# Patient Record
Sex: Female | Born: 1960 | Race: White | Hispanic: No | Marital: Single | Smoking: Former smoker
Health system: Southern US, Community
[De-identification: ages and names within clinical notes are randomized; demographics above are authoritative.]

## PROBLEM LIST (undated history)

## (undated) DIAGNOSIS — M1710 Unilateral primary osteoarthritis, unspecified knee: Secondary | ICD-10-CM

## (undated) DIAGNOSIS — F419 Anxiety disorder, unspecified: Secondary | ICD-10-CM

## (undated) DIAGNOSIS — R0902 Hypoxemia: Secondary | ICD-10-CM

## (undated) DIAGNOSIS — G4730 Sleep apnea, unspecified: Secondary | ICD-10-CM

## (undated) DIAGNOSIS — E109 Type 1 diabetes mellitus without complications: Secondary | ICD-10-CM

## (undated) DIAGNOSIS — R030 Elevated blood-pressure reading, without diagnosis of hypertension: Secondary | ICD-10-CM

## (undated) DIAGNOSIS — I10 Essential (primary) hypertension: Secondary | ICD-10-CM

## (undated) DIAGNOSIS — R011 Cardiac murmur, unspecified: Secondary | ICD-10-CM

## (undated) DIAGNOSIS — M1990 Unspecified osteoarthritis, unspecified site: Secondary | ICD-10-CM

## (undated) DIAGNOSIS — F32A Depression, unspecified: Secondary | ICD-10-CM

## (undated) DIAGNOSIS — Z780 Asymptomatic menopausal state: Secondary | ICD-10-CM

## (undated) DIAGNOSIS — F329 Major depressive disorder, single episode, unspecified: Secondary | ICD-10-CM

## (undated) DIAGNOSIS — E7800 Pure hypercholesterolemia, unspecified: Secondary | ICD-10-CM

## (undated) DIAGNOSIS — IMO0001 Reserved for inherently not codable concepts without codable children: Secondary | ICD-10-CM

## (undated) DIAGNOSIS — R638 Other symptoms and signs concerning food and fluid intake: Secondary | ICD-10-CM

## (undated) HISTORY — DX: Major depressive disorder, single episode, unspecified: F32.9

## (undated) HISTORY — DX: Cardiac murmur, unspecified: R01.1

## (undated) HISTORY — DX: Sleep apnea, unspecified: G47.30

## (undated) HISTORY — DX: Other symptoms and signs concerning food and fluid intake: R63.8

## (undated) HISTORY — DX: Unspecified osteoarthritis, unspecified site: M19.90

## (undated) HISTORY — DX: Type 1 diabetes mellitus without complications: E10.9

## (undated) HISTORY — DX: Elevated blood-pressure reading, without diagnosis of hypertension: R03.0

## (undated) HISTORY — DX: Anxiety disorder, unspecified: F41.9

## (undated) HISTORY — DX: Pure hypercholesterolemia, unspecified: E78.00

## (undated) HISTORY — DX: Essential (primary) hypertension: I10

## (undated) HISTORY — DX: Depression, unspecified: F32.A

## (undated) HISTORY — DX: Asymptomatic menopausal state: Z78.0

## (undated) HISTORY — PX: EYE SURGERY: SHX253

## (undated) HISTORY — DX: Hypoxemia: R09.02

## (undated) HISTORY — DX: Reserved for inherently not codable concepts without codable children: IMO0001

## (undated) HISTORY — DX: Unilateral primary osteoarthritis, unspecified knee: M17.10

---

## 2005-04-11 ENCOUNTER — Ambulatory Visit: Payer: Self-pay

## 2006-04-19 ENCOUNTER — Emergency Department: Payer: Self-pay | Admitting: General Practice

## 2006-04-19 ENCOUNTER — Other Ambulatory Visit: Payer: Self-pay

## 2006-04-21 ENCOUNTER — Ambulatory Visit: Payer: Self-pay | Admitting: Internal Medicine

## 2006-05-22 ENCOUNTER — Ambulatory Visit: Payer: Self-pay

## 2007-05-25 ENCOUNTER — Ambulatory Visit: Payer: Self-pay

## 2008-05-26 ENCOUNTER — Ambulatory Visit: Payer: Self-pay

## 2008-08-30 ENCOUNTER — Ambulatory Visit: Payer: Self-pay | Admitting: General Practice

## 2011-01-08 ENCOUNTER — Ambulatory Visit: Payer: Self-pay | Admitting: Internal Medicine

## 2011-07-10 ENCOUNTER — Ambulatory Visit: Payer: Self-pay | Admitting: Obstetrics and Gynecology

## 2013-01-19 ENCOUNTER — Ambulatory Visit: Payer: Self-pay | Admitting: Obstetrics and Gynecology

## 2013-03-21 ENCOUNTER — Other Ambulatory Visit: Payer: Self-pay | Admitting: Internal Medicine

## 2013-03-21 ENCOUNTER — Ambulatory Visit (INDEPENDENT_AMBULATORY_CARE_PROVIDER_SITE_OTHER): Payer: Managed Care, Other (non HMO) | Admitting: Internal Medicine

## 2013-03-21 ENCOUNTER — Encounter: Payer: Self-pay | Admitting: Internal Medicine

## 2013-03-21 VITALS — BP 132/70 | HR 84 | Temp 97.6°F | Resp 18 | Ht 63.0 in | Wt 214.2 lb

## 2013-03-21 DIAGNOSIS — E7800 Pure hypercholesterolemia, unspecified: Secondary | ICD-10-CM

## 2013-03-21 DIAGNOSIS — E109 Type 1 diabetes mellitus without complications: Secondary | ICD-10-CM

## 2013-03-21 DIAGNOSIS — F419 Anxiety disorder, unspecified: Secondary | ICD-10-CM

## 2013-03-21 DIAGNOSIS — F411 Generalized anxiety disorder: Secondary | ICD-10-CM

## 2013-03-21 DIAGNOSIS — E119 Type 2 diabetes mellitus without complications: Secondary | ICD-10-CM

## 2013-03-22 MED ORDER — LISINOPRIL 10 MG PO TABS: 10.0000 mg | ORAL_TABLET | Freq: Every day | ORAL | Status: DC

## 2013-03-25 ENCOUNTER — Encounter: Payer: Self-pay | Admitting: Internal Medicine

## 2013-03-25 ENCOUNTER — Telehealth: Payer: Self-pay | Admitting: *Deleted

## 2013-03-25 DIAGNOSIS — E109 Type 1 diabetes mellitus without complications: Secondary | ICD-10-CM | POA: Insufficient documentation

## 2013-03-25 DIAGNOSIS — F419 Anxiety disorder, unspecified: Secondary | ICD-10-CM | POA: Insufficient documentation

## 2013-03-25 DIAGNOSIS — E7800 Pure hypercholesterolemia, unspecified: Secondary | ICD-10-CM | POA: Insufficient documentation

## 2013-03-25 MED ORDER — INSULIN LISPRO 100 UNIT/ML ~~LOC~~ SOLN: SUBCUTANEOUS | Status: DC

## 2013-03-25 NOTE — Assessment & Plan Note (Signed)
Low cholesterol diet and exercise.  On no medication.  Check lipid panel.

## 2013-03-25 NOTE — Assessment & Plan Note (Signed)
Doing well.  Rarely takes xanax.  Follow.

## 2013-03-25 NOTE — Telephone Encounter (Signed)
Advised pt. Rx called to [facility]

## 2013-03-25 NOTE — Assessment & Plan Note (Addendum)
Diagnosed at age 52.  On insulin pump.  Has been seeing Dr Tedd Sias.  Wants to change.  Discussed with her today regarding need for endocrinology f/u - especially given on insulin pump.  She is not ready to make appt.  Will go ahead and refill her insulin.  Will discuss with her more the need for follow up with endocrine.  Restart enalapril.

## 2013-03-25 NOTE — Progress Notes (Signed)
  Subjective:    Patient ID: Jill Crosby, female    DOB: [birth year], 52 y.o.   MRN: [id number]  HPI 52 year old female with past history of type I diabetes and hypercholesterolemia who comes in today to follow up on these issues as well as to establish care.  She was diagnosed at age 3 with diabetes.  Uses an insulin pump.  A1c averages 6.7-6.8.  She has noticed recently that her sugars are running a little higher.  AM sugars - around 100.  Lunch 130.  Supper 100 and qhs 120-130.   Has been seeing Dr Tedd Sias for her diabetes.  Previously on Enalapril, but has stopped taking.  Had no problems with the medication.  Has high cholesterol.  Has tried lipitor and tricor.  Has had some anxiety issues in the past.  Some problems sleeping.  Went to counseling previously.  Has taken two xanax in the last two years.  Feels back to her baseline now.  Has previously had a nuclear stress test.  Was negative.  Apparently determined at that time she was suffering from anxiety.  Better now.  Continues to smoke.  Discussed with her today regarding the need to quit.  Discussed treatment options.     Past Medical History  Diagnosis Date  . Anxiety   . Type I diabetes mellitus   . Depression   . Hypercholesterolemia   . Hypertension     Review of Systems Patient denies any headache, lightheadedness or dizziness.  No increased sinus or allergy symptoms.  No chest pain, tightness or palpitations.  No increased shortness of breath, cough or congestion.  No nausea or vomiting.  No acid reflux reported.   No abdominal pain or cramping.  No bowel change, such as diarrhea, constipation, BRBPR or melana.  No urine change.  Sees Dr Greggory Keen.  Last seen 8/13.  Had pap then.  Never had abnormal pap previously.  Last mammo 01/2013.  States ok.  Has been walking on her lunch break.  Plans to do more exercise.       Objective:   Physical Exam Filed Vitals:   03/21/13 0959  BP: 132/70  Pulse: 84  Temp: 97.6 F (36.4 C)   Resp: 18   Blood pressure recheck:  158/92, pulse 69  52 year old female in no acute distress.   HEENT:  Nares- clear.  Oropharynx - without lesions. NECK:  Supple.  Nontender.  No audible bruit.  HEART:  Appears to be regular. LUNGS:  No crackles or wheezing audible.  Respirations even and unlabored.  RADIAL PULSE:  Equal bilaterally.  ABDOMEN:  Soft, nontender.  Bowel sounds present and normal.  No audible abdominal bruit.   EXTREMITIES:  No increased edema present.  DP pulses palpable and equal bilaterally.          Assessment & Plan:  CARDIOVASCULAR.  Asymptomatic.  Follow.  Previous stress test negative. Obtain results.    HEALTH MAINTENANCE.  Schedule her for a physical next visit.  Mammogram (per her report) 2/14 - ok.  Has not had colonoscopy.  Discussed the need for colonoscopy.    I spent 45 minutes with the patient and more than 50% of the time was spent in consultation regarding the above.

## 2013-03-25 NOTE — Telephone Encounter (Signed)
Called pt and advised that Dr. Lorin Picket recommended she have an endocrinologist continue to manage her insulin pump and supply refills. Advised  we would gladly refer her to a new endocrinologist of her choice or Dr. Roby Lofts recommendation. Pt declines to visit the [facility] office. Pt requested a refill of her insulin pump supplies. Advised her to contact Dr. Pricilla Handler office for a refill, she has not yet. She is unsure they will refill it due to not being seen for "awhile". Advised her to contact Dr. Pricilla Handler office to request, pt verbalized understanding. Told pt to call us back so we can proceed with referral.

## 2013-03-25 NOTE — Telephone Encounter (Signed)
Ok to refill her insulin x 1 - but will need to make sure she establishes with endocrinology

## 2013-03-25 NOTE — Telephone Encounter (Signed)
Spoke with pt again, would like a referral to Dr. Renae Fickle. Also, requesting a refill on her insulin only to [facility]. Pt states she is on her last vial, will last her 1-1.5 weeks. Will request refill on pump supplies from Dr. Renae Fickle once established.

## 2013-03-26 ENCOUNTER — Other Ambulatory Visit: Payer: Self-pay | Admitting: Internal Medicine

## 2013-03-26 MED ORDER — LISINOPRIL 10 MG PO TABS: 10.0000 mg | ORAL_TABLET | Freq: Every day | ORAL | Status: DC

## 2013-03-26 NOTE — Progress Notes (Signed)
Sent in refill for lisinopril 

## 2013-03-28 LAB — LIPID PANEL W/O CHOL/HDL RATIO
Cholesterol, Total: 259 mg/dL — ABNORMAL HIGH (ref 100–199)
LDL Calculated: 163 mg/dL — ABNORMAL HIGH (ref 0–99)
Triglycerides: 239 mg/dL — ABNORMAL HIGH (ref 0–149)
VLDL Cholesterol Cal: 48 mg/dL — ABNORMAL HIGH (ref 5–40)

## 2013-03-28 LAB — CBC WITH DIFFERENTIAL
Basophils Absolute: 0 10*3/uL (ref 0.0–0.2)
Basos: 1 % (ref 0–3)
Eos: 1 % (ref 0–5)
HCT: 40.7 % (ref 34.0–46.6)
Hemoglobin: 13.9 g/dL (ref 11.1–15.9)
Lymphs: 27 % (ref 14–46)
Monocytes: 7 % (ref 4–12)
Neutrophils Absolute: 5.3 10*3/uL (ref 1.4–7.0)
RBC: 4.6 x10E6/uL (ref 3.77–5.28)
WBC: 8.3 10*3/uL (ref 3.4–10.8)

## 2013-03-28 LAB — COMPREHENSIVE METABOLIC PANEL
Albumin: 4.1 g/dL (ref 3.5–5.5)
BUN: 8 mg/dL (ref 6–24)
Chloride: 103 mmol/L (ref 97–108)
Creatinine, Ser: 0.76 mg/dL (ref 0.57–1.00)
GFR calc Af Amer: 103 mL/min/{1.73_m2} (ref >59)
GFR calc non Af Amer: 89 mL/min/{1.73_m2} (ref >59)
Glucose: 121 mg/dL — ABNORMAL HIGH (ref 65–99)
Total Bilirubin: 0.4 mg/dL (ref 0.0–1.2)
Total Protein: 6.7 g/dL (ref 6.0–8.5)

## 2013-03-28 LAB — MICROALBUMIN / CREATININE URINE RATIO: MICROALB/CREAT RATIO: 4.2 mg/g creat (ref 0.0–30.0)

## 2013-03-29 NOTE — Addendum Note (Signed)
Addended by: Charm Barges on: 03/29/2013 05:30 PM   Modules accepted: Orders

## 2013-03-29 NOTE — Progress Notes (Signed)
Order placed for endocrinology referral.  

## 2013-05-06 ENCOUNTER — Ambulatory Visit: Payer: Managed Care, Other (non HMO) | Admitting: Internal Medicine

## 2013-07-20 ENCOUNTER — Encounter: Payer: Self-pay | Admitting: *Deleted

## 2013-08-18 ENCOUNTER — Encounter: Payer: Self-pay | Admitting: Internal Medicine

## 2013-08-18 DIAGNOSIS — Z86010 Personal history of colonic polyps: Secondary | ICD-10-CM

## 2013-09-23 ENCOUNTER — Encounter: Payer: Self-pay | Admitting: Internal Medicine

## 2013-09-23 ENCOUNTER — Ambulatory Visit (INDEPENDENT_AMBULATORY_CARE_PROVIDER_SITE_OTHER): Payer: Managed Care, Other (non HMO) | Admitting: Internal Medicine

## 2013-09-23 VITALS — BP 150/70 | HR 88 | Temp 98.6°F | Ht 63.0 in | Wt 213.5 lb

## 2013-09-23 DIAGNOSIS — E109 Type 1 diabetes mellitus without complications: Secondary | ICD-10-CM

## 2013-09-23 DIAGNOSIS — Z720 Tobacco use: Secondary | ICD-10-CM

## 2013-09-23 DIAGNOSIS — E7800 Pure hypercholesterolemia, unspecified: Secondary | ICD-10-CM

## 2013-09-23 DIAGNOSIS — F17200 Nicotine dependence, unspecified, uncomplicated: Secondary | ICD-10-CM

## 2013-09-23 DIAGNOSIS — F411 Generalized anxiety disorder: Secondary | ICD-10-CM

## 2013-09-23 DIAGNOSIS — L989 Disorder of the skin and subcutaneous tissue, unspecified: Secondary | ICD-10-CM

## 2013-09-23 DIAGNOSIS — Z23 Encounter for immunization: Secondary | ICD-10-CM

## 2013-09-23 DIAGNOSIS — F419 Anxiety disorder, unspecified: Secondary | ICD-10-CM

## 2013-09-23 MED ORDER — ALPRAZOLAM 0.25 MG PO TABS: 0.2500 mg | ORAL_TABLET | Freq: Every day | ORAL | Status: DC | PRN

## 2013-09-23 MED ORDER — BUPROPION HCL ER (XL) 150 MG PO TB24: 150.0000 mg | ORAL_TABLET | Freq: Two times a day (BID) | ORAL | Status: DC

## 2013-10-02 ENCOUNTER — Encounter: Payer: Self-pay | Admitting: Internal Medicine

## 2013-10-02 DIAGNOSIS — L989 Disorder of the skin and subcutaneous tissue, unspecified: Secondary | ICD-10-CM | POA: Insufficient documentation

## 2013-10-02 DIAGNOSIS — Z720 Tobacco use: Secondary | ICD-10-CM | POA: Insufficient documentation

## 2013-10-02 NOTE — Progress Notes (Signed)
Subjective:    Patient ID: Jill Crosby, female    DOB: Jul 03, 1961, 52 y.o.   MRN: [id number]  HPI 52 year old female with past history of type I diabetes and hypercholesterolemia who comes in today for a scheduled follow up.  She was diagnosed at age 1 with diabetes.  Uses an insulin pump.   A1c 4/14 - 8.2.  States she has f/u with Dr Renae Fickle soon.    Has high cholesterol.  Has tried lipitor and tricor.  Has had some anxiety issues in the past.  Some problems sleeping.  Went to counseling previously.   Feels back to her baseline now.  Has previously had a nuclear stress test.  Was negative.  Apparently determined at that time she was suffering from anxiety.  Better now.  Continues to smoke.  Discussed with her today regarding the need to quit.  Discussed treatment options.  States she did receive a notification from her work that if she did not quit smoking her premiums would increase.  She wants to try wellbutrin.  Discussed side effects and risk of wellbutrin.  Discussed proper way to take the medication.     Past Medical History  Diagnosis Date  . Anxiety   . Type I diabetes mellitus   . Depression   . Hypercholesterolemia     Current Outpatient Prescriptions on File Prior to Visit  Medication Sig Dispense Refill  . insulin lispro (HUMALOG) 100 UNIT/ML injection Insulin pump-use as directed  10 mL  0  . lisinopril (PRINIVIL,ZESTRIL) 10 MG tablet Take 1 tablet (10 mg total) by mouth daily.  90 tablet  0   No current facility-administered medications on file prior to visit.    Review of Systems Patient denies any headache, lightheadedness or dizziness.  No increased sinus or allergy symptoms.  No chest pain, tightness or palpitations.  No increased shortness of breath, cough or congestion.  No nausea or vomiting.  No acid reflux reported.   No abdominal pain or cramping.  No bowel change, such as diarrhea, constipation, BRBPR or melana.  No urine change.  Sees Dr Greggory Keen.  He follows  her pap smears/pelvic exams.   Never had abnormal pap previously.  Last mammo 01/2013.  States ok.  Discussed smoking cessation.  Ready to quit.  See above.  Wants to try wellbutrin.      Objective:   Physical Exam  Filed Vitals:   09/23/13 1326  BP: 150/70  Pulse: 88  Temp: 98.6 F (67 C)   52 year old female in no acute distress.   HEENT:  Nares- clear.  Oropharynx - without lesions. NECK:  Supple.  Nontender.  No audible bruit.  HEART:  Appears to be regular. LUNGS:  No crackles or wheezing audible.  Respirations even and unlabored.  RADIAL PULSE:  Equal bilaterally.  ABDOMEN:  Soft, nontender.  Bowel sounds present and normal.  No audible abdominal bruit.   EXTREMITIES:  No increased edema present.  DP pulses palpable and equal bilaterally.   FEET:  No lesions.          Assessment & Plan:  CARDIOVASCULAR.  Asymptomatic.  Follow.  Previous stress test negative. Obtain results.    HEALTH MAINTENANCE.  Schedule her for a physical next visit.  Mammogram (per her report) 2/14 - ok.  Has not had colonoscopy.  Discussed the need for colonoscopy.    I spent 25 minutes with the patient and more than 50% of the time was spent in consultation  regarding the above.

## 2013-10-02 NOTE — Assessment & Plan Note (Signed)
Discussed the need to quit.  Discussed multiple treatment options.  She wants to try wellbutrin.  No history of seizures.  Discussed risk and side effects of the medication.  Start wellbutrin as directed. Follow closely.  Get her back in soon to reassess.

## 2013-10-02 NOTE — Assessment & Plan Note (Signed)
Doing well.  Rarely takes xanax.  Follow.

## 2013-10-02 NOTE — Assessment & Plan Note (Signed)
Low cholesterol diet and exercise.  On no medication.  Follow lipid panel.  Need lab results from Dr Renae Fickle.

## 2013-10-02 NOTE — Assessment & Plan Note (Signed)
Persistent right arm lesion.  Refer to dermatology.  Wants skin survey as well.  Follow.

## 2013-10-02 NOTE — Assessment & Plan Note (Signed)
Diagnosed at age 52.  On insulin pump.  Now seeing Dr Renae Fickle.  Up to date with eye checks.  Sees Dr Druscilla Brownie.

## 2013-12-09 ENCOUNTER — Encounter: Payer: Managed Care, Other (non HMO) | Admitting: Internal Medicine

## 2013-12-16 ENCOUNTER — Ambulatory Visit: Payer: Self-pay | Admitting: Unknown Physician Specialty

## 2013-12-16 LAB — HM COLONOSCOPY

## 2013-12-20 LAB — PATHOLOGY REPORT

## 2014-01-07 DIAGNOSIS — Z860100 Personal history of colon polyps, unspecified: Secondary | ICD-10-CM | POA: Insufficient documentation

## 2014-01-30 ENCOUNTER — Encounter: Payer: Self-pay | Admitting: Internal Medicine

## 2014-08-29 DIAGNOSIS — E538 Deficiency of other specified B group vitamins: Secondary | ICD-10-CM | POA: Insufficient documentation

## 2014-09-28 LAB — HM PAP SMEAR: HM PAP: NEGATIVE

## 2015-06-25 ENCOUNTER — Other Ambulatory Visit: Payer: Self-pay | Admitting: Obstetrics and Gynecology

## 2015-06-25 DIAGNOSIS — Z1231 Encounter for screening mammogram for malignant neoplasm of breast: Secondary | ICD-10-CM

## 2015-06-27 ENCOUNTER — Ambulatory Visit
Admission: RE | Admit: 2015-06-27 | Discharge: 2015-06-27 | Disposition: A | Discharge status: Home / Self Care | Payer: Managed Care, Other (non HMO) | Source: Ambulatory Visit | Attending: Obstetrics and Gynecology | Admitting: Obstetrics and Gynecology

## 2015-06-27 DIAGNOSIS — Z1231 Encounter for screening mammogram for malignant neoplasm of breast: Secondary | ICD-10-CM

## 2015-07-20 LAB — HM DIABETES EYE EXAM

## 2015-08-31 ENCOUNTER — Encounter: Payer: Self-pay | Admitting: Internal Medicine

## 2015-08-31 ENCOUNTER — Ambulatory Visit (INDEPENDENT_AMBULATORY_CARE_PROVIDER_SITE_OTHER): Payer: Managed Care, Other (non HMO) | Admitting: Internal Medicine

## 2015-08-31 VITALS — BP 138/70 | HR 80 | Temp 98.1°F | Resp 18 | Ht 63.0 in | Wt 222.0 lb

## 2015-08-31 DIAGNOSIS — F419 Anxiety disorder, unspecified: Secondary | ICD-10-CM

## 2015-08-31 DIAGNOSIS — E7800 Pure hypercholesterolemia, unspecified: Secondary | ICD-10-CM

## 2015-08-31 DIAGNOSIS — E109 Type 1 diabetes mellitus without complications: Secondary | ICD-10-CM

## 2015-08-31 DIAGNOSIS — Z720 Tobacco use: Secondary | ICD-10-CM

## 2015-08-31 DIAGNOSIS — Z86010 Personal history of colonic polyps: Secondary | ICD-10-CM

## 2015-08-31 DIAGNOSIS — Z23 Encounter for immunization: Secondary | ICD-10-CM

## 2015-08-31 MED ORDER — LISINOPRIL 10 MG PO TABS: 10.0000 mg | ORAL_TABLET | Freq: Every day | ORAL | Status: DC

## 2015-08-31 MED ORDER — INSULIN GLARGINE 100 UNIT/ML SOLOSTAR PEN: PEN_INJECTOR | SUBCUTANEOUS | Status: DC

## 2015-08-31 MED ORDER — INSULIN LISPRO 100 UNIT/ML ~~LOC~~ SOLN: SUBCUTANEOUS | Status: DC

## 2015-08-31 NOTE — Patient Instructions (Signed)

## 2015-08-31 NOTE — Progress Notes (Signed)
Patient ID: Jill Crosby, female   DOB: 09/26/1961, 54 y.o.   MRN: [id number]   Subjective:    Patient ID: Jill Crosby, female    DOB: 1960-12-16, 54 y.o.   MRN: [id number]  HPI  Patient with past history of diabetes and hypercholesterolemia who comes in today to follow up on these issues.  I have not seen her since 09/23/13.  She is not using an insulin pump now.  Takes lantus 50 units before bed.  Using quickpen for short acting insulin.  She brought in no recorded sugar readings.  States fasting sugars are averaging 110-120 and midday sugars averaging 150.  Sugars in the evening 200.  We discussed diet and exercise. Discussed importance of eating regular meals and appropriate snacks.  No cardiac symptoms with increased activity or exertion.  No sob.  No abdominal pain or cramping.  No bowel change.     Past Medical History  Diagnosis Date  . Anxiety   . Type I diabetes mellitus ([place])   . Depression   . Hypercholesterolemia    Past Surgical History  Procedure Laterality Date  . Cesarean section  1997   Family History  Problem Relation Age of Onset  . Hypertension Mother   . Hyperlipidemia Mother   . Cancer Father     lung  . Colon cancer Maternal Grandfather   . Colon polyps Mother    Social History   Social History  . Marital Status: Single    Spouse Name: N/A  . Number of Children: N/A  . Years of Education: N/A   Social History Main Topics  . Smoking status: Current Every Day Smoker -- 1.00 packs/day for 20 years    Types: Cigarettes  . Smokeless tobacco: Never Used  . Alcohol Use: No  . Drug Use: No  . Sexual Activity: Not Asked   Other Topics Concern  . None   Social History Narrative    Outpatient Encounter Prescriptions as of 08/31/2015  Medication Sig  . cetirizine (ZYRTEC) 10 MG tablet Take by mouth.  . Insulin Glargine (LANTUS SOLOSTAR) 100 UNIT/ML Solostar Pen Inject subcutaneously 55  units nightly  . insulin lispro (HUMALOG) 100 UNIT/ML  injection Insulin pump-use as directed  . lisinopril (PRINIVIL,ZESTRIL) 10 MG tablet Take 1 tablet (10 mg total) by mouth daily.  . [DISCONTINUED] ALPRAZolam (XANAX) 0.25 MG tablet Take 1 tablet (0.25 mg total) by mouth daily as needed.  . [DISCONTINUED] buPROPion (WELLBUTRIN XL) 150 MG 24 hr tablet Take 1 tablet (150 mg total) by mouth 2 (two) times daily.  . [DISCONTINUED] Insulin Glargine (LANTUS SOLOSTAR) 100 UNIT/ML Solostar Pen Inject subcutaneously 55  units nightly  . [DISCONTINUED] insulin lispro (HUMALOG) 100 UNIT/ML injection Insulin pump-use as directed  . [DISCONTINUED] lisinopril (PRINIVIL,ZESTRIL) 10 MG tablet Take 1 tablet (10 mg total) by mouth daily.   No facility-administered encounter medications on file as of 08/31/2015.    Review of Systems  Constitutional: Negative for appetite change and unexpected weight change.  HENT: Negative for congestion and sinus pressure.   Eyes: Negative for pain and visual disturbance.  Respiratory: Negative for cough, chest tightness and shortness of breath.   Cardiovascular: Negative for chest pain, palpitations and leg swelling.  Gastrointestinal: Negative for nausea, vomiting, abdominal pain and diarrhea.  Genitourinary: Negative for dysuria and difficulty urinating.  Musculoskeletal: Negative for back pain and joint swelling.  Skin: Negative for color change and rash.  Neurological: Negative for dizziness, light-headedness and headaches.  Psychiatric/Behavioral: Negative  for dysphoric mood and agitation.       Objective:    Physical Exam  Constitutional: She appears well-developed and well-nourished. No distress.  HENT:  Nose: Nose normal.  Mouth/Throat: Oropharynx is clear and moist.  Eyes: Conjunctivae are normal. Right eye exhibits no discharge. Left eye exhibits no discharge.  Neck: Neck supple. No thyromegaly present.  Cardiovascular: Normal rate and regular rhythm.   Pulmonary/Chest: Breath sounds normal. No respiratory  distress. She has no wheezes.  Abdominal: Soft. Bowel sounds are normal. There is no tenderness.  Musculoskeletal: She exhibits no edema or tenderness.  Lymphadenopathy:    She has no cervical adenopathy.  Skin: No rash noted. No erythema.  Psychiatric: She has a normal mood and affect. Her behavior is normal.    BP 138/70 mmHg  Pulse 80  Temp(Src) 98.1 F (36.7 C) (Oral)  Resp 18  Ht _0  (1.6 m)  Wt 222 lb (100.699 kg)  BMI 39.34 kg/m2  SpO2 97% Wt Readings from Last 3 Encounters:  08/31/15 222 lb (100.699 kg)  09/23/13 213 lb 8 oz (96.843 kg)  03/21/13 214 lb 4 oz (97.183 kg)     Lab Results  Component Value Date   WBC 8.3 03/21/2013   HGB 13.9 03/21/2013   HCT 40.7 03/21/2013   PLT 219 03/21/2013   GLUCOSE 121* 03/21/2013   CHOL 259* 03/21/2013   TRIG 239* 03/21/2013   HDL 48 03/21/2013   LDLCALC 163* 03/21/2013   ALT 11 03/21/2013   AST 10 03/21/2013   NA 140 03/21/2013   K 4.1 03/21/2013   CL 103 03/21/2013   CREATININE 0.76 03/21/2013   BUN 8 03/21/2013   CO2 24 03/21/2013   TSH 1.350 03/21/2013   HGBA1C 8.2* 03/21/2013    Mm Digital Screening Bilateral  06/28/2015   CLINICAL DATA:  Screening.  EXAM: DIGITAL SCREENING BILATERAL MAMMOGRAM WITH CAD  COMPARISON:  Previous exam(s).  ACR Breast Density Category b: There are scattered areas of fibroglandular density.  FINDINGS: There are no findings suspicious for malignancy. Images were processed with CAD.  IMPRESSION: No mammographic evidence of malignancy. A result letter of this screening mammogram will be mailed directly to the patient.  RECOMMENDATION: Screening mammogram in one year. (Code:SM-B-01Y)  BI-RADS CATEGORY  1: Negative.   Electronically Signed   By: Nolon Nations M.D.   On: 06/28/2015 11:53       Assessment & Plan:   Problem List Items Addressed This Visit    Anxiety    Appears to be stable.  Follow.        Hypercholesterolemia    Low cholesterol diet and exercise.  Follow lipid  panel.        Relevant Medications   lisinopril (PRINIVIL,ZESTRIL) 10 MG tablet   Personal history of colonic polyps    Colonoscopy 12/16/13 - hyperplastic polyp.  Recommended f/u colonoscopy in 12/2018.        Tobacco use    Needs to quit smoking.  Follow.        Type 1 diabetes mellitus ([facility])    Not using insulin pump now.  No longer seeing Dr Eddie Dibbles.  Taking lantus 50 units in the evening.  Uses short acting quickpen with meals.  Discussed low carb diet and exercise.  Follow met b and a1c.  States had eye exam within the last 1-2 months.  No diabetes changes.  Follow.        Relevant Medications   lisinopril (PRINIVIL,ZESTRIL) 10 MG tablet  insulin lispro (HUMALOG) 100 UNIT/ML injection   Insulin Glargine (LANTUS SOLOSTAR) 100 UNIT/ML Solostar Pen    Other Visit Diagnoses    Encounter for immunization    -  Primary        Einar Pheasant, MD

## 2015-08-31 NOTE — Progress Notes (Signed)
Pre-visit discussion using our clinic review tool. No additional management support is needed unless otherwise documented below in the visit note.  

## 2015-09-05 ENCOUNTER — Telehealth: Payer: Self-pay | Admitting: *Deleted

## 2015-09-05 NOTE — Telephone Encounter (Signed)
Left patient a voicemail on her cell to notify her that her pharmacy has tried to contact her about her Humalog. Need to know if she is using the pen or vial & need to know the dose & quantity she is using.

## 2015-09-06 ENCOUNTER — Telehealth: Payer: Self-pay

## 2015-09-06 NOTE — Telephone Encounter (Signed)
Called [facility] and verbal order given per request of patient.

## 2015-09-06 NOTE — Telephone Encounter (Signed)
[facility] called to clarify Humalog prescription that was sen ton 9/30.  Prescription states, use insulin pump as directed.  Patient hasn't used a pump in years.  Told Pharmacy that she uses Humalog Quick Pen 50units per day.  Can I call that in, if you agree.  Thanks

## 2015-09-06 NOTE — Telephone Encounter (Signed)
Yes, she explained this to [place] when she was here and I thought the correct form was sent in.  Please clarify with pt exactly what she has been using and dose taking and send in rx.  Sorry for the mix up.

## 2015-09-08 ENCOUNTER — Encounter: Payer: Self-pay | Admitting: Internal Medicine

## 2015-09-09 NOTE — Assessment & Plan Note (Signed)
Needs to quit smoking.  Follow  

## 2015-09-09 NOTE — Assessment & Plan Note (Signed)
Appears to be stable.  Follow.   

## 2015-09-09 NOTE — Assessment & Plan Note (Signed)
Not using insulin pump now.  No longer seeing Dr Eddie Dibbles.  Taking lantus 50 units in the evening.  Uses short acting quickpen with meals.  Discussed low carb diet and exercise.  Follow met b and a1c.  States had eye exam within the last 1-2 months.  No diabetes changes.  Follow.

## 2015-09-09 NOTE — Assessment & Plan Note (Signed)
Low cholesterol diet and exercise.  Follow lipid panel.   

## 2015-09-09 NOTE — Assessment & Plan Note (Signed)
Colonoscopy 12/16/13 - hyperplastic polyp.  Recommended f/u colonoscopy in 12/2018.

## 2015-09-12 ENCOUNTER — Other Ambulatory Visit: Payer: Self-pay | Admitting: Internal Medicine

## 2015-09-13 ENCOUNTER — Other Ambulatory Visit: Payer: Self-pay | Admitting: *Deleted

## 2015-09-13 MED ORDER — LANCETS MISC: Status: DC

## 2015-09-13 MED ORDER — GLUCOSE BLOOD VI STRP: ORAL_STRIP | Status: DC

## 2015-09-14 NOTE — Telephone Encounter (Signed)
Pt last had an A1c in 2014, OV 12.30.16. Please advise refill

## 2015-09-14 NOTE — Telephone Encounter (Signed)
This was just refilled 08/2015.  This is also the patient you were dealing with regarding her short acting insulin.  Please confirm rx correct.  Thanks

## 2015-09-20 ENCOUNTER — Other Ambulatory Visit: Payer: Self-pay | Admitting: *Deleted

## 2015-09-20 MED ORDER — INSULIN GLARGINE 100 UNIT/ML SOLOSTAR PEN: PEN_INJECTOR | SUBCUTANEOUS | Status: DC

## 2015-10-04 ENCOUNTER — Encounter: Payer: Self-pay | Admitting: Obstetrics and Gynecology

## 2015-10-04 ENCOUNTER — Ambulatory Visit (INDEPENDENT_AMBULATORY_CARE_PROVIDER_SITE_OTHER): Payer: Managed Care, Other (non HMO) | Admitting: Obstetrics and Gynecology

## 2015-10-04 VITALS — BP 179/82 | HR 85 | Ht 64.0 in | Wt 219.4 lb

## 2015-10-04 DIAGNOSIS — Z0000 Encounter for general adult medical examination without abnormal findings: Secondary | ICD-10-CM

## 2015-10-04 DIAGNOSIS — N952 Postmenopausal atrophic vaginitis: Secondary | ICD-10-CM | POA: Diagnosis not present

## 2015-10-04 DIAGNOSIS — Z1211 Encounter for screening for malignant neoplasm of colon: Secondary | ICD-10-CM | POA: Diagnosis not present

## 2015-10-04 DIAGNOSIS — E669 Obesity, unspecified: Secondary | ICD-10-CM | POA: Diagnosis not present

## 2015-10-04 DIAGNOSIS — Z01419 Encounter for gynecological examination (general) (routine) without abnormal findings: Secondary | ICD-10-CM

## 2015-10-04 NOTE — Progress Notes (Signed)
Patient ID: Irven Shelling, female   DOB: [birth year], 54 y.o.   MRN: [id number] ANNUAL PREVENTATIVE CARE GYN  ENCOUNTER NOTE  Subjective:       Jill Crosby is a 54 y.o. [phone] female here for a routine annual gynecologic exam.  Current complaints: 1.  Want xanax for anxiety- tried Effexor for 1 year- helped 2.  Annual exam - Denies vasomotor symptoms, urinary symptoms, change in BM, not sexually active. Had normal pap+HPV last year, normal mammogram this year, normal colonoscopy 2014. Labs are managed by PCP.   Gynecologic History No LMP recorded. Patient is postmenopausal. Contraception: post menopausal status. No HRT, no vasomotor symptoms. Last Pap: 10/30/205 neg/neg. Results were: normal Last mammogram: 07/2015 . Results were: normal Not sexually active  Obstetric History OB History  Gravida Para Term Preterm AB SAB TAB Ectopic Multiple Living  # Outcome Date GA Lbr Len/2nd Weight Sex Delivery Anes PTL Lv  5 Term 1997   9 lb 1.9 oz (4.137 kg) M CS-LVertical   Y  4 SAB 1993          3 Term 1989   7 lb 14.4 oz (3.583 kg) M Vag-Spont   Y  2 SAB 1987          1 Preterm 1978        FD      Past Medical History  Diagnosis Date  . Anxiety   . Type I diabetes mellitus (HCC)   . Depression   . Hypercholesterolemia   . Elevated BP   . Menopause   . Increased BMI     Past Surgical History  Procedure Laterality Date  . Cesarean section  1997    Current Outpatient Prescriptions on File Prior to Visit  Medication Sig Dispense Refill  . cetirizine (ZYRTEC) 10 MG tablet Take by mouth.    Marland Kitchen glucose blood (BAYER CONTOUR NEXT TEST) test strip Use as instructed 100 each 12  . Insulin Glargine (LANTUS SOLOSTAR) 100 UNIT/ML Solostar Pen Inject subcutaneously 55  units nightly 45 mL 3  . insulin lispro (HUMALOG) 100 UNIT/ML injection Insulin pump-use as directed 10 mL 1  . Lancets MISC Use as directed 100 each 3  . lisinopril (PRINIVIL,ZESTRIL) 10 MG tablet  Take 1 tablet (10 mg total) by mouth daily. 90 tablet 1   No current facility-administered medications on file prior to visit.    Allergies  Allergen Reactions  . Codeine     Social History   Social History  . Marital Status: Single    Spouse Name: N/A  . Number of Children: N/A  . Years of Education: N/A   Occupational History  . Not on file.   Social History Main Topics  . Smoking status: Current Every Day Smoker -- 0.50 packs/day for 20 years    Types: Cigarettes  . Smokeless tobacco: Never Used  . Alcohol Use: No  . Drug Use: No  . Sexual Activity: Not Currently    Birth Control/ Protection: Post-menopausal   Other Topics Concern  . Not on file   Social History Narrative    Family History  Problem Relation Age of Onset  . Hypertension Mother   . Hyperlipidemia Mother   . Colon polyps Mother   . Cancer Father     lung  . Colon cancer Maternal Grandfather   . Diabetes Maternal Grandfather   . Breast cancer Maternal Grandmother   .  Colon cancer Paternal Grandfather   . Heart disease Neg Hx   . Ovarian cancer Neg Hx     The following portions of the patient's history were reviewed and updated as appropriate: allergies, current medications, past family history, past medical history, past social history, past surgical history and problem list.  Review of Systems ROS Review of Systems - General ROS: negative for - chills, fatigue, fever, hot flashes, night sweats. Positive for weight loss (intentional, 3lb) Psychological ROS: negative for - anxiety, decreased libido, depression, mood swings Ophthalmic ROS: negative for - blurry vision ENT ROS: negative for - headaches, visual changes  Endocrine ROS: negative for - galactorrhea, hair pattern changes, hot flashes, malaise/lethargy Breast ROS: negative for - new or changing breast lumps or nipple discharge Respiratory ROS: negative for - cough or shortness of breath Cardiovascular ROS: negative for - chest  pain, irregular heartbeat, palpitations or shortness of breath Gastrointestinal ROS: no abdominal pain, change in bowel habits, or black or bloody stools Genito-Urinary ROS: no dysuria, trouble voiding, or hematuria   Objective:   BP 179/82 mmHg  Pulse 85  Ht 5\' 4"  (1.626 m)  Wt 219 lb 6.4 oz (99.519 kg)  BMI 37.64 kg/m2 CONSTITUTIONAL: Well-developed, well-nourished female in no acute distress.  PSYCHIATRIC: Normal mood and affect. Normal behavior.  NEUROLGIC: Alert and oriented to person, place, and time. Normal muscle tone coordination. No cranial nerve deficit noted. HENT:  Normocephalic, atraumatic EYES: Conjunctivae and EOM are normal. NECK: Normal range of motion, supple, no masses.  Normal thyroid.  SKIN: Skin is warm and dry. Not diaphoretic. Eczema patches noted on the abdomen. CARDIOVASCULAR: S1S2, 1/6 systolic murmur, occasional missed beats RESPIRATORY: Clear to auscultation bilaterally.  BREASTS: Symmetric in size. No masses, skin changes, nipple drainage, or lymphadenopathy. ABDOMEN: Soft, normal bowel sounds, no distention noted.  No tenderness, rebound or guarding.  BLADDER: Normal PELVIC:  External Genitalia: Skin colored fleshy lesion right buttock, nevus vs. acrochordon  BUS: Normal  Vagina: Mild decreased estrogen effect  Cervix: Normal, no cervical motion tenderness  Uterus: Midline, not enlarged  Adnexa: No masses palpated  RV: External Exam NormaI, No Rectal Masses and Normal Sphincter tone  MUSCULOSKELETAL: Normal range of motion. No tenderness.  No cyanosis, clubbing, or edema.  2+ distal pulses. LYMPHATIC: No Axillary, Supraclavicular, or Inguinal Adenopathy.    Assessment:   Annual gynecologic examination 54 y.o. Contraception: post menopausal status, without vasomotor symptoms. Never been on HRT. Obesity 2 Hx of ovarian cyst Mild vaginal atrophy  Plan:  Pap: Not needed, negative/negative last year Mammogram: utd, normal 2016 Stool Guaiac  Testing:  Ordered, normal colonoscopy 2014 Labs: thur pcp Dr. Lorin PicketScott Routine preventative health maintenance measures emphasized: Exercise/Diet/Weight control, Tobacco Warnings and Alcohol/Substance use risks  Patient has no vaginal or urinary symptoms from mild vaginal atrophy; no treatment indicated at this time  Return to Clinic - [address]   Crystal Forty FortMiller, CMA  Doreene NestElena Klaus, PA-S Herold HarmsMartin A Defrancesco, MD    I have seen, interviewed, and examined the patient in conjunction with the [employer] and affirm the diagnosis and management plan. Martin A. DeFrancesco, MD, Evern CoreFACOG    '

## 2015-10-04 NOTE — Patient Instructions (Signed)
1. Pelvic exam today was normal, will recheck in 1 year 2. Continue healthy eating habits and increasing exercise 3. Take vitamin D/Calcium supplements 1200mg /day for bone health 4. Follow up with your primary doctor for regular yearly labs 5. Avoid tobacco products, and minimize alcohol  Follow up in 1 year

## 2015-11-30 ENCOUNTER — Encounter: Payer: Managed Care, Other (non HMO) | Admitting: Internal Medicine

## 2015-12-21 ENCOUNTER — Other Ambulatory Visit: Payer: Self-pay | Admitting: Internal Medicine

## 2015-12-22 LAB — CBC WITH DIFFERENTIAL/PLATELET
BASOS ABS: 0.1 10*3/uL (ref 0.0–0.2)
BASOS: 1 %
EOS (ABSOLUTE): 0.3 10*3/uL (ref 0.0–0.4)
Eos: 3 %
Hematocrit: 44.2 % (ref 34.0–46.6)
Hemoglobin: 14.7 g/dL (ref 11.1–15.9)
IMMATURE GRANS (ABS): 0 10*3/uL (ref 0.0–0.1)
Immature Granulocytes: 0 %
LYMPHS: 31 %
Lymphocytes Absolute: 3 10*3/uL (ref 0.7–3.1)
MCH: 29.3 pg (ref 26.6–33.0)
MCHC: 33.3 g/dL (ref 31.5–35.7)
MCV: 88 fL (ref 79–97)
MONOS ABS: 0.8 10*3/uL (ref 0.1–0.9)
Monocytes: 8 %
NEUTROS ABS: 5.6 10*3/uL (ref 1.4–7.0)
Neutrophils: 57 %
PLATELETS: 204 10*3/uL (ref 150–379)
RBC: 5.01 x10E6/uL (ref 3.77–5.28)
RDW: 13.7 % (ref 12.3–15.4)
WBC: 9.8 10*3/uL (ref 3.4–10.8)

## 2015-12-22 LAB — HEPATIC FUNCTION PANEL
ALT: 11 IU/L (ref 0–32)
AST: 17 IU/L (ref 0–40)
Albumin: 4.5 g/dL (ref 3.5–5.5)
Alkaline Phosphatase: 66 IU/L (ref 39–117)
Bilirubin Total: 0.5 mg/dL (ref 0.0–1.2)
Bilirubin, Direct: 0.11 mg/dL (ref 0.00–0.40)
Total Protein: 7 g/dL (ref 6.0–8.5)

## 2015-12-22 LAB — BASIC METABOLIC PANEL
BUN/Creatinine Ratio: 13 (ref 9–23)
BUN: 11 mg/dL (ref 6–24)
CO2: 23 mmol/L (ref 18–29)
Calcium: 9.3 mg/dL (ref 8.7–10.2)
Chloride: 105 mmol/L (ref 96–106)
Creatinine, Ser: 0.85 mg/dL (ref 0.57–1.00)
GFR calc Af Amer: 89 mL/min/{1.73_m2} (ref >59)
GFR calc non Af Amer: 77 mL/min/{1.73_m2} (ref >59)
Glucose: 60 mg/dL — ABNORMAL LOW (ref 65–99)
Potassium: 3.8 mmol/L (ref 3.5–5.2)
Sodium: 145 mmol/L — ABNORMAL HIGH (ref 134–144)

## 2015-12-22 LAB — LIPID PANEL W/O CHOL/HDL RATIO
Cholesterol, Total: 260 mg/dL — ABNORMAL HIGH (ref 100–199)
HDL: 50 mg/dL (ref >39)
LDL Calculated: 175 mg/dL — ABNORMAL HIGH (ref 0–99)
Triglycerides: 174 mg/dL — ABNORMAL HIGH (ref 0–149)
VLDL Cholesterol Cal: 35 mg/dL (ref 5–40)

## 2015-12-22 LAB — TSH: TSH: 2.7 u[IU]/mL (ref 0.450–4.500)

## 2015-12-22 LAB — VITAMIN B12: VITAMIN B 12: 389 pg/mL (ref 211–946)

## 2015-12-22 LAB — MICROALBUMIN / CREATININE URINE RATIO
Creatinine, Urine: 49.9 mg/dL
MICROALB/CREAT RATIO: 18.8 mg/g{creat} (ref 0.0–30.0)
Microalbumin, Urine: 9.4 ug/mL

## 2015-12-22 LAB — HGB A1C W/O EAG: Hgb A1c MFr Bld: 7.6 % — ABNORMAL HIGH (ref 4.8–5.6)

## 2015-12-28 ENCOUNTER — Ambulatory Visit (INDEPENDENT_AMBULATORY_CARE_PROVIDER_SITE_OTHER): Payer: Managed Care, Other (non HMO) | Admitting: Internal Medicine

## 2015-12-28 ENCOUNTER — Encounter: Payer: Self-pay | Admitting: Internal Medicine

## 2015-12-28 VITALS — BP 128/80 | HR 74 | Temp 98.0°F | Resp 18 | Ht 64.0 in | Wt 221.2 lb

## 2015-12-28 DIAGNOSIS — Z860100 Personal history of colon polyps, unspecified: Secondary | ICD-10-CM

## 2015-12-28 DIAGNOSIS — E7800 Pure hypercholesterolemia, unspecified: Secondary | ICD-10-CM

## 2015-12-28 DIAGNOSIS — E109 Type 1 diabetes mellitus without complications: Secondary | ICD-10-CM

## 2015-12-28 DIAGNOSIS — Z0000 Encounter for general adult medical examination without abnormal findings: Secondary | ICD-10-CM

## 2015-12-28 DIAGNOSIS — F419 Anxiety disorder, unspecified: Secondary | ICD-10-CM

## 2015-12-28 DIAGNOSIS — Z720 Tobacco use: Secondary | ICD-10-CM | POA: Diagnosis not present

## 2015-12-28 DIAGNOSIS — I10 Essential (primary) hypertension: Secondary | ICD-10-CM

## 2015-12-28 MED ORDER — ALPRAZOLAM 0.25 MG PO TABS: 0.2500 mg | ORAL_TABLET | Freq: Every day | ORAL | Status: DC | PRN

## 2015-12-28 MED ORDER — LISINOPRIL 20 MG PO TABS: 20.0000 mg | ORAL_TABLET | Freq: Every day | ORAL | Status: DC

## 2015-12-28 NOTE — Progress Notes (Signed)
Pre-visit discussion using our clinic review tool. No additional management support is needed unless otherwise documented below in the visit note.  

## 2015-12-28 NOTE — Patient Instructions (Signed)
welchol - for cholesterol

## 2015-12-28 NOTE — Progress Notes (Signed)
Patient ID: Hessie Knows, female   DOB: 07-08-1961, 55 y.o.   MRN: [id number]   Subjective:    Patient ID: Hessie Knows, female    DOB: 10-15-1961, 55 y.o.   MRN: [id number]  HPI  Patient with past history of hypercholesterolemia, diabetes, hypertension and anxiety.  She comes in today to follow up on these issues as well as for a complete physical exam.  Her breast exams and pelvic exams and pap smears are performed by Dr Enzo Bi.   She is off insulin pump and wishes not to restart.  Giving herself lantus daily and short acting insulin for the three meals.  Her recent a1c has improved from our last recorded a1c.  a1c 7.6.  States sugars in am 90-130 and pm sugars before dinner - 180.  Did not bring in any recorded sugar readings.  She does give herself correction boluses.  No chest pain or tightness.  Breathing stable.  No abdominal pain or cramping.  Bowels stable.  Discussed her cholesterol.  Is elevated.  She had increased LFTs with multiple statin medications.  Discussed other treatment options.  Some increased stress.  Wants to have another rx for xanax.  Rarely takes.     Past Medical History  Diagnosis Date  . Anxiety   . Type I diabetes mellitus ([place])   . Depression   . Hypercholesterolemia   . Elevated BP   . Menopause   . Increased BMI    Past Surgical History  Procedure Laterality Date  . Cesarean section  1997   Family History  Problem Relation Age of Onset  . Hypertension Mother   . Hyperlipidemia Mother   . Colon polyps Mother   . Cancer Father     lung  . Colon cancer Maternal Grandfather   . Diabetes Maternal Grandfather   . Breast cancer Maternal Grandmother   . Colon cancer Paternal Grandfather   . Heart disease Neg Hx   . Ovarian cancer Neg Hx    Social History   Social History  . Marital Status: Single    Spouse Name: N/A  . Number of Children: N/A  . Years of Education: N/A   Social History Main Topics  . Smoking status: Current Every Day  Smoker -- 0.50 packs/day for 20 years    Types: Cigarettes  . Smokeless tobacco: Never Used  . Alcohol Use: No  . Drug Use: No  . Sexual Activity: Not Currently    Birth Control/ Protection: Post-menopausal   Other Topics Concern  . None   Social History Narrative    Outpatient Encounter Prescriptions as of 12/28/2015  Medication Sig  . cetirizine (ZYRTEC) 10 MG tablet Take by mouth.  Marland Kitchen glucose blood (BAYER CONTOUR NEXT TEST) test strip Use as instructed  . Insulin Glargine (LANTUS SOLOSTAR) 100 UNIT/ML Solostar Pen Inject subcutaneously 55  units nightly  . insulin lispro (HUMALOG) 100 UNIT/ML injection Insulin pump-use as directed  . Lancets MISC Use as directed  . [DISCONTINUED] lisinopril (PRINIVIL,ZESTRIL) 10 MG tablet Take 1 tablet (10 mg total) by mouth daily.  Marland Kitchen ALPRAZolam (XANAX) 0.25 MG tablet Take 1 tablet (0.25 mg total) by mouth daily as needed for anxiety.  Marland Kitchen lisinopril (PRINIVIL,ZESTRIL) 20 MG tablet Take 1 tablet (20 mg total) by mouth daily.   No facility-administered encounter medications on file as of 12/28/2015.    Review of Systems  Constitutional: Negative for appetite change and unexpected weight change.  HENT: Negative for congestion and sinus pressure.  Eyes: Negative for pain and visual disturbance.  Respiratory: Negative for cough, chest tightness and shortness of breath.   Cardiovascular: Negative for chest pain, palpitations and leg swelling.  Gastrointestinal: Negative for nausea, vomiting, abdominal pain and diarrhea.  Genitourinary: Negative for dysuria and difficulty urinating.  Musculoskeletal: Negative for back pain and joint swelling.  Skin: Negative for color change and rash.  Neurological: Negative for dizziness, light-headedness and headaches.  Hematological: Negative for adenopathy. Does not bruise/bleed easily.  Psychiatric/Behavioral: Negative for dysphoric mood and agitation.       Objective:     Blood pressure rechecked by me:   150/78  Physical Exam  Constitutional: She appears well-developed and well-nourished. No distress.  HENT:  Nose: Nose normal.  Mouth/Throat: Oropharynx is clear and moist.  Eyes: Conjunctivae are normal. Right eye exhibits no discharge. Left eye exhibits no discharge.  Neck: Neck supple. No thyromegaly present.  Cardiovascular: Normal rate and regular rhythm.   Pulmonary/Chest: Breath sounds normal. No respiratory distress. She has no wheezes.  Abdominal: Soft. Bowel sounds are normal. There is no tenderness.  Musculoskeletal: She exhibits no edema or tenderness.  Lymphadenopathy:    She has no cervical adenopathy.  Skin: No rash noted. No erythema.  Psychiatric: She has a normal mood and affect. Her behavior is normal.    BP 128/80 mmHg  Pulse 74  Temp(Src) 98 F (36.7 C) (Oral)  Resp 18  Ht '5\' 4"'  (1.626 m)  Wt 221 lb 4 oz (100.358 kg)  BMI 37.96 kg/m2  SpO2 95% Wt Readings from Last 3 Encounters:  12/28/15 221 lb 4 oz (100.358 kg)  10/04/15 219 lb 6.4 oz (99.519 kg)  08/31/15 222 lb (100.699 kg)     Lab Results  Component Value Date   WBC 9.8 12/21/2015   HGB 13.9 03/21/2013   HCT 44.2 12/21/2015   PLT 204 12/21/2015   GLUCOSE 60* 12/21/2015   CHOL 260* 12/21/2015   TRIG 174* 12/21/2015   HDL 50 12/21/2015   LDLCALC 175* 12/21/2015   ALT 11 12/21/2015   AST 17 12/21/2015   NA 145* 12/21/2015   K 3.8 12/21/2015   CL 105 12/21/2015   CREATININE 0.85 12/21/2015   BUN 11 12/21/2015   CO2 23 12/21/2015   TSH 2.700 12/21/2015   HGBA1C 7.6* 12/21/2015    Mm Digital Screening Bilateral  06/28/2015  CLINICAL DATA:  Screening. EXAM: DIGITAL SCREENING BILATERAL MAMMOGRAM WITH CAD COMPARISON:  Previous exam(s). ACR Breast Density Category b: There are scattered areas of fibroglandular density. FINDINGS: There are no findings suspicious for malignancy. Images were processed with CAD. IMPRESSION: No mammographic evidence of malignancy. A result letter of this screening  mammogram will be mailed directly to the patient. RECOMMENDATION: Screening mammogram in one year. (Code:SM-B-01Y) BI-RADS CATEGORY  1: Negative. Electronically Signed   By: Nolon Nations M.D.   On: 06/28/2015 11:53       Assessment & Plan:   Problem List Items Addressed This Visit    Anxiety    Overall stable.  Refilled xanax.  Rarely uses.        Relevant Medications   ALPRAZolam (XANAX) 0.25 MG tablet   Essential hypertension    Blood pressure as outlined.  On lisinopril.  Increase to 83m q day.  Follow pressures.  Get her back in soon to reassess.   Follow metabolic panel.       Relevant Medications   lisinopril (PRINIVIL,ZESTRIL) 20 MG tablet   Health care maintenance  Physical today 12/28/15.  Mammogram 06/28/15 - Birads I.  Colonoscopy 2014.  PAP 2015 - Dr DeFrancesco - negative with negative HPV.  Dr Enzo Bi - performs her breast, pelvic and pap smears.        Hypercholesterolemia    Low cholesterol diet and exercise.  Discussed treatment.  Had increased LFTs with previous statin medications.  Discussed welchol.  She wants to check her insurance.  Follow lipid panel.        Relevant Medications   lisinopril (PRINIVIL,ZESTRIL) 20 MG tablet   Personal history of colonic polyps    Colonoscopy 12/16/13 - hyperplastic polyp.  Recommended f/u colonoscopy in 12/2018.        Tobacco use    Discussed the need to quit today.  Follow.        Type 1 diabetes mellitus (HCC) - Primary    Not using insulin pump.  Sugars as outlined.  Gives herself correction boluses.  a1c just checked 7.6.  Has improved.  Discussed diet and exercise.  Discussed weight loss.  Follow met b and a1c.  Up to date with eye checks.        Relevant Medications   lisinopril (PRINIVIL,ZESTRIL) 20 MG tablet       Einar Pheasant, MD

## 2015-12-30 ENCOUNTER — Encounter: Payer: Self-pay | Admitting: Internal Medicine

## 2015-12-30 DIAGNOSIS — Z0000 Encounter for general adult medical examination without abnormal findings: Secondary | ICD-10-CM | POA: Insufficient documentation

## 2015-12-30 DIAGNOSIS — I10 Essential (primary) hypertension: Secondary | ICD-10-CM | POA: Insufficient documentation

## 2015-12-30 NOTE — Assessment & Plan Note (Signed)
Colonoscopy 12/16/13 - hyperplastic polyp.  Recommended f/u colonoscopy in 12/2018.   

## 2015-12-30 NOTE — Assessment & Plan Note (Signed)
Not using insulin pump.  Sugars as outlined.  Gives herself correction boluses.  a1c just checked 7.6.  Has improved.  Discussed diet and exercise.  Discussed weight loss.  Follow met b and a1c.  Up to date with eye checks.

## 2015-12-30 NOTE — Assessment & Plan Note (Addendum)
Low cholesterol diet and exercise.  Discussed treatment.  Had increased LFTs with previous statin medications.  Discussed welchol.  She wants to check her insurance.  Follow lipid panel.

## 2015-12-30 NOTE — Assessment & Plan Note (Signed)
Physical today 12/28/15.  Mammogram 06/28/15 - Birads I.  Colonoscopy 2014.  PAP 2015 - Dr DeFrancesco - negative with negative HPV.  Dr Greggory Keen - performs her breast, pelvic and pap smears.

## 2015-12-30 NOTE — Assessment & Plan Note (Signed)
Blood pressure as outlined.  On lisinopril.  Increase to  q day.  Follow pressures.  Get her back in soon to reassess.   Follow metabolic panel.

## 2015-12-30 NOTE — Assessment & Plan Note (Signed)
Discussed the need to quit today.  Follow.

## 2015-12-30 NOTE — Assessment & Plan Note (Signed)
Overall stable.  Refilled xanax.  Rarely uses.

## 2016-02-08 ENCOUNTER — Other Ambulatory Visit: Payer: Self-pay | Admitting: Internal Medicine

## 2016-03-13 ENCOUNTER — Ambulatory Visit: Payer: Managed Care, Other (non HMO) | Admitting: Internal Medicine

## 2016-07-31 LAB — HM DIABETES EYE EXAM

## 2016-08-11 ENCOUNTER — Other Ambulatory Visit: Payer: Self-pay | Admitting: Internal Medicine

## 2016-08-15 ENCOUNTER — Telehealth: Payer: Self-pay | Admitting: Internal Medicine

## 2016-08-15 DIAGNOSIS — E109 Type 1 diabetes mellitus without complications: Secondary | ICD-10-CM

## 2016-08-15 DIAGNOSIS — I10 Essential (primary) hypertension: Secondary | ICD-10-CM

## 2016-08-15 DIAGNOSIS — E7800 Pure hypercholesterolemia, unspecified: Secondary | ICD-10-CM

## 2016-08-15 NOTE — Telephone Encounter (Signed)
Pt would like to have labs done before appt.. There are no labs in the system.. Labs need to go to [facility].. Please advise

## 2016-08-15 NOTE — Telephone Encounter (Signed)
Please advise 

## 2016-08-17 NOTE — Telephone Encounter (Signed)
[facility]  Please notify pt that the orders have been placed and she can go by [facility] for fasting labs.

## 2016-08-18 NOTE — Telephone Encounter (Signed)
Left message to notify

## 2016-09-13 LAB — BASIC METABOLIC PANEL
BUN / CREAT RATIO: 20 (ref 9–23)
BUN: 18 mg/dL (ref 6–24)
CO2: 27 mmol/L (ref 18–29)
CREATININE: 0.89 mg/dL (ref 0.57–1.00)
Calcium: 10.3 mg/dL — ABNORMAL HIGH (ref 8.7–10.2)
Chloride: 101 mmol/L (ref 96–106)
GFR calc Af Amer: 83 mL/min/{1.73_m2} (ref >59)
GFR calc non Af Amer: 72 mL/min/{1.73_m2} (ref >59)
GLUCOSE: 119 mg/dL — AB (ref 65–99)
POTASSIUM: 4.3 mmol/L (ref 3.5–5.2)
SODIUM: 141 mmol/L (ref 134–144)

## 2016-09-13 LAB — HEMOGLOBIN A1C
Est. average glucose Bld gHb Est-mCnc: 183 mg/dL
HEMOGLOBIN A1C: 8 % — AB (ref 4.8–5.6)

## 2016-09-13 LAB — HEPATIC FUNCTION PANEL
ALT: 17 IU/L (ref 0–32)
AST: 15 IU/L (ref 0–40)
Albumin: 4.2 g/dL (ref 3.5–5.5)
Alkaline Phosphatase: 60 IU/L (ref 39–117)
BILIRUBIN TOTAL: 0.3 mg/dL (ref 0.0–1.2)
Bilirubin, Direct: 0.08 mg/dL (ref 0.00–0.40)
Total Protein: 6.6 g/dL (ref 6.0–8.5)

## 2016-09-13 LAB — LIPID PANEL
CHOLESTEROL TOTAL: 239 mg/dL — AB (ref 100–199)
Chol/HDL Ratio: 5.2 ratio units — ABNORMAL HIGH (ref 0.0–4.4)
HDL: 46 mg/dL (ref >39)
LDL CALC: 153 mg/dL — AB (ref 0–99)
TRIGLYCERIDES: 199 mg/dL — AB (ref 0–149)
VLDL CHOLESTEROL CAL: 40 mg/dL (ref 5–40)

## 2016-09-17 ENCOUNTER — Telehealth: Payer: Self-pay | Admitting: *Deleted

## 2016-09-17 NOTE — Telephone Encounter (Signed)
Patient requested lab results  Pt contact [phone]

## 2016-09-17 NOTE — Telephone Encounter (Signed)
Patient notified

## 2016-09-19 ENCOUNTER — Ambulatory Visit (INDEPENDENT_AMBULATORY_CARE_PROVIDER_SITE_OTHER): Payer: Managed Care, Other (non HMO) | Admitting: Internal Medicine

## 2016-09-19 ENCOUNTER — Encounter: Payer: Self-pay | Admitting: Internal Medicine

## 2016-09-19 VITALS — BP 132/78 | HR 82 | Temp 97.8°F | Ht 64.0 in | Wt 223.2 lb

## 2016-09-19 DIAGNOSIS — Z1239 Encounter for other screening for malignant neoplasm of breast: Secondary | ICD-10-CM

## 2016-09-19 DIAGNOSIS — E109 Type 1 diabetes mellitus without complications: Secondary | ICD-10-CM

## 2016-09-19 DIAGNOSIS — I10 Essential (primary) hypertension: Secondary | ICD-10-CM

## 2016-09-19 DIAGNOSIS — Z86010 Personal history of colonic polyps: Secondary | ICD-10-CM | POA: Diagnosis not present

## 2016-09-19 DIAGNOSIS — Z1231 Encounter for screening mammogram for malignant neoplasm of breast: Secondary | ICD-10-CM

## 2016-09-19 DIAGNOSIS — Z23 Encounter for immunization: Secondary | ICD-10-CM | POA: Diagnosis not present

## 2016-09-19 DIAGNOSIS — Z0289 Encounter for other administrative examinations: Secondary | ICD-10-CM

## 2016-09-19 MED ORDER — LISINOPRIL 20 MG PO TABS: 20.0000 mg | ORAL_TABLET | Freq: Every day | ORAL | 1 refills | Status: DC

## 2016-09-19 NOTE — Progress Notes (Signed)
Patient ID: Jill Crosby, female   DOB: 09-16-1961, 55 y.o.   MRN: [id number]   Subjective:    Patient ID: Jill Crosby, female    DOB: [birth year], 55 y.o.   MRN: [id number]  HPI  Patient here for a scheduled follow up.  Have not seen her since 12/2015.   Her a1c was just checked and was 8.0.  She is on insulin.  Adjusts her dosing herself.  Doses based on carb intake.  Discussed nutrition referral.  She declines.  She has been eating her evening meal around 7-8.  Goes to bed around 12:00.  Wakes up at 4:30.  Drinks coffee.  Eats yogurt at 9:00.  Eats lunch around 1:00.  May eat a pack of nabs.  Discussed importance of eating regular meals and not going long periods without eating.  She is taking Lantus 50 units q hs.  Adjust the humalog based on carb intake.  Discussed diet and exercise.  She needs a form completed for her work about not meeting her BMI goal.  She quit smoking in 01/2016.  No chest pain.  No sob.  No acid reflux.  No abdominal pain or cramping.  Bowels stable.     Past Medical History:  Diagnosis Date  . Anxiety   . Depression   . Elevated BP   . Hypercholesterolemia   . Increased BMI   . Menopause   . Type I diabetes mellitus (HCC)    Past Surgical History:  Procedure Laterality Date  . CESAREAN SECTION  1997   Family History  Problem Relation Age of Onset  . Hypertension Mother   . Hyperlipidemia Mother   . Colon polyps Mother   . Cancer Father     lung  . Colon cancer Maternal Grandfather   . Diabetes Maternal Grandfather   . Breast cancer Maternal Grandmother   . Colon cancer Paternal Grandfather   . Heart disease Neg Hx   . Ovarian cancer Neg Hx    Social History   Social History  . Marital status: Single    Spouse name: N/A  . Number of children: N/A  . Years of education: N/A   Social History Main Topics  . Smoking status: Former Smoker    Packs/day: 0.50    Years: 20.00    Types: Cigarettes  . Smokeless tobacco: Never Used  . Alcohol  use No  . Drug use: No  . Sexual activity: Not Currently    Birth control/ protection: Post-menopausal   Other Topics Concern  . None   Social History Narrative  . None    Outpatient Encounter Prescriptions as of 09/19/2016  Medication Sig  . glucose blood (BAYER CONTOUR NEXT TEST) test strip Use as instructed  . HUMALOG KWIKPEN 100 UNIT/ML KiwkPen Inject subcutaneously 50  units per day  . Lancets MISC Use as directed  . LANTUS SOLOSTAR 100 UNIT/ML Solostar Pen Inject subcutaneously 55  units nightly  . lisinopril (PRINIVIL,ZESTRIL) 20 MG tablet Take 1 tablet (20 mg total) by mouth daily.  Marland Kitchen loratadine (CLARITIN) 10 MG tablet Take 10 mg by mouth daily.  . [DISCONTINUED] ALPRAZolam (XANAX) 0.25 MG tablet Take 1 tablet (0.25 mg total) by mouth daily as needed for anxiety.  . [DISCONTINUED] cetirizine (ZYRTEC) 10 MG tablet Take by mouth.  . [DISCONTINUED] lisinopril (PRINIVIL,ZESTRIL) 20 MG tablet Take 1 tablet (20 mg total) by mouth daily.  . [DISCONTINUED] lisinopril (PRINIVIL,ZESTRIL) 20 MG tablet Take 1 tablet (20 mg total) by mouth  daily.   No facility-administered encounter medications on file as of 09/19/2016.     Review of Systems  Constitutional: Negative for appetite change and unexpected weight change.  HENT: Negative for congestion and sinus pressure.   Respiratory: Negative for cough, chest tightness and shortness of breath.   Cardiovascular: Negative for chest pain, palpitations and leg swelling.  Gastrointestinal: Negative for abdominal pain, diarrhea, nausea and vomiting.  Genitourinary: Negative for difficulty urinating and dysuria.  Musculoskeletal: Negative for back pain and joint swelling.  Skin: Negative for color change and rash.  Neurological: Negative for dizziness, light-headedness and headaches.  Psychiatric/Behavioral: Negative for agitation and dysphoric mood.       Objective:     Blood pressure rechecked by me:  152/78  Physical Exam    Constitutional: She appears well-developed and well-nourished. No distress.  HENT:  Nose: Nose normal.  Mouth/Throat: Oropharynx is clear and moist.  Neck: Neck supple. No thyromegaly present.  Cardiovascular: Normal rate and regular rhythm.   Pulmonary/Chest: Breath sounds normal. No respiratory distress. She has no wheezes.  Abdominal: Soft. Bowel sounds are normal. There is no tenderness.  Musculoskeletal: She exhibits no edema or tenderness.  Lymphadenopathy:    She has no cervical adenopathy.  Skin: No rash noted. No erythema.  Psychiatric: She has a normal mood and affect. Her behavior is normal.    BP 132/78   Pulse 82   Temp 97.8 F (36.6 C) (Oral)   Ht 5\' 4"  (1.626 m)   Wt 223 lb 3.2 oz (101.2 kg)   SpO2 95%   BMI 38.31 kg/m  Wt Readings from Last 3 Encounters:  09/19/16 223 lb 3.2 oz (101.2 kg)  12/28/15 221 lb 4 oz (100.4 kg)  10/04/15 219 lb 6.4 oz (99.5 kg)     Lab Results  Component Value Date   WBC 9.8 12/21/2015   HGB 13.9 03/21/2013   HCT 44.2 12/21/2015   PLT 204 12/21/2015   GLUCOSE 119 (H) 09/12/2016   CHOL 239 (H) 09/12/2016   TRIG 199 (H) 09/12/2016   HDL 46 09/12/2016   LDLCALC 153 (H) 09/12/2016   ALT 17 09/12/2016   AST 15 09/12/2016   NA 141 09/12/2016   K 4.3 09/12/2016   CL 101 09/12/2016   CREATININE 0.89 09/12/2016   BUN 18 09/12/2016   CO2 27 09/12/2016   TSH 2.700 12/21/2015   HGBA1C 8.0 (H) 09/12/2016    Mm Digital Screening Bilateral  Result Date: 06/28/2015 CLINICAL DATA:  Screening. EXAM: DIGITAL SCREENING BILATERAL MAMMOGRAM WITH CAD COMPARISON:  Previous exam(s). ACR Breast Density Category b: There are scattered areas of fibroglandular density. FINDINGS: There are no findings suspicious for malignancy. Images were processed with CAD. IMPRESSION: No mammographic evidence of malignancy. A result letter of this screening mammogram will be mailed directly to the patient. RECOMMENDATION: Screening mammogram in one year.  (Code:SM-B-01Y) BI-RADS CATEGORY  1: Negative. Electronically Signed   By: Norva Pavlov M.D.   On: 06/28/2015 11:53       Assessment & Plan:   Problem List Items Addressed This Visit    Essential hypertension    Blood pressure elevated.  Restart lisinopril.  Follow.        Relevant Medications   lisinopril (PRINIVIL,ZESTRIL) 20 MG tablet   History of colonic polyps    Colonoscopy 12/16/13 as outlined.  Recommended f/u colonoscopy in 12/2018.        Type 1 diabetes mellitus (HCC) - Primary    On insulin.  Recent a1c 8.0.  Discussed her diet and eating regular meals and not skipping meals.  Discussed diet and exercise.  States she is up to date with eye exams.  Check and record sugars bid.  Send in readings over the next 2-3 weeks.  Get her in soon to follow up.        Relevant Medications   lisinopril (PRINIVIL,ZESTRIL) 20 MG tablet    Other Visit Diagnoses    Encounter for immunization       Relevant Medications   loratadine (CLARITIN) 10 MG tablet   lisinopril (PRINIVIL,ZESTRIL) 20 MG tablet   Other Relevant Orders   Flu Vaccine QUAD 36+ mos IM (Completed)   MM DIGITAL SCREENING BILATERAL   Hypercalcemia       Slightly elevated on recent check.  recheck calcium with next labs.  stay hydrated.     Relevant Orders   Calcium   Encounter for completion of form with patient       discussed diet and exercise today.  form completed.     Breast cancer screening       Relevant Orders   MM DIGITAL SCREENING BILATERAL     I spent 40 minutes with the patient and more than 50% of the time was spent in consultation regarding the above.  Time was spent going over her sugars, her diet, and discussed diet and exercise.  Also discussed her recent labs and treatment.  Discussed restarting her blood pressure medication and need for close f/u.    Dale DurhamSCOTT, Lorea Kupfer, MD

## 2016-09-19 NOTE — Progress Notes (Signed)
Pre visit review using our clinic review tool, if applicable. No additional management support is needed unless otherwise documented below in the visit note. 

## 2016-09-20 ENCOUNTER — Encounter: Payer: Self-pay | Admitting: Internal Medicine

## 2016-09-20 MED ORDER — LISINOPRIL 20 MG PO TABS: 20.0000 mg | ORAL_TABLET | Freq: Every day | ORAL | 1 refills | Status: DC

## 2016-09-20 NOTE — Assessment & Plan Note (Signed)
Blood pressure elevated.  Restart lisinopril.  Follow.

## 2016-09-20 NOTE — Assessment & Plan Note (Signed)
On insulin.  Recent a1c 8.0.  Discussed her diet and eating regular meals and not skipping meals.  Discussed diet and exercise.  States she is up to date with eye exams.  Check and record sugars bid.  Send in readings over the next 2-3 weeks.  Get her in soon to follow up.

## 2016-09-20 NOTE — Assessment & Plan Note (Signed)
Colonoscopy 12/16/13 as outlined.  Recommended f/u colonoscopy in 12/2018.

## 2016-10-04 LAB — CALCIUM: CALCIUM: 9.5 mg/dL (ref 8.7–10.2)

## 2016-10-09 ENCOUNTER — Ambulatory Visit (INDEPENDENT_AMBULATORY_CARE_PROVIDER_SITE_OTHER): Payer: Managed Care, Other (non HMO) | Admitting: Obstetrics and Gynecology

## 2016-10-09 ENCOUNTER — Encounter: Payer: Self-pay | Admitting: Obstetrics and Gynecology

## 2016-10-09 VITALS — BP 132/70 | HR 88 | Ht 64.25 in | Wt 220.0 lb

## 2016-10-09 DIAGNOSIS — E6609 Other obesity due to excess calories: Secondary | ICD-10-CM | POA: Diagnosis not present

## 2016-10-09 DIAGNOSIS — Z124 Encounter for screening for malignant neoplasm of cervix: Secondary | ICD-10-CM | POA: Diagnosis not present

## 2016-10-09 DIAGNOSIS — IMO0001 Reserved for inherently not codable concepts without codable children: Secondary | ICD-10-CM

## 2016-10-09 DIAGNOSIS — Z6835 Body mass index (BMI) 35.0-35.9, adult: Secondary | ICD-10-CM

## 2016-10-09 DIAGNOSIS — Z01419 Encounter for gynecological examination (general) (routine) without abnormal findings: Secondary | ICD-10-CM | POA: Diagnosis not present

## 2016-10-09 DIAGNOSIS — N952 Postmenopausal atrophic vaginitis: Secondary | ICD-10-CM | POA: Diagnosis not present

## 2016-10-09 NOTE — Patient Instructions (Signed)
Health Maintenance, Female Adopting a healthy lifestyle and getting preventive care can go a long way to promote health and wellness. Talk with your health care provider about what schedule of regular examinations is right for you. This is a good chance for you to check in with your provider about disease prevention and staying healthy. In between checkups, there are plenty of things you can do on your own. Experts have done a lot of research about which lifestyle changes and preventive measures are most likely to keep you healthy. Ask your health care provider for more information. WEIGHT AND DIET  Eat a healthy diet  Be sure to include plenty of vegetables, fruits, low-fat dairy products, and lean protein.  Do not eat a lot of foods high in solid fats, added sugars, or salt.  Get regular exercise. This is one of the most important things you can do for your health.  Most adults should exercise for at least 150 minutes each week. The exercise should increase your heart rate and make you sweat (moderate-intensity exercise).  Most adults should also do strengthening exercises at least twice a week. This is in addition to the moderate-intensity exercise.  Maintain a healthy weight  Body mass index (BMI) is a measurement that can be used to identify possible weight problems. It estimates body fat based on height and weight. Your health care provider can help determine your BMI and help you achieve or maintain a healthy weight.  For females 20 years of age and older:   A BMI below 18.5 is considered underweight.  A BMI of 18.5 to 24.9 is normal.  A BMI of 25 to 29.9 is considered overweight.  A BMI of 30 and above is considered obese.  Watch levels of cholesterol and blood lipids  You should start having your blood tested for lipids and cholesterol at 57 years of age, then have this test every 5 years.  You may need to have your cholesterol levels checked more often if:  Your lipid  or cholesterol levels are high.  You are older than 57 years of age.  You are at high risk for heart disease.  CANCER SCREENING   Lung Cancer  Lung cancer screening is recommended for adults 55-80 years old who are at high risk for lung cancer because of a history of smoking.  A yearly low-dose CT scan of the lungs is recommended for people who:  Currently smoke.  Have quit within the past 15 years.  Have at least a 30-pack-year history of smoking. A pack year is smoking an average of one pack of cigarettes a day for 1 year.  Yearly screening should continue until it has been 15 years since you quit.  Yearly screening should stop if you develop a health problem that would prevent you from having lung cancer treatment.  Breast Cancer  Practice breast self-awareness. This means understanding how your breasts normally appear and feel.  It also means doing regular breast self-exams. Let your health care provider know about any changes, no matter how small.  If you are in your 20s or 30s, you should have a clinical breast exam (CBE) by a health care provider every 1-3 years as part of a regular health exam.  If you are 40 or older, have a CBE every year. Also consider having a breast X-ray (mammogram) every year.  If you have a family history of breast cancer, talk to your health care provider about genetic screening.  If you   are at high risk for breast cancer, talk to your health care provider about having an MRI and a mammogram every year.  Breast cancer gene (BRCA) assessment is recommended for women who have family members with BRCA-related cancers. BRCA-related cancers include:  Breast.  Ovarian.  Tubal.  Peritoneal cancers.  Results of the assessment will determine the need for genetic counseling and BRCA1 and BRCA2 testing. Cervical Cancer Your health care provider may recommend that you be screened regularly for cancer of the pelvic organs (ovaries, uterus, and  vagina). This screening involves a pelvic examination, including checking for microscopic changes to the surface of your cervix (Pap test). You may be encouraged to have this screening done every 3 years, beginning at age 21.  For women ages 30-65, health care providers may recommend pelvic exams and Pap testing every 3 years, or they may recommend the Pap and pelvic exam, combined with testing for human papilloma virus (HPV), every 5 years. Some types of HPV increase your risk of cervical cancer. Testing for HPV may also be done on women of any age with unclear Pap test results.  Other health care providers may not recommend any screening for nonpregnant women who are considered low risk for pelvic cancer and who do not have symptoms. Ask your health care provider if a screening pelvic exam is right for you.  If you have had past treatment for cervical cancer or a condition that could lead to cancer, you need Pap tests and screening for cancer for at least 20 years after your treatment. If Pap tests have been discontinued, your risk factors (such as having a new sexual partner) need to be reassessed to determine if screening should resume. Some women have medical problems that increase the chance of getting cervical cancer. In these cases, your health care provider may recommend more frequent screening and Pap tests. Colorectal Cancer  This type of cancer can be detected and often prevented.  Routine colorectal cancer screening usually begins at 57 years of age and continues through 57 years of age.  Your health care provider may recommend screening at an earlier age if you have risk factors for colon cancer.  Your health care provider may also recommend using home test kits to check for hidden blood in the stool.  A small camera at the end of a tube can be used to examine your colon directly (sigmoidoscopy or colonoscopy). This is done to check for the earliest forms of colorectal  cancer.  Routine screening usually begins at age 50.  Direct examination of the colon should be repeated every 5-10 years through 57 years of age. However, you may need to be screened more often if early forms of precancerous polyps or small growths are found. Skin Cancer  Check your skin from head to toe regularly.  Tell your health care provider about any new moles or changes in moles, especially if there is a change in a mole's shape or color.  Also tell your health care provider if you have a mole that is larger than the size of a pencil eraser.  Always use sunscreen. Apply sunscreen liberally and repeatedly throughout the day.  Protect yourself by wearing long sleeves, pants, a wide-brimmed hat, and sunglasses whenever you are outside. HEART DISEASE, DIABETES, AND HIGH BLOOD PRESSURE   High blood pressure causes heart disease and increases the risk of stroke. High blood pressure is more likely to develop in:  People who have blood pressure in the high end   of the normal range (130-139/85-89 mm Hg).  People who are overweight or obese.  People who are African American.  If you are 38-23 years of age, have your blood pressure checked every 3-5 years. If you are 61 years of age or older, have your blood pressure checked every year. You should have your blood pressure measured twice--once when you are at a hospital or clinic, and once when you are not at a hospital or clinic. Record the average of the two measurements. To check your blood pressure when you are not at a hospital or clinic, you can use:  An automated blood pressure machine at a pharmacy.  A home blood pressure monitor.  If you are between 45 years and 39 years old, ask your health care provider if you should take aspirin to prevent strokes.  Have regular diabetes screenings. This involves taking a blood sample to check your fasting blood sugar level.  If you are at a normal weight and have a low risk for diabetes,  have this test once every three years after 57 years of age.  If you are overweight and have a high risk for diabetes, consider being tested at a younger age or more often. PREVENTING INFECTION  Hepatitis B  If you have a higher risk for hepatitis B, you should be screened for this virus. You are considered at high risk for hepatitis B if:  You were born in a country where hepatitis B is common. Ask your health care provider which countries are considered high risk.  Your parents were born in a high-risk country, and you have not been immunized against hepatitis B (hepatitis B vaccine).  You have HIV or AIDS.  You use needles to inject street drugs.  You live with someone who has hepatitis B.  You have had sex with someone who has hepatitis B.  You get hemodialysis treatment.  You take certain medicines for conditions, including cancer, organ transplantation, and autoimmune conditions. Hepatitis C  Blood testing is recommended for:  Everyone born from [birth year] through 1967.  Anyone with known risk factors for hepatitis C. Sexually transmitted infections (STIs)  You should be screened for sexually transmitted infections (STIs) including gonorrhea and chlamydia if:  You are sexually active and are younger than 57 years of age.  You are older than 57 years of age and your health care provider tells you that you are at risk for this type of infection.  Your sexual activity has changed since you were last screened and you are at an increased risk for chlamydia or gonorrhea. Ask your health care provider if you are at risk.  If you do not have HIV, but are at risk, it may be recommended that you take a prescription medicine daily to prevent HIV infection. This is called pre-exposure prophylaxis (PrEP). You are considered at risk if:  You are sexually active and do not regularly use condoms or know the HIV status of your partner(s).  You take drugs by injection.  You are sexually  active with a partner who has HIV. Talk with your health care provider about whether you are at high risk of being infected with HIV. If you choose to begin PrEP, you should first be tested for HIV. You should then be tested every 3 months for as long as you are taking PrEP.  PREGNANCY   If you are premenopausal and you may become pregnant, ask your health care provider about preconception counseling.  If you may  become pregnant, take 400 to 800 micrograms (mcg) of folic acid every day.  If you want to prevent pregnancy, talk to your health care provider about birth control (contraception). OSTEOPOROSIS AND MENOPAUSE   Osteoporosis is a disease in which the bones lose minerals and strength with aging. This can result in serious bone fractures. Your risk for osteoporosis can be identified using a bone density scan.  If you are 40 years of age or older, or if you are at risk for osteoporosis and fractures, ask your health care provider if you should be screened.  Ask your health care provider whether you should take a calcium or vitamin D supplement to lower your risk for osteoporosis.  Menopause may have certain physical symptoms and risks.  Hormone replacement therapy may reduce some of these symptoms and risks. Talk to your health care provider about whether hormone replacement therapy is right for you.  HOME CARE INSTRUCTIONS   Schedule regular health, dental, and eye exams.  Stay current with your immunizations.   Do not use any tobacco products including cigarettes, chewing tobacco, or electronic cigarettes.  If you are pregnant, do not drink alcohol.  If you are breastfeeding, limit how much and how often you drink alcohol.  Limit alcohol intake to no more than 1 drink per day for nonpregnant women. One drink equals 12 ounces of beer, 5 ounces of wine, or 1 ounces of hard liquor.  Do not use street drugs.  Do not share needles.  Ask your health care provider for help if  you need support or information about quitting drugs.  Tell your health care provider if you often feel depressed.  Tell your health care provider if you have ever been abused or do not feel safe at home.   This information is not intended to replace advice given to you by your health care provider. Make sure you discuss any questions you have with your health care provider.   Document Released: 06/02/2011 Document Revised: 12/08/2014 Document Reviewed: 10/19/2013 Elsevier Interactive Patient Education [employer].

## 2016-10-09 NOTE — Progress Notes (Deleted)
ANNUAL PREVENTATIVE CARE GYNECOLOGY  ENCOUNTER NOTE  Subjective:       Jill Crosby is a 55 y.o. [phone] female here for a routine annual gynecologic exam. The patient {is/is not/has never been:13135} sexually active. The patient {is/is not:13135} taking hormone replacement therapy. {post-men bleed:13152::"Patient denies post-menopausal vaginal bleeding."} The patient wears seatbelts: {yes/no:311178}. The patient participates in regular exercise: {yes/no/not asked:9010}. Has the patient ever been transfused or tattooed?: {yes/no/not asked:9010}. The patient reports that there {is/is not:9024} domestic violence in her life. Current complaints: 1.  None   Gynecologic History No LMP recorded. Patient is postmenopausal. Contraception: {method:5051} Last Pap: 2015. Results were: normal.  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 Results were: normal.   Next mammogram due Nove 30th.  Last Colonoscopy: 2 years ago Last Dexa Scan:    Obstetric History OB History  Gravida Para Term Preterm AB Living  5 3 2 1 2 2   SAB TAB Ectopic Multiple Live Births  2       2    # Outcome Date GA Lbr Len/2nd Weight Sex Delivery Anes PTL Lv  5 Term 1997   9 lb 1.9 oz (4.137 kg) M CS-LVertical   LIV  4 SAB 1993          3 Term 1989   7 lb 14.4 oz (3.583 kg) M Vag-Spont   LIV  2 SAB 1987          1 Preterm 1978        FD      Past Medical History:  Diagnosis Date  . Anxiety   . Depression   . Elevated BP   . Hypercholesterolemia   . Increased BMI   . Menopause   . Type I diabetes mellitus (HCC)     Family History  Problem Relation Age of Onset  . Hypertension Mother   . Hyperlipidemia Mother   . Colon polyps Mother   . Cancer Father     lung  . Colon cancer Maternal Grandfather   . Diabetes Maternal Grandfather   . Breast cancer Maternal Grandmother   . Colon cancer Paternal Grandfather   . Heart disease Neg Hx   . Ovarian cancer Neg Hx     Past Surgical  History:  Procedure Laterality Date  . CESAREAN SECTION  1997    Social History   Social History  . Marital status: Single    Spouse name: N/A  . Number of children: N/A  . Years of education: N/A   Occupational History  . Not on file.   Social History Main Topics  . Smoking status: Former Smoker    Packs/day: 0.50    Years: 20.00    Types: Cigarettes  . Smokeless tobacco: Never Used  . Alcohol use No  . Drug use: No  . Sexual activity: Not Currently    Birth control/ protection: Post-menopausal   Other Topics Concern  . Not on file   Social History Narrative  . No narrative on file    Current Outpatient Prescriptions on File Prior to Visit  Medication Sig Dispense Refill  . glucose blood (BAYER CONTOUR NEXT TEST) test strip Use as instructed 100 each 12  . HUMALOG KWIKPEN 100 UNIT/ML KiwkPen Inject subcutaneously 50  units per day 45 mL 3  . Lancets MISC Use as directed 100 each 3  . LANTUS SOLOSTAR 100 UNIT/ML Solostar Pen Inject subcutaneously 55  units nightly 60 mL 0  .  lisinopril (PRINIVIL,ZESTRIL) 20 MG tablet Take 1 tablet (20 mg total) by mouth daily. 90 tablet 1  . loratadine (CLARITIN) 10 MG tablet Take 10 mg by mouth daily.     No current facility-administered medications on file prior to visit.     Allergies  Allergen Reactions  . Codeine       Review of Systems ROS Review of Systems - General ROS: negative for - chills, fatigue, fever, hot flashes, night sweats, weight gain or weight loss Psychological ROS: negative for - anxiety, decreased libido, depression, mood swings, physical abuse or sexual abuse Ophthalmic ROS: negative for - blurry vision, eye pain or loss of vision ENT ROS: negative for - headaches, hearing change, visual changes or vocal changes Allergy and Immunology ROS: negative for - hives, itchy/watery eyes or seasonal allergies Hematological and Lymphatic ROS: negative for - bleeding problems, bruising, swollen lymph nodes or  weight loss Endocrine ROS: negative for - galactorrhea, hair pattern changes, hot flashes, malaise/lethargy, mood swings, palpitations, polydipsia/polyuria, skin changes, temperature intolerance or unexpected weight changes Breast ROS: negative for - new or changing breast lumps or nipple discharge Respiratory ROS: negative for - cough or shortness of breath Cardiovascular ROS: negative for - chest pain, irregular heartbeat, palpitations or shortness of breath Gastrointestinal ROS: no abdominal pain, change in bowel habits, or black or bloody stools Genito-Urinary ROS: no dysuria, trouble voiding, or hematuria Musculoskeletal ROS: negative for - joint pain or joint stiffness Neurological ROS: negative for - bowel and bladder control changes Dermatological ROS: negative for rash and skin lesion changes   Objective:   BP 132/70 (BP Location: Left Arm, Patient Position: Sitting, Cuff Size: Large)   Pulse 88   Ht 5' 4.25" (1.632 m)   Wt 220 lb (99.8 kg)   BMI 37.47 kg/m  CONSTITUTIONAL: Well-developed, well-nourished female in no acute distress.  PSYCHIATRIC: Normal mood and affect. Normal behavior. Normal judgment and thought content. NEUROLGIC: Alert and oriented to person, place, and time. Normal muscle tone coordination. No cranial nerve deficit noted. HENT:  Normocephalic, atraumatic, External right and left ear normal. Oropharynx is clear and moist EYES: Conjunctivae and EOM are normal. Pupils are equal, round, and reactive to light. No scleral icterus.  NECK: Normal range of motion, supple, no masses.  Normal thyroid.  SKIN: Skin is warm and dry. No rash noted. Not diaphoretic. No erythema. No pallor. CARDIOVASCULAR: Normal heart rate noted, regular rhythm, no murmur. RESPIRATORY: Clear to auscultation bilaterally. Effort and breath sounds normal, no problems with respiration noted. BREASTS: Symmetric in size. No masses, skin changes, nipple drainage, or lymphadenopathy. ABDOMEN:  Soft, normal bowel sounds, no distention noted.  No tenderness, rebound or guarding.  BLADDER: Normal PELVIC:  Bladder {bladder exam:311640}  Urethra: {urethra:311719::"not indicated"}  Vulva: {vulva:311722::"not indicated"}  Vagina: {vagina exam:311643::"not indicated"}  Cervix: {cervix:311644::"not indicated"}  Uterus: {uterus:311718::"not indicated"}  Adnexa: {adnexa:311645::"not indicated"}  RV: {Blank multiple:19196::"External Exam NormaI","No Rectal Masses","Normal Sphincter tone"}  MUSCULOSKELETAL: Normal range of motion. No tenderness.  No cyanosis, clubbing, or edema.  2+ distal pulses. LYMPHATIC: No Axillary, Supraclavicular, or Inguinal Adenopathy.    Assessment:   Annual gynecologic examination 55 y.o. Contraception: {method:5051} {Blank multiple:19196::"Normal BMI","Overweight","Obesity 1","Obesity 2"} Problem List Items Addressed This Visit    None      Plan:  Pap: {Blank multiple:19196::"Pap, Reflex if ASCUS","Pap Co Test","GC/CT NAAT","Not needed","Not done"} Mammogram: {Blank multiple:19196::"Ordered","Not Ordered","Not Indicated","***"} Stool Guaiac Testing:  {Blank multiple:19196::"Ordered","Not Ordered","Not Indicated","***"} Labs: {Blank multiple:19196::"Lipid 1","FBS","TSH","Hemoglobin A1C","Vit D Level""***"} Routine preventative health maintenance measures emphasized: {  Blank multiple:19196::"Exercise/Diet/Weight control","Tobacco Warnings","Alcohol/Substance use risks","Stress Management","Peer Pressure Issues","Safe Sex"} *** Return to Clinic - 1 Year   Hildred Laser, MD

## 2016-10-13 NOTE — Progress Notes (Signed)
ANNUAL PREVENTATIVE CARE GYN  ENCOUNTER NOTE  Subjective:       Jill Crosby is a 55 y.o. [phone] female here for a routine annual gynecologic exam. The patient is not currently sexually active. The patient has never been taking hormone replacement therapy. Patient denies post-menopausal vaginal bleeding. The patient wears seatbelts: yes. The patient participates in regular exercise: yes. Has the patient ever been transfused or tattooed?: no. The patient reports that there is not domestic violence in her life. Labs are managed by PCP.   Gynecologic History No LMP recorded. Patient is postmenopausal. Contraception: post menopausal status. No HRT, no vasomotor symptoms. Last Pap: 09/29/2014 neg/neg. Results were: normal. Had normal pap but +HPV pap in 2014 colonoscopy 2014.  Last mammogram: 07/2015 . Results were: normal Last colonoscopy: 2015. Notes normal study.   Obstetric History OB History  Gravida Para Term Preterm AB Living  5 3 2 1 2 2   SAB TAB Ectopic Multiple Live Births  2       2    # Outcome Date GA Lbr Len/2nd Weight Sex Delivery Anes PTL Lv  5 Term 1997   9 lb 1.9 oz (4.137 kg) M CS-LVertical   LIV  4 SAB 1993          3 Term 1989   7 lb 14.4 oz (3.583 kg) M Vag-Spont   LIV  2 SAB 1987          1 Preterm 1978        FD      Past Medical History:  Diagnosis Date  . Anxiety   . Depression   . Elevated BP   . Hypercholesterolemia   . Increased BMI   . Menopause   . Type I diabetes mellitus (HCC)     Past Surgical History:  Procedure Laterality Date  . CESAREAN SECTION  1997    Current Outpatient Prescriptions on File Prior to Visit  Medication Sig Dispense Refill  . glucose blood (BAYER CONTOUR NEXT TEST) test strip Use as instructed 100 each 12  . HUMALOG KWIKPEN 100 UNIT/ML KiwkPen Inject subcutaneously 50  units per day 45 mL 3  . Lancets MISC Use as directed 100 each 3  . LANTUS SOLOSTAR 100 UNIT/ML Solostar Pen Inject subcutaneously 55  units  nightly 60 mL 0  . lisinopril (PRINIVIL,ZESTRIL) 20 MG tablet Take 1 tablet (20 mg total) by mouth daily. 90 tablet 1  . loratadine (CLARITIN) 10 MG tablet Take 10 mg by mouth daily.     No current facility-administered medications on file prior to visit.     Allergies  Allergen Reactions  . Codeine     Social History   Social History  . Marital status: Single    Spouse name: N/A  . Number of children: N/A  . Years of education: N/A   Occupational History  . Not on file.   Social History Main Topics  . Smoking status: Former Smoker    Packs/day: 0.50    Years: 20.00    Types: Cigarettes  . Smokeless tobacco: Never Used  . Alcohol use No  . Drug use: No  . Sexual activity: Not Currently    Birth control/ protection: Post-menopausal   Other Topics Concern  . Not on file   Social History Narrative  . No narrative on file    Family History  Problem Relation Age of Onset  . Hypertension Mother   . Hyperlipidemia Mother   . Colon  polyps Mother   . Cancer Father     lung  . Colon cancer Maternal Grandfather   . Diabetes Maternal Grandfather   . Breast cancer Maternal Grandmother   . Colon cancer Paternal Grandfather   . Heart disease Neg Hx   . Ovarian cancer Neg Hx     The following portions of the patient's history were reviewed and updated as appropriate: allergies, current medications, past family history, past medical history, past social history, past surgical history and problem list.  Review of Systems ROS Review of Systems - General ROS: negative for - chills, fatigue, fever, hot flashes, night sweats. Positive for weight loss (intentional, 8lb) Psychological ROS: negative for - anxiety, decreased libido, depression, mood swings Ophthalmic ROS: negative for - blurry vision ENT ROS: negative for - headaches, visual changes  Endocrine ROS: negative for - galactorrhea, hair pattern changes, hot flashes, malaise/lethargy Breast ROS: negative for - new or  changing breast lumps or nipple discharge Respiratory ROS: negative for - cough or shortness of breath Cardiovascular ROS: negative for - chest pain, irregular heartbeat, palpitations or shortness of breath Gastrointestinal ROS: no abdominal pain, change in bowel habits, or black or bloody stools Genito-Urinary ROS: no dysuria, trouble voiding, or hematuria   Objective:   BP 132/70 (BP Location: Left Arm, Patient Position: Sitting, Cuff Size: Large)   Pulse 88   Ht 5' 4.25" (1.632 m)   Wt 220 lb (99.8 kg)   BMI 37.47 kg/m  CONSTITUTIONAL: Well-developed, well-nourished female in no acute distress.  PSYCHIATRIC: Normal mood and affect. Normal behavior.  NEUROLGIC: Alert and oriented to person, place, and time. Normal muscle tone coordination. No cranial nerve deficit noted. HENT:  Normocephalic, atraumatic EYES: Conjunctivae and EOM are normal. NECK: Normal range of motion, supple, no masses.  Normal thyroid.  SKIN: Skin is warm and dry. Not diaphoretic. Eczema patches noted on the abdomen. CARDIOVASCULAR: S1S2, 1/6 systolic murmur, occasional missed beats RESPIRATORY: Clear to auscultation bilaterally.  BREASTS: Symmetric in size. No masses, skin changes, nipple drainage, or lymphadenopathy. ABDOMEN: Soft, normal bowel sounds, no distention noted.  No tenderness, rebound or guarding.  BLADDER: Normal PELVIC:  External Genitalia: Skin colored fleshy lesion right buttock, nevus vs. acrochordon  BUS: Normal  Vagina: Mild vaginal atrophy  Cervix: Normal, no cervical motion tenderness  Uterus: Midline, not enlarged  Adnexa: No masses palpated  RV: External Exam NormaI, No Rectal Masses and Normal Sphincter tone  MUSCULOSKELETAL: Normal range of motion. No tenderness.  No cyanosis, clubbing, or edema.  2+ distal pulses. LYMPHATIC: No Axillary, Supraclavicular, or Inguinal Adenopathy.     Labs:  Lab Results  Component Value Date   WBC 9.8 12/21/2015   HGB 13.9 03/21/2013   HCT  44.2 12/21/2015   MCV 88 12/21/2015   PLT 204 12/21/2015      Chemistry      Component Value Date/Time   NA 141 09/12/2016 1151   K 4.3 09/12/2016 1151   CL 101 09/12/2016 1151   CO2 27 09/12/2016 1151   BUN 18 09/12/2016 1151   CREATININE 0.89 09/12/2016 1151      Component Value Date/Time   CALCIUM 9.5 10/03/2016 1132   ALKPHOS 60 09/12/2016 1151   AST 15 09/12/2016 1151   ALT 17 09/12/2016 1151   BILITOT 0.3 09/12/2016 1151      Lab Results  Component Value Date   CREATININE 0.89 09/12/2016   BUN 18 09/12/2016   NA 141 09/12/2016   K 4.3 09/12/2016  CL 101 09/12/2016   CO2 27 09/12/2016   Lab Results  Component Value Date   CHOL 239 (H) 09/12/2016   HDL 46 09/12/2016   LDLCALC 153 (H) 09/12/2016   TRIG 199 (H) 09/12/2016   CHOLHDL 5.2 (H) 09/12/2016    Lab Results  Component Value Date   TSH 2.700 12/21/2015    Assessment:   Annual gynecologic examination 55 y.o. Contraception: post menopausal status, without vasomotor symptoms. Never been on HRT. Obesity 2 Mild vaginal atrophy  Plan:  Pap: Pap Co Test, negative/negative last year, but h/o abnormal pap in 2014 (NILM but HPV+). Repeated pap today, if negative, continue routine screening.   Mammogram:normal 2016, ordered.  Stool Guaiac Testing:  Ordered, normal colonoscopy 2014 Labs: Through Dr. Lorin PicketScott Routine preventative health maintenance measures emphasized: Exercise/Diet/Weight control, Tobacco Warnings and Alcohol/Substance use risks  Patient has no vaginal or urinary symptoms from mild vaginal atrophy; no treatment indicated at this time  Return to Clinic - 1 Year   Hildred LaserAnika Francheska Villeda, MD  [facility]   '

## 2016-10-15 LAB — PAP IG AND HPV HIGH-RISK
HPV, HIGH-RISK: NEGATIVE
PAP SMEAR COMMENT: 0

## 2016-10-16 ENCOUNTER — Telehealth: Payer: Self-pay

## 2016-10-16 NOTE — Telephone Encounter (Signed)
-----   Message from Hildred LaserAnika Cherry, MD sent at 10/15/2016  5:07 PM EST ----- Please inform patient of normal pap smear.

## 2016-10-16 NOTE — Telephone Encounter (Signed)
Called pt no answer, LM for pt informing her of normal PAP

## 2016-10-30 ENCOUNTER — Ambulatory Visit
Admission: RE | Admit: 2016-10-30 | Discharge: 2016-10-30 | Disposition: A | Discharge status: Home / Self Care | Payer: Managed Care, Other (non HMO) | Source: Ambulatory Visit | Attending: Internal Medicine | Admitting: Internal Medicine

## 2016-10-30 DIAGNOSIS — Z23 Encounter for immunization: Secondary | ICD-10-CM | POA: Diagnosis not present

## 2016-10-30 DIAGNOSIS — Z1231 Encounter for screening mammogram for malignant neoplasm of breast: Secondary | ICD-10-CM | POA: Insufficient documentation

## 2016-10-30 DIAGNOSIS — Z1239 Encounter for other screening for malignant neoplasm of breast: Secondary | ICD-10-CM

## 2016-11-07 ENCOUNTER — Ambulatory Visit (INDEPENDENT_AMBULATORY_CARE_PROVIDER_SITE_OTHER): Payer: Managed Care, Other (non HMO) | Admitting: Internal Medicine

## 2016-11-07 ENCOUNTER — Encounter: Payer: Self-pay | Admitting: Internal Medicine

## 2016-11-07 DIAGNOSIS — E109 Type 1 diabetes mellitus without complications: Secondary | ICD-10-CM

## 2016-11-07 DIAGNOSIS — R0981 Nasal congestion: Secondary | ICD-10-CM

## 2016-11-07 DIAGNOSIS — Z720 Tobacco use: Secondary | ICD-10-CM | POA: Diagnosis not present

## 2016-11-07 DIAGNOSIS — E7800 Pure hypercholesterolemia, unspecified: Secondary | ICD-10-CM

## 2016-11-07 DIAGNOSIS — R42 Dizziness and giddiness: Secondary | ICD-10-CM

## 2016-11-07 DIAGNOSIS — I10 Essential (primary) hypertension: Secondary | ICD-10-CM

## 2016-11-07 DIAGNOSIS — F419 Anxiety disorder, unspecified: Secondary | ICD-10-CM

## 2016-11-07 NOTE — Progress Notes (Signed)
Patient ID: Jill Crosby, female   DOB: [birth year], 55 y.o.   MRN: [id number]   Subjective:    Patient ID: Jill Crosby, female    DOB: 04-Oct-1961, 55 y.o.   MRN: [id number]  HPI  Patient here for a scheduled follow up.  She reports that her sugars are doing better.  She brought in no recorded sugar readings.  States am sugars averaging 130-150 and her morning and noon time sugars are relatively the same.  Overall under 200.  States she had one day since her last visit that her sugar was elevated to 400.  This only occurred once and she is not sure why.  She is eating 3-4 small meals per.  She does report some left nasal congestion.  Left ear also feels a little stopped up.  No headache.  Has a history of vertigo.  Feels a little dizzy today.  Feels like the start of her vertigo.  No unusual symptoms.  No vision change.  No focal neuro changes.  She is eating.  States ate before coming in and did not take her insulin.  No nausea or vomiting.  Bowels stable.  Overall she feels things are stable.     Past Medical History:  Diagnosis Date  . Anxiety   . Depression   . Elevated BP   . Hypercholesterolemia   . Increased BMI   . Menopause   . Type I diabetes mellitus ([place])    Past Surgical History:  Procedure Laterality Date  . CESAREAN SECTION  1997   Family History  Problem Relation Age of Onset  . Hypertension Mother   . Hyperlipidemia Mother   . Colon polyps Mother   . Cancer Father     lung  . Colon cancer Maternal Grandfather   . Diabetes Maternal Grandfather   . Breast cancer Maternal Grandmother   . Colon cancer Paternal Grandfather   . Breast cancer Maternal Aunt   . Heart disease Neg Hx   . Ovarian cancer Neg Hx    Social History   Social History  . Marital status: Single    Spouse name: N/A  . Number of children: N/A  . Years of education: N/A   Social History Main Topics  . Smoking status: Former Smoker    Packs/day: 0.50    Years: 20.00    Types:  Cigarettes  . Smokeless tobacco: Never Used  . Alcohol use No  . Drug use: No  . Sexual activity: Not Currently    Birth control/ protection: Post-menopausal   Other Topics Concern  . None   Social History Narrative  . None    Outpatient Encounter Prescriptions as of 11/07/2016  Medication Sig  . glucose blood (BAYER CONTOUR NEXT TEST) test strip Use as instructed  . HUMALOG KWIKPEN 100 UNIT/ML KiwkPen Inject subcutaneously 50  units per day  . Lancets MISC Use as directed  . LANTUS SOLOSTAR 100 UNIT/ML Solostar Pen Inject subcutaneously 55  units nightly  . lisinopril (PRINIVIL,ZESTRIL) 20 MG tablet Take 1 tablet (20 mg total) by mouth daily.  Marland Kitchen loratadine (CLARITIN) 10 MG tablet Take 10 mg by mouth daily.  . Multiple Vitamin (MULTIVITAMIN) capsule Take 1 capsule by mouth daily.   No facility-administered encounter medications on file as of 11/07/2016.     Review of Systems  Constitutional: Negative for appetite change and unexpected weight change.  HENT: Positive for congestion. Negative for sinus pressure.        Left ear  stopped up at times.    Respiratory: Negative for cough, shortness of breath and wheezing.   Cardiovascular: Negative for chest pain, palpitations and leg swelling.  Gastrointestinal: Negative for abdominal pain, diarrhea, nausea and vomiting.  Genitourinary: Negative for difficulty urinating and dysuria.  Musculoskeletal: Negative for back pain and joint swelling.  Skin: Negative for color change and rash.  Neurological: Positive for dizziness. Negative for headaches.  Psychiatric/Behavioral: Negative for agitation and dysphoric mood.       Objective:    Physical Exam  Constitutional: She appears well-developed and well-nourished. No distress.  HENT:  Nose: Nose normal.  Mouth/Throat: Oropharynx is clear and moist.  Neck: Neck supple. No thyromegaly present.  Cardiovascular: Normal rate and regular rhythm.   Pulmonary/Chest: Breath sounds normal.  No respiratory distress. She has no wheezes.  Abdominal: Soft. Bowel sounds are normal. There is no tenderness.  Musculoskeletal: She exhibits no edema or tenderness.  Lymphadenopathy:    She has no cervical adenopathy.  Skin: No rash noted. No erythema.  Psychiatric: She has a normal mood and affect. Her behavior is normal.    BP 120/82   Pulse 80   Temp 98.7 F (37.1 C) (Oral)   Ht '5\' 4"'  (1.626 m)   Wt 220 lb 3.2 oz (99.9 kg)   SpO2 97%   BMI 37.80 kg/m  Wt Readings from Last 3 Encounters:  11/07/16 220 lb 3.2 oz (99.9 kg)  10/09/16 220 lb (99.8 kg)  09/19/16 223 lb 3.2 oz (101.2 kg)     Lab Results  Component Value Date   WBC 9.8 12/21/2015   HGB 13.9 03/21/2013   HCT 44.2 12/21/2015   PLT 204 12/21/2015   GLUCOSE 119 (H) 09/12/2016   CHOL 239 (H) 09/12/2016   TRIG 199 (H) 09/12/2016   HDL 46 09/12/2016   LDLCALC 153 (H) 09/12/2016   ALT 17 09/12/2016   AST 15 09/12/2016   NA 141 09/12/2016   K 4.3 09/12/2016   CL 101 09/12/2016   CREATININE 0.89 09/12/2016   BUN 18 09/12/2016   CO2 27 09/12/2016   TSH 2.700 12/21/2015   HGBA1C 8.0 (H) 09/12/2016    Mm Digital Screening Bilateral  Result Date: 10/31/2016 CLINICAL DATA:  Screening. EXAM: DIGITAL SCREENING BILATERAL MAMMOGRAM WITH CAD COMPARISON:  Previous exam(s). ACR Breast Density Category a: The breast tissue is almost entirely fatty. FINDINGS: There are no findings suspicious for malignancy. Images were processed with CAD. IMPRESSION: No mammographic evidence of malignancy. A result letter of this screening mammogram will be mailed directly to the patient. RECOMMENDATION: Screening mammogram in one year. (Code:SM-B-01Y) BI-RADS CATEGORY  1: Negative. Electronically Signed   By: Margarette Canada M.D.   On: 10/31/2016 10:15       Assessment & Plan:   Problem List Items Addressed This Visit    Anxiety    Overall stable.  Refilled xanax.        Dizziness    Has a history of vertigo.  Faint episode of  dizziness today.  Discussed with her.  Discussed the need to stay hydrated.  She drank a bottle of water prior to leaving.  Blood sugar 201.  Follow.  Felt better prior to leaving.  Follow.        Essential hypertension    Blood pressure under good control.  Continue same medication regimen.  Follow pressures.  Follow metabolic panel.        Relevant Orders   CBC with Differential/Platelet   TSH  Hypercholesterolemia    Low cholesterol diet and exercise.  Follow lipid panel.        Relevant Orders   Hepatic function panel   Lipid panel   Nasal congestion    Nasal congestion as outlined.  Start flonase regularly.  Follow.        Tobacco use    Discussed the need to quit.        Type 1 diabetes mellitus (HCC)    On insulin.  Sugars as outlined.  Sugar check today - 201.  Follow sugars.  Continue diet adjustment and counting carbs.  Follow met b and a1c.        Relevant Orders   Hemoglobin N3I   Basic metabolic panel   Microalbumin / creatinine urine ratio       Einar Pheasant, MD

## 2016-11-07 NOTE — Progress Notes (Signed)
Pre visit review using our clinic review tool, if applicable. No additional management support is needed unless otherwise documented below in the visit note. 

## 2016-11-09 ENCOUNTER — Encounter: Payer: Self-pay | Admitting: Internal Medicine

## 2016-11-09 DIAGNOSIS — R0981 Nasal congestion: Secondary | ICD-10-CM | POA: Insufficient documentation

## 2016-11-09 DIAGNOSIS — R42 Dizziness and giddiness: Secondary | ICD-10-CM | POA: Insufficient documentation

## 2016-11-09 NOTE — Assessment & Plan Note (Signed)
Low cholesterol diet and exercise.  Follow lipid panel.   

## 2016-11-09 NOTE — Assessment & Plan Note (Signed)
Has a history of vertigo.  Faint episode of dizziness today.  Discussed with her.  Discussed the need to stay hydrated.  She drank a bottle of water prior to leaving.  Blood sugar 201.  Follow.  Felt better prior to leaving.  Follow.

## 2016-11-09 NOTE — Assessment & Plan Note (Signed)
On insulin.  Sugars as outlined.  Sugar check today - 201.  Follow sugars.  Continue diet adjustment and counting carbs.  Follow met b and a1c.

## 2016-11-09 NOTE — Assessment & Plan Note (Signed)
Nasal congestion as outlined.  Start flonase regularly.  Follow.

## 2016-11-09 NOTE — Assessment & Plan Note (Signed)
Overall stable.  Refilled xanax.

## 2016-11-09 NOTE — Assessment & Plan Note (Signed)
Discussed the need to quit.   

## 2016-11-09 NOTE — Assessment & Plan Note (Signed)
Blood pressure under good control.  Continue same medication regimen.  Follow pressures.  Follow metabolic panel.   

## 2017-01-01 ENCOUNTER — Telehealth: Payer: Self-pay | Admitting: *Deleted

## 2017-01-01 DIAGNOSIS — E538 Deficiency of other specified B group vitamins: Secondary | ICD-10-CM

## 2017-01-01 DIAGNOSIS — E7800 Pure hypercholesterolemia, unspecified: Secondary | ICD-10-CM

## 2017-01-01 DIAGNOSIS — E109 Type 1 diabetes mellitus without complications: Secondary | ICD-10-CM

## 2017-01-01 DIAGNOSIS — I10 Essential (primary) hypertension: Secondary | ICD-10-CM

## 2017-01-01 NOTE — Telephone Encounter (Signed)
Patient requested to have her labs orders placed to have it drawn at [facility]. Please call pt when this is completed  Pt contact [phone]

## 2017-01-02 NOTE — Telephone Encounter (Signed)
Orders placed for labs to be drawn at [facility].  Please notify pt.   

## 2017-01-02 NOTE — Telephone Encounter (Signed)
Patient advised of below  

## 2017-01-09 ENCOUNTER — Ambulatory Visit (INDEPENDENT_AMBULATORY_CARE_PROVIDER_SITE_OTHER): Payer: Managed Care, Other (non HMO) | Admitting: Internal Medicine

## 2017-01-09 ENCOUNTER — Encounter: Payer: Self-pay | Admitting: Internal Medicine

## 2017-01-09 VITALS — BP 118/88 | HR 89 | Temp 98.3°F | Ht 64.0 in | Wt 224.2 lb

## 2017-01-09 DIAGNOSIS — E109 Type 1 diabetes mellitus without complications: Secondary | ICD-10-CM

## 2017-01-09 DIAGNOSIS — E7800 Pure hypercholesterolemia, unspecified: Secondary | ICD-10-CM

## 2017-01-09 DIAGNOSIS — Z0000 Encounter for general adult medical examination without abnormal findings: Secondary | ICD-10-CM | POA: Diagnosis not present

## 2017-01-09 DIAGNOSIS — Z720 Tobacco use: Secondary | ICD-10-CM | POA: Diagnosis not present

## 2017-01-09 DIAGNOSIS — I10 Essential (primary) hypertension: Secondary | ICD-10-CM

## 2017-01-09 DIAGNOSIS — F419 Anxiety disorder, unspecified: Secondary | ICD-10-CM

## 2017-01-09 MED ORDER — INSULIN GLARGINE 100 UNIT/ML SOLOSTAR PEN: PEN_INJECTOR | SUBCUTANEOUS | 3 refills | Status: DC

## 2017-01-09 MED ORDER — LISINOPRIL 40 MG PO TABS: 40.0000 mg | ORAL_TABLET | Freq: Every day | ORAL | 3 refills | Status: DC

## 2017-01-09 MED ORDER — INSULIN LISPRO 100 UNIT/ML (KWIKPEN): PEN_INJECTOR | SUBCUTANEOUS | 3 refills | Status: DC

## 2017-01-09 NOTE — Assessment & Plan Note (Signed)
Physical today 01/09/17.  Mammogram 10/31/16 - Birads I.  Colonoscopy 2014.

## 2017-01-09 NOTE — Progress Notes (Signed)
Patient ID: Jill Crosby, female   DOB: September 04, 1961, 56 y.o.   MRN: [id number]   Subjective:    Patient ID: Jill Crosby, female    DOB: Nov 08, 1961, 56 y.o.   MRN: [id number]  HPI  Patient here for her physical exam.  She is doing well.  Feels good.  Monitoring her sugars.  States am sugars 100-130 and pm sugars 140.  No low sugars.  She is watching her diet.  Discussed exercise.  No chest pain.  No sob.  No acid reflux.  No abdominal pain.  Bowels stable.  No urine change.  Overall she feels she is doing well.     Past Medical History:  Diagnosis Date  . Anxiety   . Depression   . Elevated BP   . Hypercholesterolemia   . Increased BMI   . Menopause   . Type I diabetes mellitus ([place])    Past Surgical History:  Procedure Laterality Date  . CESAREAN SECTION  1997   Family History  Problem Relation Age of Onset  . Hypertension Mother   . Hyperlipidemia Mother   . Colon polyps Mother   . Cancer Father     lung  . Colon cancer Maternal Grandfather   . Diabetes Maternal Grandfather   . Breast cancer Maternal Grandmother   . Colon cancer Paternal Grandfather   . Breast cancer Maternal Aunt   . Heart disease Neg Hx   . Ovarian cancer Neg Hx    Social History   Social History  . Marital status: Single    Spouse name: N/A  . Number of children: N/A  . Years of education: N/A   Social History Main Topics  . Smoking status: Former Smoker    Packs/day: 0.50    Years: 20.00    Types: Cigarettes  . Smokeless tobacco: Never Used  . Alcohol use No  . Drug use: No  . Sexual activity: Not Currently    Birth control/ protection: Post-menopausal   Other Topics Concern  . None   Social History Narrative  . None    Outpatient Encounter Prescriptions as of 01/09/2017  Medication Sig  . glucose blood (BAYER CONTOUR NEXT TEST) test strip Use as instructed  . Insulin Glargine (LANTUS SOLOSTAR) 100 UNIT/ML Solostar Pen Inject subcutaneously 50  units nightly  . insulin  lispro (HUMALOG KWIKPEN) 100 UNIT/ML KiwkPen Inject subcutaneously 50  units per day  . Lancets MISC Use as directed  . loratadine (CLARITIN) 10 MG tablet Take 10 mg by mouth daily.  . Multiple Vitamin (MULTIVITAMIN) capsule Take 1 capsule by mouth daily.  . [DISCONTINUED] HUMALOG KWIKPEN 100 UNIT/ML KiwkPen Inject subcutaneously 50  units per day  . [DISCONTINUED] LANTUS SOLOSTAR 100 UNIT/ML Solostar Pen Inject subcutaneously 55  units nightly  . [DISCONTINUED] lisinopril (PRINIVIL,ZESTRIL) 20 MG tablet Take 1 tablet (20 mg total) by mouth daily.  Marland Kitchen lisinopril (PRINIVIL,ZESTRIL) 40 MG tablet Take 1 tablet (40 mg total) by mouth daily.   No facility-administered encounter medications on file as of 01/09/2017.     Review of Systems  Constitutional: Negative for appetite change and unexpected weight change.  HENT: Negative for congestion and sinus pressure.   Eyes: Negative for pain and visual disturbance.  Respiratory: Negative for cough, chest tightness and shortness of breath.   Cardiovascular: Negative for chest pain, palpitations and leg swelling.  Gastrointestinal: Negative for abdominal pain, diarrhea, nausea and vomiting.  Genitourinary: Negative for difficulty urinating and dysuria.  Musculoskeletal: Negative for back  pain and joint swelling.  Skin: Negative for color change and rash.  Neurological: Negative for dizziness, light-headedness and headaches.  Hematological: Negative for adenopathy. Does not bruise/bleed easily.  Psychiatric/Behavioral: Negative for agitation and dysphoric mood.       Objective:    Physical Exam  Constitutional: She is oriented to person, place, and time. She appears well-developed and well-nourished. No distress.  HENT:  Nose: Nose normal.  Mouth/Throat: Oropharynx is clear and moist.  Eyes: Right eye exhibits no discharge. Left eye exhibits no discharge. No scleral icterus.  Neck: Neck supple. No thyromegaly present.  Cardiovascular: Normal  rate and regular rhythm.   Pulmonary/Chest: Breath sounds normal. No accessory muscle usage. No tachypnea. No respiratory distress. She has no decreased breath sounds. She has no wheezes. She has no rhonchi. Right breast exhibits no inverted nipple, no mass, no nipple discharge and no tenderness (no axillary adenopathy). Left breast exhibits no inverted nipple, no mass, no nipple discharge and no tenderness (no axilarry adenopathy).  Abdominal: Soft. Bowel sounds are normal. There is no tenderness.  Musculoskeletal: She exhibits no edema or tenderness.  Lymphadenopathy:    She has no cervical adenopathy.  Neurological: She is alert and oriented to person, place, and time.  Skin: Skin is warm. No rash noted. No erythema.  Psychiatric: She has a normal mood and affect. Her behavior is normal.    BP 118/88   Pulse 89   Temp 98.3 F (36.8 C) (Oral)   Ht '5\' 4"'  (1.626 m)   Wt 224 lb 3.2 oz (101.7 kg)   SpO2 97%   BMI 38.48 kg/m  Wt Readings from Last 3 Encounters:  01/09/17 224 lb 3.2 oz (101.7 kg)  11/07/16 220 lb 3.2 oz (99.9 kg)  10/09/16 220 lb (99.8 kg)     Lab Results  Component Value Date   WBC 12.0 (H) 01/09/2017   HGB 13.9 03/21/2013   HCT 44.2 01/09/2017   PLT 220 01/09/2017   GLUCOSE 46 (L) 01/09/2017   CHOL 241 (H) 01/09/2017   TRIG 203 (H) 01/09/2017   HDL 50 01/09/2017   LDLCALC 150 (H) 01/09/2017   ALT 15 01/09/2017   AST 15 01/09/2017   NA 144 01/09/2017   K 4.4 01/09/2017   CL 105 01/09/2017   CREATININE 0.76 01/09/2017   BUN 14 01/09/2017   CO2 28 01/09/2017   TSH 1.760 01/09/2017   HGBA1C 7.4 (H) 01/09/2017    Mm Digital Screening Bilateral  Result Date: 10/31/2016 CLINICAL DATA:  Screening. EXAM: DIGITAL SCREENING BILATERAL MAMMOGRAM WITH CAD COMPARISON:  Previous exam(s). ACR Breast Density Category a: The breast tissue is almost entirely fatty. FINDINGS: There are no findings suspicious for malignancy. Images were processed with CAD. IMPRESSION: No  mammographic evidence of malignancy. A result letter of this screening mammogram will be mailed directly to the patient. RECOMMENDATION: Screening mammogram in one year. (Code:SM-B-01Y) BI-RADS CATEGORY  1: Negative. Electronically Signed   By: Margarette Canada M.D.   On: 10/31/2016 10:15       Assessment & Plan:   Problem List Items Addressed This Visit    Anxiety    Overall stable.  Xanax.        Essential hypertension    Blood pressure as outlined.  Increased on recheck.  Has been running a little elevated.   Will increase lisinopril to 16m q day.  Follow pressures.  Get hre back in soon to reassess.        Relevant Medications  lisinopril (PRINIVIL,ZESTRIL) 40 MG tablet   Health care maintenance    Physical today 01/09/17.  Mammogram 10/31/16 - Birads I.  Colonoscopy 2014.        Hypercholesterolemia    Low cholesterol diet and exercise.  Have discussed last cholesterol results. She wants to work on diet and exercise.        Relevant Medications   lisinopril (PRINIVIL,ZESTRIL) 40 MG tablet   Tobacco use    Have discussed the need to quit.  Follow.        Type 1 diabetes mellitus (HCC)    Sugars ad outlined.  She is watching her diet.  Up to date with eye checks.  Follow met b and a1c.        Relevant Medications   insulin lispro (HUMALOG KWIKPEN) 100 UNIT/ML KiwkPen   Insulin Glargine (LANTUS SOLOSTAR) 100 UNIT/ML Solostar Pen   lisinopril (PRINIVIL,ZESTRIL) 40 MG tablet    Other Visit Diagnoses    Routine general medical examination at a health care facility    -  Primary       Einar Pheasant, MD

## 2017-01-09 NOTE — Progress Notes (Signed)
Pre visit review using our clinic review tool, if applicable. No additional management support is needed unless otherwise documented below in the visit note. 

## 2017-01-11 LAB — HEPATIC FUNCTION PANEL
ALT: 15 IU/L (ref 0–32)
AST: 15 IU/L (ref 0–40)
Albumin: 4.1 g/dL (ref 3.5–5.5)
Alkaline Phosphatase: 57 IU/L (ref 39–117)
Bilirubin Total: 0.3 mg/dL (ref 0.0–1.2)
Bilirubin, Direct: 0.1 mg/dL (ref 0.00–0.40)
Total Protein: 6.8 g/dL (ref 6.0–8.5)

## 2017-01-11 LAB — BASIC METABOLIC PANEL
BUN/Creatinine Ratio: 18 (ref 9–23)
BUN: 14 mg/dL (ref 6–24)
CALCIUM: 9.7 mg/dL (ref 8.7–10.2)
CO2: 28 mmol/L (ref 18–29)
CREATININE: 0.76 mg/dL (ref 0.57–1.00)
Chloride: 105 mmol/L (ref 96–106)
GFR, EST AFRICAN AMERICAN: 101 mL/min/{1.73_m2} (ref >59)
GFR, EST NON AFRICAN AMERICAN: 87 mL/min/{1.73_m2} (ref >59)
Glucose: 46 mg/dL — ABNORMAL LOW (ref 65–99)
POTASSIUM: 4.4 mmol/L (ref 3.5–5.2)
Sodium: 144 mmol/L (ref 134–144)

## 2017-01-11 LAB — CBC WITH DIFFERENTIAL/PLATELET
BASOS ABS: 0.1 10*3/uL (ref 0.0–0.2)
Basos: 1 %
EOS (ABSOLUTE): 0.5 10*3/uL — ABNORMAL HIGH (ref 0.0–0.4)
Eos: 4 %
HEMOGLOBIN: 14.6 g/dL (ref 11.1–15.9)
Hematocrit: 44.2 % (ref 34.0–46.6)
Immature Grans (Abs): 0 10*3/uL (ref 0.0–0.1)
Immature Granulocytes: 0 %
Lymphocytes Absolute: 3.3 10*3/uL — ABNORMAL HIGH (ref 0.7–3.1)
Lymphs: 27 %
MCH: 30.2 pg (ref 26.6–33.0)
MCHC: 33 g/dL (ref 31.5–35.7)
MCV: 92 fL (ref 79–97)
MONOCYTES: 7 %
MONOS ABS: 0.9 10*3/uL (ref 0.1–0.9)
NEUTROS PCT: 61 %
Neutrophils Absolute: 7.2 10*3/uL — ABNORMAL HIGH (ref 1.4–7.0)
Platelets: 220 10*3/uL (ref 150–379)
RBC: 4.83 x10E6/uL (ref 3.77–5.28)
RDW: 14 % (ref 12.3–15.4)
WBC: 12 10*3/uL — AB (ref 3.4–10.8)

## 2017-01-11 LAB — LIPID PANEL
CHOLESTEROL TOTAL: 241 mg/dL — AB (ref 100–199)
Chol/HDL Ratio: 4.8 ratio units — ABNORMAL HIGH (ref 0.0–4.4)
HDL: 50 mg/dL (ref >39)
LDL Calculated: 150 mg/dL — ABNORMAL HIGH (ref 0–99)
Triglycerides: 203 mg/dL — ABNORMAL HIGH (ref 0–149)
VLDL CHOLESTEROL CAL: 41 mg/dL — AB (ref 5–40)

## 2017-01-11 LAB — HEMOGLOBIN A1C
ESTIMATED AVERAGE GLUCOSE: 166 mg/dL
HEMOGLOBIN A1C: 7.4 % — AB (ref 4.8–5.6)

## 2017-01-11 LAB — TSH: TSH: 1.76 u[IU]/mL (ref 0.450–4.500)

## 2017-01-11 LAB — MICROALBUMIN / CREATININE URINE RATIO
Creatinine, Urine: 39.5 mg/dL
Microalb/Creat Ratio: <7.6 mg/g creat (ref 0.0–30.0)

## 2017-01-11 LAB — VITAMIN B12: VITAMIN B 12: 358 pg/mL (ref 232–1245)

## 2017-01-18 ENCOUNTER — Encounter: Payer: Self-pay | Admitting: Internal Medicine

## 2017-01-18 NOTE — Assessment & Plan Note (Signed)
Overall stable.  Xanax.

## 2017-01-18 NOTE — Assessment & Plan Note (Signed)
Have discussed the need to quit.  Follow.  

## 2017-01-18 NOTE — Assessment & Plan Note (Signed)
Sugars ad outlined.  She is watching her diet.  Up to date with eye checks.  Follow met b and a1c.

## 2017-01-18 NOTE — Assessment & Plan Note (Signed)
Blood pressure as outlined.  Increased on recheck.  Has been running a little elevated.   Will increase lisinopril to 40mg  q day.  Follow pressures.  Get hre back in soon to reassess.

## 2017-01-18 NOTE — Assessment & Plan Note (Signed)
Low cholesterol diet and exercise.  Have discussed last cholesterol results. She wants to work on diet and exercise.

## 2017-01-26 ENCOUNTER — Encounter: Payer: Self-pay | Admitting: *Deleted

## 2017-01-26 NOTE — Progress Notes (Signed)
Letter mailed due to not able to reach patient by phone.

## 2017-03-20 ENCOUNTER — Ambulatory Visit (INDEPENDENT_AMBULATORY_CARE_PROVIDER_SITE_OTHER): Payer: Managed Care, Other (non HMO) | Admitting: Internal Medicine

## 2017-03-20 ENCOUNTER — Encounter: Payer: Self-pay | Admitting: Internal Medicine

## 2017-03-20 DIAGNOSIS — I10 Essential (primary) hypertension: Secondary | ICD-10-CM | POA: Diagnosis not present

## 2017-03-20 DIAGNOSIS — E109 Type 1 diabetes mellitus without complications: Secondary | ICD-10-CM

## 2017-03-20 DIAGNOSIS — F419 Anxiety disorder, unspecified: Secondary | ICD-10-CM

## 2017-03-20 DIAGNOSIS — E7800 Pure hypercholesterolemia, unspecified: Secondary | ICD-10-CM

## 2017-03-20 DIAGNOSIS — Z720 Tobacco use: Secondary | ICD-10-CM | POA: Diagnosis not present

## 2017-03-20 MED ORDER — SERTRALINE HCL 50 MG PO TABS: ORAL_TABLET | ORAL | 1 refills | Status: DC

## 2017-03-20 MED ORDER — HYDROCHLOROTHIAZIDE 12.5 MG PO CAPS: 12.5000 mg | ORAL_CAPSULE | Freq: Every day | ORAL | 2 refills | Status: DC

## 2017-03-20 NOTE — Progress Notes (Signed)
Patient ID: Jill Crosby, female   DOB: February 23, 1961, 56 y.o.   MRN: [id number]   Subjective:    Patient ID: Jill Crosby, female    DOB: 03/16/1961, 56 y.o.   MRN: [id number]  HPI  Patient here for a scheduled follow up.  She reports her sugars have been doing better.  AM sugars averaging 120-130 and after meal sugars are averaging 160s.  Discussed diet and exercise.  She is trying to watch what she is eating.  No low blood sugars.  On lisinopril 9m daily now.  Blood pressures still slightly increased. No chest pain.  No sob.  No acid reflux.  No abdominal pain.  Bowels moving.  Increased stress and anxiety.  Discussed with her today.  She feels she needs to be on something to help level things out.      Past Medical History:  Diagnosis Date  . Anxiety   . Depression   . Elevated BP   . Hypercholesterolemia   . Increased BMI   . Menopause   . [place] I diabetes mellitus [place]    Past Surgical History:  Procedure Laterality Date  . CESAREAN SECTION  1997   Family History  Problem Relation Age of Onset  . Hypertension Mother   . Hyperlipidemia Mother   . Colon polyps Mother   . Cancer Father     lung  . Colon cancer Maternal Grandfather   . Diabetes Maternal Grandfather   . Breast cancer Maternal Grandmother   . Colon cancer Paternal Grandfather   . Breast cancer Maternal Aunt   . Heart disease Neg Hx   . Ovarian cancer Neg Hx    Social History   Social History  . Marital status: Single    Spouse name: N/A  . Number of children: N/A  . Years of education: N/A   Social History Main Topics  . Smoking status: Former Smoker    Packs/day: 0.50    Years: 20.00    Types: Cigarettes  . Smokeless tobacco: Never Used  . Alcohol use No  . Drug use: No  . Sexual activity: Not Currently    Birth control/ protection: Post-menopausal   Other Topics Concern  . None   Social History Narrative  . None    Outpatient Encounter Prescriptions as of 03/20/2017  Medication  Sig  . glucose blood (BAYER CONTOUR NEXT TEST) test strip Use as instructed  . Insulin Glargine (LANTUS SOLOSTAR) 100 UNIT/ML Solostar Pen Inject subcutaneously 50  units nightly  . insulin lispro (HUMALOG KWIKPEN) 100 UNIT/ML KiwkPen Inject subcutaneously 50  units per day  . Lancets MISC Use as directed  . lisinopril (PRINIVIL,ZESTRIL) 40 MG tablet Take 1 tablet (40 mg total) by mouth daily.  .Marland Kitchenloratadine (CLARITIN) 10 MG tablet Take 10 mg by mouth daily.  . Multiple Vitamin (MULTIVITAMIN) capsule Take 1 capsule by mouth daily.  . hydrochlorothiazide (MICROZIDE) 12.5 MG capsule Take 1 capsule (12.5 mg total) by mouth daily.  . sertraline (ZOLOFT) 50 MG tablet Take 1/2 tablet x 1 week and then one po q day.   No facility-administered encounter medications on file as of 03/20/2017.     Review of Systems  Constitutional: Negative for appetite change and unexpected weight change.  HENT: Negative for congestion and sinus pressure.   Respiratory: Negative for cough, chest tightness and shortness of breath.   Cardiovascular: Negative for chest pain, palpitations and leg swelling.  Gastrointestinal: Negative for abdominal pain, diarrhea, nausea and vomiting.  Genitourinary: Negative for difficulty urinating and dysuria.  Musculoskeletal: Negative for back pain and joint swelling.  Skin: Negative for color change and rash.  Neurological: Negative for dizziness, light-headedness and headaches.  Psychiatric/Behavioral:       Increased stress and anxiety as outlined.         Objective:    Physical Exam  Constitutional: She appears well-developed and well-nourished. No distress.  HENT:  Nose: Nose normal.  Mouth/Throat: Oropharynx is clear and moist.  Neck: Neck supple. No thyromegaly present.  Cardiovascular: Normal rate and regular rhythm.   Pulmonary/Chest: Breath sounds normal. No respiratory distress. She has no wheezes.  Abdominal: Soft. Bowel sounds are normal. There is no  tenderness.  Musculoskeletal: She exhibits no edema or tenderness.  Lymphadenopathy:    She has no cervical adenopathy.  Skin: No rash noted. No erythema.  Psychiatric: She has a normal mood and affect. Her behavior is normal.    BP (!) 162/84 (BP Location: Right Arm, Cuff Size: Large)   Pulse 82   Temp 98.6 F (37 C) (Oral)   Resp 12   Ht '5\' 4"'  (1.626 m)   Wt 227 lb (103 kg)   SpO2 97%   BMI 38.96 kg/m  Wt Readings from Last 3 Encounters:  03/20/17 227 lb (103 kg)  01/09/17 224 lb 3.2 oz (101.7 kg)  11/07/16 220 lb 3.2 oz (99.9 kg)     Lab Results  Component Value Date   WBC 12.0 (H) 01/09/2017   HGB 13.9 03/21/2013   HCT 44.2 01/09/2017   PLT 220 01/09/2017   GLUCOSE 46 (L) 01/09/2017   CHOL 241 (H) 01/09/2017   TRIG 203 (H) 01/09/2017   HDL 50 01/09/2017   LDLCALC 150 (H) 01/09/2017   ALT 15 01/09/2017   AST 15 01/09/2017   NA 144 01/09/2017   K 4.4 01/09/2017   CL 105 01/09/2017   CREATININE 0.76 01/09/2017   BUN 14 01/09/2017   CO2 28 01/09/2017   TSH 1.760 01/09/2017   HGBA1C 7.4 (H) 01/09/2017    Mm Digital Screening Bilateral  Result Date: 10/31/2016 CLINICAL DATA:  Screening. EXAM: DIGITAL SCREENING BILATERAL MAMMOGRAM WITH CAD COMPARISON:  Previous exam(s). ACR Breast Density Category a: The breast tissue is almost entirely fatty. FINDINGS: There are no findings suspicious for malignancy. Images were processed with CAD. IMPRESSION: No mammographic evidence of malignancy. A result letter of this screening mammogram will be mailed directly to the patient. RECOMMENDATION: Screening mammogram in one year. (Code:SM-B-01Y) BI-RADS CATEGORY  1: Negative. Electronically Signed   By: Margarette Canada M.D.   On: 10/31/2016 10:15       Assessment & Plan:   Problem List Items Addressed This Visit    Anxiety    Increased stress.  Discussed with her today.  Feels she needs to be on something to help level things out.  Start zoloft as directed.  Follow.         Relevant Medications   sertraline (ZOLOFT) 50 MG tablet   Essential hypertension    Blood pressure remains elevated.  She feels that treating stress will help. Start zoloft.  Add hctz 12.82m q day.  Follow pressures.  Follow metabolic panel.        Relevant Medications   hydrochlorothiazide (MICROZIDE) 12.5 MG capsule   Other Relevant Orders   CBC with Differential/Platelet   Hypercholesterolemia    Low cholesterol diet and exercise.  Discussed cholesterol medication. She has had problems with multiple statin medications and  zetia.  Discussed welchol.  She declines.   Follow lipid panel.        Relevant Medications   hydrochlorothiazide (MICROZIDE) 12.5 MG capsule   Other Relevant Orders   Hepatic function panel   Lipid panel   Tobacco use    Discussed the need to quit.  Follow.        Type 1 diabetes mellitus (HCC)    Sugars as outlined.  Doing better.  Watching her diet.  Discussed diet and exercise.  Follow met b and a1c.  Hold on changing medications.  Discussed need for eye exams.        Relevant Orders   Hemoglobin A7J   Basic metabolic panel       Einar Pheasant, MD

## 2017-03-20 NOTE — Progress Notes (Signed)
Pre-visit discussion using our clinic review tool. No additional management support is needed unless otherwise documented below in the visit note.  

## 2017-03-22 ENCOUNTER — Encounter: Payer: Self-pay | Admitting: Internal Medicine

## 2017-03-22 NOTE — Assessment & Plan Note (Addendum)
Low cholesterol diet and exercise.  Discussed cholesterol medication. She has had problems with multiple statin medications and zetia.  Discussed welchol.  She declines.   Follow lipid panel.

## 2017-03-22 NOTE — Assessment & Plan Note (Signed)
Blood pressure remains elevated.  She feels that treating stress will help. Start zoloft.  Add hctz 12.5mg  q day.  Follow pressures.  Follow metabolic panel.

## 2017-03-22 NOTE — Assessment & Plan Note (Signed)
Sugars as outlined.  Doing better.  Watching her diet.  Discussed diet and exercise.  Follow met b and a1c.  Hold on changing medications.  Discussed need for eye exams.

## 2017-03-22 NOTE — Assessment & Plan Note (Signed)
Discussed the need to quit.  Follow.  

## 2017-03-22 NOTE — Assessment & Plan Note (Signed)
Increased stress.  Discussed with her today.  Feels she needs to be on something to help level things out.  Start zoloft as directed.  Follow.

## 2017-04-10 ENCOUNTER — Ambulatory Visit: Payer: Managed Care, Other (non HMO) | Admitting: Internal Medicine

## 2017-05-09 LAB — HEPATIC FUNCTION PANEL
ALBUMIN: 4.2 g/dL (ref 3.5–5.5)
ALK PHOS: 62 IU/L (ref 39–117)
ALT: 12 IU/L (ref 0–32)
AST: 16 IU/L (ref 0–40)
Bilirubin Total: 0.4 mg/dL (ref 0.0–1.2)
Bilirubin, Direct: 0.09 mg/dL (ref 0.00–0.40)
Total Protein: 6.8 g/dL (ref 6.0–8.5)

## 2017-05-09 LAB — CBC WITH DIFFERENTIAL/PLATELET
BASOS: 0 %
Basophils Absolute: 0 10*3/uL (ref 0.0–0.2)
EOS (ABSOLUTE): 0.2 10*3/uL (ref 0.0–0.4)
Eos: 2 %
Hematocrit: 42.3 % (ref 34.0–46.6)
Hemoglobin: 14.3 g/dL (ref 11.1–15.9)
IMMATURE GRANS (ABS): 0 10*3/uL (ref 0.0–0.1)
Immature Granulocytes: 0 %
Lymphocytes Absolute: 3 10*3/uL (ref 0.7–3.1)
Lymphs: 29 %
MCH: 30.6 pg (ref 26.6–33.0)
MCHC: 33.8 g/dL (ref 31.5–35.7)
MCV: 91 fL (ref 79–97)
MONOS ABS: 0.8 10*3/uL (ref 0.1–0.9)
Monocytes: 8 %
NEUTROS ABS: 6.2 10*3/uL (ref 1.4–7.0)
Neutrophils: 61 %
PLATELETS: 252 10*3/uL (ref 150–379)
RBC: 4.67 x10E6/uL (ref 3.77–5.28)
RDW: 13.9 % (ref 12.3–15.4)
WBC: 10.2 10*3/uL (ref 3.4–10.8)

## 2017-05-09 LAB — BASIC METABOLIC PANEL
BUN / CREAT RATIO: 8 — AB (ref 9–23)
BUN: 7 mg/dL (ref 6–24)
CHLORIDE: 102 mmol/L (ref 96–106)
CO2: 23 mmol/L (ref 18–29)
Calcium: 9.5 mg/dL (ref 8.7–10.2)
Creatinine, Ser: 0.87 mg/dL (ref 0.57–1.00)
GFR calc Af Amer: 85 mL/min/{1.73_m2} (ref >59)
GFR calc non Af Amer: 74 mL/min/{1.73_m2} (ref >59)
GLUCOSE: 59 mg/dL — AB (ref 65–99)
Potassium: 4.1 mmol/L (ref 3.5–5.2)
SODIUM: 140 mmol/L (ref 134–144)

## 2017-05-09 LAB — LIPID PANEL
CHOL/HDL RATIO: 6 ratio — AB (ref 0.0–4.4)
Cholesterol, Total: 244 mg/dL — ABNORMAL HIGH (ref 100–199)
HDL: 41 mg/dL (ref >39)
LDL Calculated: 145 mg/dL — ABNORMAL HIGH (ref 0–99)
TRIGLYCERIDES: 288 mg/dL — AB (ref 0–149)
VLDL CHOLESTEROL CAL: 58 mg/dL — AB (ref 5–40)

## 2017-05-09 LAB — HEMOGLOBIN A1C
Est. average glucose Bld gHb Est-mCnc: 163 mg/dL
Hgb A1c MFr Bld: 7.3 % — ABNORMAL HIGH (ref 4.8–5.6)

## 2017-05-14 ENCOUNTER — Other Ambulatory Visit: Payer: Self-pay | Admitting: Internal Medicine

## 2017-05-15 ENCOUNTER — Ambulatory Visit (INDEPENDENT_AMBULATORY_CARE_PROVIDER_SITE_OTHER): Payer: Managed Care, Other (non HMO) | Admitting: Internal Medicine

## 2017-05-15 ENCOUNTER — Encounter: Payer: Self-pay | Admitting: Internal Medicine

## 2017-05-15 DIAGNOSIS — I10 Essential (primary) hypertension: Secondary | ICD-10-CM | POA: Diagnosis not present

## 2017-05-15 DIAGNOSIS — F419 Anxiety disorder, unspecified: Secondary | ICD-10-CM | POA: Diagnosis not present

## 2017-05-15 DIAGNOSIS — Z720 Tobacco use: Secondary | ICD-10-CM

## 2017-05-15 DIAGNOSIS — E109 Type 1 diabetes mellitus without complications: Secondary | ICD-10-CM

## 2017-05-15 DIAGNOSIS — E669 Obesity, unspecified: Secondary | ICD-10-CM

## 2017-05-15 DIAGNOSIS — E7800 Pure hypercholesterolemia, unspecified: Secondary | ICD-10-CM | POA: Diagnosis not present

## 2017-05-15 DIAGNOSIS — E66812 Obesity, class 2: Secondary | ICD-10-CM

## 2017-05-15 MED ORDER — SERTRALINE HCL 50 MG PO TABS: 50.0000 mg | ORAL_TABLET | Freq: Every day | ORAL | 1 refills | Status: DC

## 2017-05-15 MED ORDER — INSULIN GLARGINE 100 UNIT/ML SOLOSTAR PEN: PEN_INJECTOR | SUBCUTANEOUS | 3 refills | Status: DC

## 2017-05-15 MED ORDER — EZETIMIBE 10 MG PO TABS: 10.0000 mg | ORAL_TABLET | Freq: Every day | ORAL | 1 refills | Status: DC

## 2017-05-15 MED ORDER — HYDROCHLOROTHIAZIDE 25 MG PO TABS: 25.0000 mg | ORAL_TABLET | Freq: Every day | ORAL | 1 refills | Status: DC

## 2017-05-15 NOTE — Progress Notes (Signed)
Patient ID: Jill Crosby, female   DOB: February 17, 1961, 56 y.o.   MRN: [id number]   Subjective:    Patient ID: Jill Crosby, female    DOB: [birth year], 56 y.o.   MRN: [id number]  HPI  Patient here for a scheduled follow up.  She reports she is doing relatively well.  Increased stress with family issues she is dealing with currently.  She feels this is why her blood pressure is elevated.  States it has been averaging 809-983 systolic readings.  She is tolerating the addition of HCTZ.  No chest pain.  Breathing stable.  No acid reflux.  No abdominal pain.  Bowels moving.  Unable to take cholesterol medication.  She has tried multiple statin medications and they all elevate her liver enzymes.  She has taken and tolerated zetia previously.  She is trying to watch her diet.  We discussed diet and exercise.  Discussed lab results.     Past Medical History:  Diagnosis Date  . Anxiety   . Depression   . Elevated BP   . Hypercholesterolemia   . Increased BMI   . Menopause   . Type I diabetes mellitus ([place])    Past Surgical History:  Procedure Laterality Date  . CESAREAN SECTION  1997   Family History  Problem Relation Age of Onset  . Hypertension Mother   . Hyperlipidemia Mother   . Colon polyps Mother   . Cancer Father        lung  . Colon cancer Maternal Grandfather   . Diabetes Maternal Grandfather   . Breast cancer Maternal Grandmother   . Colon cancer Paternal Grandfather   . Breast cancer Maternal Aunt   . Heart disease Neg Hx   . Ovarian cancer Neg Hx    Social History   Social History  . Marital status: Single    Spouse name: N/A  . Number of children: N/A  . Years of education: N/A   Social History Main Topics  . Smoking status: Former Smoker    Packs/day: 0.50    Years: 20.00    Types: Cigarettes  . Smokeless tobacco: Never Used  . Alcohol use No  . Drug use: No  . Sexual activity: Not Currently    Birth control/ protection: Post-menopausal   Other Topics  Concern  . None   Social History Narrative  . None    Outpatient Encounter Prescriptions as of 05/15/2017  Medication Sig  . glucose blood (BAYER CONTOUR NEXT TEST) test strip Use as instructed  . Insulin Glargine (LANTUS SOLOSTAR) 100 UNIT/ML Solostar Pen Inject subcutaneously 50  units nightly  . insulin lispro (HUMALOG KWIKPEN) 100 UNIT/ML KiwkPen Inject subcutaneously 50  units per day  . Lancets MISC Use as directed  . lisinopril (PRINIVIL,ZESTRIL) 40 MG tablet Take 1 tablet (40 mg total) by mouth daily.  Marland Kitchen loratadine (CLARITIN) 10 MG tablet Take 10 mg by mouth daily.  . Multiple Vitamin (MULTIVITAMIN) capsule Take 1 capsule by mouth daily.  . [DISCONTINUED] hydrochlorothiazide (MICROZIDE) 12.5 MG capsule Take 1 capsule (12.5 mg total) by mouth daily.  . [DISCONTINUED] Insulin Glargine (LANTUS SOLOSTAR) 100 UNIT/ML Solostar Pen Inject subcutaneously 50  units nightly  . [DISCONTINUED] sertraline (ZOLOFT) 50 MG tablet Take 1/2 tablet x 1 week and then one po q day.  . ezetimibe (ZETIA) 10 MG tablet Take 1 tablet (10 mg total) by mouth daily.  . hydrochlorothiazide (HYDRODIURIL) 25 MG tablet Take 1 tablet (25 mg total) by mouth daily.  Marland Kitchen  sertraline (ZOLOFT) 50 MG tablet Take 1 tablet (50 mg total) by mouth daily.   No facility-administered encounter medications on file as of 05/15/2017.     Review of Systems  Constitutional: Negative for appetite change and unexpected weight change.  HENT: Negative for congestion and sinus pressure.   Respiratory: Negative for cough, chest tightness and shortness of breath.   Cardiovascular: Negative for chest pain, palpitations and leg swelling.  Gastrointestinal: Negative for abdominal pain, diarrhea, nausea and vomiting.  Genitourinary: Negative for difficulty urinating and dysuria.  Musculoskeletal: Negative for back pain and joint swelling.  Skin: Negative for color change and rash.  Neurological: Negative for dizziness, light-headedness and  headaches.  Psychiatric/Behavioral: Negative for dysphoric mood.       Increased stress as outlined.         Objective:     Blood pressure rechecked by me:  158/72  Physical Exam  Constitutional: She appears well-developed and well-nourished. No distress.  HENT:  Nose: Nose normal.  Mouth/Throat: Oropharynx is clear and moist.  Neck: Neck supple. No thyromegaly present.  Cardiovascular: Normal rate and regular rhythm.   Pulmonary/Chest: Breath sounds normal. No respiratory distress. She has no wheezes.  Abdominal: Soft. Bowel sounds are normal. There is no tenderness.  Musculoskeletal: She exhibits no edema or tenderness.  Lymphadenopathy:    She has no cervical adenopathy.  Skin: No rash noted. No erythema.  Psychiatric: She has a normal mood and affect. Her behavior is normal.    BP (!) 162/66   Pulse 90   Temp 98.3 F (36.8 C) (Oral)   Resp 16   Ht _0  (1.626 m)   Wt 225 lb (102.1 kg)   SpO2 96%   BMI 38.62 kg/m  Wt Readings from Last 3 Encounters:  05/15/17 225 lb (102.1 kg)  03/20/17 227 lb (103 kg)  01/09/17 224 lb 3.2 oz (101.7 kg)     Lab Results  Component Value Date   WBC 10.2 05/08/2017   HGB 14.3 05/08/2017   HCT 42.3 05/08/2017   PLT 252 05/08/2017   GLUCOSE 59 (L) 05/08/2017   CHOL 244 (H) 05/08/2017   TRIG 288 (H) 05/08/2017   HDL 41 05/08/2017   LDLCALC 145 (H) 05/08/2017   ALT 12 05/08/2017   AST 16 05/08/2017   NA 140 05/08/2017   K 4.1 05/08/2017   CL 102 05/08/2017   CREATININE 0.87 05/08/2017   BUN 7 05/08/2017   CO2 23 05/08/2017   TSH 1.760 01/09/2017   HGBA1C 7.3 (H) 05/08/2017    Mm Digital Screening Bilateral  Result Date: 10/31/2016 CLINICAL DATA:  Screening. EXAM: DIGITAL SCREENING BILATERAL MAMMOGRAM WITH CAD COMPARISON:  Previous exam(s). ACR Breast Density Category a: The breast tissue is almost entirely fatty. FINDINGS: There are no findings suspicious for malignancy. Images were processed with CAD. IMPRESSION: No  mammographic evidence of malignancy. A result letter of this screening mammogram will be mailed directly to the patient. RECOMMENDATION: Screening mammogram in one year. (Code:SM-B-01Y) BI-RADS CATEGORY  1: Negative. Electronically Signed   By: Margarette Canada M.D.   On: 10/31/2016 10:15       Assessment & Plan:   Problem List Items Addressed This Visit    Anxiety    Increased stress as outlined.  Discussed with her today.  On zoloft.  Overall stable.  Follow.        Relevant Medications   sertraline (ZOLOFT) 50 MG tablet   Class 2 obesity    Diet  and exercise.  Follow.        Relevant Medications   Insulin Glargine (LANTUS SOLOSTAR) 100 UNIT/ML Solostar Pen   Essential hypertension    Blood pressure is still elevated.  Increase hctz to 65m q day.  Continue lisinopril.  Follow pressures.  Get her back in soon to reassess.        Relevant Medications   hydrochlorothiazide (HYDRODIURIL) 25 MG tablet   ezetimibe (ZETIA) 10 MG tablet   Hypercholesterolemia    Discussed her cholesterol results and treatment options.  She has tried multiple statin medications.  They all have caused her to have elevated liver enzymes.  Has taken zetia previously and tolerated.  Start zetia 148mq day.  Follow cholesterol.        Relevant Medications   hydrochlorothiazide (HYDRODIURIL) 25 MG tablet   ezetimibe (ZETIA) 10 MG tablet   Tobacco use    Discussed with her today the need to quit.  She declines to stop.  Follow.        Type 1 diabetes mellitus (HCC)    Low carb diet and exercise.  Discussed diet and exercise with her today.  Same medication regimen.  Follow met b and a1c.  Up to date with eye checks.       Relevant Medications   Insulin Glargine (LANTUS SOLOSTAR) 100 UNIT/ML Solostar Pen       SCEinar PheasantMD

## 2017-05-17 ENCOUNTER — Encounter: Payer: Self-pay | Admitting: Internal Medicine

## 2017-05-17 DIAGNOSIS — Z6838 Body mass index (BMI) 38.0-38.9, adult: Secondary | ICD-10-CM | POA: Insufficient documentation

## 2017-05-17 NOTE — Assessment & Plan Note (Signed)
Discussed her cholesterol results and treatment options.  She has tried multiple statin medications.  They all have caused her to have elevated liver enzymes.  Has taken zetia previously and tolerated.  Start zetia 10mg  q day.  Follow cholesterol.

## 2017-05-17 NOTE — Assessment & Plan Note (Signed)
Blood pressure is still elevated.  Increase hctz to 25mg  q day.  Continue lisinopril.  Follow pressures.  Get her back in soon to reassess.

## 2017-05-17 NOTE — Assessment & Plan Note (Signed)
Low carb diet and exercise.  Discussed diet and exercise with her today.  Same medication regimen.  Follow met b and a1c.  Up to date with eye checks.

## 2017-05-17 NOTE — Assessment & Plan Note (Signed)
Discussed with her today the need to quit.  She declines to stop.  Follow.

## 2017-05-17 NOTE — Assessment & Plan Note (Signed)
Diet and exercise.  Follow.  

## 2017-05-17 NOTE — Assessment & Plan Note (Signed)
Increased stress as outlined.  Discussed with her today.  On zoloft.  Overall stable.  Follow.

## 2017-05-22 ENCOUNTER — Other Ambulatory Visit: Payer: Self-pay | Admitting: Internal Medicine

## 2017-07-13 ENCOUNTER — Telehealth: Payer: Self-pay | Admitting: Internal Medicine

## 2017-07-13 NOTE — Telephone Encounter (Signed)
Pt called about her medication of lisinopril (PRINIVIL,ZESTRIL) 40 MG tablet. Pt wants to know should she be taking 40 mg or 10 mg? Please advise?  Call pt @ [phone]. Thank you!

## 2017-07-14 ENCOUNTER — Other Ambulatory Visit: Payer: Self-pay

## 2017-07-14 MED ORDER — LISINOPRIL 40 MG PO TABS: 40.0000 mg | ORAL_TABLET | Freq: Every day | ORAL | 3 refills | Status: DC

## 2017-07-14 NOTE — Telephone Encounter (Signed)
Called patient sates that she has been taking 40mg . Called in refill. States that bp have been around 130/70 every time

## 2017-07-14 NOTE — Telephone Encounter (Signed)
Both are listed on her med list but per note from 4/20 she should be on 40mg  is that right?

## 2017-07-14 NOTE — Telephone Encounter (Signed)
In review of her medication list, we have refilled 40mg .  Can confirm with pharmacy this is what she is receiving and confirm this is what she has been taking.  Also can make sure blood pressure doing ok.  Would continue on 40mg  if taking regularly and blood pressure ok.

## 2017-07-31 ENCOUNTER — Encounter: Payer: Self-pay | Admitting: Internal Medicine

## 2017-07-31 ENCOUNTER — Ambulatory Visit (INDEPENDENT_AMBULATORY_CARE_PROVIDER_SITE_OTHER): Payer: Managed Care, Other (non HMO) | Admitting: Internal Medicine

## 2017-07-31 DIAGNOSIS — Z720 Tobacco use: Secondary | ICD-10-CM

## 2017-07-31 DIAGNOSIS — I10 Essential (primary) hypertension: Secondary | ICD-10-CM

## 2017-07-31 DIAGNOSIS — E109 Type 1 diabetes mellitus without complications: Secondary | ICD-10-CM | POA: Diagnosis not present

## 2017-07-31 DIAGNOSIS — R0981 Nasal congestion: Secondary | ICD-10-CM | POA: Diagnosis not present

## 2017-07-31 DIAGNOSIS — E7800 Pure hypercholesterolemia, unspecified: Secondary | ICD-10-CM

## 2017-07-31 DIAGNOSIS — Z6838 Body mass index (BMI) 38.0-38.9, adult: Secondary | ICD-10-CM

## 2017-07-31 DIAGNOSIS — F419 Anxiety disorder, unspecified: Secondary | ICD-10-CM | POA: Diagnosis not present

## 2017-07-31 LAB — HM DIABETES FOOT EXAM

## 2017-07-31 MED ORDER — LEVOCETIRIZINE DIHYDROCHLORIDE 5 MG PO TABS: 5.0000 mg | ORAL_TABLET | Freq: Every evening | ORAL | 1 refills | Status: DC

## 2017-07-31 MED ORDER — LISINOPRIL 40 MG PO TABS: 40.0000 mg | ORAL_TABLET | Freq: Every day | ORAL | 1 refills | Status: DC

## 2017-07-31 NOTE — Progress Notes (Signed)
Patient ID: Jill Crosby, female   DOB: 09-25-1961, 56 y.o.   MRN: [id number]   Subjective:    Patient ID: Jill Crosby, female    DOB: 07-28-1961, 56 y.o.   MRN: [id number]  HPI  Patient here for a scheduled follow up.  She reports having problems with allergies.  Increased congestion in her eyes and increased post nasal drainage.  Some increased cough related to the drainage.  No chest congestion.  No sob.  Has tried claritin, Human resources officer and zyrtec.  States allegra works best of these three, but was questioning if there was another antihistamine she could use.  She ran out of her lisinopril last week.  Blood pressure is elevated.  No abdominal pain.  Bowels moving.  Discussed her sugars.  Discuss recent labs - through her work.  a1c 7.3.  Discussed low carb diet and exercise.  Discussed her needles and proper way to give her insulin.  Discussed her cholesterol labs.  She is taking zetia now and tolerating.  Did improve some.     Past Medical History:  Diagnosis Date  . Anxiety   . Depression   . Elevated BP   . Hypercholesterolemia   . Increased BMI   . Menopause   . Type I diabetes mellitus ([place])    Past Surgical History:  Procedure Laterality Date  . CESAREAN SECTION  1997   Family History  Problem Relation Age of Onset  . Hypertension Mother   . Hyperlipidemia Mother   . Colon polyps Mother   . Cancer Father        lung  . Colon cancer Maternal Grandfather   . Diabetes Maternal Grandfather   . Breast cancer Maternal Grandmother   . Colon cancer Paternal Grandfather   . Breast cancer Maternal Aunt   . Heart disease Neg Hx   . Ovarian cancer Neg Hx    Social History   Social History  . Marital status: Single    Spouse name: N/A  . Number of children: N/A  . Years of education: N/A   Social History Main Topics  . Smoking status: Former Smoker    Packs/day: 0.50    Years: 20.00    Types: Cigarettes  . Smokeless tobacco: Never Used  . Alcohol use No  . Drug  use: No  . Sexual activity: Not Currently    Birth control/ protection: Post-menopausal   Other Topics Concern  . None   Social History Narrative  . None    Outpatient Encounter Prescriptions as of 07/31/2017  Medication Sig  . BAYER MICROLET LANCETS lancets USE AS DIRECTED TO CHECK  SUGARS 1 TO 2 TIMES A DAY  . ezetimibe (ZETIA) 10 MG tablet Take 1 tablet (10 mg total) by mouth daily.  Marland Kitchen glucose blood (BAYER CONTOUR NEXT TEST) test strip Use as instructed  . hydrochlorothiazide (HYDRODIURIL) 25 MG tablet Take 1 tablet (25 mg total) by mouth daily.  . Insulin Glargine (LANTUS SOLOSTAR) 100 UNIT/ML Solostar Pen Inject subcutaneously 50  units nightly  . insulin lispro (HUMALOG KWIKPEN) 100 UNIT/ML KiwkPen Inject subcutaneously 50  units per day  . lisinopril (PRINIVIL,ZESTRIL) 40 MG tablet Take 1 tablet (40 mg total) by mouth daily.  Marland Kitchen loratadine (CLARITIN) 10 MG tablet Take 10 mg by mouth daily.  . Multiple Vitamin (MULTIVITAMIN) capsule Take 1 capsule by mouth daily.  . sertraline (ZOLOFT) 50 MG tablet Take 1 tablet (50 mg total) by mouth daily.  . [DISCONTINUED] lisinopril (PRINIVIL,ZESTRIL) 40 MG  tablet Take 1 tablet (40 mg total) by mouth daily.  Marland Kitchen levocetirizine (XYZAL) 5 MG tablet Take 1 tablet (5 mg total) by mouth every evening.   No facility-administered encounter medications on file as of 07/31/2017.     Review of Systems  Constitutional: Negative for appetite change and unexpected weight change.  HENT: Positive for congestion and postnasal drip. Negative for sinus pressure.   Respiratory: Positive for cough. Negative for chest tightness and shortness of breath.   Cardiovascular: Negative for chest pain, palpitations and leg swelling.  Gastrointestinal: Negative for abdominal pain, diarrhea, nausea and vomiting.  Genitourinary: Negative for difficulty urinating and dysuria.  Musculoskeletal: Negative for joint swelling and myalgias.  Skin: Negative for color change and  rash.  Neurological: Negative for dizziness, light-headedness and headaches.  Psychiatric/Behavioral: Negative for agitation and dysphoric mood.       Objective:    Physical Exam  Constitutional: She appears well-developed and well-nourished. No distress.  HENT:  Nose: Nose normal.  Mouth/Throat: Oropharynx is clear and moist.  Neck: Neck supple. No thyromegaly present.  Cardiovascular: Normal rate and regular rhythm.   Pulmonary/Chest: Breath sounds normal. No respiratory distress. She has no wheezes.  Abdominal: Soft. Bowel sounds are normal. There is no tenderness.  Musculoskeletal: She exhibits no edema or tenderness.  Foot exam:  Intact to light touch and pin prick.  No lesions.  DP pulses palpable and equal bilaterally.    Lymphadenopathy:    She has no cervical adenopathy.  Skin: No rash noted. No erythema.  Psychiatric: She has a normal mood and affect. Her behavior is normal.    BP (!) 160/80 (BP Location: Left Arm, Patient Position: Sitting, Cuff Size: Normal)   Pulse 87   Temp 98.8 F (37.1 C) (Oral)   Resp 12   Ht '5\' 4"'  (1.626 m)   Wt 223 lb (101.2 kg)   SpO2 95%   BMI 38.28 kg/m  Wt Readings from Last 3 Encounters:  07/31/17 223 lb (101.2 kg)  05/15/17 225 lb (102.1 kg)  03/20/17 227 lb (103 kg)     Lab Results  Component Value Date   WBC 10.2 05/08/2017   HGB 14.3 05/08/2017   HCT 42.3 05/08/2017   PLT 252 05/08/2017   GLUCOSE 59 (L) 05/08/2017   CHOL 244 (H) 05/08/2017   TRIG 288 (H) 05/08/2017   HDL 41 05/08/2017   LDLCALC 145 (H) 05/08/2017   ALT 12 05/08/2017   AST 16 05/08/2017   NA 140 05/08/2017   K 4.1 05/08/2017   CL 102 05/08/2017   CREATININE 0.87 05/08/2017   BUN 7 05/08/2017   CO2 23 05/08/2017   TSH 1.760 01/09/2017   HGBA1C 7.3 (H) 05/08/2017    Mm Digital Screening Bilateral  Result Date: 10/31/2016 CLINICAL DATA:  Screening. EXAM: DIGITAL SCREENING BILATERAL MAMMOGRAM WITH CAD COMPARISON:  Previous exam(s). ACR Breast  Density Category a: The breast tissue is almost entirely fatty. FINDINGS: There are no findings suspicious for malignancy. Images were processed with CAD. IMPRESSION: No mammographic evidence of malignancy. A result letter of this screening mammogram will be mailed directly to the patient. RECOMMENDATION: Screening mammogram in one year. (Code:SM-B-01Y) BI-RADS CATEGORY  1: Negative. Electronically Signed   By: Margarette Canada M.D.   On: 10/31/2016 10:15       Assessment & Plan:   Problem List Items Addressed This Visit    Anxiety    On zoloft.  Stable.  Follow.  BMI 38.0-38.9,adult    Low carb diet and exercise.  Follow.        Essential hypertension    Blood pressure elevated.  Restart lisinopril 16m q day.  Follow pressures.  Follow metabolic panel.        Relevant Medications   lisinopril (PRINIVIL,ZESTRIL) 40 MG tablet   Other Relevant Orders   Basic metabolic panel   Hypercholesterolemia    On zetia and tolerating.  Low cholesterol diet and exercise.  Follow lipid panel and liver function tests.        Relevant Medications   lisinopril (PRINIVIL,ZESTRIL) 40 MG tablet   Other Relevant Orders   Hepatic function panel   Lipid panel   Nasal congestion    Increased allergies as outlined.  Start xyzal.  nasacort as directed. Notify me if persistent problems.        Tobacco use    Have discussed the need to quit.  She has declined to stop.        Type 1 diabetes mellitus ([facility])    Discussed diet and exercise.  Low carb diet.  Follow met b and a1c.  Recent a1c 7.3.  Up to date with eye checks.        Relevant Medications   lisinopril (PRINIVIL,ZESTRIL) 40 MG tablet   Other Relevant Orders   Hemoglobin A1c       SEinar Pheasant MD

## 2017-07-31 NOTE — Patient Instructions (Signed)
nasacort nasal spray - 2 sprays each nostril one time per day.  Do this in the evening.    Examples of probiotics: culturelle, align, [employer], or florastor

## 2017-08-02 ENCOUNTER — Encounter: Payer: Self-pay | Admitting: Internal Medicine

## 2017-08-02 NOTE — Assessment & Plan Note (Signed)
Increased allergies as outlined.  Start xyzal.  nasacort as directed. Notify me if persistent problems.

## 2017-08-02 NOTE — Assessment & Plan Note (Signed)
On zetia and tolerating.  Low cholesterol diet and exercise.  Follow lipid panel and liver function tests.

## 2017-08-02 NOTE — Assessment & Plan Note (Signed)
Have discussed the need to quit.  She has declined to stop.

## 2017-08-02 NOTE — Assessment & Plan Note (Signed)
Blood pressure elevated.  Restart lisinopril 40mg  q day.  Follow pressures.  Follow metabolic panel.

## 2017-08-02 NOTE — Assessment & Plan Note (Signed)
On zoloft.  Stable.  Follow.  

## 2017-08-02 NOTE — Assessment & Plan Note (Signed)
Discussed diet and exercise.  Low carb diet.  Follow met b and a1c.  Recent a1c 7.3.  Up to date with eye checks.

## 2017-08-02 NOTE — Assessment & Plan Note (Signed)
Low carb diet and exercise.  Follow.  

## 2017-08-28 LAB — HM DIABETES EYE EXAM

## 2017-10-02 ENCOUNTER — Ambulatory Visit: Payer: Managed Care, Other (non HMO) | Admitting: Internal Medicine

## 2017-10-05 ENCOUNTER — Other Ambulatory Visit: Payer: Self-pay | Admitting: Internal Medicine

## 2017-10-09 ENCOUNTER — Encounter: Payer: Self-pay | Admitting: Internal Medicine

## 2017-10-09 ENCOUNTER — Ambulatory Visit: Payer: Managed Care, Other (non HMO) | Admitting: Internal Medicine

## 2017-10-09 ENCOUNTER — Other Ambulatory Visit: Payer: Self-pay

## 2017-10-09 VITALS — BP 128/76 | HR 100 | Temp 98.4°F | Resp 14 | Ht 64.0 in | Wt 224.0 lb

## 2017-10-09 DIAGNOSIS — I10 Essential (primary) hypertension: Secondary | ICD-10-CM

## 2017-10-09 DIAGNOSIS — E109 Type 1 diabetes mellitus without complications: Secondary | ICD-10-CM

## 2017-10-09 DIAGNOSIS — Z6838 Body mass index (BMI) 38.0-38.9, adult: Secondary | ICD-10-CM

## 2017-10-09 DIAGNOSIS — Z1231 Encounter for screening mammogram for malignant neoplasm of breast: Secondary | ICD-10-CM | POA: Diagnosis not present

## 2017-10-09 DIAGNOSIS — Z720 Tobacco use: Secondary | ICD-10-CM

## 2017-10-09 DIAGNOSIS — Z1239 Encounter for other screening for malignant neoplasm of breast: Secondary | ICD-10-CM

## 2017-10-09 DIAGNOSIS — F419 Anxiety disorder, unspecified: Secondary | ICD-10-CM

## 2017-10-09 DIAGNOSIS — Z23 Encounter for immunization: Secondary | ICD-10-CM | POA: Diagnosis not present

## 2017-10-09 DIAGNOSIS — E7800 Pure hypercholesterolemia, unspecified: Secondary | ICD-10-CM

## 2017-10-09 MED ORDER — LEVOCETIRIZINE DIHYDROCHLORIDE 5 MG PO TABS: 5.0000 mg | ORAL_TABLET | Freq: Every evening | ORAL | 1 refills | Status: DC

## 2017-10-09 NOTE — Progress Notes (Signed)
Patient ID: Jill Crosby, female   DOB: 03/21/1961, 56 y.o.   MRN: [id number]   Subjective:    Patient ID: Jill Crosby, female    DOB: 1961/07/18, 56 y.o.   MRN: [id number]  HPI  Patient here for a scheduled follow up.  She reports she is doing relatively well.  Trying to watch her diet.  Feels sugars are better.  Discussed diet and exercise.  No chest pain.  No sob.  No acid reflux.  No abdominal pain.  Bowels moving.  Taking her medication regularly.     Past Medical History:  Diagnosis Date  . Anxiety   . Depression   . Elevated BP   . Hypercholesterolemia   . Increased BMI   . Menopause   . Type I diabetes mellitus ([place])    Past Surgical History:  Procedure Laterality Date  . CESAREAN SECTION  1997   Family History  Problem Relation Age of Onset  . Hypertension Mother   . Hyperlipidemia Mother   . Colon polyps Mother   . Cancer Father        lung  . Colon cancer Maternal Grandfather   . Diabetes Maternal Grandfather   . Breast cancer Maternal Grandmother   . Colon cancer Paternal Grandfather   . Breast cancer Maternal Aunt   . Heart disease Neg Hx   . Ovarian cancer Neg Hx    Social History   Socioeconomic History  . Marital status: Single    Spouse name: None  . Number of children: None  . Years of education: None  . Highest education level: None  Social Needs  . Financial resource strain: None  . Food insecurity - worry: None  . Food insecurity - inability: None  . Transportation needs - medical: None  . Transportation needs - non-medical: None  Occupational History  . None  Tobacco Use  . Smoking status: Former Smoker    Packs/day: 0.50    Years: 20.00    Pack years: 10.00    Types: Cigarettes  . Smokeless tobacco: Never Used  Substance and Sexual Activity  . Alcohol use: No    Alcohol/week: 0.0 oz  . Drug use: No  . Sexual activity: Not Currently    Birth control/protection: Post-menopausal  Other Topics Concern  . None  Social  History Narrative  . None    Outpatient Encounter Medications as of 10/09/2017  Medication Sig  . BAYER MICROLET LANCETS lancets USE AS DIRECTED TO CHECK  SUGARS 1 TO 2 TIMES A DAY  . ezetimibe (ZETIA) 10 MG tablet TAKE 1 TABLET BY MOUTH  DAILY  . glucose blood (BAYER CONTOUR NEXT TEST) test strip Use as instructed  . hydrochlorothiazide (HYDRODIURIL) 25 MG tablet TAKE 1 TABLET BY MOUTH  DAILY  . Insulin Glargine (LANTUS SOLOSTAR) 100 UNIT/ML Solostar Pen Inject subcutaneously 50  units nightly  . insulin lispro (HUMALOG KWIKPEN) 100 UNIT/ML KiwkPen Inject subcutaneously 50  units per day  . levocetirizine (XYZAL) 5 MG tablet Take 1 tablet (5 mg total) every evening by mouth.  Marland Kitchen lisinopril (PRINIVIL,ZESTRIL) 40 MG tablet Take 1 tablet (40 mg total) by mouth daily.  Marland Kitchen loratadine (CLARITIN) 10 MG tablet Take 10 mg by mouth daily.  . Multiple Vitamin (MULTIVITAMIN) capsule Take 1 capsule by mouth daily.  . sertraline (ZOLOFT) 50 MG tablet TAKE 1 TABLET BY MOUTH  DAILY  . [DISCONTINUED] levocetirizine (XYZAL) 5 MG tablet Take 1 tablet (5 mg total) by mouth every evening.  No facility-administered encounter medications on file as of 10/09/2017.     Review of Systems  Constitutional: Negative for appetite change and unexpected weight change.  HENT: Negative for congestion and sinus pressure.   Respiratory: Negative for cough, chest tightness and shortness of breath.   Cardiovascular: Negative for chest pain, palpitations and leg swelling.  Gastrointestinal: Negative for abdominal pain, diarrhea, nausea and vomiting.  Genitourinary: Negative for difficulty urinating and dysuria.  Musculoskeletal: Negative for joint swelling and myalgias.  Skin: Negative for color change and rash.  Neurological: Negative for dizziness, light-headedness and headaches.  Psychiatric/Behavioral: Negative for agitation and dysphoric mood.       Objective:     Blood pressure rechecked by me:   128/76  Physical Exam  Constitutional: She appears well-developed and well-nourished. No distress.  HENT:  Nose: Nose normal.  Mouth/Throat: Oropharynx is clear and moist.  Neck: Neck supple. No thyromegaly present.  Cardiovascular: Normal rate and regular rhythm.  Pulmonary/Chest: Breath sounds normal. No respiratory distress. She has no wheezes.  Abdominal: Soft. Bowel sounds are normal. There is no tenderness.  Musculoskeletal: She exhibits no edema or tenderness.  Lymphadenopathy:    She has no cervical adenopathy.  Skin: No rash noted. No erythema.  Psychiatric: She has a normal mood and affect. Her behavior is normal.    BP 128/76   Pulse 100   Temp 98.4 F (36.9 C) (Oral)   Resp 14   Ht '5\' 4"'  (1.626 m)   Wt 224 lb (101.6 kg)   SpO2 95%   BMI 38.45 kg/m  Wt Readings from Last 3 Encounters:  10/09/17 224 lb (101.6 kg)  07/31/17 223 lb (101.2 kg)  05/15/17 225 lb (102.1 kg)     Lab Results  Component Value Date   WBC 10.2 05/08/2017   HGB 14.3 05/08/2017   HCT 42.3 05/08/2017   PLT 252 05/08/2017   GLUCOSE 59 (L) 05/08/2017   CHOL 244 (H) 05/08/2017   TRIG 288 (H) 05/08/2017   HDL 41 05/08/2017   LDLCALC 145 (H) 05/08/2017   ALT 12 05/08/2017   AST 16 05/08/2017   NA 140 05/08/2017   K 4.1 05/08/2017   CL 102 05/08/2017   CREATININE 0.87 05/08/2017   BUN 7 05/08/2017   CO2 23 05/08/2017   TSH 1.760 01/09/2017   HGBA1C 7.3 (H) 05/08/2017    Mm Digital Screening Bilateral  Result Date: 10/31/2016 CLINICAL DATA:  Screening. EXAM: DIGITAL SCREENING BILATERAL MAMMOGRAM WITH CAD COMPARISON:  Previous exam(s). ACR Breast Density Category a: The breast tissue is almost entirely fatty. FINDINGS: There are no findings suspicious for malignancy. Images were processed with CAD. IMPRESSION: No mammographic evidence of malignancy. A result letter of this screening mammogram will be mailed directly to the patient. RECOMMENDATION: Screening mammogram in one year.  (Code:SM-B-01Y) BI-RADS CATEGORY  1: Negative. Electronically Signed   By: Margarette Canada M.D.   On: 10/31/2016 10:15       Assessment & Plan:   Problem List Items Addressed This Visit    Anxiety    On zoloft.  Stable.       BMI 38.0-38.9,adult    Discussed diet and exercise.  Follow.        Essential hypertension    Blood pressure on recheck improved.  Follow pressures.  Follow metabolic panel.        Hypercholesterolemia    Low cholesterol diet and exercise.  Follow lipid panel.        Tobacco  use    Have discussed the need to quit.        Type 1 diabetes mellitus ([facility])    Discussed diet and exercise.  Low carb diet and exercise.  Follow met b and a1c.  Brought in no sugar readings.  Follow met b and a1c.         Other Visit Diagnoses    Breast cancer screening    -  Primary   Relevant Orders   MM Digital Screening   Need for immunization against influenza       Relevant Orders   Flu Vaccine QUAD 36+ mos IM (Completed)       Einar Pheasant, MD

## 2017-10-12 ENCOUNTER — Encounter: Payer: Self-pay | Admitting: Internal Medicine

## 2017-10-12 NOTE — Assessment & Plan Note (Signed)
Low cholesterol diet and exercise.  Follow lipid panel.   

## 2017-10-12 NOTE — Assessment & Plan Note (Signed)
Have discussed the need to quit.   

## 2017-10-12 NOTE — Assessment & Plan Note (Signed)
Blood pressure on recheck improved.  Follow pressures.  Follow metabolic panel.  

## 2017-10-12 NOTE — Assessment & Plan Note (Signed)
Discussed diet and exercise.  Follow.  

## 2017-10-12 NOTE — Assessment & Plan Note (Signed)
Discussed diet and exercise.  Low carb diet and exercise.  Follow met b and a1c.  Brought in no sugar readings.  Follow met b and a1c.

## 2017-10-12 NOTE — Assessment & Plan Note (Signed)
On zoloft.  Stable.  

## 2017-10-14 ENCOUNTER — Encounter: Payer: Self-pay | Admitting: Internal Medicine

## 2017-10-15 ENCOUNTER — Encounter: Payer: Managed Care, Other (non HMO) | Admitting: Obstetrics and Gynecology

## 2017-11-19 ENCOUNTER — Ambulatory Visit
Admission: RE | Admit: 2017-11-19 | Discharge: 2017-11-19 | Disposition: A | Discharge status: Home / Self Care | Payer: Managed Care, Other (non HMO) | Source: Ambulatory Visit | Attending: Internal Medicine | Admitting: Internal Medicine

## 2017-11-19 DIAGNOSIS — Z1231 Encounter for screening mammogram for malignant neoplasm of breast: Secondary | ICD-10-CM | POA: Diagnosis not present

## 2017-11-19 DIAGNOSIS — R928 Other abnormal and inconclusive findings on diagnostic imaging of breast: Secondary | ICD-10-CM | POA: Insufficient documentation

## 2017-11-19 DIAGNOSIS — Z1239 Encounter for other screening for malignant neoplasm of breast: Secondary | ICD-10-CM

## 2017-11-23 ENCOUNTER — Other Ambulatory Visit: Payer: Self-pay | Admitting: Internal Medicine

## 2017-11-23 DIAGNOSIS — R928 Other abnormal and inconclusive findings on diagnostic imaging of breast: Secondary | ICD-10-CM

## 2017-11-23 NOTE — Progress Notes (Signed)
Order placed for f/u left breast mammogram and ultrasound.   

## 2017-11-28 ENCOUNTER — Other Ambulatory Visit: Payer: Self-pay | Admitting: Internal Medicine

## 2017-12-03 ENCOUNTER — Ambulatory Visit
Admission: RE | Admit: 2017-12-03 | Discharge: 2017-12-03 | Disposition: A | Discharge status: Home / Self Care | Payer: Managed Care, Other (non HMO) | Source: Ambulatory Visit | Attending: Internal Medicine | Admitting: Internal Medicine

## 2017-12-03 DIAGNOSIS — R928 Other abnormal and inconclusive findings on diagnostic imaging of breast: Secondary | ICD-10-CM

## 2017-12-04 ENCOUNTER — Other Ambulatory Visit: Payer: Self-pay | Admitting: Internal Medicine

## 2017-12-04 DIAGNOSIS — R928 Other abnormal and inconclusive findings on diagnostic imaging of breast: Secondary | ICD-10-CM

## 2017-12-04 NOTE — Progress Notes (Signed)
Orders placed for f/u left breast mammogram and ultrasound 

## 2017-12-07 ENCOUNTER — Other Ambulatory Visit: Payer: Self-pay | Admitting: Internal Medicine

## 2017-12-07 DIAGNOSIS — R928 Other abnormal and inconclusive findings on diagnostic imaging of breast: Secondary | ICD-10-CM

## 2017-12-07 NOTE — Progress Notes (Signed)
Order placed for f/u left breast mammogram - 6 month f/u.

## 2017-12-11 ENCOUNTER — Telehealth: Payer: Self-pay | Admitting: Internal Medicine

## 2017-12-11 NOTE — Telephone Encounter (Signed)
Patient is aware of her US results- she has not been contacted to schedule the follow up yet- but if she is not scheduled by her appointment in February- she will have Dr Lorin PicketScott reach out.

## 2017-12-11 NOTE — Telephone Encounter (Signed)
FYI patient stated if she ws not scheduled by next appointment she would have us contact [place].

## 2017-12-14 ENCOUNTER — Encounter: Payer: Self-pay | Admitting: *Deleted

## 2017-12-31 ENCOUNTER — Encounter: Payer: Self-pay | Admitting: Internal Medicine

## 2018-01-02 ENCOUNTER — Other Ambulatory Visit: Payer: Self-pay | Admitting: Internal Medicine

## 2018-01-09 LAB — LIPID PANEL
CHOL/HDL RATIO: 5.8 ratio — AB (ref 0.0–4.4)
CHOLESTEROL TOTAL: 233 mg/dL — AB (ref 100–199)
HDL: 40 mg/dL (ref >39)
LDL Calculated: 134 mg/dL — ABNORMAL HIGH (ref 0–99)
TRIGLYCERIDES: 294 mg/dL — AB (ref 0–149)
VLDL Cholesterol Cal: 59 mg/dL — ABNORMAL HIGH (ref 5–40)

## 2018-01-09 LAB — HEPATIC FUNCTION PANEL
ALBUMIN: 4.1 g/dL (ref 3.5–5.5)
ALT: 11 IU/L (ref 0–32)
AST: 11 IU/L (ref 0–40)
Alkaline Phosphatase: 55 IU/L (ref 39–117)
Bilirubin Total: 0.3 mg/dL (ref 0.0–1.2)
Bilirubin, Direct: 0.1 mg/dL (ref 0.00–0.40)
TOTAL PROTEIN: 6.6 g/dL (ref 6.0–8.5)

## 2018-01-09 LAB — BASIC METABOLIC PANEL
BUN / CREAT RATIO: 12 (ref 9–23)
BUN: 11 mg/dL (ref 6–24)
CHLORIDE: 100 mmol/L (ref 96–106)
CO2: 22 mmol/L (ref 20–29)
Calcium: 9.4 mg/dL (ref 8.7–10.2)
Creatinine, Ser: 0.95 mg/dL (ref 0.57–1.00)
GFR calc Af Amer: 76 mL/min/{1.73_m2} (ref >59)
GFR, EST NON AFRICAN AMERICAN: 66 mL/min/{1.73_m2} (ref >59)
Glucose: 156 mg/dL — ABNORMAL HIGH (ref 65–99)
Potassium: 4 mmol/L (ref 3.5–5.2)
Sodium: 140 mmol/L (ref 134–144)

## 2018-01-09 LAB — HEMOGLOBIN A1C
Est. average glucose Bld gHb Est-mCnc: 171 mg/dL
Hgb A1c MFr Bld: 7.6 % — ABNORMAL HIGH (ref 4.8–5.6)

## 2018-01-15 ENCOUNTER — Encounter: Payer: Self-pay | Admitting: Internal Medicine

## 2018-01-15 ENCOUNTER — Ambulatory Visit (INDEPENDENT_AMBULATORY_CARE_PROVIDER_SITE_OTHER): Payer: Managed Care, Other (non HMO) | Admitting: Internal Medicine

## 2018-01-15 VITALS — BP 138/74 | HR 81 | Temp 99.7°F | Resp 16 | Wt 222.2 lb

## 2018-01-15 DIAGNOSIS — Z6838 Body mass index (BMI) 38.0-38.9, adult: Secondary | ICD-10-CM

## 2018-01-15 DIAGNOSIS — Z0000 Encounter for general adult medical examination without abnormal findings: Secondary | ICD-10-CM | POA: Diagnosis not present

## 2018-01-15 DIAGNOSIS — I10 Essential (primary) hypertension: Secondary | ICD-10-CM | POA: Diagnosis not present

## 2018-01-15 DIAGNOSIS — E7800 Pure hypercholesterolemia, unspecified: Secondary | ICD-10-CM

## 2018-01-15 DIAGNOSIS — Z720 Tobacco use: Secondary | ICD-10-CM

## 2018-01-15 DIAGNOSIS — E109 Type 1 diabetes mellitus without complications: Secondary | ICD-10-CM

## 2018-01-15 MED ORDER — BAYER MICROLET LANCETS MISC: 2 refills | Status: DC

## 2018-01-15 MED ORDER — GLUCOSE BLOOD VI STRP: ORAL_STRIP | 12 refills | Status: DC

## 2018-01-15 MED ORDER — BAYER MICROLET LANCETS MISC: 2 refills | Status: AC

## 2018-01-15 MED ORDER — LEVOFLOXACIN 0.5 % OP SOLN: 1.0000 [drp] | Freq: Four times a day (QID) | OPHTHALMIC | 0 refills | Status: DC

## 2018-01-15 NOTE — Progress Notes (Signed)
Patient ID: Jill Crosby, female   DOB: [birth year], 59 y.o.   MRN: [id number]   Subjective:    Patient ID: Jill Crosby, female    DOB: July 11, 1961, 57 y.o.   MRN: [id number]  HPI  Patient here for her physical exam.  She reports she is doing relatively well.  Discussed her recent labs.  a1c increased.  Not watching her diet.  Not exercising. Plans to get more serious with her diet and exercise.  She did not bring in any sugar readings.  States am sugars averaging 100-130.  PM sugars can vary.  Can be over 200.  On questioning her, she reports she will eat and not give herself insulin - at times.  Varies as to how she is giving her sugars.  She does check her sugars regularly throughout the day.  She will also (at times) check her sugar and give herself insulin and not eat.  Discussed importance of eating regular meals and given insulin at regular times with her meals.  No chest pain.  No sob. No acid reflux.  No abdominal pain.  Bowels moving.  No urine change. Discussed cholesterol results.     Past Medical History:  Diagnosis Date  . Anxiety   . Depression   . Elevated BP   . Hypercholesterolemia   . Increased BMI   . Menopause   . Type I diabetes mellitus (HCC)    Past Surgical History:  Procedure Laterality Date  . CESAREAN SECTION  1997   Family History  Problem Relation Age of Onset  . Hypertension Mother   . Hyperlipidemia Mother   . Colon polyps Mother   . Cancer Father        lung  . Colon cancer Maternal Grandfather   . Diabetes Maternal Grandfather   . Breast cancer Maternal Grandmother   . Colon cancer Paternal Grandfather   . Breast cancer Maternal Aunt   . Heart disease Neg Hx   . Ovarian cancer Neg Hx    Social History   Socioeconomic History  . Marital status: Single    Spouse name: None  . Number of children: None  . Years of education: None  . Highest education level: None  Social Needs  . Financial resource strain: None  . Food insecurity -  worry: None  . Food insecurity - inability: None  . Transportation needs - medical: None  . Transportation needs - non-medical: None  Occupational History  . None  Tobacco Use  . Smoking status: Former Smoker    Packs/day: 0.50    Years: 20.00    Pack years: 10.00    Types: Cigarettes  . Smokeless tobacco: Never Used  Substance and Sexual Activity  . Alcohol use: No    Alcohol/week: 0.0 oz  . Drug use: No  . Sexual activity: Not Currently    Birth control/protection: Post-menopausal  Other Topics Concern  . None  Social History Narrative  . None    Outpatient Encounter Medications as of 01/15/2018  Medication Sig  . BAYER MICROLET LANCETS lancets USE AS DIRECTED TO CHECK  SUGARS 1 TO 2 TIMES A DAY  . ezetimibe (ZETIA) 10 MG tablet TAKE 1 TABLET BY MOUTH  DAILY  . glucose blood (ONETOUCH VERIO) test strip CHECK BLOOD SUGAR 3 TIMES  DAILY  . hydrochlorothiazide (HYDRODIURIL) 25 MG tablet TAKE 1 TABLET BY MOUTH  DAILY  . Insulin Glargine (LANTUS SOLOSTAR) 100 UNIT/ML Solostar Pen Inject subcutaneously 50  units nightly  .  insulin lispro (HUMALOG KWIKPEN) 100 UNIT/ML KiwkPen Inject subcutaneously 50  units per day  . levocetirizine (XYZAL) 5 MG tablet Take 1 tablet (5 mg total) every evening by mouth.  . levofloxacin (QUIXIN) 0.5 % ophthalmic solution Place 1 drop into both eyes every 6 (six) hours.  Marland Kitchen lisinopril (PRINIVIL,ZESTRIL) 40 MG tablet Take 1 tablet (40 mg total) by mouth daily.  Marland Kitchen loratadine (CLARITIN) 10 MG tablet Take 10 mg by mouth daily.  . Multiple Vitamin (MULTIVITAMIN) capsule Take 1 capsule by mouth daily.  . sertraline (ZOLOFT) 50 MG tablet TAKE 1 TABLET BY MOUTH  DAILY  . [DISCONTINUED] BAYER MICROLET LANCETS lancets USE AS DIRECTED TO CHECK  SUGARS 1 TO 2 TIMES A DAY  . [DISCONTINUED] BAYER MICROLET LANCETS lancets USE AS DIRECTED TO CHECK  SUGARS 1 TO 2 TIMES A DAY  . [DISCONTINUED] glucose blood (ONETOUCH VERIO) test strip CHECK BLOOD SUGAR 3 TIMES  DAILY    . [DISCONTINUED] ONETOUCH VERIO test strip CHECK BLOOD SUGAR 3 TIMES  DAILY   No facility-administered encounter medications on file as of 01/15/2018.     Review of Systems  Constitutional: Negative for appetite change and unexpected weight change.  HENT: Negative for congestion and sinus pressure.   Eyes: Negative for pain and visual disturbance.  Respiratory: Negative for cough, chest tightness and shortness of breath.   Cardiovascular: Negative for chest pain, palpitations and leg swelling.  Gastrointestinal: Negative for abdominal pain, diarrhea, nausea and vomiting.  Genitourinary: Negative for difficulty urinating and dysuria.  Musculoskeletal: Negative for joint swelling and myalgias.  Skin: Negative for color change and rash.  Neurological: Negative for dizziness, light-headedness and headaches.  Hematological: Negative for adenopathy. Does not bruise/bleed easily.  Psychiatric/Behavioral: Negative for agitation and dysphoric mood.       Objective:    Physical Exam  Constitutional: She is oriented to person, place, and time. She appears well-developed and well-nourished. No distress.  HENT:  Nose: Nose normal.  Mouth/Throat: Oropharynx is clear and moist.  Eyes: Right eye exhibits no discharge. Left eye exhibits no discharge. No scleral icterus.  Neck: Neck supple. No thyromegaly present.  Cardiovascular: Normal rate and regular rhythm.  Pulmonary/Chest: Breath sounds normal. No accessory muscle usage. No tachypnea. No respiratory distress. She has no decreased breath sounds. She has no wheezes. She has no rhonchi. Right breast exhibits no inverted nipple, no mass, no nipple discharge and no tenderness (no axillary adenopathy). Left breast exhibits no inverted nipple, no mass, no nipple discharge and no tenderness (no axilarry adenopathy).  Abdominal: Soft. Bowel sounds are normal. There is no tenderness.  Musculoskeletal: She exhibits no edema or tenderness.   Lymphadenopathy:    She has no cervical adenopathy.  Neurological: She is alert and oriented to person, place, and time.  Skin: Skin is warm. No rash noted. No erythema.  Psychiatric: She has a normal mood and affect. Her behavior is normal.    BP 138/74   Pulse 81   Temp 99.7 F (37.6 C) (Oral)   Resp 16   Wt 222 lb 3.2 oz (100.8 kg)   SpO2 96%   BMI 38.14 kg/m  Wt Readings from Last 3 Encounters:  01/15/18 222 lb 3.2 oz (100.8 kg)  10/09/17 224 lb (101.6 kg)  07/31/17 223 lb (101.2 kg)     Lab Results  Component Value Date   WBC 10.2 05/08/2017   HGB 14.3 05/08/2017   HCT 42.3 05/08/2017   PLT 252 05/08/2017   GLUCOSE 156 (  H) 01/08/2018   CHOL 233 (H) 01/08/2018   TRIG 294 (H) 01/08/2018   HDL 40 01/08/2018   LDLCALC 134 (H) 01/08/2018   ALT 11 01/08/2018   AST 11 01/08/2018   NA 140 01/08/2018   K 4.0 01/08/2018   CL 100 01/08/2018   CREATININE 0.95 01/08/2018   BUN 11 01/08/2018   CO2 22 01/08/2018   TSH 1.760 01/09/2017   HGBA1C 7.6 (H) 01/08/2018    [place] Breast Ltd Uni Left Inc Axilla  Result Date: 12/03/2017 CLINICAL DATA:  57 year old female recalled from screening mammogram dated 11/20/2017 for a left breast mass. EXAM: 2D DIGITAL DIAGNOSTIC LEFT MAMMOGRAM WITH CAD AND ADJUNCT TOMO ULTRASOUND LEFT BREAST COMPARISON:  Previous exam(s). ACR Breast Density Category b: There are scattered areas of fibroglandular density. FINDINGS: A round, circumscribed mass is again demonstrated in the superior central left breast at posterior depth. It has gently lobulated borders and suggestion of a fatty hilum. Further evaluation with ultrasound was performed. Mammographic images were processed with CAD. Extensive sonographic evaluation of the entire superior left breast was performed. No focal sonographic findings are identified to correspond with the mammographic mass. IMPRESSION: Probably benign left breast mass without ultrasound correlate. Findings are felt to most likely  represent a small intramammary lymph node. Recommendation is for six-month follow-up to demonstrate stability. RECOMMENDATION: Diagnostic left breast mammogram with possible ultrasound if indicated in 6 months. I have discussed the findings and recommendations with the patient. Results were also provided in writing at the conclusion of the visit. If applicable, a reminder letter will be sent to the patient regarding the next appointment. BI-RADS CATEGORY  3: Probably benign. Electronically Signed   By: Sande BrothersSerena  Chacko M.D.   On: 12/03/2017 16:37   Mm Diag Breast Tomo Uni Left  Result Date: 12/03/2017 CLINICAL DATA:  57 year old female recalled from screening mammogram dated 11/20/2017 for a left breast mass. EXAM: 2D DIGITAL DIAGNOSTIC LEFT MAMMOGRAM WITH CAD AND ADJUNCT TOMO ULTRASOUND LEFT BREAST COMPARISON:  Previous exam(s). ACR Breast Density Category b: There are scattered areas of fibroglandular density. FINDINGS: A round, circumscribed mass is again demonstrated in the superior central left breast at posterior depth. It has gently lobulated borders and suggestion of a fatty hilum. Further evaluation with ultrasound was performed. Mammographic images were processed with CAD. Extensive sonographic evaluation of the entire superior left breast was performed. No focal sonographic findings are identified to correspond with the mammographic mass. IMPRESSION: Probably benign left breast mass without ultrasound correlate. Findings are felt to most likely represent a small intramammary lymph node. Recommendation is for six-month follow-up to demonstrate stability. RECOMMENDATION: Diagnostic left breast mammogram with possible ultrasound if indicated in 6 months. I have discussed the findings and recommendations with the patient. Results were also provided in writing at the conclusion of the visit. If applicable, a reminder letter will be sent to the patient regarding the next appointment. BI-RADS CATEGORY  3:  Probably benign. Electronically Signed   By: Sande BrothersSerena  Chacko M.D.   On: 12/03/2017 16:37       Assessment & Plan:   Problem List Items Addressed This Visit    BMI 38.0-38.9,adult    Discussed diet and exercise.  Follow.        Essential hypertension    Blood pressure under reasonable control.  Slight elevation on recheck.  Continue same medication regimen.  Follow pressures.  Follow metabolic panel.        Health care maintenance    Physical today  01/15/18.  Mammogram 10/31/16 - birads I.  Schedule f/u mammogram scheduled.  Colonoscopy 2014.        Hypercholesterolemia    Low cholesterol diet and exercise.  On zetia.  Discussed other treatment options.  Discussed trying another statin.  Discussed repatha.  She declines any change in medication.        Tobacco use    Have discussed the need to quit.        Type 1 diabetes mellitus (HCC)    Discussed diet and exercise.  Discussed the importance of not giving herself insulin without eating.  Have her check and record her sugars. Send in readings over the next few weeks.  Adjustments to be made as necessary.         Other Visit Diagnoses    Routine general medical examination at a health care facility    -  Primary       Dale Hubbard, MD

## 2018-01-17 ENCOUNTER — Encounter: Payer: Self-pay | Admitting: Internal Medicine

## 2018-01-17 NOTE — Assessment & Plan Note (Signed)
Discussed diet and exercise.  Follow.  

## 2018-01-17 NOTE — Assessment & Plan Note (Signed)
Blood pressure under reasonable control.  Slight elevation on recheck.  Continue same medication regimen.  Follow pressures.  Follow metabolic panel.

## 2018-01-17 NOTE — Assessment & Plan Note (Signed)
Physical today 01/15/18.  Mammogram 10/31/16 - birads I.  Schedule f/u mammogram scheduled.  Colonoscopy 2014.

## 2018-01-17 NOTE — Assessment & Plan Note (Signed)
Discussed diet and exercise.  Discussed the importance of not giving herself insulin without eating.  Have her check and record her sugars. Send in readings over the next few weeks.  Adjustments to be made as necessary.

## 2018-01-17 NOTE — Assessment & Plan Note (Signed)
Have discussed the need to quit.   

## 2018-01-17 NOTE — Assessment & Plan Note (Signed)
Low cholesterol diet and exercise.  On zetia.  Discussed other treatment options.  Discussed trying another statin.  Discussed repatha.  She declines any change in medication.

## 2018-01-26 ENCOUNTER — Other Ambulatory Visit: Payer: Self-pay | Admitting: Internal Medicine

## 2018-02-14 ENCOUNTER — Other Ambulatory Visit: Payer: Self-pay | Admitting: Internal Medicine

## 2018-03-13 ENCOUNTER — Other Ambulatory Visit: Payer: Self-pay | Admitting: Internal Medicine

## 2018-04-05 ENCOUNTER — Other Ambulatory Visit: Payer: Self-pay | Admitting: Internal Medicine

## 2018-04-05 MED ORDER — LEVOCETIRIZINE DIHYDROCHLORIDE 5 MG PO TABS: 5.0000 mg | ORAL_TABLET | Freq: Every evening | ORAL | 0 refills | Status: DC

## 2018-04-05 NOTE — Telephone Encounter (Signed)
Copied from CRM [id number]. Topic: Quick Communication - Rx Refill/Question >> Apr 05, 2018  9:34 AM Gerrianne Scale wrote: Medication: levocetirizine (XYZAL) 5 MG tablet Has the patient contacted their pharmacy? Yes.   (Agent: If no, request that the patient contact the pharmacy for the refill.) Preferred Pharmacy (with phone number or street name): [facility] - [place], [place] - [address] [phone] (Phone) [phone] (Fax)  Agent: Please be advised that RX refills may take up to 3 business days. We ask that you follow-up with your pharmacy.

## 2018-04-09 ENCOUNTER — Ambulatory Visit: Payer: Managed Care, Other (non HMO) | Admitting: Internal Medicine

## 2018-05-08 ENCOUNTER — Other Ambulatory Visit: Payer: Self-pay | Admitting: Internal Medicine

## 2018-06-04 ENCOUNTER — Ambulatory Visit
Admission: RE | Admit: 2018-06-04 | Discharge: 2018-06-04 | Disposition: A | Discharge status: Home / Self Care | Payer: Managed Care, Other (non HMO) | Source: Ambulatory Visit | Attending: Internal Medicine | Admitting: Internal Medicine

## 2018-06-04 DIAGNOSIS — R928 Other abnormal and inconclusive findings on diagnostic imaging of breast: Secondary | ICD-10-CM | POA: Diagnosis not present

## 2018-06-07 ENCOUNTER — Other Ambulatory Visit: Payer: Self-pay | Admitting: Internal Medicine

## 2018-06-07 DIAGNOSIS — R928 Other abnormal and inconclusive findings on diagnostic imaging of breast: Secondary | ICD-10-CM

## 2018-06-07 NOTE — Progress Notes (Signed)
Order placed for f/u diagnostic mammogram.   

## 2018-07-09 ENCOUNTER — Telehealth: Payer: Self-pay | Admitting: *Deleted

## 2018-07-09 ENCOUNTER — Other Ambulatory Visit: Payer: Self-pay

## 2018-07-09 ENCOUNTER — Telehealth: Payer: Self-pay | Admitting: Internal Medicine

## 2018-07-09 MED ORDER — LEVOCETIRIZINE DIHYDROCHLORIDE 5 MG PO TABS: 5.0000 mg | ORAL_TABLET | Freq: Every evening | ORAL | 0 refills | Status: DC

## 2018-07-09 NOTE — Telephone Encounter (Signed)
Copied from CRM [id number]. Topic: Inquiry >> Jul 09, 2018  3:51 PM Yvonna Alanisobinson, Andra M wrote: Reason for CRM: Patient called requesting a 90 Supply Refill of Levocetirizine (XYZAL) 5 MG tablet. [facility] [place] - [id number] [phone] (Phone) [phone] (Fax). Patient also wants to know if she can have refills on this medication automatically.      Thank You!!!

## 2018-07-09 NOTE — Telephone Encounter (Signed)
rx sent in. Pt aware 

## 2018-07-09 NOTE — Telephone Encounter (Signed)
Copied from CRM [phone]. Topic: Inquiry >> Jul 09, 2018  3:51 PM Yvonna Alanisobinson, Andra M wrote: Reason for CRM: Patient called requesting a 90 Supply Refill of Levocetirizine (XYZAL) 5 MG tablet. [facility] [place] - [id number] [phone] (Phone) [phone] (Fax).       Thank You!!!

## 2018-07-10 ENCOUNTER — Other Ambulatory Visit: Payer: Self-pay | Admitting: Internal Medicine

## 2018-08-02 ENCOUNTER — Other Ambulatory Visit: Payer: Self-pay | Admitting: Internal Medicine

## 2018-08-03 ENCOUNTER — Ambulatory Visit: Payer: Managed Care, Other (non HMO) | Admitting: Internal Medicine

## 2018-08-03 ENCOUNTER — Other Ambulatory Visit: Payer: Self-pay | Admitting: Internal Medicine

## 2018-08-03 VITALS — BP 140/74 | HR 84 | Temp 98.3°F | Resp 18 | Wt 225.0 lb

## 2018-08-03 DIAGNOSIS — F419 Anxiety disorder, unspecified: Secondary | ICD-10-CM

## 2018-08-03 DIAGNOSIS — Z720 Tobacco use: Secondary | ICD-10-CM

## 2018-08-03 DIAGNOSIS — Z23 Encounter for immunization: Secondary | ICD-10-CM

## 2018-08-03 DIAGNOSIS — E109 Type 1 diabetes mellitus without complications: Secondary | ICD-10-CM

## 2018-08-03 DIAGNOSIS — I10 Essential (primary) hypertension: Secondary | ICD-10-CM

## 2018-08-03 DIAGNOSIS — J3489 Other specified disorders of nose and nasal sinuses: Secondary | ICD-10-CM

## 2018-08-03 DIAGNOSIS — E7800 Pure hypercholesterolemia, unspecified: Secondary | ICD-10-CM | POA: Diagnosis not present

## 2018-08-03 DIAGNOSIS — E538 Deficiency of other specified B group vitamins: Secondary | ICD-10-CM

## 2018-08-03 DIAGNOSIS — Z6838 Body mass index (BMI) 38.0-38.9, adult: Secondary | ICD-10-CM | POA: Diagnosis not present

## 2018-08-03 LAB — HM DIABETES FOOT EXAM

## 2018-08-03 MED ORDER — AMLODIPINE BESYLATE 2.5 MG PO TABS: 2.5000 mg | ORAL_TABLET | Freq: Every day | ORAL | 2 refills | Status: DC

## 2018-08-03 MED ORDER — MUPIROCIN CALCIUM 2 % EX CREA: 1.0000 "application " | TOPICAL_CREAM | Freq: Two times a day (BID) | CUTANEOUS | 0 refills | Status: DC

## 2018-08-03 MED ORDER — GLUCOSE BLOOD VI STRP: ORAL_STRIP | 6 refills | Status: DC

## 2018-08-03 NOTE — Progress Notes (Signed)
Patient ID: Jill Crosby, female   DOB: 1961/05/14, 57 y.o.   MRN: [id number]   Subjective:    Patient ID: Jill Crosby, female    DOB: 1961-11-09, 57 y.o.   MRN: [id number]  HPI  Patient here for a scheduled follow up.  She reports she is doing relatively well.  Taking lantus 55 units at 10pm.  Also has adjusted her humalog.  Takes 5 units per 15 grams of carbs.  States this has helped her sugars.  Did not bring in sugar readings.  Has adjusted her diet.  Eating more fresh vegetables. Eating breakfast. Also has started eating more lean protein.  States sugars in am averaging 90-130. Lunch time sugars 150-160 and pm sugars around 130.  She is walking.  Some knee pain. Remote injury - 20-25 years ago.  Takes ibuprofen prn. Is still smoking.  Discussed the need to quit.  States smokes 10 packs q 14 days.  She plans to set a quit date.  Plans to quit 10/21/18.  Persistent nasal lesion.  Discussed form completion - BMI appeal.    Past Medical History:  Diagnosis Date  . Anxiety   . Depression   . Elevated BP   . Hypercholesterolemia   . Increased BMI   . Menopause   . Type I diabetes mellitus ([place])    Past Surgical History:  Procedure Laterality Date  . CESAREAN SECTION  1997   Family History  Problem Relation Age of Onset  . Hypertension Mother   . Hyperlipidemia Mother   . Colon polyps Mother   . Cancer Father        lung  . Colon cancer Maternal Grandfather   . Diabetes Maternal Grandfather   . Breast cancer Maternal Grandmother   . Colon cancer Paternal Grandfather   . Breast cancer Maternal Aunt   . Heart disease Neg Hx   . Ovarian cancer Neg Hx    Social History   Socioeconomic History  . Marital status: Single    Spouse name: Not on file  . Number of children: Not on file  . Years of education: Not on file  . Highest education level: Not on file  Occupational History  . Not on file  Social Needs  . Financial resource strain: Not on file  . Food insecurity:      Worry: Not on file    Inability: Not on file  . Transportation needs:    Medical: Not on file    Non-medical: Not on file  Tobacco Use  . Smoking status: Former Smoker    Packs/day: 0.50    Years: 20.00    Pack years: 10.00    Types: Cigarettes  . Smokeless tobacco: Never Used  Substance and Sexual Activity  . Alcohol use: No    Alcohol/week: 0.0 standard drinks  . Drug use: No  . Sexual activity: Not Currently    Birth control/protection: Post-menopausal  Lifestyle  . Physical activity:    Days per week: Not on file    Minutes per session: Not on file  . Stress: Not on file  Relationships  . Social connections:    Talks on phone: Not on file    Gets together: Not on file    Attends religious service: Not on file    Active member of club or organization: Not on file    Attends meetings of clubs or organizations: Not on file    Relationship status: Not on file  Other Topics  Concern  . Not on file  Social History Narrative  . Not on file    Outpatient Encounter Medications as of 08/03/2018  Medication Sig  . amLODipine (NORVASC) 2.5 MG tablet Take 1 tablet (2.5 mg total) by mouth daily.  Marland Kitchen BAYER MICROLET LANCETS lancets USE AS DIRECTED TO CHECK  SUGARS 1 TO 2 TIMES A DAY  . ezetimibe (ZETIA) 10 MG tablet TAKE 1 TABLET BY MOUTH  DAILY  . glucose blood (ONETOUCH VERIO) test strip CHECK BLOOD SUGAR 6-7 TIMES  DAILY  . hydrochlorothiazide (HYDRODIURIL) 25 MG tablet TAKE 1 TABLET BY MOUTH  DAILY  . Insulin Glargine (LANTUS SOLOSTAR) 100 UNIT/ML Solostar Pen INJECT SUBCUTANEOUSLY 50  UNITS NIGHTLY  . insulin lispro (HUMALOG KWIKPEN) 100 UNIT/ML KiwkPen INJECT SUBCUTANEOUSLY 50  UNITS PER DAY  . levocetirizine (XYZAL) 5 MG tablet Take 1 tablet (5 mg total) by mouth every evening.  Marland Kitchen levofloxacin (QUIXIN) 0.5 % ophthalmic solution Place 1 drop into both eyes every 6 (six) hours.  Marland Kitchen lisinopril (PRINIVIL,ZESTRIL) 40 MG tablet TAKE 1 TABLET BY MOUTH  DAILY  . sertraline (ZOLOFT)  50 MG tablet TAKE 1 TABLET BY MOUTH  DAILY  . [DISCONTINUED] glucose blood (ONETOUCH VERIO) test strip CHECK BLOOD SUGAR 3 TIMES  DAILY  . [DISCONTINUED] levocetirizine (XYZAL) 5 MG tablet TAKE 1 TABLET BY MOUTH EVERY DAY IN THE EVENING  . [DISCONTINUED] lisinopril (PRINIVIL,ZESTRIL) 40 MG tablet Take 1 tablet (40 mg total) by mouth daily.  . [DISCONTINUED] loratadine (CLARITIN) 10 MG tablet Take 10 mg by mouth daily.  . [DISCONTINUED] Multiple Vitamin (MULTIVITAMIN) capsule Take 1 capsule by mouth daily.  . [DISCONTINUED] mupirocin cream (BACTROBAN) 2 % Apply 1 application topically 2 (two) times daily.   No facility-administered encounter medications on file as of 08/03/2018.     Review of Systems  Constitutional: Negative for appetite change and unexpected weight change.  HENT: Negative for congestion and sinus pressure.   Respiratory: Negative for cough, chest tightness and shortness of breath.   Cardiovascular: Negative for chest pain, palpitations and leg swelling.  Gastrointestinal: Negative for abdominal pain, diarrhea, nausea and vomiting.  Genitourinary: Negative for difficulty urinating and dysuria.  Musculoskeletal: Negative for joint swelling and myalgias.       Knee pain as outlined.    Skin: Negative for color change and rash.  Neurological: Negative for dizziness, light-headedness and headaches.  Psychiatric/Behavioral: Negative for agitation and dysphoric mood.       Objective:    Physical Exam  Constitutional: She appears well-developed and well-nourished. No distress.  HENT:  Nose: Nose normal.  Mouth/Throat: Oropharynx is clear and moist.  Neck: Neck supple. No thyromegaly present.  Cardiovascular: Normal rate and regular rhythm.  Pulmonary/Chest: Breath sounds normal. No respiratory distress. She has no wheezes.  Abdominal: Soft. Bowel sounds are normal. There is no tenderness.  Musculoskeletal: She exhibits no edema or tenderness.  Feet:  No lesions.  DP  pulses palpable and equal bilaterally.  Sensation intact to light touch and pin prick.    Lymphadenopathy:    She has no cervical adenopathy.  Skin: No rash noted. No erythema.  Psychiatric: She has a normal mood and affect. Her behavior is normal.    BP 140/74 (BP Location: Left Arm, Patient Position: Sitting, Cuff Size: Large)   Pulse 84   Temp 98.3 F (36.8 C) (Oral)   Resp 18   Wt 225 lb (102.1 kg)   SpO2 98%   BMI 38.62 kg/m  Wt Readings from  Last 3 Encounters:  08/03/18 225 lb (102.1 kg)  01/15/18 222 lb 3.2 oz (100.8 kg)  10/09/17 224 lb (101.6 kg)     Lab Results  Component Value Date   WBC 10.2 05/08/2017   HGB 14.3 05/08/2017   HCT 42.3 05/08/2017   PLT 252 05/08/2017   GLUCOSE 156 (H) 01/08/2018   CHOL 233 (H) 01/08/2018   TRIG 294 (H) 01/08/2018   HDL 40 01/08/2018   LDLCALC 134 (H) 01/08/2018   ALT 11 01/08/2018   AST 11 01/08/2018   NA 140 01/08/2018   K 4.0 01/08/2018   CL 100 01/08/2018   CREATININE 0.95 01/08/2018   BUN 11 01/08/2018   CO2 22 01/08/2018   TSH 1.760 01/09/2017   HGBA1C 7.6 (H) 01/08/2018    Mm Digital Diagnostic Unilat L  Result Date: 06/04/2018 CLINICAL DATA:  Short-term interval follow-up of a probable benign mass in the left breast. EXAM: DIGITAL DIAGNOSTIC LEFT MAMMOGRAM WITH CAD COMPARISON:  Previous exam(s). ACR Breast Density Category b: There are scattered areas of fibroglandular density. FINDINGS: 5 mm well circumscribed mass in the central left breast is stable compared to the prior exam dated 12/03/2017. No new suspicious mass or malignant type microcalcifications identified in the left breast. Mammographic images were processed with CAD. IMPRESSION: Stable probable benign mass in the left breast. RECOMMENDATION: Bilateral diagnostic mammogram in 6 months is recommended. I have discussed the findings and recommendations with the patient. Results were also provided in writing at the conclusion of the visit. If applicable, a  reminder letter will be sent to the patient regarding the next appointment. BI-RADS CATEGORY  3: Probably benign. Electronically Signed   By: Lillia Mountain M.D.   On: 06/04/2018 11:49       Assessment & Plan:   Problem List Items Addressed This Visit    Anxiety    On zoloft.  Stable.       B12 deficiency    Recheck B12 level.        Relevant Orders   Vitamin B12   BMI 38.0-38.9,adult    Discussed diet and exercise.  Follow.        Essential hypertension    Blood pressure as outlined.  Continue same medication regimen.  Follow pressures.  Follow metabolic panel.       Relevant Medications   amLODipine (NORVASC) 2.5 MG tablet   Hypercholesterolemia    Low cholesterol diet and exercise.  On zetia.  Discussed other treatment options.  Discussed trying another statin medication previously.  Low cholesterol diet and exercise.  Follow lipid panel.       Relevant Medications   amLODipine (NORVASC) 2.5 MG tablet   Other Relevant Orders   Hepatic function panel   Lipid panel   Nasal lesion    bactroban as directed.  Notify me if persistent.        Tobacco use    Discussed the need to quit smoking.  She has plans to set a quit date 10/21/18.        Type 1 diabetes mellitus (HCC)    Low carb diet and exercise.  Sugars as outlined.  Appeared to be dong better.  Follow met b and a1c.        Relevant Orders   CBC with Differential/Platelet   Hemoglobin A1c   TSH   VITAMIN D 25 Hydroxy (Vit-D Deficiency, Fractures)   Basic metabolic panel   Microalbumin / creatinine urine ratio    Other Visit  Diagnoses    Need for influenza vaccination    -  Primary   Relevant Orders   Flu Vaccine QUAD 6+ mos PF IM (Fluarix Quad PF) (Completed)       Einar Pheasant, MD

## 2018-08-06 ENCOUNTER — Telehealth: Payer: Self-pay | Admitting: *Deleted

## 2018-08-06 NOTE — Telephone Encounter (Signed)
Copied from CRM [id number]. Topic: General - Other >> Aug 06, 2018 12:48 PM Gaynelle Adu wrote: Reason for CRM: patient was in on Tuesday and stated she gave a form in regards to [employer], she is requesting to have that form .  Best contact number [phone]

## 2018-08-06 NOTE — Telephone Encounter (Signed)
Form placed up front for pick up. Patient said she would probably pick up next friday

## 2018-08-08 ENCOUNTER — Encounter: Payer: Self-pay | Admitting: Internal Medicine

## 2018-08-08 DIAGNOSIS — J3489 Other specified disorders of nose and nasal sinuses: Secondary | ICD-10-CM | POA: Insufficient documentation

## 2018-08-08 NOTE — Assessment & Plan Note (Signed)
On zoloft.  Stable.  

## 2018-08-08 NOTE — Assessment & Plan Note (Signed)
bactroban as directed.  Notify me if persistent.   

## 2018-08-08 NOTE — Assessment & Plan Note (Signed)
Low cholesterol diet and exercise.  On zetia.  Discussed other treatment options.  Discussed trying another statin medication previously.  Low cholesterol diet and exercise.  Follow lipid panel.

## 2018-08-08 NOTE — Assessment & Plan Note (Signed)
Discussed the need to quit smoking.  She has plans to set a quit date 10/21/18.

## 2018-08-08 NOTE — Assessment & Plan Note (Signed)
Discussed diet and exercise.  Follow.  

## 2018-08-08 NOTE — Assessment & Plan Note (Signed)
Blood pressure as outlined.  Continue same medication regimen.  Follow pressures.  Follow metabolic panel.  

## 2018-08-08 NOTE — Assessment & Plan Note (Signed)
Recheck B12 level 

## 2018-08-08 NOTE — Assessment & Plan Note (Signed)
Low carb diet and exercise.  Sugars as outlined.  Appeared to be dong better.  Follow met b and a1c.

## 2018-08-12 ENCOUNTER — Encounter: Payer: Self-pay | Admitting: Internal Medicine

## 2018-09-16 LAB — CBC WITH DIFFERENTIAL/PLATELET
BASOS: 1 %
Basophils Absolute: 0.1 10*3/uL (ref 0.0–0.2)
EOS (ABSOLUTE): 0.3 10*3/uL (ref 0.0–0.4)
EOS: 4 %
HEMATOCRIT: 39.7 % (ref 34.0–46.6)
HEMOGLOBIN: 13.2 g/dL (ref 11.1–15.9)
IMMATURE GRANS (ABS): 0 10*3/uL (ref 0.0–0.1)
IMMATURE GRANULOCYTES: 0 %
LYMPHS: 29 %
Lymphocytes Absolute: 2.6 10*3/uL (ref 0.7–3.1)
MCH: 29.3 pg (ref 26.6–33.0)
MCHC: 33.2 g/dL (ref 31.5–35.7)
MCV: 88 fL (ref 79–97)
MONOCYTES: 6 %
Monocytes Absolute: 0.6 10*3/uL (ref 0.1–0.9)
NEUTROS PCT: 60 %
Neutrophils Absolute: 5.4 10*3/uL (ref 1.4–7.0)
PLATELETS: 236 10*3/uL (ref 150–450)
RBC: 4.51 x10E6/uL (ref 3.77–5.28)
RDW: 12.8 % (ref 12.3–15.4)
WBC: 9 10*3/uL (ref 3.4–10.8)

## 2018-09-16 LAB — BASIC METABOLIC PANEL
BUN / CREAT RATIO: 11 (ref 9–23)
BUN: 12 mg/dL (ref 6–24)
CO2: 24 mmol/L (ref 20–29)
CREATININE: 1.1 mg/dL — AB (ref 0.57–1.00)
Calcium: 9.3 mg/dL (ref 8.7–10.2)
Chloride: 99 mmol/L (ref 96–106)
GFR calc Af Amer: 64 mL/min/{1.73_m2} (ref >59)
GFR, EST NON AFRICAN AMERICAN: 55 mL/min/{1.73_m2} — AB (ref >59)
GLUCOSE: 179 mg/dL — AB (ref 65–99)
Potassium: 4.1 mmol/L (ref 3.5–5.2)
SODIUM: 139 mmol/L (ref 134–144)

## 2018-09-16 LAB — MICROALBUMIN / CREATININE URINE RATIO
Creatinine, Urine: 81 mg/dL
Microalb/Creat Ratio: 10.5 mg/g creat (ref 0.0–30.0)
Microalbumin, Urine: 8.5 ug/mL

## 2018-09-16 LAB — VITAMIN D 25 HYDROXY (VIT D DEFICIENCY, FRACTURES): Vit D, 25-Hydroxy: 8.2 ng/mL — ABNORMAL LOW (ref 30.0–100.0)

## 2018-09-16 LAB — LIPID PANEL
CHOL/HDL RATIO: 5.7 ratio — AB (ref 0.0–4.4)
Cholesterol, Total: 221 mg/dL — ABNORMAL HIGH (ref 100–199)
HDL: 39 mg/dL — ABNORMAL LOW (ref >39)
LDL CALC: 122 mg/dL — AB (ref 0–99)
TRIGLYCERIDES: 298 mg/dL — AB (ref 0–149)
VLDL Cholesterol Cal: 60 mg/dL — ABNORMAL HIGH (ref 5–40)

## 2018-09-16 LAB — HEPATIC FUNCTION PANEL
ALBUMIN: 4.1 g/dL (ref 3.5–5.5)
ALK PHOS: 50 IU/L (ref 39–117)
ALT: 12 IU/L (ref 0–32)
AST: 13 IU/L (ref 0–40)
BILIRUBIN TOTAL: 0.3 mg/dL (ref 0.0–1.2)
Bilirubin, Direct: 0.09 mg/dL (ref 0.00–0.40)
TOTAL PROTEIN: 6.6 g/dL (ref 6.0–8.5)

## 2018-09-16 LAB — VITAMIN B12: VITAMIN B 12: 300 pg/mL (ref 232–1245)

## 2018-09-16 LAB — HEMOGLOBIN A1C
Est. average glucose Bld gHb Est-mCnc: 177 mg/dL
Hgb A1c MFr Bld: 7.8 % — ABNORMAL HIGH (ref 4.8–5.6)

## 2018-09-16 LAB — TSH: TSH: 2.12 u[IU]/mL (ref 0.450–4.500)

## 2018-09-18 ENCOUNTER — Other Ambulatory Visit: Payer: Self-pay | Admitting: Internal Medicine

## 2018-09-18 DIAGNOSIS — E109 Type 1 diabetes mellitus without complications: Secondary | ICD-10-CM

## 2018-09-18 NOTE — Progress Notes (Signed)
Order placed for endocrinology referral.  

## 2018-09-24 ENCOUNTER — Other Ambulatory Visit: Payer: Self-pay

## 2018-09-24 ENCOUNTER — Ambulatory Visit: Payer: Managed Care, Other (non HMO) | Admitting: Internal Medicine

## 2018-09-24 VITALS — BP 148/82 | HR 63 | Temp 97.9°F | Resp 18 | Wt 225.0 lb

## 2018-09-24 DIAGNOSIS — R944 Abnormal results of kidney function studies: Secondary | ICD-10-CM | POA: Diagnosis not present

## 2018-09-24 DIAGNOSIS — Z6838 Body mass index (BMI) 38.0-38.9, adult: Secondary | ICD-10-CM

## 2018-09-24 DIAGNOSIS — Z720 Tobacco use: Secondary | ICD-10-CM

## 2018-09-24 DIAGNOSIS — E538 Deficiency of other specified B group vitamins: Secondary | ICD-10-CM

## 2018-09-24 DIAGNOSIS — F419 Anxiety disorder, unspecified: Secondary | ICD-10-CM

## 2018-09-24 DIAGNOSIS — J3489 Other specified disorders of nose and nasal sinuses: Secondary | ICD-10-CM

## 2018-09-24 DIAGNOSIS — E109 Type 1 diabetes mellitus without complications: Secondary | ICD-10-CM

## 2018-09-24 DIAGNOSIS — I10 Essential (primary) hypertension: Secondary | ICD-10-CM

## 2018-09-24 DIAGNOSIS — E7800 Pure hypercholesterolemia, unspecified: Secondary | ICD-10-CM

## 2018-09-24 MED ORDER — CYANOCOBALAMIN 1000 MCG/ML IJ SOLN
1000.0000 ug | Freq: Once | INTRAMUSCULAR | Status: AC
  Administered 2018-09-24: 1000 ug via INTRAMUSCULAR

## 2018-09-24 MED ORDER — VITAMIN D (ERGOCALCIFEROL) 1.25 MG (50000 UNIT) PO CAPS: 50000.0000 [IU] | ORAL_CAPSULE | ORAL | 0 refills | Status: DC

## 2018-09-24 MED ORDER — AMLODIPINE BESYLATE 5 MG PO TABS: 5.0000 mg | ORAL_TABLET | Freq: Every day | ORAL | 1 refills | Status: DC

## 2018-09-24 NOTE — Patient Instructions (Signed)
Take vitamin D3 50,000units - one capsule every 7 days for 12 weeks.  Then start vitamin D3 2000 units per day.

## 2018-09-24 NOTE — Progress Notes (Signed)
Patient ID: Jill Crosby, female   DOB: October 21, 1961, 57 y.o.   MRN: [id number]   Subjective:    Patient ID: Jill Crosby, female    DOB: September 27, 1961, 57 y.o.   MRN: [id number]  HPI  Patient here for a scheduled follow up.  She reports she is doing relatively well.  States her sugars have been better.  Recently she split her lantus insulin and her sugars were better.  She brought in no recorded sugar readings.  Discussed diet and exercise.  Discussed endocrinology referral.  She declines.  States she knows what she should do and just is not watching what she eats.  Not exercising.  No chest pain.  No sob.  No acid reflux.  No abdominal pain.  Bowels moving.  Persistent nasal lesion.  Used bactroban.     Past Medical History:  Diagnosis Date  . Anxiety   . Depression   . Elevated BP   . Hypercholesterolemia   . Increased BMI   . Menopause   . Type I diabetes mellitus (HCC)    Past Surgical History:  Procedure Laterality Date  . CESAREAN SECTION  1997   Family History  Problem Relation Age of Onset  . Hypertension Mother   . Hyperlipidemia Mother   . Colon polyps Mother   . Cancer Father        lung  . Colon cancer Maternal Grandfather   . Diabetes Maternal Grandfather   . Breast cancer Maternal Grandmother   . Colon cancer Paternal Grandfather   . Breast cancer Maternal Aunt   . Heart disease Neg Hx   . Ovarian cancer Neg Hx    Social History   Socioeconomic History  . Marital status: Single    Spouse name: Not on file  . Number of children: Not on file  . Years of education: Not on file  . Highest education level: Not on file  Occupational History  . Not on file  Social Needs  . Financial resource strain: Not on file  . Food insecurity:    Worry: Not on file    Inability: Not on file  . Transportation needs:    Medical: Not on file    Non-medical: Not on file  Tobacco Use  . Smoking status: Former Smoker    Packs/day: 0.50    Years: 20.00    Pack years:  10.00    Types: Cigarettes  . Smokeless tobacco: Never Used  Substance and Sexual Activity  . Alcohol use: No    Alcohol/week: 0.0 standard drinks  . Drug use: No  . Sexual activity: Not Currently    Birth control/protection: Post-menopausal  Lifestyle  . Physical activity:    Days per week: Not on file    Minutes per session: Not on file  . Stress: Not on file  Relationships  . Social connections:    Talks on phone: Not on file    Gets together: Not on file    Attends religious service: Not on file    Active member of club or organization: Not on file    Attends meetings of clubs or organizations: Not on file    Relationship status: Not on file  Other Topics Concern  . Not on file  Social History Narrative  . Not on file    Outpatient Encounter Medications as of 09/24/2018  Medication Sig  . BAYER MICROLET LANCETS lancets USE AS DIRECTED TO CHECK  SUGARS 1 TO 2 TIMES A DAY  .  ezetimibe (ZETIA) 10 MG tablet TAKE 1 TABLET BY MOUTH  DAILY  . glucose blood (ONETOUCH VERIO) test strip CHECK BLOOD SUGAR 6-7 TIMES  DAILY  . hydrochlorothiazide (HYDRODIURIL) 25 MG tablet TAKE 1 TABLET BY MOUTH  DAILY  . Insulin Glargine (LANTUS SOLOSTAR) 100 UNIT/ML Solostar Pen INJECT SUBCUTANEOUSLY 50  UNITS NIGHTLY  . insulin lispro (HUMALOG KWIKPEN) 100 UNIT/ML KiwkPen INJECT SUBCUTANEOUSLY 50  UNITS PER DAY  . levocetirizine (XYZAL) 5 MG tablet Take 1 tablet (5 mg total) by mouth every evening.  Marland Kitchen levofloxacin (QUIXIN) 0.5 % ophthalmic solution Place 1 drop into both eyes every 6 (six) hours.  Marland Kitchen lisinopril (PRINIVIL,ZESTRIL) 40 MG tablet TAKE 1 TABLET BY MOUTH  DAILY  . mupirocin ointment (BACTROBAN) 2 % Please specify directions, refills and quantity  . sertraline (ZOLOFT) 50 MG tablet TAKE 1 TABLET BY MOUTH  DAILY  . [DISCONTINUED] amLODipine (NORVASC) 2.5 MG tablet Take 1 tablet (2.5 mg total) by mouth daily.  Marland Kitchen amLODipine (NORVASC) 5 MG tablet Take 1 tablet (5 mg total) by mouth daily.  .  Vitamin D, Ergocalciferol, (DRISDOL) 50000 units CAPS capsule Take 1 capsule (50,000 Units total) by mouth every 7 (seven) days.  . [EXPIRED] cyanocobalamin ((VITAMIN B-12)) injection 1,000 mcg    No facility-administered encounter medications on file as of 09/24/2018.     Review of Systems  Constitutional: Negative for appetite change and unexpected weight change.  HENT: Negative for congestion and sinus pressure.   Respiratory: Negative for cough, chest tightness and shortness of breath.   Cardiovascular: Negative for chest pain, palpitations and leg swelling.  Gastrointestinal: Negative for abdominal pain, diarrhea, nausea and vomiting.  Genitourinary: Negative for difficulty urinating and dysuria.  Musculoskeletal: Negative for joint swelling and myalgias.  Skin: Negative for color change and rash.  Neurological: Negative for dizziness, light-headedness and headaches.  Psychiatric/Behavioral: Negative for agitation and dysphoric mood.       Objective:    Physical Exam  Constitutional: She appears well-developed and well-nourished. No distress.  HENT:  Nose: Nose normal.  Mouth/Throat: Oropharynx is clear and moist.  Neck: Neck supple. No thyromegaly present.  Cardiovascular: Normal rate and regular rhythm.  Pulmonary/Chest: Breath sounds normal. No respiratory distress. She has no wheezes.  Abdominal: Soft. Bowel sounds are normal. There is no tenderness.  Musculoskeletal: She exhibits no edema or tenderness.  Lymphadenopathy:    She has no cervical adenopathy.  Skin: No rash noted. No erythema.  Psychiatric: She has a normal mood and affect. Her behavior is normal.    BP (!) 148/82 (BP Location: Left Arm, Patient Position: Sitting, Cuff Size: Large)   Pulse 63   Temp 97.9 F (36.6 C) (Oral)   Resp 18   Wt 225 lb (102.1 kg)   SpO2 98%   BMI 38.62 kg/m  Wt Readings from Last 3 Encounters:  09/24/18 225 lb (102.1 kg)  08/03/18 225 lb (102.1 kg)  01/15/18 222 lb 3.2  oz (100.8 kg)     Lab Results  Component Value Date   WBC 9.0 09/15/2018   HGB 13.2 09/15/2018   HCT 39.7 09/15/2018   PLT 236 09/15/2018   GLUCOSE 79 09/24/2018   CHOL 221 (H) 09/15/2018   TRIG 298 (H) 09/15/2018   HDL 39 (L) 09/15/2018   LDLCALC 122 (H) 09/15/2018   ALT 12 09/15/2018   AST 13 09/15/2018   NA 138 09/24/2018   K 4.2 09/24/2018   CL 98 09/24/2018   CREATININE 0.99 09/24/2018  BUN 10 09/24/2018   CO2 24 09/24/2018   TSH 2.120 09/15/2018   HGBA1C 7.8 (H) 09/15/2018    Mm Digital Diagnostic Unilat L  Result Date: 06/04/2018 CLINICAL DATA:  Short-term interval follow-up of a probable benign mass in the left breast. EXAM: DIGITAL DIAGNOSTIC LEFT MAMMOGRAM WITH CAD COMPARISON:  Previous exam(s). ACR Breast Density Category b: There are scattered areas of fibroglandular density. FINDINGS: 5 mm well circumscribed mass in the central left breast is stable compared to the prior exam dated 12/03/2017. No new suspicious mass or malignant type microcalcifications identified in the left breast. Mammographic images were processed with CAD. IMPRESSION: Stable probable benign mass in the left breast. RECOMMENDATION: Bilateral diagnostic mammogram in 6 months is recommended. I have discussed the findings and recommendations with the patient. Results were also provided in writing at the conclusion of the visit. If applicable, a reminder letter will be sent to the patient regarding the next appointment. BI-RADS CATEGORY  3: Probably benign. Electronically Signed   By: Baird Lyons M.D.   On: 06/04/2018 11:49       Assessment & Plan:   Problem List Items Addressed This Visit    Anxiety    On zoloft.  Stable.        B12 deficiency    B12 injections as directed.        Relevant Medications   cyanocobalamin ((VITAMIN B-12)) injection 1,000 mcg (Completed)   BMI 38.0-38.9,adult    Diet and exercise.  Follow.        Essential hypertension    Blood pressure as outlined.   Continue same medication regimen.  Follow pressures.  Follow metabolic panel.        Relevant Medications   amLODipine (NORVASC) 5 MG tablet   Hypercholesterolemia    On zetia.  Low cholesterol diet and exercise.  Follow lipid panel. Did not tolerate statin medication.        Relevant Medications   amLODipine (NORVASC) 5 MG tablet   Nasal lesion    Used Bactroban.  Persistent lesion.  Refer to ENT for evaluation.        Relevant Orders   Ambulatory referral to ENT   Tobacco use    Has her quit date set.  Discussed the need to stop smoking.  Follow.        Type 1 diabetes mellitus (HCC)    Discussed low carb diet and exercise.  She brought in no recorded sugar readings.  Have been poorly controlled.  She reports are better.  Split her lantus and sugars improved.  No low sugars.  Will try splitting the lantus.  Follow sugars.  Send in readings over the next few weeks.  She declines referral to endocrinology.         Other Visit Diagnoses    Decreased GFR    -  Primary   Stay hydrated.  Avoid antiinflammatories.  Follow metabolic panel.     Relevant Orders   Basic Metabolic Panel (BMET) (Completed)       [employer], MD

## 2018-09-25 LAB — BASIC METABOLIC PANEL
BUN / CREAT RATIO: 10 (ref 9–23)
BUN: 10 mg/dL (ref 6–24)
CO2: 24 mmol/L (ref 20–29)
Calcium: 9.7 mg/dL (ref 8.7–10.2)
Chloride: 98 mmol/L (ref 96–106)
Creatinine, Ser: 0.99 mg/dL (ref 0.57–1.00)
GFR calc non Af Amer: 63 mL/min/{1.73_m2} (ref >59)
GFR, EST AFRICAN AMERICAN: 72 mL/min/{1.73_m2} (ref >59)
GLUCOSE: 79 mg/dL (ref 65–99)
Potassium: 4.2 mmol/L (ref 3.5–5.2)
SODIUM: 138 mmol/L (ref 134–144)

## 2018-09-26 ENCOUNTER — Encounter: Payer: Self-pay | Admitting: Internal Medicine

## 2018-09-26 NOTE — Assessment & Plan Note (Signed)
Has her quit date set.  Discussed the need to stop smoking.  Follow.

## 2018-09-26 NOTE — Assessment & Plan Note (Signed)
B12 injections as directed.

## 2018-09-26 NOTE — Assessment & Plan Note (Signed)
Used Bactroban.  Persistent lesion.  Refer to ENT for evaluation.

## 2018-09-26 NOTE — Assessment & Plan Note (Signed)
On zoloft.  Stable.  

## 2018-09-26 NOTE — Assessment & Plan Note (Signed)
Blood pressure as outlined.  Continue same medication regimen.  Follow pressures.  Follow metabolic panel.  

## 2018-09-26 NOTE — Assessment & Plan Note (Signed)
Discussed low carb diet and exercise.  She brought in no recorded sugar readings.  Have been poorly controlled.  She reports are better.  Split her lantus and sugars improved.  No low sugars.  Will try splitting the lantus.  Follow sugars.  Send in readings over the next few weeks.  She declines referral to endocrinology.

## 2018-09-26 NOTE — Assessment & Plan Note (Signed)
Diet and exercise.  Follow.  

## 2018-09-26 NOTE — Assessment & Plan Note (Signed)
On zetia.  Low cholesterol diet and exercise.  Follow lipid panel. Did not tolerate statin medication.

## 2018-10-01 ENCOUNTER — Ambulatory Visit (INDEPENDENT_AMBULATORY_CARE_PROVIDER_SITE_OTHER): Payer: Managed Care, Other (non HMO)

## 2018-10-01 DIAGNOSIS — E538 Deficiency of other specified B group vitamins: Secondary | ICD-10-CM | POA: Diagnosis not present

## 2018-10-01 MED ORDER — CYANOCOBALAMIN 1000 MCG/ML IJ SOLN
1000.0000 ug | Freq: Once | INTRAMUSCULAR | Status: AC
  Administered 2018-10-01: 1000 ug via INTRAMUSCULAR

## 2018-10-01 NOTE — Progress Notes (Addendum)
Patient came in today for 2nd weekly B12 injection. Given in R Deltoid. Tolerated well and voiced no complaints or concerns at this time.  Reviewed.  Dr Lorin Picket

## 2018-10-08 ENCOUNTER — Ambulatory Visit (INDEPENDENT_AMBULATORY_CARE_PROVIDER_SITE_OTHER): Payer: Managed Care, Other (non HMO) | Admitting: *Deleted

## 2018-10-08 DIAGNOSIS — E538 Deficiency of other specified B group vitamins: Secondary | ICD-10-CM | POA: Diagnosis not present

## 2018-10-08 MED ORDER — CYANOCOBALAMIN 1000 MCG/ML IJ SOLN
1000.0000 ug | Freq: Once | INTRAMUSCULAR | Status: AC
  Administered 2018-10-08: 1000 ug via INTRAMUSCULAR

## 2018-10-08 NOTE — Progress Notes (Signed)
Patient presented for B 12 injection to left deltoid, patient voiced no concerns nor showed any signs of distress during injection. 

## 2018-10-15 ENCOUNTER — Ambulatory Visit: Payer: Managed Care, Other (non HMO) | Admitting: *Deleted

## 2018-11-18 ENCOUNTER — Ambulatory Visit (INDEPENDENT_AMBULATORY_CARE_PROVIDER_SITE_OTHER): Payer: Managed Care, Other (non HMO)

## 2018-11-18 DIAGNOSIS — E538 Deficiency of other specified B group vitamins: Secondary | ICD-10-CM | POA: Diagnosis not present

## 2018-11-18 MED ORDER — CYANOCOBALAMIN 1000 MCG/ML IJ SOLN
1000.0000 ug | Freq: Once | INTRAMUSCULAR | Status: AC
  Administered 2018-11-18: 1000 ug via INTRAMUSCULAR

## 2018-11-18 NOTE — Progress Notes (Signed)
Pt was seen today for B-12 shot given Im in the LD. Pt tolerated well.

## 2018-12-02 ENCOUNTER — Ambulatory Visit
Admission: RE | Admit: 2018-12-02 | Discharge: 2018-12-02 | Disposition: A | Discharge status: Home / Self Care | Payer: Managed Care, Other (non HMO) | Source: Ambulatory Visit | Attending: Internal Medicine | Admitting: Internal Medicine

## 2018-12-02 DIAGNOSIS — R928 Other abnormal and inconclusive findings on diagnostic imaging of breast: Secondary | ICD-10-CM | POA: Diagnosis present

## 2018-12-13 ENCOUNTER — Other Ambulatory Visit: Payer: Self-pay | Admitting: Internal Medicine

## 2018-12-16 ENCOUNTER — Other Ambulatory Visit: Payer: Self-pay | Admitting: Internal Medicine

## 2018-12-23 ENCOUNTER — Ambulatory Visit (INDEPENDENT_AMBULATORY_CARE_PROVIDER_SITE_OTHER): Payer: Managed Care, Other (non HMO)

## 2018-12-23 DIAGNOSIS — E538 Deficiency of other specified B group vitamins: Secondary | ICD-10-CM | POA: Diagnosis not present

## 2018-12-23 MED ORDER — CYANOCOBALAMIN 1000 MCG/ML IJ SOLN
1000.0000 ug | Freq: Once | INTRAMUSCULAR | Status: AC
  Administered 2018-12-23: 1000 ug via INTRAMUSCULAR

## 2018-12-23 NOTE — Progress Notes (Signed)
Patient came in to day for B-12 injection in RD. Patient tolerated well.  

## 2019-01-13 ENCOUNTER — Other Ambulatory Visit: Payer: Self-pay | Admitting: Internal Medicine

## 2019-01-27 ENCOUNTER — Ambulatory Visit (INDEPENDENT_AMBULATORY_CARE_PROVIDER_SITE_OTHER): Payer: Managed Care, Other (non HMO) | Admitting: *Deleted

## 2019-01-27 DIAGNOSIS — E538 Deficiency of other specified B group vitamins: Secondary | ICD-10-CM | POA: Diagnosis not present

## 2019-01-27 MED ORDER — CYANOCOBALAMIN 1000 MCG/ML IJ SOLN
1000.0000 ug | Freq: Once | INTRAMUSCULAR | Status: AC
  Administered 2019-01-27: 1000 ug via INTRAMUSCULAR

## 2019-01-27 NOTE — Progress Notes (Addendum)
Patient presented today for B-12 injection.  Given in left deltoid.  Patient tolerated well. No signs of distress.  Reviewed.  Dr Lorin Picket

## 2019-02-04 ENCOUNTER — Ambulatory Visit: Payer: Managed Care, Other (non HMO) | Admitting: Internal Medicine

## 2019-02-04 ENCOUNTER — Encounter: Payer: Self-pay | Admitting: Internal Medicine

## 2019-02-04 ENCOUNTER — Telehealth: Payer: Self-pay

## 2019-02-04 VITALS — BP 146/78 | HR 82 | Temp 97.6°F | Resp 16 | Wt 226.0 lb

## 2019-02-04 DIAGNOSIS — Z860100 Personal history of colon polyps, unspecified: Secondary | ICD-10-CM

## 2019-02-04 DIAGNOSIS — J3489 Other specified disorders of nose and nasal sinuses: Secondary | ICD-10-CM

## 2019-02-04 DIAGNOSIS — Z6838 Body mass index (BMI) 38.0-38.9, adult: Secondary | ICD-10-CM

## 2019-02-04 DIAGNOSIS — Z1211 Encounter for screening for malignant neoplasm of colon: Secondary | ICD-10-CM | POA: Diagnosis not present

## 2019-02-04 DIAGNOSIS — E7800 Pure hypercholesterolemia, unspecified: Secondary | ICD-10-CM

## 2019-02-04 DIAGNOSIS — F419 Anxiety disorder, unspecified: Secondary | ICD-10-CM

## 2019-02-04 DIAGNOSIS — Z720 Tobacco use: Secondary | ICD-10-CM

## 2019-02-04 DIAGNOSIS — E109 Type 1 diabetes mellitus without complications: Secondary | ICD-10-CM

## 2019-02-04 DIAGNOSIS — I10 Essential (primary) hypertension: Secondary | ICD-10-CM

## 2019-02-04 DIAGNOSIS — Z0000 Encounter for general adult medical examination without abnormal findings: Secondary | ICD-10-CM | POA: Diagnosis not present

## 2019-02-04 MED ORDER — INSULIN PEN NEEDLE 32G X 4 MM MISC: 3 refills | Status: DC

## 2019-02-04 MED ORDER — AMLODIPINE BESYLATE 10 MG PO TABS: 10.0000 mg | ORAL_TABLET | Freq: Every day | ORAL | 1 refills | Status: DC

## 2019-02-04 NOTE — Assessment & Plan Note (Addendum)
Physical today 02/04/19.  Mammogram 12/02/18 - [facility]adsIII.  Recommended diagnostic mammogram in one year.  Colonoscopy 2014.  Hemoccult cards given.

## 2019-02-04 NOTE — Progress Notes (Signed)
Patient ID: Jill Crosby, female   DOB: [birth year], 58 y.o.   MRN: [id number]   Subjective:    Patient ID: Jill Crosby, female    DOB: 04/20/1961, 58 y.o.   MRN: [id number]  HPI  Patient here for her physical exam.  Sees Dr Marcelline Mates for breast and pelvic exams.  States she is up to date.  Had normal pap 10/2016.  Has f/u 10/2019.  Saw ENT for persistent nasal lesion.  They were planning to remove.  Resolved prior to return appt to have removed.  She reports she is doing relatively well.  Discussed her sugars.  She is now taking her full dose of lantus before bed.  Feels this works better for her.  States fasting sugars have usually been <130.  Has noticed some mornings occasionally as high as 200.  Reports average around 160.  Before lunch sugars averaging 130-140.  Before dinner averaging 160-170.  Before bed:  120-130.  She has started walking.  Walks 1.5 miles per day - 4-5 days per week.  No chest pain.  No sob.  No acid reflux.  No abdominal pain.  Bowels moving.  Continues to smoke.  Discussed the need to quit smoking.     Past Medical History:  Diagnosis Date  . Anxiety   . Depression   . Elevated BP   . Hypercholesterolemia   . Increased BMI   . Menopause   . Type I diabetes mellitus ([place])    Past Surgical History:  Procedure Laterality Date  . CESAREAN SECTION  1997   Family History  Problem Relation Age of Onset  . Hypertension Mother   . Hyperlipidemia Mother   . Colon polyps Mother   . Cancer Father        lung  . Colon cancer Maternal Grandfather   . Diabetes Maternal Grandfather   . Breast cancer Maternal Grandmother   . Colon cancer Paternal Grandfather   . Breast cancer Maternal Aunt   . Heart disease Neg Hx   . Ovarian cancer Neg Hx    Social History   Socioeconomic History  . Marital status: Single    Spouse name: Not on file  . Number of children: Not on file  . Years of education: Not on file  . Highest education level: Not on file  Occupational  History  . Not on file  Social Needs  . Financial resource strain: Not on file  . Food insecurity:    Worry: Not on file    Inability: Not on file  . Transportation needs:    Medical: Not on file    Non-medical: Not on file  Tobacco Use  . Smoking status: Former Smoker    Packs/day: 0.50    Years: 20.00    Pack years: 10.00    Types: Cigarettes  . Smokeless tobacco: Never Used  Substance and Sexual Activity  . Alcohol use: No    Alcohol/week: 0.0 standard drinks  . Drug use: No  . Sexual activity: Not Currently    Birth control/protection: Post-menopausal  Lifestyle  . Physical activity:    Days per week: Not on file    Minutes per session: Not on file  . Stress: Not on file  Relationships  . Social connections:    Talks on phone: Not on file    Gets together: Not on file    Attends religious service: Not on file    Active member of club or organization: Not on file  Attends meetings of clubs or organizations: Not on file    Relationship status: Not on file  Other Topics Concern  . Not on file  Social History Narrative  . Not on file    Outpatient Encounter Medications as of 02/04/2019  Medication Sig  . BAYER MICROLET LANCETS lancets USE AS DIRECTED TO CHECK  SUGARS 1 TO 2 TIMES A DAY  . cholecalciferol (VITAMIN D3) 25 MCG (1000 UT) tablet Take 1,000 Units by mouth daily.  Marland Kitchen ezetimibe (ZETIA) 10 MG tablet TAKE 1 TABLET BY MOUTH  DAILY  . glucose blood (ONETOUCH VERIO) test strip CHECK BLOOD SUGAR 6-7 TIMES  DAILY  . hydrochlorothiazide (HYDRODIURIL) 25 MG tablet TAKE 1 TABLET BY MOUTH  DAILY  . Insulin Glargine (LANTUS SOLOSTAR) 100 UNIT/ML Solostar Pen INJECT SUBCUTANEOUSLY 50  UNITS NIGHTLY  . insulin lispro (HUMALOG KWIKPEN) 100 UNIT/ML KwikPen INJECT SUBCUTANEOUSLY 50  UNITS DAILY  . levocetirizine (XYZAL) 5 MG tablet Take 1 tablet (5 mg total) by mouth every evening.  Marland Kitchen levofloxacin (QUIXIN) 0.5 % ophthalmic solution Place 1 drop into both eyes every 6 (six)  hours.  Marland Kitchen lisinopril (PRINIVIL,ZESTRIL) 40 MG tablet TAKE 1 TABLET BY MOUTH  DAILY  . mupirocin ointment (BACTROBAN) 2 % Please specify directions, refills and quantity  . sertraline (ZOLOFT) 50 MG tablet TAKE 1 TABLET BY MOUTH  DAILY  . [DISCONTINUED] amLODipine (NORVASC) 5 MG tablet Take 1 tablet (5 mg total) by mouth daily.  . [DISCONTINUED] Vitamin D, Ergocalciferol, (DRISDOL) 1.25 MG (50000 UT) CAPS capsule TAKE 1 CAPSULE (50,000 UNITS TOTAL) BY MOUTH EVERY 7 (SEVEN) DAYS.  Marland Kitchen amLODipine (NORVASC) 10 MG tablet Take 1 tablet (10 mg total) by mouth daily.  . Insulin Pen Needle 32G X 4 MM MISC Use as directed with insulin   No facility-administered encounter medications on file as of 02/04/2019.     Review of Systems  Constitutional: Negative for appetite change and unexpected weight change.  HENT: Negative for congestion and sinus pressure.   Eyes: Negative for pain and visual disturbance.  Respiratory: Negative for cough, chest tightness and shortness of breath.   Cardiovascular: Negative for chest pain, palpitations and leg swelling.  Gastrointestinal: Negative for abdominal pain, diarrhea, nausea and vomiting.  Genitourinary: Negative for difficulty urinating and dysuria.  Musculoskeletal: Negative for joint swelling and myalgias.  Skin: Negative for color change and rash.  Neurological: Negative for dizziness, light-headedness and headaches.  Hematological: Negative for adenopathy. Does not bruise/bleed easily.  Psychiatric/Behavioral: Negative for agitation and dysphoric mood.       Objective:    Physical Exam Constitutional:      General: She is not in acute distress.    Appearance: Normal appearance.  HENT:     Nose: Nose normal. No congestion.     Mouth/Throat:     Pharynx: No oropharyngeal exudate or posterior oropharyngeal erythema.  Neck:     Musculoskeletal: Neck supple. No muscular tenderness.     Thyroid: No thyromegaly.  Cardiovascular:     Rate and Rhythm:  Normal rate and regular rhythm.  Pulmonary:     Effort: No respiratory distress.     Breath sounds: Normal breath sounds. No wheezing.     Comments: Breast exam:  Performed by gyn.  Abdominal:     General: Bowel sounds are normal.     Palpations: Abdomen is soft.     Tenderness: There is no abdominal tenderness.  Genitourinary:    Comments: Performed by gyn.  Musculoskeletal:  General: No swelling or tenderness.  Lymphadenopathy:     Cervical: No cervical adenopathy.  Skin:    Findings: No erythema or rash.  Neurological:     Mental Status: She is alert.  Psychiatric:        Mood and Affect: Mood normal.        Behavior: Behavior normal.     BP (!) 146/78   Pulse 82   Temp 97.6 F (36.4 C) (Oral)   Resp 16   Wt 226 lb (102.5 kg)   SpO2 96%   BMI 38.79 kg/m  Wt Readings from Last 3 Encounters:  02/04/19 226 lb (102.5 kg)  09/24/18 225 lb (102.1 kg)  08/03/18 225 lb (102.1 kg)     Lab Results  Component Value Date   WBC 9.0 09/15/2018   HGB 13.2 09/15/2018   HCT 39.7 09/15/2018   PLT 236 09/15/2018   GLUCOSE 79 09/24/2018   CHOL 221 (H) 09/15/2018   TRIG 298 (H) 09/15/2018   HDL 39 (L) 09/15/2018   LDLCALC 122 (H) 09/15/2018   ALT 12 09/15/2018   AST 13 09/15/2018   NA 138 09/24/2018   K 4.2 09/24/2018   CL 98 09/24/2018   CREATININE 0.99 09/24/2018   BUN 10 09/24/2018   CO2 24 09/24/2018   TSH 2.120 09/15/2018   HGBA1C 7.8 (H) 09/15/2018    Mm Diag Breast Tomo Bilateral  Result Date: 12/02/2018 CLINICAL DATA:  One year follow-up for a probably benign few mm nodule in the left breast. EXAM: DIGITAL DIAGNOSTIC BILATERAL MAMMOGRAM WITH CAD AND TOMO COMPARISON:  Previous exam(s). ACR Breast Density Category b: There are scattered areas of fibroglandular density. FINDINGS: Mammographically, there are no suspicious masses, areas of architectural distortion or microcalcifications either breast. The previously noted 4 mm circumscribed likely fat  containing benign-appearing nodule in the slightly outer left breast, middle to posterior depth, is stable. Mammographic images were processed with CAD. IMPRESSION: Stable probably benign 4 mm left lateral breast nodule, for which 1 year follow-up is recommended. RECOMMENDATION: Diagnostic mammogram is suggested in 1 year. (Code:DM-B-01Y) I have discussed the findings and recommendations with the patient. Results were also provided in writing at the conclusion of the visit. If applicable, a reminder letter will be sent to the patient regarding the next appointment. BI-RADS CATEGORY  3: Probably benign. Electronically Signed   By: Fidela Salisbury M.D.   On: 12/02/2018 14:31       Assessment & Plan:   Problem List Items Addressed This Visit    Anxiety    On zoloft.  Stable.        BMI 38.0-38.9,adult    Discussed diet and exercise.  Follow.        Essential hypertension    Blood pressure remains elevated.  Increase amlodipine to 36m q day.  Follow pressures.  Follow metabolic panel.        Relevant Medications   amLODipine (NORVASC) 10 MG tablet   Health care maintenance    Physical today 02/04/19.  Mammogram 12/02/18 - [facility]ads[facility].  Recommended diagnostic mammogram in one year.  Colonoscopy 2014.  Hemoccult cards given.       History of colonic polyps    In reviewing, it appears she is due for colonoscopy.  Will contact and arrange.        Hypercholesterolemia    Did not tolerate statin medication.  On zetia.  Low cholesterol diet and exercise.  Follow lipid panel.  Relevant Medications   amLODipine (NORVASC) 10 MG tablet   Other Relevant Orders   Hepatic function panel   Lipid panel   Nasal lesion    Saw ENT.  Resolved.       Tobacco use    Continues to smoke.  Discussed the need to stop smoking.  See if qualifies for screening CT chest.       Type 1 diabetes mellitus ([place])    Discussed low carb diet and exercise.  Sugars per her report as outlined.  Brought in no  sugar readings. She adjust her insulin.  Check met b and a1c.  Keep up to date with eye exams.        Relevant Orders   Hemoglobin T7D   Basic metabolic panel    Other Visit Diagnoses    Screening for colon cancer    -  Primary   Relevant Orders   Fecal occult blood, imunochemical([facility])       Einar Pheasant, MD

## 2019-02-04 NOTE — Telephone Encounter (Signed)
Copied from CRM [id number]. Topic: Referral - Status >> Feb 04, 2019  3:11 PM Crist Infante wrote: Reason for CRM: [facility] states they have no records on the pt to send

## 2019-02-06 ENCOUNTER — Encounter: Payer: Self-pay | Admitting: Internal Medicine

## 2019-02-06 NOTE — Assessment & Plan Note (Signed)
On zoloft.  Stable.  

## 2019-02-06 NOTE — Assessment & Plan Note (Signed)
Did not tolerate statin medication.  On zetia.  Low cholesterol diet and exercise.  Follow lipid panel.  

## 2019-02-06 NOTE — Assessment & Plan Note (Signed)
Blood pressure remains elevated.  Increase amlodipine to 10mg q day.  Follow pressures.  Follow metabolic panel.   

## 2019-02-06 NOTE — Assessment & Plan Note (Signed)
In reviewing, it appears she is due for colonoscopy.  Will contact and arrange.

## 2019-02-06 NOTE — Assessment & Plan Note (Signed)
Discussed low carb diet and exercise.  Sugars per her report as outlined.  Brought in no sugar readings. She adjust her insulin.  Check met b and a1c.  Keep up to date with eye exams.

## 2019-02-06 NOTE — Assessment & Plan Note (Addendum)
Continues to smoke.  Discussed the need to stop smoking.  See if qualifies for screening CT chest.

## 2019-02-06 NOTE — Assessment & Plan Note (Signed)
Discussed diet and exercise.  Follow.  

## 2019-02-06 NOTE — Assessment & Plan Note (Signed)
Saw ENT.  Resolved.  

## 2019-02-07 ENCOUNTER — Telehealth: Payer: Self-pay | Admitting: *Deleted

## 2019-02-07 NOTE — Telephone Encounter (Signed)
Received referral for low dose lung cancer screening CT scan. Message left at phone number listed in EMR for patient to call me back to facilitate scheduling scan.  

## 2019-02-08 ENCOUNTER — Telehealth: Payer: Self-pay | Admitting: *Deleted

## 2019-02-08 NOTE — Telephone Encounter (Signed)
Received referral for low dose lung cancer screening CT scan. Message left at phone number listed in EMR for patient to call me back to facilitate scheduling scan.  

## 2019-02-08 NOTE — Telephone Encounter (Signed)
-----   Message from Dale Spiceland, MD sent at 02/06/2019  9:34 AM EDT ----- Regarding: colonoscopy Notify pt that after she left, I reviewed her chart and it appears she is due for colonoscopy now.  Per info I received from Dr Markham Jordan, he had wanted to repeat in 12/2018.  If agreeable, let me know and I will place the order for the referral.  Thanks.    Dr Lorin Picket

## 2019-02-08 NOTE — Telephone Encounter (Signed)
Tried to reach patient by phone no answer left messagto  Call office [facility] nurse may advise.

## 2019-02-09 ENCOUNTER — Telehealth: Payer: Self-pay | Admitting: *Deleted

## 2019-02-09 ENCOUNTER — Encounter: Payer: Self-pay | Admitting: *Deleted

## 2019-02-09 NOTE — Telephone Encounter (Signed)
Received referral for low dose lung cancer screening CT scan. Message left at phone number listed in EMR for patient to call me back to facilitate scheduling scan.  

## 2019-02-25 ENCOUNTER — Encounter: Payer: Self-pay | Admitting: *Deleted

## 2019-02-25 NOTE — Telephone Encounter (Signed)
Mailed letter °

## 2019-03-01 ENCOUNTER — Ambulatory Visit: Payer: Managed Care, Other (non HMO)

## 2019-03-03 ENCOUNTER — Other Ambulatory Visit: Payer: Self-pay

## 2019-03-03 ENCOUNTER — Ambulatory Visit (INDEPENDENT_AMBULATORY_CARE_PROVIDER_SITE_OTHER): Payer: Managed Care, Other (non HMO)

## 2019-03-03 DIAGNOSIS — E538 Deficiency of other specified B group vitamins: Secondary | ICD-10-CM

## 2019-03-03 MED ORDER — CYANOCOBALAMIN 1000 MCG/ML IJ SOLN
1000.0000 ug | Freq: Once | INTRAMUSCULAR | Status: AC
  Administered 2019-03-03: 1000 ug via INTRAMUSCULAR

## 2019-03-03 NOTE — Progress Notes (Addendum)
Jill Crosby presents today for injection per MD orders. B12 injection administered IM in right Upper Arm. Administration without incident. Patient tolerated well.  Jill Crosby,cma  Reviewed.  Dr Lorin Picket

## 2019-04-05 ENCOUNTER — Ambulatory Visit (INDEPENDENT_AMBULATORY_CARE_PROVIDER_SITE_OTHER): Payer: Managed Care, Other (non HMO) | Admitting: *Deleted

## 2019-04-05 ENCOUNTER — Other Ambulatory Visit: Payer: Self-pay

## 2019-04-05 DIAGNOSIS — E538 Deficiency of other specified B group vitamins: Secondary | ICD-10-CM

## 2019-04-05 MED ORDER — CYANOCOBALAMIN 1000 MCG/ML IJ SOLN
1000.0000 ug | Freq: Once | INTRAMUSCULAR | Status: AC
  Administered 2019-04-05: 1000 ug via INTRAMUSCULAR

## 2019-04-05 NOTE — Progress Notes (Addendum)
Patient presented for B 12 injection to left deltoid, patient voiced no concerns nor showed any signs of distress during injection.  Reviewed.  Dr Scott 

## 2019-05-10 ENCOUNTER — Other Ambulatory Visit: Payer: Self-pay

## 2019-05-10 ENCOUNTER — Ambulatory Visit (INDEPENDENT_AMBULATORY_CARE_PROVIDER_SITE_OTHER): Payer: Managed Care, Other (non HMO)

## 2019-05-10 DIAGNOSIS — E538 Deficiency of other specified B group vitamins: Secondary | ICD-10-CM

## 2019-05-10 MED ORDER — CYANOCOBALAMIN 1000 MCG/ML IJ SOLN
1000.0000 ug | Freq: Once | INTRAMUSCULAR | Status: AC
  Administered 2019-05-10: 1000 ug via INTRAMUSCULAR

## 2019-05-10 NOTE — Progress Notes (Addendum)
Patient came in today for B-12 injection in right deltoid. Patient tolerated well.   Reviewed.  Dr Scott 

## 2019-05-18 ENCOUNTER — Other Ambulatory Visit: Payer: Self-pay | Admitting: Internal Medicine

## 2019-06-04 ENCOUNTER — Other Ambulatory Visit: Payer: Self-pay | Admitting: Internal Medicine

## 2019-06-05 ENCOUNTER — Other Ambulatory Visit: Payer: Self-pay | Admitting: Internal Medicine

## 2019-06-14 ENCOUNTER — Ambulatory Visit (INDEPENDENT_AMBULATORY_CARE_PROVIDER_SITE_OTHER): Payer: Managed Care, Other (non HMO)

## 2019-06-14 ENCOUNTER — Other Ambulatory Visit: Payer: Self-pay

## 2019-06-14 DIAGNOSIS — E538 Deficiency of other specified B group vitamins: Secondary | ICD-10-CM | POA: Diagnosis not present

## 2019-06-14 MED ORDER — CYANOCOBALAMIN 1000 MCG/ML IJ SOLN
1000.0000 ug | Freq: Once | INTRAMUSCULAR | Status: AC
  Administered 2019-06-14: 1000 ug via INTRAMUSCULAR

## 2019-06-14 NOTE — Progress Notes (Signed)
Pt came in for b12 inj. Given in L deltoid. Tolerated well No complaints at this time.

## 2019-06-20 NOTE — Progress Notes (Signed)
I have reviewed the above note and agree.  Kaleem Sartwell, M.D.  

## 2019-06-21 ENCOUNTER — Other Ambulatory Visit: Payer: Self-pay | Admitting: Internal Medicine

## 2019-06-27 ENCOUNTER — Ambulatory Visit (INDEPENDENT_AMBULATORY_CARE_PROVIDER_SITE_OTHER): Payer: Managed Care, Other (non HMO) | Admitting: Internal Medicine

## 2019-06-27 ENCOUNTER — Other Ambulatory Visit: Payer: Self-pay

## 2019-06-27 DIAGNOSIS — E7800 Pure hypercholesterolemia, unspecified: Secondary | ICD-10-CM

## 2019-06-27 DIAGNOSIS — E109 Type 1 diabetes mellitus without complications: Secondary | ICD-10-CM | POA: Diagnosis not present

## 2019-06-27 DIAGNOSIS — F419 Anxiety disorder, unspecified: Secondary | ICD-10-CM

## 2019-06-27 DIAGNOSIS — E559 Vitamin D deficiency, unspecified: Secondary | ICD-10-CM | POA: Diagnosis not present

## 2019-06-27 DIAGNOSIS — I10 Essential (primary) hypertension: Secondary | ICD-10-CM | POA: Diagnosis not present

## 2019-06-27 DIAGNOSIS — E538 Deficiency of other specified B group vitamins: Secondary | ICD-10-CM

## 2019-06-27 NOTE — Progress Notes (Signed)
Virtual Visit via telephone Note  This visit type was conducted due to national recommendations for restrictions regarding the COVID-19 pandemic (e.g. social distancing).  This format is felt to be most appropriate for this patient at this time.  All issues noted in this document were discussed and addressed.  No physical exam was performed (except for noted visual exam findings with Video Visits).   I connected with@ today at  3:30 PM EDT by a video enabled telemedicine application or telephone and verified that I am speaking with the correct person using two identifiers. Location patient: home Location provider: work or home office Persons participating in the virtual visit: patient, provider  I discussed the limitations, risks, security and privacy concerns of performing an evaluation and management service by telephone and the availability of in person appointments.  The patient expressed understanding and agreed to proceed.   Reason for visit: scheduled follow up  HPI: She reports she is doing well.  Feels good.  Working from home.  Decreased stress.  No chest pain.  No sob.  No acid reflux.  No abdominal pain.  Bowels moving.  Saw gyn for breast and pelvic exam.  Taking zoloft.  Doing well on this medication.  Previous visit, amlodipine increased to 22m q day.  States blood pressures doing well.  Reports am sugars 130s and lunch sugars - 150s.     ROS: See pertinent positives and negatives per HPI.  Past Medical History:  Diagnosis Date  . Anxiety   . Depression   . Elevated BP   . Hypercholesterolemia   . Increased BMI   . Menopause   . [place] I diabetes mellitus [place]     Past Surgical History:  Procedure Laterality Date  . CESAREAN SECTION  1997    Family History  Problem Relation Age of Onset  . Hypertension Mother   . Hyperlipidemia Mother   . Colon polyps Mother   . Cancer Father        lung  . Colon cancer Maternal Grandfather   . Diabetes Maternal  Grandfather   . Breast cancer Maternal Grandmother   . Colon cancer Paternal Grandfather   . Breast cancer Maternal Aunt   . Heart disease Neg Hx   . Ovarian cancer Neg Hx     SOCIAL HX: reviewed.    Current Outpatient Medications:  .  amLODipine (NORVASC) 10 MG tablet, TAKE 1 TABLET BY MOUTH  DAILY, Disp: 90 tablet, Rfl: 1 .  BAYER MICROLET LANCETS lancets, USE AS DIRECTED TO CHECK  SUGARS 1 TO 2 TIMES A DAY, Disp: 300 each, Rfl: 2 .  cholecalciferol (VITAMIN D3) 25 MCG (1000 UT) tablet, Take 1,000 Units by mouth daily., Disp: , Rfl:  .  ezetimibe (ZETIA) 10 MG tablet, TAKE 1 TABLET BY MOUTH  DAILY, Disp: 90 tablet, Rfl: 1 .  glucose blood (ONETOUCH VERIO) test strip, CHECK BLOOD SUGAR 6-7 TIMES  DAILY, Disp: 700 each, Rfl: 6 .  hydrochlorothiazide (HYDRODIURIL) 25 MG tablet, TAKE 1 TABLET BY MOUTH  DAILY, Disp: 90 tablet, Rfl: 1 .  Insulin Glargine (LANTUS SOLOSTAR) 100 UNIT/ML Solostar Pen, INJECT SUBCUTANEOUSLY 50  UNITS NIGHTLY, Disp: 45 mL, Rfl: 1 .  insulin lispro (HUMALOG KWIKPEN) 100 UNIT/ML KwikPen, INJECT SUBCUTANEOUSLY 50  UNITS DAILY, Disp: 45 mL, Rfl: 1 .  Insulin Pen Needle 32G X 4 MM MISC, Use as directed with insulin, Disp: 200 each, Rfl: 3 .  levocetirizine (XYZAL) 5 MG tablet, TAKE 1 TABLET BY MOUTH  EVERY EVENING, Disp: 90 tablet, Rfl: 0 .  levofloxacin (QUIXIN) 0.5 % ophthalmic solution, Place 1 drop into both eyes every 6 (six) hours., Disp: 5 mL, Rfl: 0 .  lisinopril (ZESTRIL) 40 MG tablet, TAKE 1 TABLET BY MOUTH  DAILY, Disp: 90 tablet, Rfl: 2 .  mupirocin ointment (BACTROBAN) 2 %, Please specify directions, refills and quantity, Disp: 1 g, Rfl: 0 .  sertraline (ZOLOFT) 50 MG tablet, TAKE 1 TABLET BY MOUTH  DAILY, Disp: 90 tablet, Rfl: 1  EXAM:  GENERAL: alert.  Sounds to be in no acute distress.  Answering questions appropriately.    PSYCH/NEURO: pleasant and cooperative, no obvious depression or anxiety, speech and thought processing grossly intact   ASSESSMENT AND PLAN:  Discussed the following assessment and plan:  Anxiety Doing better.  On zoloft.  Follow.    Essential hypertension Reports blood pressures doing ok on current medication.  Follow metabolic panel.  Follow pressures.    Hypercholesterolemia Did not tolerate statin medication.  On zetia.  Low cholesterol diet and exercise.  Follow lipid panel.    Type 1 diabetes mellitus Discussed low carb diet and exercise.  Sugars as outlined.  Continue current medication regimen.  Follow met b and a1c.      I discussed the assessment and treatment plan with the patient. The patient was provided an opportunity to ask questions and all were answered. The patient agreed with the plan and demonstrated an understanding of the instructions.   The patient was advised to call back or seek an in-person evaluation if the symptoms worsen or if the condition fails to improve as anticipated.  I provided 26 minutes of non-face-to-face time during this encounter.   Einar Pheasant, MD

## 2019-07-02 ENCOUNTER — Encounter: Payer: Self-pay | Admitting: Internal Medicine

## 2019-07-02 NOTE — Assessment & Plan Note (Signed)
Discussed low carb diet and exercise.  Sugars as outlined.  Continue current medication regimen.  Follow met b and a1c.

## 2019-07-02 NOTE — Assessment & Plan Note (Signed)
Reports blood pressures doing ok on current medication.  Follow metabolic panel.  Follow pressures.

## 2019-07-02 NOTE — Assessment & Plan Note (Signed)
Doing better.  On zoloft.  Follow.  

## 2019-07-02 NOTE — Assessment & Plan Note (Addendum)
Did not tolerate statin medication.  On zetia.  Low cholesterol diet and exercise.  Follow lipid panel.  

## 2019-07-03 ENCOUNTER — Other Ambulatory Visit: Payer: Self-pay | Admitting: Internal Medicine

## 2019-07-19 ENCOUNTER — Other Ambulatory Visit: Payer: Self-pay

## 2019-07-19 ENCOUNTER — Ambulatory Visit (INDEPENDENT_AMBULATORY_CARE_PROVIDER_SITE_OTHER): Payer: Managed Care, Other (non HMO) | Admitting: *Deleted

## 2019-07-19 DIAGNOSIS — E538 Deficiency of other specified B group vitamins: Secondary | ICD-10-CM | POA: Diagnosis not present

## 2019-07-19 MED ORDER — CYANOCOBALAMIN 1000 MCG/ML IJ SOLN
1000.0000 ug | Freq: Once | INTRAMUSCULAR | Status: AC
  Administered 2019-07-19: 16:00:00 1000 ug via INTRAMUSCULAR

## 2019-07-19 NOTE — Progress Notes (Addendum)
Patient presented for B 12 injection to right deltoid, patient voiced no concerns nor showed any signs of distress during injection.  Reviewed.  Dr Scott 

## 2019-08-23 ENCOUNTER — Other Ambulatory Visit: Payer: Self-pay

## 2019-08-23 ENCOUNTER — Ambulatory Visit (INDEPENDENT_AMBULATORY_CARE_PROVIDER_SITE_OTHER): Payer: Managed Care, Other (non HMO) | Admitting: *Deleted

## 2019-08-23 DIAGNOSIS — Z23 Encounter for immunization: Secondary | ICD-10-CM | POA: Diagnosis not present

## 2019-08-23 DIAGNOSIS — E538 Deficiency of other specified B group vitamins: Secondary | ICD-10-CM

## 2019-08-23 MED ORDER — CYANOCOBALAMIN 1000 MCG/ML IJ SOLN
1000.0000 ug | Freq: Once | INTRAMUSCULAR | Status: AC
  Administered 2019-08-23: 16:00:00 1000 ug via INTRAMUSCULAR

## 2019-08-23 NOTE — Progress Notes (Addendum)
Patient presented for B 12 injection to right deltoid, patient voiced no concerns nor showed any signs of distress during injection.  Reviewed.  Dr Scott 

## 2019-09-06 ENCOUNTER — Other Ambulatory Visit: Payer: Self-pay | Admitting: Internal Medicine

## 2019-09-26 ENCOUNTER — Other Ambulatory Visit: Payer: Self-pay

## 2019-09-26 ENCOUNTER — Ambulatory Visit (INDEPENDENT_AMBULATORY_CARE_PROVIDER_SITE_OTHER): Payer: Managed Care, Other (non HMO)

## 2019-09-26 DIAGNOSIS — E538 Deficiency of other specified B group vitamins: Secondary | ICD-10-CM | POA: Diagnosis not present

## 2019-09-26 MED ORDER — CYANOCOBALAMIN 1000 MCG/ML IJ SOLN
1000.0000 ug | Freq: Once | INTRAMUSCULAR | Status: AC
  Administered 2019-09-26: 1000 ug via INTRAMUSCULAR

## 2019-09-26 NOTE — Progress Notes (Addendum)
Patient presented for B 12 injection to left deltoid, patient voiced no concerns nor showed any signs of distress during injection.  Reviewed.  Dr Scott 

## 2019-10-26 LAB — HEPATIC FUNCTION PANEL
ALT: 13 IU/L (ref 0–32)
AST: 18 IU/L (ref 0–40)
Albumin: 4.3 g/dL (ref 3.8–4.9)
Alkaline Phosphatase: 70 IU/L (ref 39–117)
Bilirubin Total: 0.3 mg/dL (ref 0.0–1.2)
Bilirubin, Direct: 0.1 mg/dL (ref 0.00–0.40)
Total Protein: 6.9 g/dL (ref 6.0–8.5)

## 2019-10-26 LAB — LIPID PANEL
Chol/HDL Ratio: 5.6 ratio — ABNORMAL HIGH (ref 0.0–4.4)
Cholesterol, Total: 240 mg/dL — ABNORMAL HIGH (ref 100–199)
HDL: 43 mg/dL (ref >39)
LDL Chol Calc (NIH): 129 mg/dL — ABNORMAL HIGH (ref 0–99)
Triglycerides: 381 mg/dL — ABNORMAL HIGH (ref 0–149)
VLDL Cholesterol Cal: 68 mg/dL — ABNORMAL HIGH (ref 5–40)

## 2019-10-26 LAB — BASIC METABOLIC PANEL
BUN/Creatinine Ratio: 15 (ref 12–28)
BUN: 11 mg/dL (ref 8–27)
CO2: 24 mmol/L (ref 20–29)
Calcium: 9.7 mg/dL (ref 8.7–10.3)
Chloride: 99 mmol/L (ref 96–106)
Creatinine, Ser: 0.73 mg/dL (ref 0.57–1.00)
GFR calc Af Amer: 104 mL/min/{1.73_m2} (ref >59)
GFR calc non Af Amer: 90 mL/min/{1.73_m2} (ref >59)
Glucose: 105 mg/dL — ABNORMAL HIGH (ref 65–99)
Potassium: 4 mmol/L (ref 3.5–5.2)
Sodium: 139 mmol/L (ref 134–144)

## 2019-10-26 LAB — HEMOGLOBIN A1C
Est. average glucose Bld gHb Est-mCnc: 163 mg/dL
Hgb A1c MFr Bld: 7.3 % — ABNORMAL HIGH (ref 4.8–5.6)

## 2019-10-26 LAB — VITAMIN B12: Vitamin B-12: 556 pg/mL (ref 232–1245)

## 2019-10-26 LAB — VITAMIN D 25 HYDROXY (VIT D DEFICIENCY, FRACTURES): Vit D, 25-Hydroxy: 21.9 ng/mL — ABNORMAL LOW (ref 30.0–100.0)

## 2019-10-31 ENCOUNTER — Ambulatory Visit: Payer: Managed Care, Other (non HMO)

## 2019-11-01 ENCOUNTER — Encounter: Payer: Self-pay | Admitting: Internal Medicine

## 2019-11-01 ENCOUNTER — Ambulatory Visit (INDEPENDENT_AMBULATORY_CARE_PROVIDER_SITE_OTHER): Payer: Managed Care, Other (non HMO) | Admitting: Internal Medicine

## 2019-11-01 ENCOUNTER — Other Ambulatory Visit: Payer: Self-pay

## 2019-11-01 DIAGNOSIS — E7800 Pure hypercholesterolemia, unspecified: Secondary | ICD-10-CM

## 2019-11-01 DIAGNOSIS — F419 Anxiety disorder, unspecified: Secondary | ICD-10-CM | POA: Diagnosis not present

## 2019-11-01 DIAGNOSIS — E538 Deficiency of other specified B group vitamins: Secondary | ICD-10-CM | POA: Diagnosis not present

## 2019-11-01 DIAGNOSIS — Z86010 Personal history of colonic polyps: Secondary | ICD-10-CM

## 2019-11-01 DIAGNOSIS — I10 Essential (primary) hypertension: Secondary | ICD-10-CM

## 2019-11-01 DIAGNOSIS — E559 Vitamin D deficiency, unspecified: Secondary | ICD-10-CM

## 2019-11-01 DIAGNOSIS — R42 Dizziness and giddiness: Secondary | ICD-10-CM

## 2019-11-01 DIAGNOSIS — E109 Type 1 diabetes mellitus without complications: Secondary | ICD-10-CM

## 2019-11-01 MED ORDER — MECLIZINE HCL 12.5 MG PO TABS: 12.5000 mg | ORAL_TABLET | Freq: Two times a day (BID) | ORAL | 0 refills | Status: DC | PRN

## 2019-11-01 MED ORDER — ONDANSETRON 4 MG PO TBDP: 4.0000 mg | ORAL_TABLET | Freq: Two times a day (BID) | ORAL | 0 refills | Status: DC | PRN

## 2019-11-01 NOTE — Progress Notes (Signed)
Patient ID: Jill Crosby, female   DOB: Feb 21, 1961, 58 y.o.   MRN: [id number]   Virtual Visit via video Note  This visit type was conducted due to national recommendations for restrictions regarding the COVID-19 pandemic (e.g. social distancing).  This format is felt to be most appropriate for this patient at this time.  All issues noted in this document were discussed and addressed.  No physical exam was performed (except for noted visual exam findings with Video Visits).   I connected with Michael Boston by video and verified that I am speaking with the correct person using two identifiers. Location patient: home Location provider: work Persons participating in the virtual visit: patient, provider  I discussed the limitations, risks, security and privacy concerns of performing an evaluation and management service by video and the availability of in person appointments.  The patient expressed understanding and agreed to proceed.   Reason for visit: scheduled follow up  HPI: She reports that two days ago she had some diarrhea.  No fever.  No nausea or vomiting.  No abdominal pain.  No blood.  Is better now.  One episode of diarrhea yesterday.  Today, stool more formed.  She is eating.  No chest pain.  No sob.  No acid reflux.  Discussed labs.  Elevated triglycerides.  Discussed low carb diet.  Will send information - [facility] lipid diet.  B12 level wnl.  Will change to oral B12.  Vitamin D level improved.  Continue current vitamin D supplements.  Was evaluated at ENT a few days ago.  Blood pressure - 124/68. Handling stress.  Working from home.  This is going well.  A1c has improved.  AM sugars averaging 90-110.  PM sugars 160-170.  Overall she feels she is doing relatively well.    ROS: See pertinent positives and negatives per HPI.  Past Medical History:  Diagnosis Date  . Anxiety   . Depression   . Elevated BP   . Hypercholesterolemia   . Increased BMI   . Menopause   . Type I  diabetes mellitus ([place])     Past Surgical History:  Procedure Laterality Date  . CESAREAN SECTION  1997    Family History  Problem Relation Age of Onset  . Hypertension Mother   . Hyperlipidemia Mother   . Colon polyps Mother   . Cancer Father        lung  . Colon cancer Maternal Grandfather   . Diabetes Maternal Grandfather   . Breast cancer Maternal Grandmother   . Colon cancer Paternal Grandfather   . Breast cancer Maternal Aunt   . Heart disease Neg Hx   . Ovarian cancer Neg Hx     SOCIAL HX: reviewed.    Current Outpatient Medications:  .  amLODipine (NORVASC) 10 MG tablet, TAKE 1 TABLET BY MOUTH  DAILY, Disp: 90 tablet, Rfl: 1 .  BAYER MICROLET LANCETS lancets, USE AS DIRECTED TO CHECK  SUGARS 1 TO 2 TIMES A DAY, Disp: 300 each, Rfl: 2 .  cholecalciferol (VITAMIN D3) 25 MCG (1000 UT) tablet, Take 1,000 Units by mouth daily., Disp: , Rfl:  .  ezetimibe (ZETIA) 10 MG tablet, TAKE 1 TABLET BY MOUTH  DAILY, Disp: 90 tablet, Rfl: 3 .  hydrochlorothiazide (HYDRODIURIL) 25 MG tablet, TAKE 1 TABLET BY MOUTH  DAILY, Disp: 90 tablet, Rfl: 3 .  Insulin Glargine (LANTUS SOLOSTAR) 100 UNIT/ML Solostar Pen, INJECT SUBCUTANEOUSLY 50  UNITS NIGHTLY, Disp: 45 mL, Rfl: 1 .  insulin  lispro (HUMALOG KWIKPEN) 100 UNIT/ML KwikPen, INJECT SUBCUTANEOUSLY 50  UNITS DAILY, Disp: 45 mL, Rfl: 1 .  Insulin Pen Needle 32G X 4 MM MISC, Use as directed with insulin, Disp: 200 each, Rfl: 3 .  levocetirizine (XYZAL) 5 MG tablet, TAKE 1 TABLET BY MOUTH  EVERY EVENING, Disp: 90 tablet, Rfl: 0 .  lisinopril (ZESTRIL) 40 MG tablet, TAKE 1 TABLET BY MOUTH  DAILY, Disp: 90 tablet, Rfl: 2 .  ONETOUCH VERIO test strip, USE TO CHECK BLOOD GLUCOSE  6 TO 7 TIMES DAILY, Disp: 700 strip, Rfl: 3 .  sertraline (ZOLOFT) 50 MG tablet, TAKE 1 TABLET BY MOUTH  DAILY, Disp: 90 tablet, Rfl: 3 .  meclizine (ANTIVERT) 12.5 MG tablet, Take 1 tablet (12.5 mg total) by mouth 2 (two) times daily as needed for dizziness., Disp: 30  tablet, Rfl: 0 .  ondansetron (ZOFRAN ODT) 4 MG disintegrating tablet, Take 1 tablet (4 mg total) by mouth 2 (two) times daily as needed for nausea or vomiting., Disp: 20 tablet, Rfl: 0  EXAM:  VITALS per patient if applicable: 213/08  GENERAL: alert, oriented, appears well and in no acute distress  HEENT: atraumatic, conjunttiva clear, no obvious abnormalities on inspection of external nose and ears  NECK: normal movements of the head and neck  LUNGS: on inspection no signs of respiratory distress, breathing rate appears normal, no obvious gross SOB, gasping or wheezing  CV: no obvious cyanosis  PSYCH/NEURO: pleasant and cooperative, no obvious depression or anxiety, speech and thought processing grossly intact  ASSESSMENT AND PLAN:  Discussed the following assessment and plan:  Hypercholesterolemia Did not tolerate statin medication.  On zetia.  Follow lipid panel.  Low cholesterol diet and exercise.  Sent [facility] Lipid diet.   Anxiety On zoloft.  Doing better.  Follow.    B12 deficiency B12 level just checked and improved.  Change to oral B12 - 1052mg q day.   Dizziness Has a history of vertigo.  Just evaluated by ENT a few days ago.  No problems reported today.   Essential hypertension Blood pressure as outlined.  Continue current medication regimen.  Follow pressures.  Follow metabolic panel.   Type 1 diabetes mellitus Low carb diet and exercise.  Sugars as outlined.  a1c improved.  Continue low carb diet and exercise.  Follow met b and a1c.   Vitamin D deficiency Improved.  Continue vitamin D supplements.    History of colonic polyps Colonoscopy 12/2013 - hyperplastic polyp.  Recommended f/u colonoscopy in 12/2018.  See if agreeable to schedule.      I discussed the assessment and treatment plan with the patient. The patient was provided an opportunity to ask questions and all were answered. The patient agreed with the plan and demonstrated an understanding of the  instructions.   The patient was advised to call back or seek an in-person evaluation if the symptoms worsen or if the condition fails to improve as anticipated.   CEinar Pheasant MD

## 2019-11-02 ENCOUNTER — Other Ambulatory Visit: Payer: Self-pay | Admitting: Internal Medicine

## 2019-11-02 DIAGNOSIS — N6320 Unspecified lump in the left breast, unspecified quadrant: Secondary | ICD-10-CM

## 2019-11-03 ENCOUNTER — Telehealth: Payer: Self-pay | Admitting: Internal Medicine

## 2019-11-03 NOTE — Telephone Encounter (Signed)
Lm to schedule the following appts Return in about 4 months (around 03/01/2020) for physical after 03/06/20.  schedule fasting labs 1-2 days before physical

## 2019-11-06 ENCOUNTER — Encounter: Payer: Self-pay | Admitting: Internal Medicine

## 2019-11-06 ENCOUNTER — Telehealth: Payer: Self-pay | Admitting: Internal Medicine

## 2019-11-06 NOTE — Assessment & Plan Note (Signed)
B12 level just checked and improved.  Change to oral B12 - 1039mcg q day.

## 2019-11-06 NOTE — Assessment & Plan Note (Signed)
Blood pressure as outlined.  Continue current medication regimen.  Follow pressures.  Follow metabolic panel.  

## 2019-11-06 NOTE — Assessment & Plan Note (Signed)
Low carb diet and exercise.  Sugars as outlined.  a1c improved.  Continue low carb diet and exercise.  Follow met b and a1c.

## 2019-11-06 NOTE — Assessment & Plan Note (Signed)
On zoloft.  Doing better.  Follow.   

## 2019-11-06 NOTE — Assessment & Plan Note (Signed)
Has a history of vertigo.  Just evaluated by ENT a few days ago.  No problems reported today.

## 2019-11-06 NOTE — Assessment & Plan Note (Addendum)
Did not tolerate statin medication.  On zetia.  Follow lipid panel.  Low cholesterol diet and exercise.  Sent [facility] Lip[facility] diet.

## 2019-11-06 NOTE — Assessment & Plan Note (Signed)
Colonoscopy 12/2013 - hyperplastic polyp.  Recommended f/u colonoscopy in 12/2018.  See if agreeable to schedule.

## 2019-11-06 NOTE — Telephone Encounter (Signed)
Per note, pt last colonoscopy 12/2013.  Due 12/2018.  Previously saw Dr Tiffany Kocher.  He has retired.  If agreeable, will get her referred back for f/u colonoscopy.  If she wants to stay at [facility] - will schedule with Dr Alice Reichert.  Let me know and I will place order.

## 2019-11-06 NOTE — Assessment & Plan Note (Signed)
Improved.  Continue vitamin D supplements.

## 2019-11-07 NOTE — Telephone Encounter (Signed)
Patient stated that she would like to consult with her sister and then let me know where she would like to be referred. She is going to call back.

## 2019-11-18 ENCOUNTER — Other Ambulatory Visit: Payer: Self-pay | Admitting: Internal Medicine

## 2019-12-07 ENCOUNTER — Other Ambulatory Visit: Payer: Self-pay | Admitting: Internal Medicine

## 2019-12-21 ENCOUNTER — Other Ambulatory Visit: Payer: Self-pay | Admitting: Internal Medicine

## 2020-01-21 ENCOUNTER — Other Ambulatory Visit: Payer: Self-pay | Admitting: Internal Medicine

## 2020-03-05 ENCOUNTER — Telehealth: Payer: Self-pay | Admitting: Internal Medicine

## 2020-03-05 DIAGNOSIS — I10 Essential (primary) hypertension: Secondary | ICD-10-CM

## 2020-03-05 DIAGNOSIS — E109 Type 1 diabetes mellitus without complications: Secondary | ICD-10-CM

## 2020-03-05 DIAGNOSIS — E7800 Pure hypercholesterolemia, unspecified: Secondary | ICD-10-CM

## 2020-03-05 NOTE — Telephone Encounter (Signed)
Pt would like labs done @ [facility] before her 04/05/20 appt and will need orders sent to them.

## 2020-03-12 NOTE — Addendum Note (Signed)
Addended by: Larry Sierras on: 03/12/2020 10:45 AM   Modules accepted: Orders

## 2020-03-12 NOTE — Telephone Encounter (Signed)
Labs reordered for [facility]  

## 2020-03-27 ENCOUNTER — Ambulatory Visit
Admission: RE | Admit: 2020-03-27 | Discharge: 2020-03-27 | Disposition: A | Discharge status: Home / Self Care | Payer: Managed Care, Other (non HMO) | Source: Ambulatory Visit | Attending: Internal Medicine | Admitting: Internal Medicine

## 2020-03-27 ENCOUNTER — Other Ambulatory Visit: Payer: Self-pay | Admitting: Internal Medicine

## 2020-03-27 DIAGNOSIS — N6320 Unspecified lump in the left breast, unspecified quadrant: Secondary | ICD-10-CM | POA: Diagnosis not present

## 2020-03-27 LAB — CBC WITH DIFFERENTIAL/PLATELET
Basophils Absolute: 0.1 10*3/uL (ref 0.0–0.2)
Basos: 1 %
EOS (ABSOLUTE): 0.4 10*3/uL (ref 0.0–0.4)
Eos: 3 %
Hematocrit: 41.6 % (ref 34.0–46.6)
Hemoglobin: 14 g/dL (ref 11.1–15.9)
Immature Grans (Abs): 0.1 10*3/uL (ref 0.0–0.1)
Immature Granulocytes: 1 %
Lymphocytes Absolute: 2.5 10*3/uL (ref 0.7–3.1)
Lymphs: 24 %
MCH: 31 pg (ref 26.6–33.0)
MCHC: 33.7 g/dL (ref 31.5–35.7)
MCV: 92 fL (ref 79–97)
Monocytes Absolute: 0.7 10*3/uL (ref 0.1–0.9)
Monocytes: 6 %
Neutrophils Absolute: 6.9 10*3/uL (ref 1.4–7.0)
Neutrophils: 65 %
Platelets: 262 10*3/uL (ref 150–450)
RBC: 4.52 x10E6/uL (ref 3.77–5.28)
RDW: 13.3 % (ref 11.7–15.4)
WBC: 10.5 10*3/uL (ref 3.4–10.8)

## 2020-03-27 LAB — LIPID PANEL
Chol/HDL Ratio: 4.7 ratio — ABNORMAL HIGH (ref 0.0–4.4)
Cholesterol, Total: 237 mg/dL — ABNORMAL HIGH (ref 100–199)
HDL: 50 mg/dL (ref >39)
LDL Chol Calc (NIH): 142 mg/dL — ABNORMAL HIGH (ref 0–99)
Triglycerides: 250 mg/dL — ABNORMAL HIGH (ref 0–149)
VLDL Cholesterol Cal: 45 mg/dL — ABNORMAL HIGH (ref 5–40)

## 2020-03-27 LAB — HEPATIC FUNCTION PANEL
ALT: 18 IU/L (ref 0–32)
AST: 18 IU/L (ref 0–40)
Albumin: 4.5 g/dL (ref 3.8–4.8)
Alkaline Phosphatase: 64 IU/L (ref 39–117)
Bilirubin Total: 0.3 mg/dL (ref 0.0–1.2)
Bilirubin, Direct: 0.09 mg/dL (ref 0.00–0.40)
Total Protein: 7.1 g/dL (ref 6.0–8.5)

## 2020-03-27 LAB — HEMOGLOBIN A1C
Est. average glucose Bld gHb Est-mCnc: 157 mg/dL
Hgb A1c MFr Bld: 7.1 % — ABNORMAL HIGH (ref 4.8–5.6)

## 2020-03-27 LAB — BASIC METABOLIC PANEL
BUN/Creatinine Ratio: 10 — ABNORMAL LOW (ref 12–28)
BUN: 10 mg/dL (ref 8–27)
CO2: 25 mmol/L (ref 20–29)
Calcium: 10 mg/dL (ref 8.7–10.3)
Chloride: 98 mmol/L (ref 96–106)
Creatinine, Ser: 0.97 mg/dL (ref 0.57–1.00)
GFR calc Af Amer: 73 mL/min/{1.73_m2} (ref >59)
GFR calc non Af Amer: 63 mL/min/{1.73_m2} (ref >59)
Glucose: 253 mg/dL — ABNORMAL HIGH (ref 65–99)
Potassium: 4.7 mmol/L (ref 3.5–5.2)
Sodium: 137 mmol/L (ref 134–144)

## 2020-03-27 LAB — MICROALBUMIN / CREATININE URINE RATIO
Creatinine, Urine: 23.6 mg/dL
Microalb/Creat Ratio: 40 mg/g creat — ABNORMAL HIGH (ref 0–29)
Microalbumin, Urine: 9.5 ug/mL

## 2020-03-27 LAB — TSH: TSH: 1.98 u[IU]/mL (ref 0.450–4.500)

## 2020-03-29 NOTE — Telephone Encounter (Signed)
Patient aware of results and would like to proceed with a surgeon Dr. Lemar Livings to please schedule. Patient voiced understanding to results. Lab given. Patient taking Zetia daily.

## 2020-03-30 ENCOUNTER — Other Ambulatory Visit: Payer: Self-pay | Admitting: Internal Medicine

## 2020-03-30 DIAGNOSIS — R928 Other abnormal and inconclusive findings on diagnostic imaging of breast: Secondary | ICD-10-CM

## 2020-03-30 NOTE — Progress Notes (Signed)
Order placed for referral to Dr Byrnett.   

## 2020-04-05 ENCOUNTER — Telehealth (INDEPENDENT_AMBULATORY_CARE_PROVIDER_SITE_OTHER): Payer: Managed Care, Other (non HMO) | Admitting: Internal Medicine

## 2020-04-05 ENCOUNTER — Other Ambulatory Visit: Payer: Self-pay

## 2020-04-05 DIAGNOSIS — R928 Other abnormal and inconclusive findings on diagnostic imaging of breast: Secondary | ICD-10-CM

## 2020-04-05 DIAGNOSIS — E1065 Type 1 diabetes mellitus with hyperglycemia: Secondary | ICD-10-CM

## 2020-04-05 DIAGNOSIS — Z86010 Personal history of colonic polyps: Secondary | ICD-10-CM | POA: Diagnosis not present

## 2020-04-05 DIAGNOSIS — E7800 Pure hypercholesterolemia, unspecified: Secondary | ICD-10-CM | POA: Diagnosis not present

## 2020-04-05 DIAGNOSIS — E538 Deficiency of other specified B group vitamins: Secondary | ICD-10-CM

## 2020-04-05 DIAGNOSIS — I10 Essential (primary) hypertension: Secondary | ICD-10-CM

## 2020-04-05 NOTE — Progress Notes (Signed)
Patient ID: Jill Crosby, female   DOB: [birth year], 59 y.o.   MRN: [id number]   Virtual Visit via telephone Note  This visit type was conducted due to national recommendations for restrictions regarding the COVID-19 pandemic (e.g. social distancing).  This format is felt to be most appropriate for this patient at this time.  All issues noted in this document were discussed and addressed.  No physical exam was performed (except for noted visual exam findings with Video Visits).   I connected with Michael Boston  by telephone and verified that I am speaking with the correct person using two identifiers. Location patient: home Location provider: work Persons participating in the telephone visit: patient, provider  The limitations, risks, security and privacy concerns of performing an evaluation and management service by telephone and the availability of in person appointments have been discussed.  It has also been discussed with the patient that there may be a patient responsible charge related to this service. The patient has expressed understanding and has agreed to proceed.   Reason for visit: scheduled follow up.    HPI: Scheduled for f/u of her diabetes, hypercholesterolemia and hypertension.  Reports she is doing relatively well.  Still working from home.  This is going well.  Trying to watch her diet (some).  Not exercising as much.  Plans to start getting in her pool.  No chest pain or sob reported.  No abdominal pain reported.  No bowel change reported.  Plans to get more diligent with checking her sugars.  Discussed labs.  a1c 7.1.  Discussed colonoscopy.  She wants to hold.  Has f/u with Dr Bary Castilla next week - f/u abnormal mammogram.      ROS: See pertinent positives and negatives per HPI.  Past Medical History:  Diagnosis Date  . Anxiety   . Depression   . Elevated BP   . Hypercholesterolemia   . Increased BMI   . Menopause   . Type I diabetes mellitus ([place])     Past  Surgical History:  Procedure Laterality Date  . CESAREAN SECTION  1997    Family History  Problem Relation Age of Onset  . Hypertension Mother   . Hyperlipidemia Mother   . Colon polyps Mother   . Cancer Father        lung  . Colon cancer Maternal Grandfather   . Diabetes Maternal Grandfather   . Breast cancer Maternal Grandmother   . Colon cancer Paternal Grandfather   . Breast cancer Maternal Aunt   . Heart disease Neg Hx   . Ovarian cancer Neg Hx     SOCIAL HX: reviewed.    Current Outpatient Medications:  .  amLODipine (NORVASC) 10 MG tablet, TAKE 1 TABLET BY MOUTH  DAILY, Disp: 90 tablet, Rfl: 3 .  BAYER MICROLET LANCETS lancets, USE AS DIRECTED TO CHECK  SUGARS 1 TO 2 TIMES A DAY, Disp: 300 each, Rfl: 2 .  cholecalciferol (VITAMIN D3) 25 MCG (1000 UT) tablet, Take 1,000 Units by mouth daily., Disp: , Rfl:  .  ezetimibe (ZETIA) 10 MG tablet, TAKE 1 TABLET BY MOUTH  DAILY, Disp: 90 tablet, Rfl: 3 .  hydrochlorothiazide (HYDRODIURIL) 25 MG tablet, TAKE 1 TABLET BY MOUTH  DAILY, Disp: 90 tablet, Rfl: 3 .  insulin lispro (HUMALOG KWIKPEN) 100 UNIT/ML KwikPen, INJECT SUBCUTANEOUSLY 50  UNITS DAILY, Disp: 45 mL, Rfl: 3 .  Insulin Pen Needle 32G X 4 MM MISC, Use as directed with insulin, Disp: 200 each, Rfl:  3 .  LANTUS SOLOSTAR 100 UNIT/ML Solostar Pen, INJECT SUBCUTANEOUSLY 50  UNITS NIGHTLY, Disp: 45 mL, Rfl: 3 .  levocetirizine (XYZAL) 5 MG tablet, TAKE 1 TABLET BY MOUTH IN  THE EVENING, Disp: 90 tablet, Rfl: 0 .  lisinopril (ZESTRIL) 40 MG tablet, TAKE 1 TABLET BY MOUTH  DAILY, Disp: 90 tablet, Rfl: 3 .  meclizine (ANTIVERT) 12.5 MG tablet, Take 1 tablet (12.5 mg total) by mouth 2 (two) times daily as needed for dizziness., Disp: 30 tablet, Rfl: 0 .  ondansetron (ZOFRAN ODT) 4 MG disintegrating tablet, Take 1 tablet (4 mg total) by mouth 2 (two) times daily as needed for nausea or vomiting., Disp: 20 tablet, Rfl: 0 .  ONETOUCH VERIO test strip, USE TO CHECK BLOOD GLUCOSE  6 TO  7 TIMES DAILY, Disp: 700 strip, Rfl: 3 .  sertraline (ZOLOFT) 50 MG tablet, TAKE 1 TABLET BY MOUTH  DAILY, Disp: 90 tablet, Rfl: 3  EXAM:  GENERAL: alert.  Answering questions appropriately.  Sounds to be in no acute distress.    PSYCH/NEURO: pleasant and cooperative, no obvious depression or anxiety, speech and thought processing grossly intact  ASSESSMENT AND PLAN:  Discussed the following assessment and plan:  Type 1 diabetes mellitus Low carb diet and exercise.  Plans to get more diligent in checking sugars.  Plans to start exercising more.  On insulin.  Follow met b and a1c.    Hypercholesterolemia Did not tolerate statin medication.  On zetia.  Low cholesterol diet and exercise.  Follow lipid panel.    History of colonic polyps Colonoscopy 12/2013 - hyperplastic polyp.  Recommended f/u colonoscopy in 12/2018.  Discussed.  Wants to hold.  Will notify me when agreeable.    Essential hypertension Blood pressures have been under reasonable control.  Have her spot check her pressure.  Continue amlodipine and lisinopril.  Follow pressures.  Follow metabolic panel.    B12 deficiency On oral B12.  Check B12 level with next labs.    Abnormal mammogram Recent Birads IV.  Due to see Dr Bary Castilla next week.  Further w/up pending his assessment.     I discussed the assessment and treatment plan with the patient. The patient was provided an opportunity to ask questions and all were answered. The patient agreed with the plan and demonstrated an understanding of the instructions.   The patient was advised to call back or seek an in-person evaluation if the symptoms worsen or if the condition fails to improve as anticipated.  I provided 23 minutes of non-face-to-face time during this encounter.   Einar Pheasant, MD

## 2020-04-05 NOTE — Progress Notes (Signed)
Patient ID: Jill Crosby, female   DOB: 1961-07-10, 59 y.o.   MRN: [id number]

## 2020-04-15 ENCOUNTER — Encounter: Payer: Self-pay | Admitting: Internal Medicine

## 2020-04-15 DIAGNOSIS — R928 Other abnormal and inconclusive findings on diagnostic imaging of breast: Secondary | ICD-10-CM | POA: Insufficient documentation

## 2020-04-15 NOTE — Assessment & Plan Note (Signed)
Did not tolerate statin medication.  On zetia.  Low cholesterol diet and exercise.  Follow lipid panel.  

## 2020-04-15 NOTE — Assessment & Plan Note (Signed)
Recent Birads IV.  Due to see Dr Lemar Livings next week.  Further w/up pending his assessment.

## 2020-04-15 NOTE — Assessment & Plan Note (Signed)
Colonoscopy 12/2013 - hyperplastic polyp.  Recommended f/u colonoscopy in 12/2018.  Discussed.  Wants to hold.  Will notify me when agreeable.

## 2020-04-15 NOTE — Assessment & Plan Note (Signed)
Blood pressures have been under reasonable control.  Have her spot check her pressure.  Continue amlodipine and lisinopril.  Follow pressures.  Follow metabolic panel.

## 2020-04-15 NOTE — Assessment & Plan Note (Signed)
Low carb diet and exercise.  Plans to get more diligent in checking sugars.  Plans to start exercising more.  On insulin.  Follow met b and a1c.

## 2020-04-15 NOTE — Assessment & Plan Note (Signed)
On oral B12.  Check B12 level with next labs.  ?

## 2020-04-23 LAB — HM DIABETES EYE EXAM

## 2020-04-24 ENCOUNTER — Other Ambulatory Visit: Payer: Self-pay | Admitting: General Surgery

## 2020-04-24 DIAGNOSIS — R928 Other abnormal and inconclusive findings on diagnostic imaging of breast: Secondary | ICD-10-CM

## 2020-05-09 ENCOUNTER — Ambulatory Visit
Admission: RE | Admit: 2020-05-09 | Discharge: 2020-05-09 | Disposition: A | Discharge status: Home / Self Care | Payer: Managed Care, Other (non HMO) | Source: Ambulatory Visit | Attending: General Surgery | Admitting: General Surgery

## 2020-05-09 DIAGNOSIS — R928 Other abnormal and inconclusive findings on diagnostic imaging of breast: Secondary | ICD-10-CM

## 2020-05-09 HISTORY — PX: BREAST BIOPSY: SHX20

## 2020-05-10 LAB — SURGICAL PATHOLOGY

## 2020-05-22 ENCOUNTER — Other Ambulatory Visit: Payer: Self-pay | Admitting: Internal Medicine

## 2020-05-24 ENCOUNTER — Other Ambulatory Visit: Payer: Self-pay | Admitting: Internal Medicine

## 2020-06-12 IMAGING — MG DIGITAL DIAGNOSTIC BILATERAL MAMMOGRAM WITH TOMO AND CAD
8 series · 8 of 24 positions shown · non-contrast
Comparison: Previous exam(s).

CLINICAL DATA: One year follow-up for a probably benign few mm
nodule in the left breast.

EXAM:
DIGITAL DIAGNOSTIC BILATERAL MAMMOGRAM WITH CAD AND TOMO

[L MLO synth-2D]
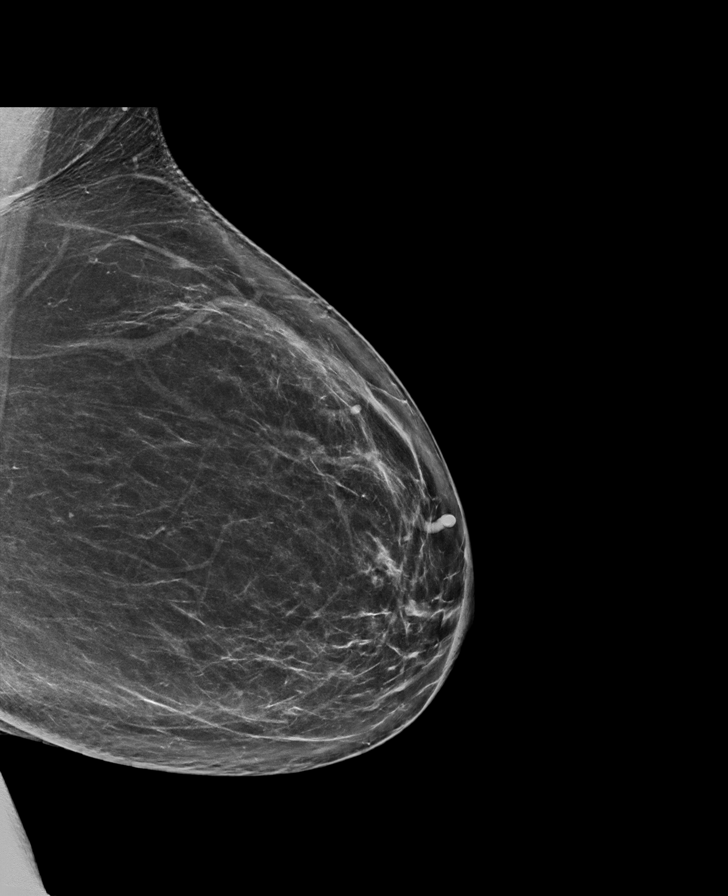

[R CC synth-2D]
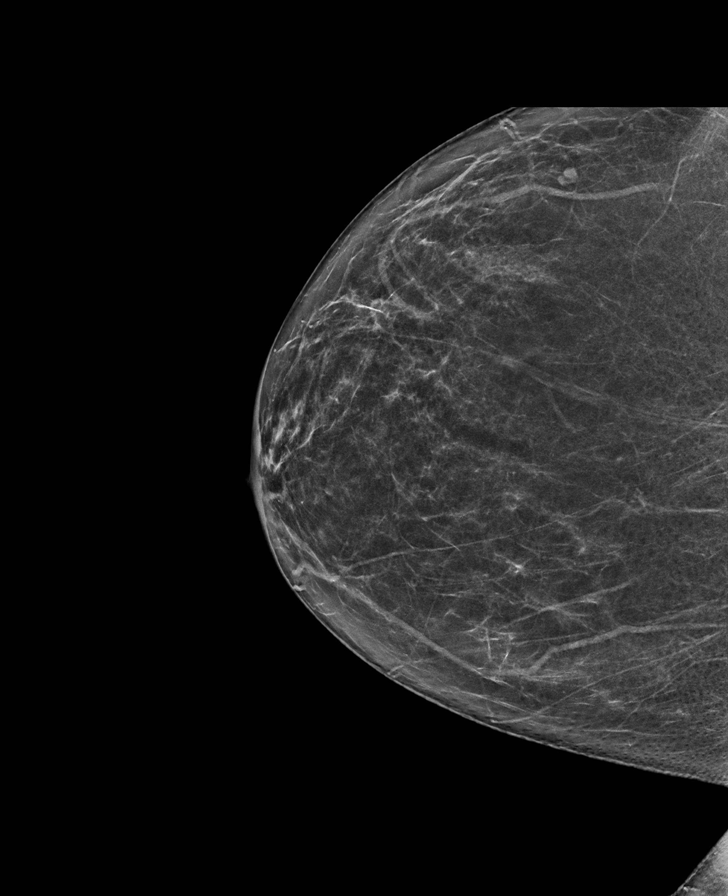

[L CC synth-2D]
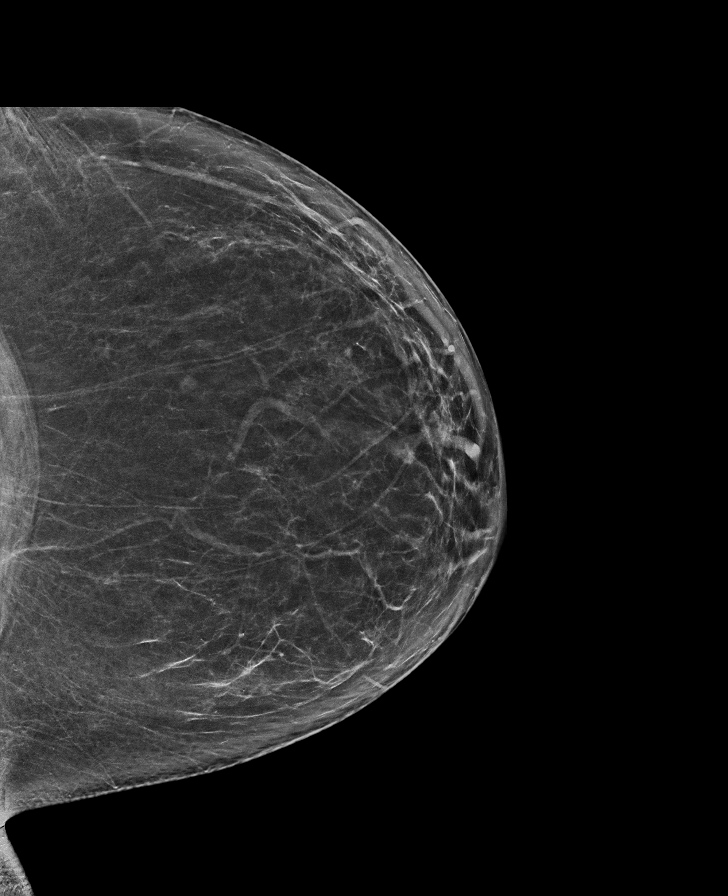

[R MLO synth-2D]
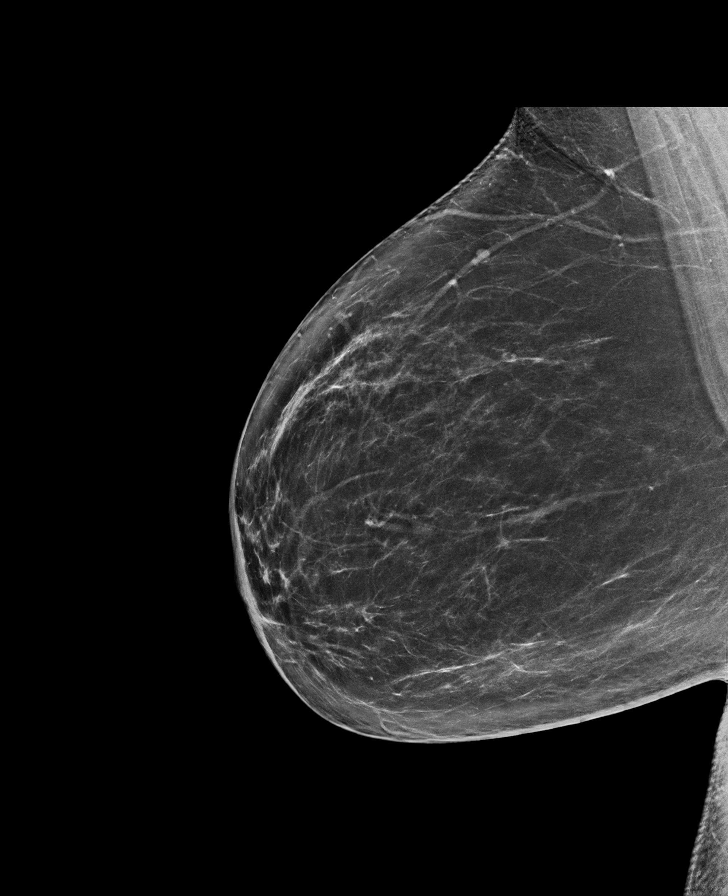

[L MLO tomo · tomo slice 43/85.0]
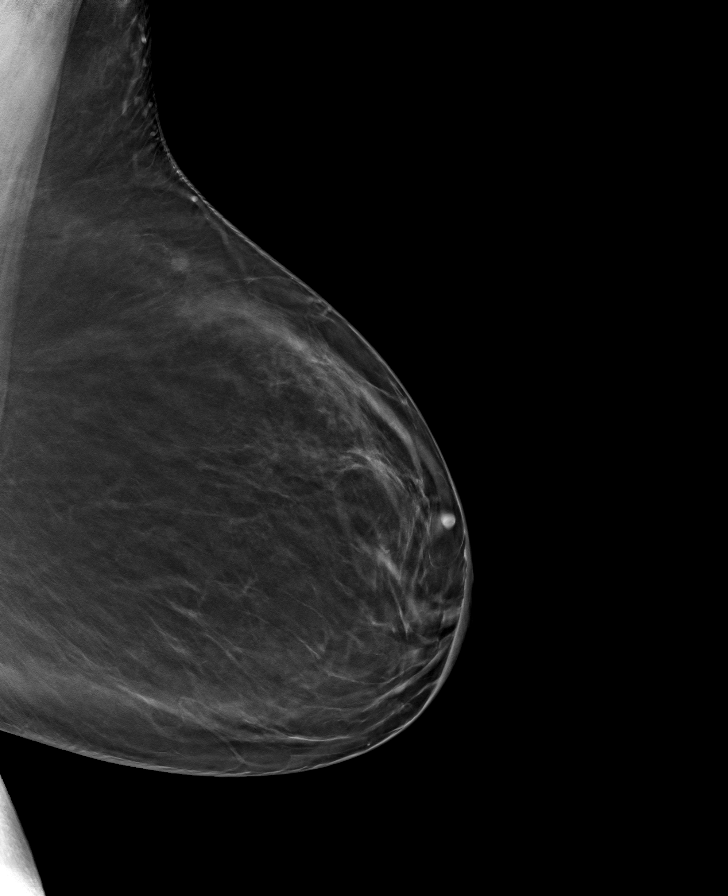

[R MLO tomo · tomo slice 43/86.0]
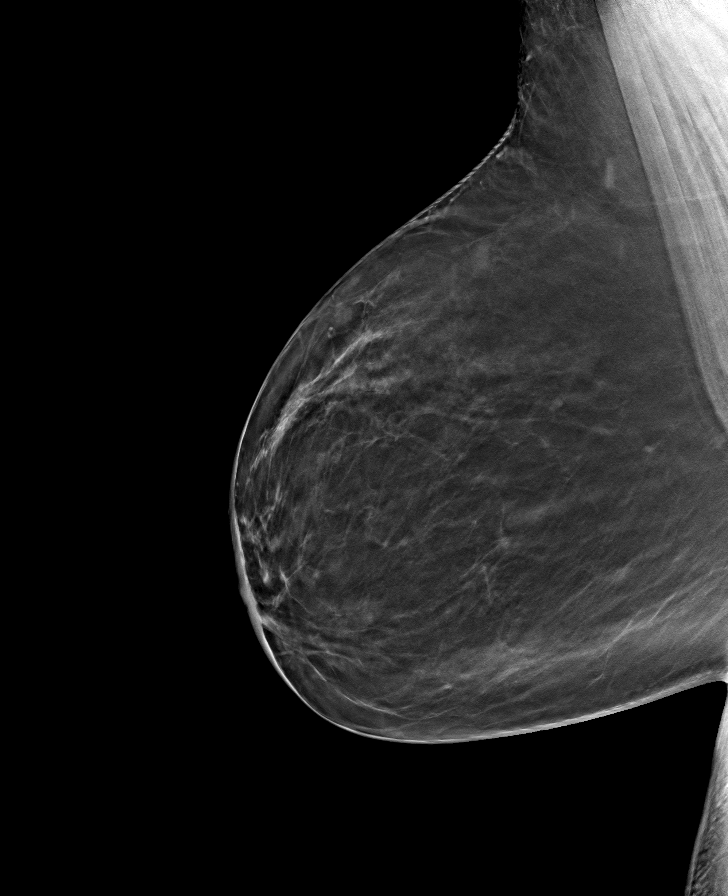

[L CC tomo · tomo slice 39/77.0]
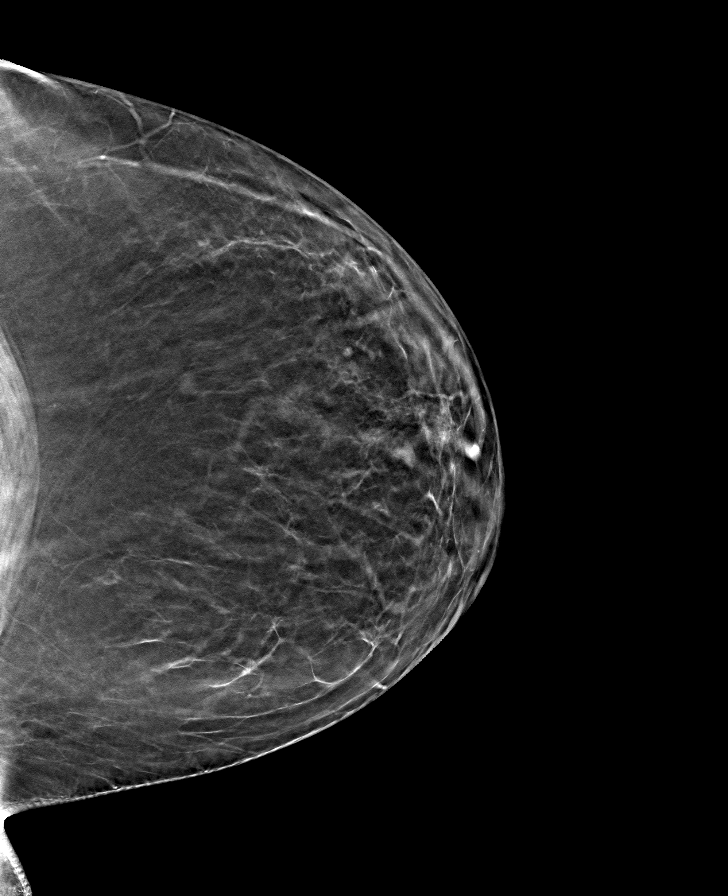

[R CC tomo · tomo slice 37/73.0]
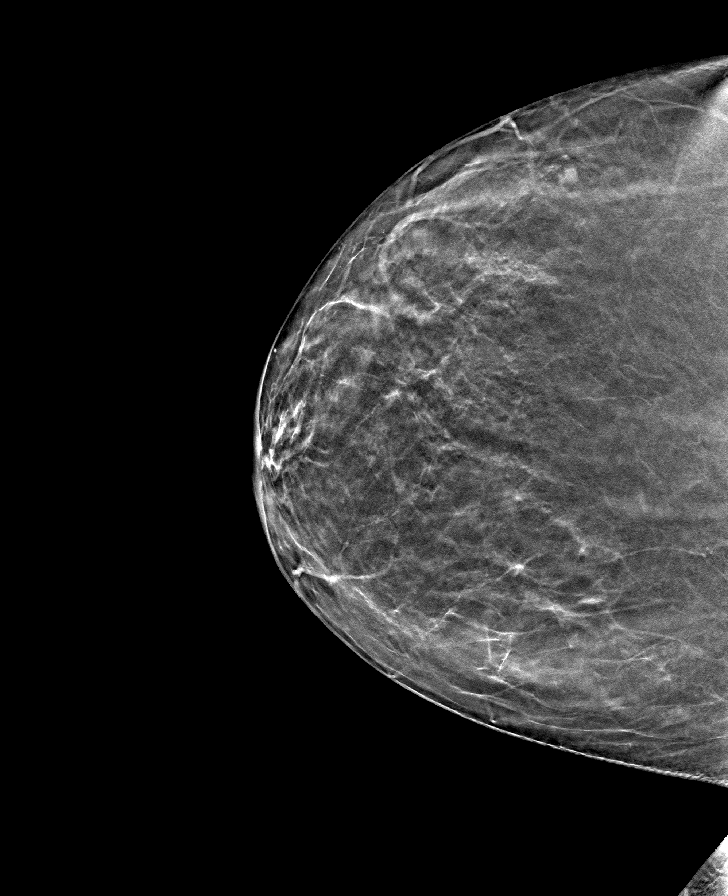

[8 of 24 positions shown; findings below may reference images not displayed]

ACR Breast Density Category b: There are scattered areas of
fibroglandular density.
FINDINGS: Mammographically, there are no suspicious masses, areas of
architectural distortion or microcalcifications either breast. The
previously noted 4 mm circumscribed likely fat containing
benign-appearing nodule in the slightly outer left breast, middle to
posterior depth, is stable.

Mammographic images were processed with CAD.
IMPRESSION: Stable probably benign 4 mm left lateral breast nodule, for which 1
year follow-up is recommended.

RECOMMENDATION:
Diagnostic mammogram is suggested in 1 year. (Code:94-H-Y6O)

I have discussed the findings and recommendations with the patient.
Results were also provided in writing at the conclusion of the
visit. If applicable, a reminder letter will be sent to the patient
regarding the next appointment.

BI-RADS CATEGORY  3: Probably benign.

## 2020-08-30 ENCOUNTER — Telehealth: Payer: Managed Care, Other (non HMO) | Admitting: Internal Medicine

## 2020-09-13 ENCOUNTER — Other Ambulatory Visit: Payer: Self-pay

## 2020-09-13 ENCOUNTER — Telehealth (INDEPENDENT_AMBULATORY_CARE_PROVIDER_SITE_OTHER): Payer: Managed Care, Other (non HMO) | Admitting: Internal Medicine

## 2020-09-13 VITALS — BP 146/78 | Ht 64.0 in | Wt 226.0 lb

## 2020-09-13 DIAGNOSIS — E7800 Pure hypercholesterolemia, unspecified: Secondary | ICD-10-CM

## 2020-09-13 DIAGNOSIS — E1065 Type 1 diabetes mellitus with hyperglycemia: Secondary | ICD-10-CM | POA: Diagnosis not present

## 2020-09-13 DIAGNOSIS — Z860100 Personal history of colon polyps, unspecified: Secondary | ICD-10-CM

## 2020-09-13 DIAGNOSIS — E559 Vitamin D deficiency, unspecified: Secondary | ICD-10-CM

## 2020-09-13 DIAGNOSIS — I10 Essential (primary) hypertension: Secondary | ICD-10-CM

## 2020-09-13 DIAGNOSIS — Z1211 Encounter for screening for malignant neoplasm of colon: Secondary | ICD-10-CM

## 2020-09-13 DIAGNOSIS — F419 Anxiety disorder, unspecified: Secondary | ICD-10-CM

## 2020-09-13 DIAGNOSIS — E538 Deficiency of other specified B group vitamins: Secondary | ICD-10-CM

## 2020-09-13 NOTE — Progress Notes (Signed)
Patient ID: Jill Crosby, female   DOB: 03-28-1961, 59 y.o.   MRN: [id number]   Virtual Visit via telephone Note  This visit type was conducted due to national recommendations for restrictions regarding the COVID-19 pandemic (e.g. social distancing).  This format is felt to be most appropriate for this patient at this time.  All issues noted in this document were discussed and addressed.  No physical exam was performed (except for noted visual exam findings with Video Visits).   I connected with Jill Crosby by telephone and verified that I am speaking with the correct person using two identifiers. Location patient: home Location provider: work Persons participating in the virtual visit: patient, provider  The limitations, risks, security and privacy concerns of performing an evaluation and management service by telephone and the availability of in person appointments have been discussed. . It has also been discussed with the patient that there may be a patient responsible charge related to this service. The patient expressed understanding and agreed to proceed.   Reason for visit: scheduled follow up.  HPI: Follow up appt - f/u regarding her blood pressure, cholesterol and sugars.  She feels she is doing relatively well.  Working from home and this is going well for her.  Not watching her diet as well.  Not exercising.  Blood sugars averaging 90-130 in am and 200 in pm.  Discussed diet and exercise.  No chest pain or sob.  No acid reflux.  No abdominal pain or bowel change.  S/p breast biopsy - ok.  Evaluated by Dr Bary Castilla.  Discussed vaccines.  Has quit smoking - stopped 06/15/20.     ROS: See pertinent positives and negatives per HPI.  Past Medical History:  Diagnosis Date  . Anxiety   . Depression   . Elevated BP   . Hypercholesterolemia   . Increased BMI   . Menopause   . Type I diabetes mellitus ([place])     Past Surgical History:  Procedure Laterality Date  . BREAST BIOPSY  Left 05/09/2020   ribbon clip, stereo bx, path pending  . CESAREAN SECTION  1997    Family History  Problem Relation Age of Onset  . Hypertension Mother   . Hyperlipidemia Mother   . Colon polyps Mother   . Cancer Father        lung  . Colon cancer Maternal Grandfather   . Diabetes Maternal Grandfather   . Breast cancer Maternal Grandmother   . Colon cancer Paternal Grandfather   . Breast cancer Maternal Aunt   . Heart disease Neg Hx   . Ovarian cancer Neg Hx     SOCIAL HX: reviewed   Current Outpatient Medications:  .  amLODipine (NORVASC) 10 MG tablet, TAKE 1 TABLET BY MOUTH  DAILY, Disp: 90 tablet, Rfl: 3 .  BAYER MICROLET LANCETS lancets, USE AS DIRECTED TO CHECK  SUGARS 1 TO 2 TIMES A DAY, Disp: 300 each, Rfl: 2 .  cholecalciferol (VITAMIN D3) 25 MCG (1000 UT) tablet, Take 1,000 Units by mouth daily., Disp: , Rfl:  .  ezetimibe (ZETIA) 10 MG tablet, TAKE 1 TABLET BY MOUTH  DAILY, Disp: 90 tablet, Rfl: 3 .  hydrochlorothiazide (HYDRODIURIL) 25 MG tablet, TAKE 1 TABLET BY MOUTH  DAILY, Disp: 90 tablet, Rfl: 3 .  insulin lispro (HUMALOG KWIKPEN) 100 UNIT/ML KwikPen, INJECT SUBCUTANEOUSLY 50  UNITS DAILY, Disp: 45 mL, Rfl: 3 .  Insulin Pen Needle 32G X 4 MM MISC, Use as directed with insulin, Disp:  200 each, Rfl: 3 .  LANTUS SOLOSTAR 100 UNIT/ML Solostar Pen, INJECT SUBCUTANEOUSLY 50  UNITS NIGHTLY, Disp: 45 mL, Rfl: 3 .  levocetirizine (XYZAL) 5 MG tablet, TAKE 1 TABLET BY MOUTH IN  THE EVENING, Disp: 90 tablet, Rfl: 0 .  lisinopril (ZESTRIL) 40 MG tablet, TAKE 1 TABLET BY MOUTH  DAILY, Disp: 90 tablet, Rfl: 3 .  meclizine (ANTIVERT) 12.5 MG tablet, Take 1 tablet (12.5 mg total) by mouth 2 (two) times daily as needed for dizziness., Disp: 30 tablet, Rfl: 0 .  ondansetron (ZOFRAN ODT) 4 MG disintegrating tablet, Take 1 tablet (4 mg total) by mouth 2 (two) times daily as needed for nausea or vomiting., Disp: 20 tablet, Rfl: 0 .  ONETOUCH VERIO test strip, USE TO CHECK BLOOD  GLUCOSE  6 TO 7 TIMES DAILY, Disp: 700 strip, Rfl: 3 .  sertraline (ZOLOFT) 50 MG tablet, TAKE 1 TABLET BY MOUTH  DAILY, Disp: 90 tablet, Rfl: 3  EXAM:  GENERAL: alert. Sounds to be in no acute distress.  Answering questions appropriately.   PSYCH/NEURO: pleasant and cooperative, no obvious depression or anxiety, speech and thought processing grossly intact  ASSESSMENT AND PLAN:  Discussed the following assessment and plan:  Problem List Items Addressed This Visit    Vitamin D deficiency    Follow vitamin D level.        Type 1 diabetes mellitus ([facility])    Sugars as outlined.  On insulin.  Discussed low carb diet and exercise.  Follow met b and a1c.  Up to date with eye checks.        Hypercholesterolemia    On zetia. Did not tolerate statin medication.  Low cholesterol diet and exercise.  Follow lipid panel.       History of colonic polyps    Colonoscopy 12/16/13 - hyperplastic polyp.  Recommend f/u colonoscopy in 12/2018.  Discussed with her today.  Agreeable for referral for colonoscopy.         Relevant Orders   Ambulatory referral to Gastroenterology   Essential hypertension    On amlodipine and lisinopril.  Spot check pressures.  Follow pressures and metabolic panel.  Continue current medication regimen.       B12 deficiency    On oral B12.  Check B12 level with next labs.       Anxiety    Working from home.  This is going well. On zoloft.  Doing well. Follow.         Other Visit Diagnoses    Colon cancer screening    -  Primary   Relevant Orders   Ambulatory referral to Gastroenterology       I discussed the assessment and treatment plan with the patient. The patient was provided an opportunity to ask questions and all were answered. The patient agreed with the plan and demonstrated an understanding of the instructions.   The patient was advised to call back or seek an in-person evaluation if the symptoms worsen or if the condition fails to improve as  anticipated.  I provided 25 minutes of non-face-to-face time during this encounter.   Einar Pheasant, MD

## 2020-09-17 ENCOUNTER — Encounter: Payer: Self-pay | Admitting: Internal Medicine

## 2020-09-17 NOTE — Assessment & Plan Note (Signed)
Follow vitamin D level.  

## 2020-09-17 NOTE — Assessment & Plan Note (Signed)
On amlodipine and lisinopril.  Spot check pressures.  Follow pressures and metabolic panel.  Continue current medication regimen.  

## 2020-09-17 NOTE — Assessment & Plan Note (Signed)
On oral B12.  Check B12 level with next labs.  ?

## 2020-09-17 NOTE — Assessment & Plan Note (Signed)
Sugars as outlined.  On insulin.  Discussed low carb diet and exercise.  Follow met b and a1c.  Up to date with eye checks.   

## 2020-09-17 NOTE — Assessment & Plan Note (Signed)
Working from home.  This is going well. On zoloft.  Doing well. Follow.

## 2020-09-17 NOTE — Assessment & Plan Note (Signed)
Colonoscopy 12/16/13 - hyperplastic polyp.  Recommend f/u colonoscopy in 12/2018.  Discussed with her today.  Agreeable for referral for colonoscopy.

## 2020-09-17 NOTE — Assessment & Plan Note (Signed)
On zetia. Did not tolerate statin medication.  Low cholesterol diet and exercise.  Follow lipid panel.  

## 2020-09-28 LAB — HM DIABETES EYE EXAM

## 2020-10-04 ENCOUNTER — Telehealth (INDEPENDENT_AMBULATORY_CARE_PROVIDER_SITE_OTHER): Payer: Self-pay | Admitting: Gastroenterology

## 2020-10-04 DIAGNOSIS — Z86010 Personal history of colonic polyps: Secondary | ICD-10-CM

## 2020-10-04 DIAGNOSIS — Z1211 Encounter for screening for malignant neoplasm of colon: Secondary | ICD-10-CM

## 2020-10-04 NOTE — Progress Notes (Signed)
Gastroenterology Pre-Procedure Review  Request Date: 12/07/20 Requesting Physician: Dr. Servando Snare  PATIENT REVIEW QUESTIONS: The patient responded to the following health history questions as indicated:    1. Are you having any GI issues? no 2. Do you have a personal history of Polyps? yes (12/16/13 colonoscopy performed by dr. Mechele Collin) 3. Do you have a family history of Colon Cancer or Polyps? yes (mother colon polyps) 4. Diabetes Mellitus? yes (type 1) 5. Joint replacements in the past 12 months?no 6. Major health problems in the past 3 months?no 7. Any artificial heart valves, MVP, or defibrillator?no    MEDICATIONS & ALLERGIES:    Patient reports the following regarding taking any anticoagulation/antiplatelet therapy:   Plavix, Coumadin, Eliquis, Xarelto, Lovenox, Pradaxa, Brilinta, or Effient? no Aspirin? no  Patient confirms/reports the following medications:  Current Outpatient Medications  Medication Sig Dispense Refill  . amLODipine (NORVASC) 10 MG tablet TAKE 1 TABLET BY MOUTH  DAILY 90 tablet 3  . BAYER MICROLET LANCETS lancets USE AS DIRECTED TO CHECK  SUGARS 1 TO 2 TIMES A DAY 300 each 2  . cholecalciferol (VITAMIN D3) 25 MCG (1000 UT) tablet Take 1,000 Units by mouth daily.    . Cyanocobalamin (B-12 PO) Take by mouth.    . ezetimibe (ZETIA) 10 MG tablet TAKE 1 TABLET BY MOUTH  DAILY 90 tablet 3  . hydrochlorothiazide (HYDRODIURIL) 25 MG tablet TAKE 1 TABLET BY MOUTH  DAILY 90 tablet 3  . insulin lispro (HUMALOG KWIKPEN) 100 UNIT/ML KwikPen INJECT SUBCUTANEOUSLY 50  UNITS DAILY 45 mL 3  . Insulin Pen Needle 32G X 4 MM MISC Use as directed with insulin 200 each 3  . LANTUS SOLOSTAR 100 UNIT/ML Solostar Pen INJECT SUBCUTANEOUSLY 50  UNITS NIGHTLY 45 mL 3  . levocetirizine (XYZAL) 5 MG tablet TAKE 1 TABLET BY MOUTH IN  THE EVENING 90 tablet 0  . lisinopril (ZESTRIL) 40 MG tablet TAKE 1 TABLET BY MOUTH  DAILY 90 tablet 3  . ONETOUCH VERIO test strip USE TO CHECK BLOOD GLUCOSE  6  TO 7 TIMES DAILY 700 strip 3  . sertraline (ZOLOFT) 50 MG tablet TAKE 1 TABLET BY MOUTH  DAILY 90 tablet 3  . meclizine (ANTIVERT) 12.5 MG tablet Take 1 tablet (12.5 mg total) by mouth 2 (two) times daily as needed for dizziness. (Patient not taking: Reported on 10/04/2020) 30 tablet 0  . ondansetron (ZOFRAN ODT) 4 MG disintegrating tablet Take 1 tablet (4 mg total) by mouth 2 (two) times daily as needed for nausea or vomiting. (Patient not taking: Reported on 10/04/2020) 20 tablet 0   No current facility-administered medications for this visit.    Patient confirms/reports the following allergies:  Allergies  Allergen Reactions  . Codeine     Orders Placed This Encounter  Procedures  . Procedural/ Surgical Case Request: COLONOSCOPY WITH PROPOFOL    Standing Status:   Standing    Number of Occurrences:   1    Order Specific Question:   Pre-op diagnosis    Answer:   screening colonoscopy, history of colon polyps    Order Specific Question:   CPT Code    Answer:   48270    AUTHORIZATION INFORMATION Primary Insurance: 1D#: Group #:  Secondary Insurance: 1D#: Group #:  SCHEDULE INFORMATION: Date: 12/07/20 Time: Location:[facility]

## 2020-10-05 ENCOUNTER — Other Ambulatory Visit: Payer: Self-pay

## 2020-10-05 DIAGNOSIS — Z1211 Encounter for screening for malignant neoplasm of colon: Secondary | ICD-10-CM

## 2020-10-05 DIAGNOSIS — Z86010 Personal history of colonic polyps: Secondary | ICD-10-CM

## 2020-10-10 ENCOUNTER — Other Ambulatory Visit: Payer: Self-pay | Admitting: Internal Medicine

## 2020-10-19 LAB — BASIC METABOLIC PANEL
BUN/Creatinine Ratio: 19 (ref 12–28)
BUN: 19 mg/dL (ref 8–27)
CO2: 21 mmol/L (ref 20–29)
Calcium: 9.9 mg/dL (ref 8.7–10.3)
Chloride: 97 mmol/L (ref 96–106)
Creatinine, Ser: 0.98 mg/dL (ref 0.57–1.00)
GFR calc Af Amer: 72 mL/min/{1.73_m2} (ref >59)
GFR calc non Af Amer: 62 mL/min/{1.73_m2} (ref >59)
Glucose: 104 mg/dL — ABNORMAL HIGH (ref 65–99)
Potassium: 4.2 mmol/L (ref 3.5–5.2)
Sodium: 136 mmol/L (ref 134–144)

## 2020-10-19 LAB — HEPATIC FUNCTION PANEL
ALT: 21 IU/L (ref 0–32)
AST: 15 IU/L (ref 0–40)
Albumin: 4.4 g/dL (ref 3.8–4.8)
Alkaline Phosphatase: 59 IU/L (ref 44–121)
Bilirubin Total: 0.3 mg/dL (ref 0.0–1.2)
Bilirubin, Direct: <0.1 mg/dL (ref 0.00–0.40)
Total Protein: 7.1 g/dL (ref 6.0–8.5)

## 2020-10-19 LAB — LIPID PANEL
Chol/HDL Ratio: 4.9 ratio — ABNORMAL HIGH (ref 0.0–4.4)
Cholesterol, Total: 230 mg/dL — ABNORMAL HIGH (ref 100–199)
HDL: 47 mg/dL (ref >39)
LDL Chol Calc (NIH): 138 mg/dL — ABNORMAL HIGH (ref 0–99)
Triglycerides: 249 mg/dL — ABNORMAL HIGH (ref 0–149)
VLDL Cholesterol Cal: 45 mg/dL — ABNORMAL HIGH (ref 5–40)

## 2020-10-19 LAB — VITAMIN B12: Vitamin B-12: 1042 pg/mL (ref 232–1245)

## 2020-10-19 LAB — HEMOGLOBIN A1C
Est. average glucose Bld gHb Est-mCnc: 166 mg/dL
Hgb A1c MFr Bld: 7.4 % — ABNORMAL HIGH (ref 4.8–5.6)

## 2020-10-29 ENCOUNTER — Other Ambulatory Visit: Payer: Self-pay | Admitting: Internal Medicine

## 2020-11-01 ENCOUNTER — Other Ambulatory Visit: Payer: Self-pay

## 2020-11-01 ENCOUNTER — Encounter: Payer: Self-pay | Admitting: Ophthalmology

## 2020-11-06 ENCOUNTER — Other Ambulatory Visit
Admission: RE | Admit: 2020-11-06 | Discharge: 2020-11-06 | Disposition: A | Discharge status: Home / Self Care | Payer: Managed Care, Other (non HMO) | Source: Ambulatory Visit | Attending: Ophthalmology | Admitting: Ophthalmology

## 2020-11-06 ENCOUNTER — Other Ambulatory Visit: Payer: Self-pay

## 2020-11-06 DIAGNOSIS — Z20822 Contact with and (suspected) exposure to covid-19: Secondary | ICD-10-CM | POA: Diagnosis not present

## 2020-11-06 DIAGNOSIS — Z01812 Encounter for preprocedural laboratory examination: Secondary | ICD-10-CM | POA: Insufficient documentation

## 2020-11-06 NOTE — Discharge Instructions (Signed)

## 2020-11-07 LAB — SARS CORONAVIRUS 2 (TAT 6-24 HRS): SARS Coronavirus 2: NEGATIVE

## 2020-11-08 ENCOUNTER — Other Ambulatory Visit: Payer: Self-pay

## 2020-11-08 ENCOUNTER — Ambulatory Visit: Payer: Managed Care, Other (non HMO) | Admitting: Anesthesiology

## 2020-11-08 ENCOUNTER — Encounter
Admission: RE | Disposition: A | Discharge status: Home / Self Care | Payer: Self-pay | Source: Home / Self Care | Attending: Ophthalmology

## 2020-11-08 ENCOUNTER — Encounter: Payer: Self-pay | Admitting: Ophthalmology

## 2020-11-08 ENCOUNTER — Ambulatory Visit
Admission: RE | Admit: 2020-11-08 | Discharge: 2020-11-08 | Disposition: A | Discharge status: Home / Self Care | Payer: Managed Care, Other (non HMO) | Attending: Ophthalmology | Admitting: Ophthalmology

## 2020-11-08 DIAGNOSIS — Z87891 Personal history of nicotine dependence: Secondary | ICD-10-CM | POA: Diagnosis not present

## 2020-11-08 DIAGNOSIS — Z794 Long term (current) use of insulin: Secondary | ICD-10-CM | POA: Insufficient documentation

## 2020-11-08 DIAGNOSIS — Z79899 Other long term (current) drug therapy: Secondary | ICD-10-CM | POA: Diagnosis not present

## 2020-11-08 DIAGNOSIS — Z885 Allergy status to narcotic agent status: Secondary | ICD-10-CM | POA: Insufficient documentation

## 2020-11-08 DIAGNOSIS — H2512 Age-related nuclear cataract, left eye: Secondary | ICD-10-CM | POA: Insufficient documentation

## 2020-11-08 HISTORY — PX: CATARACT EXTRACTION W/PHACO: SHX586

## 2020-11-08 LAB — GLUCOSE, CAPILLARY
Glucose-Capillary: 203 mg/dL — ABNORMAL HIGH (ref 70–99)
Glucose-Capillary: 192 mg/dL — ABNORMAL HIGH (ref 70–99)

## 2020-11-08 SURGERY — PHACOEMULSIFICATION, CATARACT, WITH IOL INSERTION
Anesthesia: Monitor Anesthesia Care | Site: Eye | Laterality: Left

## 2020-11-08 MED ORDER — ARMC OPHTHALMIC DILATING DROPS
1.0000 "application " | OPHTHALMIC | Status: DC | PRN
  Administered 2020-11-08 (×3): 1 via OPHTHALMIC

## 2020-11-08 MED ORDER — ACETAMINOPHEN 160 MG/5ML PO SOLN: 325.0000 mg | Freq: Once | ORAL | Status: DC

## 2020-11-08 MED ORDER — FENTANYL CITRATE (PF) 100 MCG/2ML IJ SOLN
INTRAMUSCULAR | Status: DC | PRN
  Administered 2020-11-08: 50 ug via INTRAVENOUS

## 2020-11-08 MED ORDER — BRIMONIDINE TARTRATE-TIMOLOL 0.2-0.5 % OP SOLN
OPHTHALMIC | Status: DC | PRN
  Administered 2020-11-08: 1 [drp] via OPHTHALMIC

## 2020-11-08 MED ORDER — EPINEPHRINE PF 1 MG/ML IJ SOLN
INTRAOCULAR | Status: DC | PRN
  Administered 2020-11-08: 74 mL via OPHTHALMIC

## 2020-11-08 MED ORDER — MIDAZOLAM HCL 2 MG/2ML IJ SOLN
INTRAMUSCULAR | Status: DC | PRN
  Administered 2020-11-08: 2 mg via INTRAVENOUS

## 2020-11-08 MED ORDER — TETRACAINE HCL 0.5 % OP SOLN
1.0000 [drp] | OPHTHALMIC | Status: DC | PRN
  Administered 2020-11-08 (×3): 1 [drp] via OPHTHALMIC

## 2020-11-08 MED ORDER — ACETAMINOPHEN 325 MG PO TABS: 325.0000 mg | ORAL_TABLET | Freq: Once | ORAL | Status: DC

## 2020-11-08 MED ORDER — LIDOCAINE HCL (PF) 2 % IJ SOLN
INTRAOCULAR | Status: DC | PRN
  Administered 2020-11-08: 1 mL

## 2020-11-08 MED ORDER — NA CHONDROIT SULF-NA HYALURON 40-17 MG/ML IO SOLN
INTRAOCULAR | Status: DC | PRN
  Administered 2020-11-08: 1 mL via INTRAOCULAR

## 2020-11-08 MED ORDER — MOXIFLOXACIN HCL 0.5 % OP SOLN
OPHTHALMIC | Status: DC | PRN
  Administered 2020-11-08: 0.2 mL via OPHTHALMIC

## 2020-11-08 SURGICAL SUPPLY — 19 items
CANNULA ANT/CHMB 27GA (MISCELLANEOUS) ×6 IMPLANT
GLOVE SURG LX 8.0 MICRO (GLOVE) ×2
GLOVE SURG LX STRL 8.0 MICRO (GLOVE) ×1 IMPLANT
GLOVE SURG TRIUMPH 8.0 PF LTX (GLOVE) ×3 IMPLANT
GOWN STRL REUS W/ TWL LRG LVL3 (GOWN DISPOSABLE) ×2 IMPLANT
GOWN STRL REUS W/TWL LRG LVL3 (GOWN DISPOSABLE) ×6
LENS IOL TECNIS EYHANCE 23.5 (Intraocular Lens) ×3 IMPLANT
MARKER SKIN DUAL TIP RULER LAB (MISCELLANEOUS) ×3 IMPLANT
NEEDLE FILTER BLUNT 18X 1/2SAF (NEEDLE) ×2
NEEDLE FILTER BLUNT 18X1 1/2 (NEEDLE) ×1 IMPLANT
PACK EYE AFTER SURG (MISCELLANEOUS) ×3 IMPLANT
PACK OPTHALMIC (MISCELLANEOUS) ×3 IMPLANT
PACK PORFILIO (MISCELLANEOUS) ×3 IMPLANT
SUT ETHILON 10-0 CS-B-6CS-B-6 (SUTURE)
SUTURE EHLN 10-0 CS-B-6CS-B-6 (SUTURE) IMPLANT
SYR 3ML LL SCALE MARK (SYRINGE) ×3 IMPLANT
SYR TB 1ML LUER SLIP (SYRINGE) ×3 IMPLANT
WATER STERILE IRR 250ML POUR (IV SOLUTION) ×3 IMPLANT
WIPE NON LINTING 3.25X3.25 (MISCELLANEOUS) ×3 IMPLANT

## 2020-11-08 NOTE — Op Note (Signed)
PREOPERATIVE DIAGNOSIS:  Nuclear sclerotic cataract of the left eye.   POSTOPERATIVE DIAGNOSIS:  Nuclear sclerotic cataract of the left eye.   OPERATIVE PROCEDURE:@   SURGEON:  Galen Manila, MD.   ANESTHESIA:  Anesthesiologist: Ranee Gosselin, MD CRNA: Jinny Blossom, CRNA  1.      Managed anesthesia care. 2.     0.51ml of Shugarcaine was instilled following the paracentesis   COMPLICATIONS:  None.   TECHNIQUE:   Stop and chop   DESCRIPTION OF PROCEDURE:  The patient was examined and consented in the preoperative holding area where the aforementioned topical anesthesia was applied to the left eye and then brought back to the Operating Room where the left eye was prepped and draped in the usual sterile ophthalmic fashion and a lid speculum was placed. A paracentesis was created with the side port blade and the anterior chamber was filled with viscoelastic. A near clear corneal incision was performed with the steel keratome. A continuous curvilinear capsulorrhexis was performed with a cystotome followed by the capsulorrhexis forceps. Hydrodissection and hydrodelineation were carried out with BSS on a blunt cannula. The lens was removed in a stop and chop  technique and the remaining cortical material was removed with the irrigation-aspiration handpiece. The capsular bag was inflated with viscoelastic and the Technis [id number] lens was placed in the capsular bag without complication. The remaining viscoelastic was removed from the eye with the irrigation-aspiration handpiece. The wounds were hydrated. The anterior chamber was flushed with BSS and the eye was inflated to physiologic pressure. 0.30ml Vigamox was placed in the anterior chamber. The wounds were found to be water tight. The eye was dressed with Combigan. The patient was given protective glasses to wear throughout the day and a shield with which to sleep tonight. The patient was also given drops with which to begin a drop regimen  today and will follow-up with me in one day. Implant Name Type Inv. Item Serial No. Manufacturer Lot No. LRB No. Used Action  LENS IOL TECNIS EYHANCE 23.5 - [id number] Intraocular Lens LENS IOL TECNIS EYHANCE 23.5 [id number] JOHNSON   Left 1 Implanted    Procedure(s): CATARACT EXTRACTION PHACO AND INTRAOCULAR LENS PLACEMENT (IOC) LEFT DIABETIC 4.14 00:52.1  (Left)  Electronically signed: Galen Manila 11/08/2020 9:58 AM

## 2020-11-08 NOTE — H&P (Signed)
[facility]   Primary Care Physician:  Dale Raymond, MD Ophthalmologist: Dr. Druscilla Brownie  Pre-Procedure History & Physical: HPI:  Jill Crosby is a 59 y.o. female here for cataract surgery.   Past Medical History:  Diagnosis Date  . Anxiety   . Depression   . Elevated BP   . Hypercholesterolemia   . Increased BMI   . Menopause   . Type I diabetes mellitus ([facility])     Past Surgical History:  Procedure Laterality Date  . BREAST BIOPSY Left 05/09/2020   ribbon clip, stereo bx, path pending  . CESAREAN SECTION  1997    Prior to Admission medications   Medication Sig Start Date End Date Taking? Authorizing Provider  amLODipine (NORVASC) 10 MG tablet TAKE 1 TABLET BY MOUTH  DAILY 10/30/20  Yes Dale [place], MD  cholecalciferol (VITAMIN D3) 25 MCG (1000 UT) tablet Take 1,000 Units by mouth daily.   Yes [provider]  Cyanocobalamin (B-12 PO) Take by mouth.   Yes [provider]  ezetimibe (ZETIA) 10 MG tablet TAKE 1 TABLET BY MOUTH  DAILY 05/23/20  Yes Dale [place], MD  hydrochlorothiazide (HYDRODIURIL) 25 MG tablet TAKE 1 TABLET BY MOUTH  DAILY 05/23/20  Yes Dale Gholson, MD  insulin glargine (LANTUS SOLOSTAR) 100 UNIT/ML Solostar Pen INJECT SUBCUTANEOUSLY 50  UNITS AT NIGHT 10/30/20  Yes Dale [place], MD  insulin lispro (HUMALOG KWIKPEN) 100 UNIT/ML KwikPen INJECT SUBCUTANEOUSLY 50  UNITS DAILY 10/11/20  Yes Dale [place], MD  lisinopril (ZESTRIL) 40 MG tablet TAKE 1 TABLET BY MOUTH  DAILY 01/23/20  Yes Dale Glenvil, MD  sertraline (ZOLOFT) 50 MG tablet TAKE 1 TABLET BY MOUTH  DAILY 05/23/20  Yes Dale [place], MD  BAYER MICROLET LANCETS lancets USE AS DIRECTED TO CHECK  SUGARS 1 TO 2 TIMES A DAY 01/15/18   Dale [place], MD  Insulin Pen Needle 32G X 4 MM MISC Use as directed with insulin 02/04/19   Dale [place], MD  levocetirizine (XYZAL) 5 MG tablet TAKE 1 TABLET BY MOUTH IN  THE EVENING 05/24/20   Dale [place], MD  meclizine  (ANTIVERT) 12.5 MG tablet Take 1 tablet (12.5 mg total) by mouth 2 (two) times daily as needed for dizziness. Patient not taking: Reported on 10/04/2020 11/01/19   Dale [place], MD  ondansetron (ZOFRAN ODT) 4 MG disintegrating tablet Take 1 tablet (4 mg total) by mouth 2 (two) times daily as needed for nausea or vomiting. Patient not taking: Reported on 10/04/2020 11/01/19   Dale [place], MD  [facility] VERIO test strip USE TO CHECK BLOOD GLUCOSE  6 TO 7 TIMES DAILY 09/07/19   Dale , MD    Allergies as of 10/02/2020 - Review Complete 09/17/2020  Allergen Reaction Noted  . Codeine  03/21/2013    Family History  Problem Relation Age of Onset  . Hypertension Mother   . Hyperlipidemia Mother   . Colon polyps Mother   . Cancer Father        lung  . Colon cancer Maternal Grandfather   . Diabetes Maternal Grandfather   . Breast cancer Maternal Grandmother   . Colon cancer Paternal Grandfather   . Breast cancer Maternal Aunt   . Heart disease Neg Hx   . Ovarian cancer Neg Hx     Social History   Socioeconomic History  . Marital status: Single    Spouse name: Not on file  . Number of children: Not on file  . Years of education: Not on file  .  Highest education level: Not on file  Occupational History  . Not on file  Tobacco Use  . Smoking status: Former Smoker    Packs/day: 0.50    Years: 20.00    Pack years: 10.00    Types: Cigarettes  . Smokeless tobacco: Never Used  Substance and Sexual Activity  . Alcohol use: No    Alcohol/week: 0.0 standard drinks  . Drug use: No  . Sexual activity: Not Currently    Birth control/protection: Post-menopausal  Other Topics Concern  . Not on file  Social History Narrative  . Not on file   Social Determinants of Health   Financial Resource Strain: Not on file  Food Insecurity: Not on file  Transportation Needs: Not on file  Physical Activity: Not on file  Stress: Not on file  Social Connections: Not on file  Intimate  Partner Violence: Not on file    Review of Systems: See HPI, otherwise negative ROS  Physical Exam: BP (!) 163/74   Pulse 83   Temp (!) 97.3 F (36.3 C) (Temporal)   Resp 18   Ht 5\' 4"  (1.626 m)   Wt 110.7 kg   SpO2 97%   BMI 41.88 kg/m  General:   Alert,  pleasant and cooperative in NAD Head:  Normocephalic and atraumatic. Respiratory:  Normal work of breathing. Heart:  Regular rate and rhythm.   Impression/Plan: is here for cataract surgery.  Risks, benefits, limitations, and alternatives regarding cataract surgery have been reviewed with the patient.  Questions have been answered.  All parties agreeable.   Irven Shelling, MD  11/08/2020, 9:30 AM

## 2020-11-08 NOTE — Anesthesia Procedure Notes (Signed)
Procedure Name: MAC Date/Time: 11/08/2020 9:40 AM Performed by: Cameron Ali, CRNA Pre-anesthesia Checklist: Patient identified, Emergency Drugs available, Suction available, Patient being monitored and Timeout performed Patient Re-evaluated:Patient Re-evaluated prior to induction Oxygen Delivery Method: Nasal cannula

## 2020-11-08 NOTE — Transfer of Care (Signed)
Immediate Anesthesia Transfer of Care Note  Patient: Jill Crosby  Procedure(s) Performed: CATARACT EXTRACTION PHACO AND INTRAOCULAR LENS PLACEMENT (IOC) LEFT DIABETIC 4.14 00:52.1  (Left Eye)  Patient Location: PACU  Anesthesia Type: MAC  Level of Consciousness: awake, alert  and patient cooperative  Airway and Oxygen Therapy: Patient Spontanous Breathing and Patient connected to supplemental oxygen  Post-op Assessment: Post-op Vital signs reviewed, Patient's Cardiovascular Status Stable, Respiratory Function Stable, Patent Airway and No signs of Nausea or vomiting  Post-op Vital Signs: Reviewed and stable  Complications: No complications documented.

## 2020-11-08 NOTE — Anesthesia Postprocedure Evaluation (Signed)
Anesthesia Post Note  Patient: Jill Crosby  Procedure(s) Performed: CATARACT EXTRACTION PHACO AND INTRAOCULAR LENS PLACEMENT (IOC) LEFT DIABETIC 4.14 00:52.1  (Left Eye)     Patient location during evaluation: PACU Anesthesia Type: MAC Level of consciousness: awake and alert and oriented Pain management: satisfactory to patient Vital Signs Assessment: post-procedure vital signs reviewed and stable Respiratory status: spontaneous breathing, nonlabored ventilation and respiratory function stable Cardiovascular status: blood pressure returned to baseline and stable Postop Assessment: Adequate PO intake and No signs of nausea or vomiting Anesthetic complications: no   No complications documented.  Raliegh Ip

## 2020-11-08 NOTE — Anesthesia Preprocedure Evaluation (Signed)
Anesthesia Evaluation  Patient identified by MRN, date of birth, ID band Patient awake    Reviewed: Allergy & Precautions, H&P , NPO status , Patient's Chart, lab work & pertinent test results  Airway Mallampati: II  TM Distance: >3 FB Neck ROM: full    Dental no notable dental hx.    Pulmonary former smoker,    Pulmonary exam normal breath sounds clear to auscultation       Cardiovascular hypertension, Normal cardiovascular exam Rhythm:regular Rate:Normal  Frequent mono-focal PVCs   Neuro/Psych PSYCHIATRIC DISORDERS    GI/Hepatic   Endo/Other  diabetesMorbid obesity  Renal/GU      Musculoskeletal   Abdominal   Peds  Hematology   Anesthesia Other Findings   Reproductive/Obstetrics                             Anesthesia Physical Anesthesia Plan  ASA: III  Anesthesia Plan: MAC   Post-op Pain Management:    Induction:   PONV Risk Score and Plan: 2 and Treatment may vary due to age or medical condition, TIVA and Midazolam  Airway Management Planned:   Additional Equipment:   Intra-op Plan:   Post-operative Plan:   Informed Consent: I have reviewed the patients History and Physical, chart, labs and discussed the procedure including the risks, benefits and alternatives for the proposed anesthesia with the patient or authorized representative who has indicated his/her understanding and acceptance.     Dental Advisory Given  Plan Discussed with: CRNA  Anesthesia Plan Comments:         Anesthesia Quick Evaluation

## 2020-11-09 ENCOUNTER — Telehealth: Payer: Self-pay | Admitting: Internal Medicine

## 2020-11-09 ENCOUNTER — Encounter: Payer: Self-pay | Admitting: Ophthalmology

## 2020-11-09 MED ORDER — ONETOUCH VERIO VI STRP
ORAL_STRIP | 3 refills | Status: DC
Start: 2020-11-09 — End: 2021-09-26

## 2020-11-09 MED ORDER — LEVOCETIRIZINE DIHYDROCHLORIDE 5 MG PO TABS
5.0000 mg | ORAL_TABLET | Freq: Every evening | ORAL | 0 refills | Status: DC
Start: 2020-11-09 — End: 2021-02-15

## 2020-11-09 NOTE — Telephone Encounter (Signed)
Patient called in for refill for levocetirizine (XYZAL) 5 MG tablet, and also contour next strip was suppose to be called

## 2020-11-12 ENCOUNTER — Other Ambulatory Visit: Payer: Self-pay

## 2020-11-12 ENCOUNTER — Telehealth: Payer: Self-pay | Admitting: Gastroenterology

## 2020-11-12 NOTE — Progress Notes (Signed)
Patient has requested to reschedule her procedure. New instructions will be mailed out.

## 2020-11-12 NOTE — Telephone Encounter (Signed)
Patient needs to resch colon with Dr. Servando Snare from 1.7.21 due to having a different procedure on 1.4.21. Please call pt back to resch. Pt had screening call on 11.4.21.

## 2020-11-12 NOTE — Telephone Encounter (Signed)
Procedure has been rescheduled. [facility] has been informed.

## 2020-11-21 ENCOUNTER — Other Ambulatory Visit: Payer: Self-pay

## 2020-11-21 ENCOUNTER — Encounter: Payer: Self-pay | Admitting: Ophthalmology

## 2020-11-22 ENCOUNTER — Encounter: Payer: Self-pay | Admitting: Ophthalmology

## 2020-11-29 NOTE — Discharge Instructions (Signed)

## 2020-11-30 ENCOUNTER — Other Ambulatory Visit
Admission: RE | Admit: 2020-11-30 | Discharge: 2020-11-30 | Disposition: A | Discharge status: Home / Self Care | Payer: Managed Care, Other (non HMO) | Source: Ambulatory Visit | Attending: Ophthalmology | Admitting: Ophthalmology

## 2020-11-30 ENCOUNTER — Other Ambulatory Visit: Payer: Self-pay

## 2020-11-30 DIAGNOSIS — Z01812 Encounter for preprocedural laboratory examination: Secondary | ICD-10-CM | POA: Diagnosis present

## 2020-11-30 DIAGNOSIS — Z20822 Contact with and (suspected) exposure to covid-19: Secondary | ICD-10-CM | POA: Insufficient documentation

## 2020-11-30 LAB — SARS CORONAVIRUS 2 (TAT 6-24 HRS): SARS Coronavirus 2: NEGATIVE

## 2020-12-04 ENCOUNTER — Ambulatory Visit
Admission: RE | Admit: 2020-12-04 | Discharge: 2020-12-04 | Disposition: A | Discharge status: Home / Self Care | Payer: Managed Care, Other (non HMO) | Attending: Ophthalmology | Admitting: Ophthalmology

## 2020-12-04 ENCOUNTER — Ambulatory Visit: Payer: Managed Care, Other (non HMO) | Admitting: Anesthesiology

## 2020-12-04 ENCOUNTER — Encounter
Admission: RE | Disposition: A | Discharge status: Home / Self Care | Payer: Self-pay | Source: Home / Self Care | Attending: Ophthalmology

## 2020-12-04 ENCOUNTER — Encounter: Payer: Self-pay | Admitting: Ophthalmology

## 2020-12-04 ENCOUNTER — Other Ambulatory Visit: Payer: Self-pay

## 2020-12-04 DIAGNOSIS — Z8371 Family history of colonic polyps: Secondary | ICD-10-CM | POA: Diagnosis not present

## 2020-12-04 DIAGNOSIS — Z801 Family history of malignant neoplasm of trachea, bronchus and lung: Secondary | ICD-10-CM | POA: Diagnosis not present

## 2020-12-04 DIAGNOSIS — Z961 Presence of intraocular lens: Secondary | ICD-10-CM | POA: Insufficient documentation

## 2020-12-04 DIAGNOSIS — Z794 Long term (current) use of insulin: Secondary | ICD-10-CM | POA: Diagnosis not present

## 2020-12-04 DIAGNOSIS — Z8249 Family history of ischemic heart disease and other diseases of the circulatory system: Secondary | ICD-10-CM | POA: Insufficient documentation

## 2020-12-04 DIAGNOSIS — E119 Type 2 diabetes mellitus without complications: Secondary | ICD-10-CM | POA: Insufficient documentation

## 2020-12-04 DIAGNOSIS — Z833 Family history of diabetes mellitus: Secondary | ICD-10-CM | POA: Insufficient documentation

## 2020-12-04 DIAGNOSIS — Z9842 Cataract extraction status, left eye: Secondary | ICD-10-CM | POA: Diagnosis not present

## 2020-12-04 DIAGNOSIS — H2511 Age-related nuclear cataract, right eye: Secondary | ICD-10-CM | POA: Insufficient documentation

## 2020-12-04 DIAGNOSIS — Z803 Family history of malignant neoplasm of breast: Secondary | ICD-10-CM | POA: Insufficient documentation

## 2020-12-04 DIAGNOSIS — Z885 Allergy status to narcotic agent status: Secondary | ICD-10-CM | POA: Insufficient documentation

## 2020-12-04 DIAGNOSIS — Z79899 Other long term (current) drug therapy: Secondary | ICD-10-CM | POA: Insufficient documentation

## 2020-12-04 HISTORY — PX: CATARACT EXTRACTION W/PHACO: SHX586

## 2020-12-04 LAB — GLUCOSE, CAPILLARY
Glucose-Capillary: 204 mg/dL — ABNORMAL HIGH (ref 70–99)
Glucose-Capillary: 219 mg/dL — ABNORMAL HIGH (ref 70–99)

## 2020-12-04 SURGERY — PHACOEMULSIFICATION, CATARACT, WITH IOL INSERTION
Anesthesia: Monitor Anesthesia Care | Site: Eye | Laterality: Right

## 2020-12-04 MED ORDER — MIDAZOLAM HCL 2 MG/2ML IJ SOLN
INTRAMUSCULAR | Status: DC | PRN
  Administered 2020-12-04: 2 mg via INTRAVENOUS

## 2020-12-04 MED ORDER — ACETAMINOPHEN 325 MG PO TABS: 325.0000 mg | ORAL_TABLET | ORAL | Status: DC | PRN

## 2020-12-04 MED ORDER — ACETAMINOPHEN 160 MG/5ML PO SOLN: 325.0000 mg | ORAL | Status: DC | PRN

## 2020-12-04 MED ORDER — ONDANSETRON HCL 4 MG/2ML IJ SOLN: 4.0000 mg | Freq: Once | INTRAMUSCULAR | Status: DC | PRN

## 2020-12-04 MED ORDER — NA CHONDROIT SULF-NA HYALURON 40-17 MG/ML IO SOLN
INTRAOCULAR | Status: DC | PRN
  Administered 2020-12-04: 1 mL via INTRAOCULAR

## 2020-12-04 MED ORDER — TETRACAINE HCL 0.5 % OP SOLN
1.0000 [drp] | OPHTHALMIC | Status: DC | PRN
  Administered 2020-12-04 (×3): 1 [drp] via OPHTHALMIC

## 2020-12-04 MED ORDER — FENTANYL CITRATE (PF) 100 MCG/2ML IJ SOLN
INTRAMUSCULAR | Status: DC | PRN
  Administered 2020-12-04 (×2): 50 ug via INTRAVENOUS

## 2020-12-04 MED ORDER — EPINEPHRINE PF 1 MG/ML IJ SOLN
INTRAOCULAR | Status: DC | PRN
  Administered 2020-12-04: 69 mL via OPHTHALMIC

## 2020-12-04 MED ORDER — ARMC OPHTHALMIC DILATING DROPS
1.0000 "application " | OPHTHALMIC | Status: DC | PRN
  Administered 2020-12-04 (×3): 1 via OPHTHALMIC

## 2020-12-04 MED ORDER — LIDOCAINE HCL (PF) 2 % IJ SOLN
INTRAOCULAR | Status: DC | PRN
  Administered 2020-12-04: 2 mL

## 2020-12-04 MED ORDER — BRIMONIDINE TARTRATE-TIMOLOL 0.2-0.5 % OP SOLN
OPHTHALMIC | Status: DC | PRN
  Administered 2020-12-04: 1 [drp] via OPHTHALMIC

## 2020-12-04 MED ORDER — MOXIFLOXACIN HCL 0.5 % OP SOLN
OPHTHALMIC | Status: DC | PRN
  Administered 2020-12-04: 0.2 mL via OPHTHALMIC

## 2020-12-04 SURGICAL SUPPLY — 17 items
CANNULA ANT/CHMB 27GA (MISCELLANEOUS) ×4 IMPLANT
GLOVE SURG LX 8.0 MICRO (GLOVE) ×1
GLOVE SURG LX STRL 8.0 MICRO (GLOVE) ×1 IMPLANT
GLOVE SURG TRIUMPH 8.0 PF LTX (GLOVE) ×2 IMPLANT
GOWN STRL REUS W/ TWL LRG LVL3 (GOWN DISPOSABLE) ×2 IMPLANT
GOWN STRL REUS W/TWL LRG LVL3 (GOWN DISPOSABLE) ×2
LENS IOL TECNIS EYHANCE 22.5 (Intraocular Lens) ×2 IMPLANT
MARKER SKIN DUAL TIP RULER LAB (MISCELLANEOUS) ×2 IMPLANT
NEEDLE FILTER BLUNT 18X 1/2SAF (NEEDLE) ×1
NEEDLE FILTER BLUNT 18X1 1/2 (NEEDLE) ×1 IMPLANT
PACK EYE AFTER SURG (MISCELLANEOUS) ×2 IMPLANT
PACK OPTHALMIC (MISCELLANEOUS) ×2 IMPLANT
PACK PORFILIO (MISCELLANEOUS) ×2 IMPLANT
SYR 3ML LL SCALE MARK (SYRINGE) ×2 IMPLANT
SYR TB 1ML LUER SLIP (SYRINGE) ×2 IMPLANT
WATER STERILE IRR 250ML POUR (IV SOLUTION) ×2 IMPLANT
WIPE NON LINTING 3.25X3.25 (MISCELLANEOUS) ×2 IMPLANT

## 2020-12-04 NOTE — H&P (Signed)
[facility]   Primary Care Physician:  Dale [place], MD Ophthalmologist: Dr. Druscilla Brownie  Pre-Procedure History & Physical: HPI:  Jill Crosby is a 60 y.o. female here for cataract surgery.   Past Medical History:  Diagnosis Date  . Anxiety   . Depression   . Elevated BP   . Hypercholesterolemia   . Increased BMI   . Menopause   . Type I diabetes mellitus (HCC)     Past Surgical History:  Procedure Laterality Date  . BREAST BIOPSY Left 05/09/2020   ribbon clip, stereo bx, path pending  . CATARACT EXTRACTION W/PHACO Left 11/08/2020   Procedure: CATARACT EXTRACTION PHACO AND INTRAOCULAR LENS PLACEMENT (IOC) LEFT DIABETIC 4.14 00:52.1 ;  Surgeon: Galen Manila, MD;  Location: [facility];  Service: Ophthalmology;  Laterality: Left;  . CESAREAN SECTION  1997    Prior to Admission medications   Medication Sig Start Date End Date Taking? Authorizing Provider  amLODipine (NORVASC) 10 MG tablet TAKE 1 TABLET BY MOUTH  DAILY 10/30/20  Yes Dale [place], MD  cholecalciferol (VITAMIN D3) 25 MCG (1000 UT) tablet Take 1,000 Units by mouth daily.   Yes [provider]  Cyanocobalamin (B-12 PO) Take by mouth.   Yes [provider]  ezetimibe (ZETIA) 10 MG tablet TAKE 1 TABLET BY MOUTH  DAILY 05/23/20  Yes Dale [place], MD  hydrochlorothiazide (HYDRODIURIL) 25 MG tablet TAKE 1 TABLET BY MOUTH  DAILY 05/23/20  Yes Dale [place], MD  insulin lispro (HUMALOG KWIKPEN) 100 UNIT/ML KwikPen INJECT SUBCUTANEOUSLY 50  UNITS DAILY 10/11/20  Yes Dale [place], MD  levocetirizine (XYZAL) 5 MG tablet Take 1 tablet (5 mg total) by mouth every evening. 11/09/20  Yes Dale [place], MD  lisinopril (ZESTRIL) 40 MG tablet TAKE 1 TABLET BY MOUTH  DAILY 01/23/20  Yes Dale [place], MD  sertraline (ZOLOFT) 50 MG tablet TAKE 1 TABLET BY MOUTH  DAILY 05/23/20  Yes Dale [place], MD  BAYER MICROLET LANCETS lancets USE AS DIRECTED TO CHECK  SUGARS 1 TO 2 TIMES A DAY  01/15/18   Dale [place], MD  glucose blood (ONETOUCH VERIO) test strip USE TO CHECK BLOOD GLUCOSE  6 TO 7 TIMES DAILY 11/09/20   Dale [place], MD  insulin glargine (LANTUS SOLOSTAR) 100 UNIT/ML Solostar Pen INJECT SUBCUTANEOUSLY 50  UNITS AT NIGHT 10/30/20   Dale [place], MD  Insulin Pen Needle 32G X 4 MM MISC Use as directed with insulin 02/04/19   Dale [place], MD  meclizine (ANTIVERT) 12.5 MG tablet Take 1 tablet (12.5 mg total) by mouth 2 (two) times daily as needed for dizziness. Patient not taking: Reported on 10/04/2020 11/01/19   Dale [place], MD  ondansetron (ZOFRAN ODT) 4 MG disintegrating tablet Take 1 tablet (4 mg total) by mouth 2 (two) times daily as needed for nausea or vomiting. Patient not taking: Reported on 10/04/2020 11/01/19   Dale [place], MD    Allergies as of 11/12/2020 - Review Complete 11/08/2020  Allergen Reaction Noted  . Codeine  03/21/2013    Family History  Problem Relation Age of Onset  . Hypertension Mother   . Hyperlipidemia Mother   . Colon polyps Mother   . Cancer Father        lung  . Colon cancer Maternal Grandfather   . Diabetes Maternal Grandfather   . Breast cancer Maternal Grandmother   . Colon cancer Paternal Grandfather   . Breast cancer Maternal Aunt   . Heart disease Neg Hx   . Ovarian  cancer Neg Hx     Social History   Socioeconomic History  . Marital status: Single    Spouse name: Not on file  . Number of children: Not on file  . Years of education: Not on file  . Highest education level: Not on file  Occupational History  . Not on file  Tobacco Use  . Smoking status: Former Smoker    Packs/day: 0.50    Years: 20.00    Pack years: 10.00    Types: Cigarettes  . Smokeless tobacco: Never Used  Substance and Sexual Activity  . Alcohol use: No    Alcohol/week: 0.0 standard drinks  . Drug use: No  . Sexual activity: Not Currently    Birth control/protection: Post-menopausal  Other Topics Concern  . Not on file   Social History Narrative  . Not on file   Social Determinants of Health   Financial Resource Strain: Not on file  Food Insecurity: Not on file  Transportation Needs: Not on file  Physical Activity: Not on file  Stress: Not on file  Social Connections: Not on file  Intimate Partner Violence: Not on file    Review of Systems: See HPI, otherwise negative ROS  Physical Exam: BP (!) 129/57   Pulse 85   Temp 97.8 F (36.6 C) (Temporal)   Resp 16   Ht 5\' 4"  (1.626 m)   Wt 110.2 kg   SpO2 98%   BMI 41.71 kg/m  General:   Alert,  pleasant and cooperative in NAD Head:  Normocephalic and atraumatic. Respiratory:  Normal work of breathing.  Impression/Plan: is here for cataract surgery.  Risks, benefits, limitations, and alternatives regarding cataract surgery have been reviewed with the patient.  Questions have been answered.  All parties agreeable.   Irven Shelling, MD  12/04/2020, 7:58 AM

## 2020-12-04 NOTE — Anesthesia Postprocedure Evaluation (Signed)
Anesthesia Post Note  Patient: Jill Crosby  Procedure(s) Performed: CATARACT EXTRACTION PHACO AND INTRAOCULAR LENS PLACEMENT (IOC) RIGHT DIABETIC 6.11 00:46.1 (Right Eye)     Patient location during evaluation: PACU Anesthesia Type: MAC Level of consciousness: awake and alert Pain management: pain level controlled Vital Signs Assessment: post-procedure vital signs reviewed and stable Respiratory status: spontaneous breathing, nonlabored ventilation, respiratory function stable and patient connected to nasal cannula oxygen Cardiovascular status: stable and blood pressure returned to baseline Postop Assessment: no apparent nausea or vomiting Anesthetic complications: no   No complications documented.  Alisa Graff

## 2020-12-04 NOTE — Anesthesia Preprocedure Evaluation (Addendum)
Anesthesia Evaluation  Patient identified by MRN, date of birth, ID band Patient awake    Reviewed: Allergy & Precautions, H&P , NPO status , Patient's Chart, lab work & pertinent test results, reviewed documented beta blocker date and time   Airway Mallampati: II  TM Distance: >3 FB Neck ROM: full    Dental no notable dental hx.    Pulmonary neg pulmonary ROS, former smoker,    Pulmonary exam normal breath sounds clear to auscultation       Cardiovascular Exercise Tolerance: Good hypertension,  Rhythm:regular Rate:Normal     Neuro/Psych Anxiety Depression negative neurological ROS     GI/Hepatic negative GI ROS, Neg liver ROS,   Endo/Other  negative endocrine ROSdiabetes, Insulin Dependent  Renal/GU negative Renal ROS  negative genitourinary   Musculoskeletal   Abdominal   Peds  Hematology negative hematology ROS (+)   Anesthesia Other Findings   Reproductive/Obstetrics negative OB ROS                             Anesthesia Physical Anesthesia Plan  ASA: III  Anesthesia Plan: MAC   Post-op Pain Management:    Induction:   PONV Risk Score and Plan: 2 and Treatment may vary due to age or medical condition  Airway Management Planned:   Additional Equipment:   Intra-op Plan:   Post-operative Plan:   Informed Consent: I have reviewed the patients History and Physical, chart, labs and discussed the procedure including the risks, benefits and alternatives for the proposed anesthesia with the patient or authorized representative who has indicated his/her understanding and acceptance.     Dental Advisory Given  Plan Discussed with: CRNA  Anesthesia Plan Comments:         Anesthesia Quick Evaluation

## 2020-12-04 NOTE — Transfer of Care (Signed)
Immediate Anesthesia Transfer of Care Note  Patient: Jill Crosby  Procedure(s) Performed: CATARACT EXTRACTION PHACO AND INTRAOCULAR LENS PLACEMENT (IOC) RIGHT DIABETIC 6.11 00:46.1 (Right Eye)  Patient Location: PACU  Anesthesia Type: MAC  Level of Consciousness: awake, alert  and patient cooperative  Airway and Oxygen Therapy: Patient Spontanous Breathing and Patient connected to supplemental oxygen  Post-op Assessment: Post-op Vital signs reviewed, Patient's Cardiovascular Status Stable, Respiratory Function Stable, Patent Airway and No signs of Nausea or vomiting  Post-op Vital Signs: Reviewed and stable  Complications: No complications documented.

## 2020-12-04 NOTE — Op Note (Signed)
PREOPERATIVE DIAGNOSIS:  Nuclear sclerotic cataract of the right eye.   POSTOPERATIVE DIAGNOSIS:  Cataract   OPERATIVE PROCEDURE:@   SURGEON:  Galen Manila, MD.   ANESTHESIA:  Anesthesiologist: Scarlette Slice, MD CRNA: Jinny Blossom, CRNA  1.      Managed anesthesia care. 2.      0.39ml of Shugarcaine was instilled in the eye following the paracentesis.   COMPLICATIONS:  None.   TECHNIQUE:   Stop and chop   DESCRIPTION OF PROCEDURE:  The patient was examined and consented in the preoperative holding area where the aforementioned topical anesthesia was applied to the right eye and then brought back to the Operating Room where the right eye was prepped and draped in the usual sterile ophthalmic fashion and a lid speculum was placed. A paracentesis was created with the side port blade and the anterior chamber was filled with viscoelastic. A near clear corneal incision was performed with the steel keratome. A continuous curvilinear capsulorrhexis was performed with a cystotome followed by the capsulorrhexis forceps. Hydrodissection and hydrodelineation were carried out with BSS on a blunt cannula. The lens was removed in a stop and chop  technique and the remaining cortical material was removed with the irrigation-aspiration handpiece. The capsular bag was inflated with viscoelastic and the Technis [id number]  lens was placed in the capsular bag without complication. The remaining viscoelastic was removed from the eye with the irrigation-aspiration handpiece. The wounds were hydrated. The anterior chamber was flushed with BSS and the eye was inflated to physiologic pressure. 0.69ml of Vigamox was placed in the anterior chamber. The wounds were found to be water tight. The eye was dressed with Combigan. The patient was given protective glasses to wear throughout the day and a shield with which to sleep tonight. The patient was also given drops with which to begin a drop regimen today and will  follow-up with me in one day. Implant Name Type Inv. Item Serial No. Manufacturer Lot No. LRB No. Used Action  LENS IOL TECNIS EYHANCE 22.5 - [id number] Intraocular Lens LENS IOL TECNIS EYHANCE 22.5 [id number] JOHNSON   Right 1 Implanted   Procedure(s) with comments: CATARACT EXTRACTION PHACO AND INTRAOCULAR LENS PLACEMENT (IOC) RIGHT DIABETIC 6.11 00:46.1 (Right) - Diabetic - insulin  Electronically signed: Galen Manila 12/04/2020 9:13 AM

## 2020-12-04 NOTE — Anesthesia Procedure Notes (Signed)
Procedure Name: MAC Date/Time: 12/04/2020 8:53 AM Performed by: Jeannene Patella, CRNA Pre-anesthesia Checklist: Patient identified, Emergency Drugs available, Suction available, Timeout performed and Patient being monitored Patient Re-evaluated:Patient Re-evaluated prior to induction Oxygen Delivery Method: Nasal cannula Placement Confirmation: positive ETCO2

## 2020-12-05 ENCOUNTER — Encounter: Payer: Self-pay | Admitting: Ophthalmology

## 2020-12-19 ENCOUNTER — Other Ambulatory Visit: Payer: Self-pay | Admitting: Internal Medicine

## 2021-01-01 ENCOUNTER — Encounter: Payer: Self-pay | Admitting: Gastroenterology

## 2021-01-16 ENCOUNTER — Other Ambulatory Visit
Admission: RE | Admit: 2021-01-16 | Discharge: 2021-01-16 | Disposition: A | Discharge status: Home / Self Care | Payer: Managed Care, Other (non HMO) | Source: Ambulatory Visit | Attending: Gastroenterology | Admitting: Gastroenterology

## 2021-01-16 ENCOUNTER — Other Ambulatory Visit: Payer: Self-pay

## 2021-01-16 DIAGNOSIS — Z20822 Contact with and (suspected) exposure to covid-19: Secondary | ICD-10-CM | POA: Diagnosis not present

## 2021-01-16 DIAGNOSIS — Z01812 Encounter for preprocedural laboratory examination: Secondary | ICD-10-CM | POA: Diagnosis not present

## 2021-01-17 LAB — SARS CORONAVIRUS 2 (TAT 6-24 HRS): SARS Coronavirus 2: NEGATIVE

## 2021-01-17 NOTE — Discharge Instructions (Signed)

## 2021-01-18 ENCOUNTER — Ambulatory Visit: Payer: Managed Care, Other (non HMO) | Admitting: Anesthesiology

## 2021-01-18 ENCOUNTER — Encounter: Payer: Self-pay | Admitting: Gastroenterology

## 2021-01-18 ENCOUNTER — Encounter
Admission: RE | Disposition: A | Discharge status: Home / Self Care | Payer: Self-pay | Source: Home / Self Care | Attending: Gastroenterology

## 2021-01-18 ENCOUNTER — Ambulatory Visit
Admission: RE | Admit: 2021-01-18 | Discharge: 2021-01-18 | Disposition: A | Discharge status: Home / Self Care | Payer: Managed Care, Other (non HMO) | Attending: Gastroenterology | Admitting: Gastroenterology

## 2021-01-18 ENCOUNTER — Other Ambulatory Visit: Payer: Self-pay

## 2021-01-18 DIAGNOSIS — Z860100 Personal history of colon polyps, unspecified: Secondary | ICD-10-CM

## 2021-01-18 DIAGNOSIS — Z885 Allergy status to narcotic agent status: Secondary | ICD-10-CM | POA: Diagnosis not present

## 2021-01-18 DIAGNOSIS — Z794 Long term (current) use of insulin: Secondary | ICD-10-CM | POA: Insufficient documentation

## 2021-01-18 DIAGNOSIS — Z1211 Encounter for screening for malignant neoplasm of colon: Secondary | ICD-10-CM | POA: Diagnosis present

## 2021-01-18 DIAGNOSIS — Z87891 Personal history of nicotine dependence: Secondary | ICD-10-CM | POA: Diagnosis not present

## 2021-01-18 DIAGNOSIS — Z79899 Other long term (current) drug therapy: Secondary | ICD-10-CM | POA: Diagnosis not present

## 2021-01-18 HISTORY — PX: COLONOSCOPY WITH PROPOFOL: SHX5780

## 2021-01-18 HISTORY — DX: Morbid (severe) obesity due to excess calories: E66.01

## 2021-01-18 LAB — GLUCOSE, CAPILLARY: Glucose-Capillary: 150 mg/dL — ABNORMAL HIGH (ref 70–99)

## 2021-01-18 SURGERY — COLONOSCOPY WITH PROPOFOL
Anesthesia: General

## 2021-01-18 MED ORDER — ACETAMINOPHEN 160 MG/5ML PO SOLN: 325.0000 mg | ORAL | Status: DC | PRN

## 2021-01-18 MED ORDER — LACTATED RINGERS IV SOLN: INTRAVENOUS | Status: DC

## 2021-01-18 MED ORDER — ONDANSETRON HCL 4 MG/2ML IJ SOLN: 4.0000 mg | Freq: Once | INTRAMUSCULAR | Status: DC | PRN

## 2021-01-18 MED ORDER — STERILE WATER FOR IRRIGATION IR SOLN
Status: DC | PRN
  Administered 2021-01-18: .05 mL

## 2021-01-18 MED ORDER — PROPOFOL 10 MG/ML IV BOLUS
INTRAVENOUS | Status: DC | PRN
  Administered 2021-01-18: 70 mg via INTRAVENOUS
  Administered 2021-01-18: 100 mg via INTRAVENOUS
  Administered 2021-01-18 (×2): 50 mg via INTRAVENOUS

## 2021-01-18 MED ORDER — LIDOCAINE HCL (CARDIAC) PF 100 MG/5ML IV SOSY
PREFILLED_SYRINGE | INTRAVENOUS | Status: DC | PRN
  Administered 2021-01-18: 100 mg via INTRAVENOUS

## 2021-01-18 MED ORDER — ACETAMINOPHEN 325 MG PO TABS: 325.0000 mg | ORAL_TABLET | ORAL | Status: DC | PRN

## 2021-01-18 SURGICAL SUPPLY — 7 items
GOWN CVR UNV OPN BCK APRN NK (MISCELLANEOUS) ×2 IMPLANT
GOWN ISOL THUMB LOOP REG UNIV (MISCELLANEOUS) ×4
KIT PRC NS LF DISP ENDO (KITS) ×1 IMPLANT
KIT PROCEDURE OLYMPUS (KITS) ×2
MANIFOLD NEPTUNE II (INSTRUMENTS) ×2 IMPLANT
SNARE COLD EXACTO (MISCELLANEOUS) IMPLANT
WATER STERILE IRR 250ML POUR (IV SOLUTION) ×2 IMPLANT

## 2021-01-18 NOTE — Anesthesia Procedure Notes (Signed)
Procedure Name: General with mask airway Date/Time: 01/18/2021 9:50 AM Performed by: Jinny Blossom, CRNA Pre-anesthesia Checklist: Patient identified, Emergency Drugs available, Suction available, Timeout performed and Patient being monitored Patient Re-evaluated:Patient Re-evaluated prior to induction Oxygen Delivery Method: Nasal cannula Placement Confirmation: positive ETCO2

## 2021-01-18 NOTE — Transfer of Care (Signed)
Immediate Anesthesia Transfer of Care Note  Patient: Jill Crosby  Procedure(s) Performed: COLONOSCOPY WITH PROPOFOL (N/A )  Patient Location: PACU  Anesthesia Type: General  Level of Consciousness: awake, alert  and patient cooperative  Airway and Oxygen Therapy: Patient Spontanous Breathing and Patient connected to supplemental oxygen  Post-op Assessment: Post-op Vital signs reviewed, Patient's Cardiovascular Status Stable, Respiratory Function Stable, Patent Airway and No signs of Nausea or vomiting  Post-op Vital Signs: Reviewed and stable  Complications: No complications documented.

## 2021-01-18 NOTE — Op Note (Addendum)
[facility] Gastroenterology Patient Name: Jill Crosby Procedure Date: 01/18/2021 9:38 AM MRN: [id number] Account #: [id number] Date of Birth: 06/30/1961 Admit Type: Outpatient Age: 60 Room: [facility] OR ROOM 01 Gender: Female Note Status: Finalized Procedure:             Colonoscopy Indications:           High risk colon cancer surveillance: Personal history                         of colonic polyps Providers:             Midge Minium MD, MD Referring MD:          Dale Frederica, MD (Referring MD) Medicines:             Propofol per Anesthesia Complications:         No immediate complications. Procedure:             Pre-Anesthesia Assessment:                        - Prior to the procedure, a History and Physical was                         performed, and patient medications and allergies were                         reviewed. The patient's tolerance of previous                         anesthesia was also reviewed. The risks and benefits                         of the procedure and the sedation options and risks                         were discussed with the patient. All questions were                         answered, and informed consent was obtained. Prior                         Anticoagulants: The patient has taken no previous                         anticoagulant or antiplatelet agents. ASA Grade                         Assessment: II - A patient with mild systemic disease.                         After reviewing the risks and benefits, the patient                         was deemed in satisfactory condition to undergo the                         procedure.  After obtaining informed consent, the colonoscope was                         passed under direct vision. Throughout the procedure,                         the patient's blood pressure, pulse, and oxygen                         saturations were monitored continuously. The was                          introduced through the anus and advanced to the the                         cecum, identified by appendiceal orifice and ileocecal                         valve. The colonoscopy was performed without                         difficulty. The patient tolerated the procedure well.                         The quality of the bowel preparation was excellent. Findings:      The perianal and digital rectal examinations were normal.      The colon (entire examined portion) appeared normal. Impression:            - The entire examined colon is normal.                        - No specimens collected. Recommendation:        - Discharge patient to home.                        - Resume previous diet.                        - Continue present medications.                        - Repeat colonoscopy in 5 years for surveillance. Procedure Code(s):     --- Professional ---                        [phone], Colonoscopy, flexible; diagnostic, including                         collection of specimen(s) by brushing or washing, when                         performed (separate procedure) Diagnosis Code(s):     --- Professional ---                        Z86.010, Personal history of colonic polyps CPT copyright 2019 American Medical Association. All rights reserved. The codes documented in this report are preliminary and upon coder review may  be revised to meet current compliance requirements. Midge Minium MD, MD 01/18/2021 10:01:56 AM This report  has been signed electronically. Number of Addenda: 0 Note Initiated On: 01/18/2021 9:38 AM Scope Withdrawal Time: 0 hours 7 minutes 52 seconds  Total Procedure Duration: 0 hours 11 minutes 53 seconds  Estimated Blood Loss:  Estimated blood loss: none.      [facility]

## 2021-01-18 NOTE — Anesthesia Postprocedure Evaluation (Signed)
Anesthesia Post Note  Patient: Jill Crosby  Procedure(s) Performed: COLONOSCOPY WITH PROPOFOL (N/A )     Patient location during evaluation: PACU Anesthesia Type: General Level of consciousness: awake Pain management: pain level controlled Vital Signs Assessment: post-procedure vital signs reviewed and stable Respiratory status: respiratory function stable Cardiovascular status: stable Postop Assessment: no signs of nausea or vomiting Anesthetic complications: no   No complications documented.  Jola Babinski

## 2021-01-18 NOTE — Anesthesia Preprocedure Evaluation (Signed)
Anesthesia Evaluation  Patient identified by MRN, date of birth, ID band Patient awake    Reviewed: Allergy & Precautions, H&P , NPO status , Patient's Chart, lab work & pertinent test results  Airway Mallampati: II  TM Distance: >3 FB Neck ROM: full    Dental   Pulmonary former smoker,    breath sounds clear to auscultation       Cardiovascular Exercise Tolerance: Good hypertension,  Rhythm:regular Rate:Normal     Neuro/Psych Anxiety Depression    GI/Hepatic   Endo/Other  diabetes, Type 2, Insulin Dependent  Renal/GU      Musculoskeletal   Abdominal   Peds  Hematology   Anesthesia Other Findings   Reproductive/Obstetrics                             Anesthesia Physical  Anesthesia Plan  ASA: III  Anesthesia Plan: General   Post-op Pain Management:    Induction: Intravenous  PONV Risk Score and Plan: 2 and Treatment may vary due to age or medical condition, TIVA and Propofol infusion  Airway Management Planned: Nasal Cannula and Natural Airway  Additional Equipment:   Intra-op Plan:   Post-operative Plan:   Informed Consent: I have reviewed the patients History and Physical, chart, labs and discussed the procedure including the risks, benefits and alternatives for the proposed anesthesia with the patient or authorized representative who has indicated his/her understanding and acceptance.     Dental Advisory Given  Plan Discussed with: CRNA  Anesthesia Plan Comments:         Anesthesia Quick Evaluation

## 2021-01-18 NOTE — H&P (Signed)
Midge Minium, MD [facility] [address], [address] [place], [place] [address] Phone:[phone] Fax : [phone]  Primary Care Physician:  Dale [place], MD Primary Gastroenterologist:  Dr. Servando Snare  Pre-Procedure History & Physical: HPI:  Jill Crosby is a 60 y.o. female is here for an colonoscopy.   Past Medical History:  Diagnosis Date  . Anxiety   . Depression   . Elevated BP   . Hypercholesterolemia   . Increased BMI   . Menopause   . Type I diabetes mellitus (HCC)     Past Surgical History:  Procedure Laterality Date  . BREAST BIOPSY Left 05/09/2020   ribbon clip, stereo bx, path pending  . CATARACT EXTRACTION W/PHACO Left 11/08/2020   Procedure: CATARACT EXTRACTION PHACO AND INTRAOCULAR LENS PLACEMENT (IOC) LEFT DIABETIC 4.14 00:52.1 ;  Surgeon: Galen Manila, MD;  Location: [facility];  Service: Ophthalmology;  Laterality: Left;  . CATARACT EXTRACTION W/PHACO Right 12/04/2020   Procedure: CATARACT EXTRACTION PHACO AND INTRAOCULAR LENS PLACEMENT (IOC) RIGHT DIABETIC 6.11 00:46.1;  Surgeon: Galen Manila, MD;  Location: [facility];  Service: Ophthalmology;  Laterality: Right;  Diabetic - insulin  . CESAREAN SECTION  1997    Prior to Admission medications   Medication Sig Start Date End Date Taking? Authorizing Provider  amLODipine (NORVASC) 10 MG tablet TAKE 1 TABLET BY MOUTH  DAILY 10/30/20  Yes Dale [place], MD  BAYER MICROLET LANCETS lancets USE AS DIRECTED TO CHECK  SUGARS 1 TO 2 TIMES A DAY 01/15/18  Yes Dale [place], MD  cholecalciferol (VITAMIN D3) 25 MCG (1000 UT) tablet Take 1,000 Units by mouth daily.   Yes [provider]  Cyanocobalamin (B-12 PO) Take by mouth.   Yes [provider]  ezetimibe (ZETIA) 10 MG tablet TAKE 1 TABLET BY MOUTH  DAILY 05/23/20  Yes Dale [place], MD  glucose blood (ONETOUCH VERIO) test strip USE TO CHECK BLOOD GLUCOSE  6 TO 7 TIMES DAILY 11/09/20  Yes Dale [place], MD   hydrochlorothiazide (HYDRODIURIL) 25 MG tablet TAKE 1 TABLET BY MOUTH  DAILY 05/23/20  Yes Dale [place], MD  insulin glargine (LANTUS SOLOSTAR) 100 UNIT/ML Solostar Pen INJECT SUBCUTANEOUSLY 50  UNITS AT NIGHT 10/30/20  Yes Dale [place], MD  insulin lispro (HUMALOG KWIKPEN) 100 UNIT/ML KwikPen INJECT SUBCUTANEOUSLY 50  UNITS DAILY 10/11/20  Yes Dale [place], MD  Insulin Pen Needle 32G X 4 MM MISC Use as directed with insulin 02/04/19  Yes Dale [place], MD  levocetirizine (XYZAL) 5 MG tablet Take 1 tablet (5 mg total) by mouth every evening. 11/09/20  Yes Dale [place], MD  lisinopril (ZESTRIL) 40 MG tablet TAKE 1 TABLET BY MOUTH  DAILY 12/20/20  Yes Dale [place], MD  sertraline (ZOLOFT) 50 MG tablet TAKE 1 TABLET BY MOUTH  DAILY 05/23/20  Yes Dale [place], MD  meclizine (ANTIVERT) 12.5 MG tablet Take 1 tablet (12.5 mg total) by mouth 2 (two) times daily as needed for dizziness. Patient not taking: Reported on 10/04/2020 11/01/19   Dale [place], MD  ondansetron (ZOFRAN ODT) 4 MG disintegrating tablet Take 1 tablet (4 mg total) by mouth 2 (two) times daily as needed for nausea or vomiting. 11/01/19   Dale [place], MD    Allergies as of 10/04/2020 - Review Complete 10/04/2020  Allergen Reaction Noted  . Codeine  03/21/2013    Family History  Problem Relation Age of Onset  . Hypertension Mother   . Hyperlipidemia Mother   . Colon polyps Mother   . Cancer Father  lung  . Colon cancer Maternal Grandfather   . Diabetes Maternal Grandfather   . Breast cancer Maternal Grandmother   . Colon cancer Paternal Grandfather   . Breast cancer Maternal Aunt   . Heart disease Neg Hx   . Ovarian cancer Neg Hx     Social History   Socioeconomic History  . Marital status: Single    Spouse name: Not on file  . Number of children: Not on file  . Years of education: Not on file  . Highest education level: Not on file  Occupational History  . Not on file  Tobacco Use  .  Smoking status: Former Smoker    Packs/day: 0.50    Years: 20.00    Pack years: 10.00    Types: Cigarettes  . Smokeless tobacco: Never Used  Substance and Sexual Activity  . Alcohol use: No    Alcohol/week: 0.0 standard drinks  . Drug use: No  . Sexual activity: Not Currently    Birth control/protection: Post-menopausal  Other Topics Concern  . Not on file  Social History Narrative  . Not on file   Social Determinants of Health   Financial Resource Strain: Not on file  Food Insecurity: Not on file  Transportation Needs: Not on file  Physical Activity: Not on file  Stress: Not on file  Social Connections: Not on file  Intimate Partner Violence: Not on file    Review of Systems: See HPI, otherwise negative ROS  Physical Exam: There were no vitals taken for this visit. General:   Alert,  pleasant and cooperative in NAD Head:  Normocephalic and atraumatic. Neck:  Supple; no masses or thyromegaly. Lungs:  Clear throughout to auscultation.    Heart:  Regular rate and rhythm. Abdomen:  Soft, nontender and nondistended. Normal bowel sounds, without guarding, and without rebound.   Neurologic:  Alert and  oriented x4;  grossly normal neurologically.  Impression/Plan: Jill Crosby is here for an colonoscopy to be performed for a history of adenomatous polyps on   Risks, benefits, limitations, and alternatives regarding  colonoscopy have been reviewed with the patient.  Questions have been answered.  All parties agreeable.   Midge Minium, MD  01/18/2021, 9:04 AM

## 2021-01-21 ENCOUNTER — Encounter: Payer: Self-pay | Admitting: Gastroenterology

## 2021-02-15 ENCOUNTER — Other Ambulatory Visit: Payer: Self-pay | Admitting: Internal Medicine

## 2021-04-24 ENCOUNTER — Other Ambulatory Visit: Payer: Self-pay | Admitting: Internal Medicine

## 2021-07-25 LAB — HEMOGLOBIN A1C: Hemoglobin A1C: 7

## 2021-09-12 ENCOUNTER — Other Ambulatory Visit: Payer: Self-pay | Admitting: Internal Medicine

## 2021-09-13 NOTE — Telephone Encounter (Signed)
I sent rx for refill to mail order. Please confirm this is correct.  Also she is over due labs.  Does need labs and also needs earlier f/u appt with me.  Last seen 08/2020.

## 2021-09-17 ENCOUNTER — Telehealth: Payer: Self-pay

## 2021-09-17 DIAGNOSIS — E538 Deficiency of other specified B group vitamins: Secondary | ICD-10-CM

## 2021-09-17 DIAGNOSIS — E7800 Pure hypercholesterolemia, unspecified: Secondary | ICD-10-CM

## 2021-09-17 DIAGNOSIS — I10 Essential (primary) hypertension: Secondary | ICD-10-CM

## 2021-09-17 NOTE — Telephone Encounter (Signed)
Labs ordered for [facility]. Did not check lipid or A1C. Pt had done for work 8/25. Added b12 level per pt request.

## 2021-09-17 NOTE — Telephone Encounter (Signed)
Confirmed. Ordered [facility] labs. Moved appt up.

## 2021-09-19 LAB — CBC WITH DIFFERENTIAL/PLATELET
Basophils Absolute: 0.1 10*3/uL (ref 0.0–0.2)
Basos: 1 %
EOS (ABSOLUTE): 0.4 10*3/uL (ref 0.0–0.4)
Eos: 4 %
Hematocrit: 45.1 % (ref 34.0–46.6)
Hemoglobin: 14.7 g/dL (ref 11.1–15.9)
Immature Grans (Abs): 0 10*3/uL (ref 0.0–0.1)
Immature Granulocytes: 0 %
Lymphocytes Absolute: 2.5 10*3/uL (ref 0.7–3.1)
Lymphs: 25 %
MCH: 29.5 pg (ref 26.6–33.0)
MCHC: 32.6 g/dL (ref 31.5–35.7)
MCV: 90 fL (ref 79–97)
Monocytes Absolute: 0.6 10*3/uL (ref 0.1–0.9)
Monocytes: 6 %
Neutrophils Absolute: 6.4 10*3/uL (ref 1.4–7.0)
Neutrophils: 64 %
Platelets: 253 10*3/uL (ref 150–450)
RBC: 4.99 x10E6/uL (ref 3.77–5.28)
RDW: 12.6 % (ref 11.7–15.4)
WBC: 10 10*3/uL (ref 3.4–10.8)

## 2021-09-19 LAB — BASIC METABOLIC PANEL
BUN/Creatinine Ratio: 15 (ref 12–28)
BUN: 13 mg/dL (ref 8–27)
CO2: 21 mmol/L (ref 20–29)
Calcium: 10.1 mg/dL (ref 8.7–10.3)
Chloride: 98 mmol/L (ref 96–106)
Creatinine, Ser: 0.89 mg/dL (ref 0.57–1.00)
Glucose: 96 mg/dL (ref 70–99)
Potassium: 4 mmol/L (ref 3.5–5.2)
Sodium: 138 mmol/L (ref 134–144)
eGFR: 73 mL/min/{1.73_m2} (ref >59)

## 2021-09-19 LAB — HEPATIC FUNCTION PANEL
ALT: 17 IU/L (ref 0–32)
AST: 17 IU/L (ref 0–40)
Albumin: 4.6 g/dL (ref 3.8–4.8)
Alkaline Phosphatase: 60 IU/L (ref 44–121)
Bilirubin Total: 0.3 mg/dL (ref 0.0–1.2)
Bilirubin, Direct: <0.1 mg/dL (ref 0.00–0.40)
Total Protein: 7.3 g/dL (ref 6.0–8.5)

## 2021-09-19 LAB — VITAMIN B12: Vitamin B-12: 1247 pg/mL — ABNORMAL HIGH (ref 232–1245)

## 2021-09-19 LAB — TSH: TSH: 1.75 u[IU]/mL (ref 0.450–4.500)

## 2021-09-24 ENCOUNTER — Other Ambulatory Visit: Payer: Self-pay | Admitting: Internal Medicine

## 2021-09-24 ENCOUNTER — Telehealth (INDEPENDENT_AMBULATORY_CARE_PROVIDER_SITE_OTHER): Payer: Managed Care, Other (non HMO) | Admitting: Internal Medicine

## 2021-09-24 VITALS — BP 130/69

## 2021-09-24 DIAGNOSIS — E7800 Pure hypercholesterolemia, unspecified: Secondary | ICD-10-CM

## 2021-09-24 DIAGNOSIS — I10 Essential (primary) hypertension: Secondary | ICD-10-CM

## 2021-09-24 DIAGNOSIS — Z860100 Personal history of colon polyps, unspecified: Secondary | ICD-10-CM

## 2021-09-24 DIAGNOSIS — E1065 Type 1 diabetes mellitus with hyperglycemia: Secondary | ICD-10-CM

## 2021-09-24 DIAGNOSIS — Z1231 Encounter for screening mammogram for malignant neoplasm of breast: Secondary | ICD-10-CM | POA: Diagnosis not present

## 2021-09-24 DIAGNOSIS — N6320 Unspecified lump in the left breast, unspecified quadrant: Secondary | ICD-10-CM

## 2021-09-24 DIAGNOSIS — F419 Anxiety disorder, unspecified: Secondary | ICD-10-CM

## 2021-09-24 NOTE — Progress Notes (Signed)
Patient ID: Jill Crosby, female   DOB: 11/02/1961, 60 y.o.   MRN: [id number]   Virtual Visit via telephone Note  This visit type was conducted due to national recommendations for restrictions regarding the COVID-19 pandemic (e.g. social distancing).  This format is felt to be most appropriate for this patient at this time.  All issues noted in this document were discussed and addressed.  No physical exam was performed (except for noted visual exam findings with Video Visits).   I connected with Michael Boston by telephone and verified that I am speaking with the correct person using two identifiers. Location patient: home Location provider: work  Persons participating in the telephone visit: patient, provider  The limitations, risks, security and privacy concerns of performing an evaluation and management service by telephone and the availability of in person appointments have been discussed.  It has also been discussed with the patient that there may be a patient responsible charge related to this service. The patient expressed understanding and agreed to proceed.   Reason for visit: follow up appt  HPI: Overdue f/u.  Here to follow up regarding her blood sugar, blood pressure and cholesterol.  States am sugars averaging 80-120.  Taking lantus - has adjusted timing - taking 30 units in am now.  On zetia.  No chest pain or sob reported.  No abdominal pain.  Has been drinking more water.  Has quit smoking.  Blood pressure ok.     ROS: See pertinent positives and negatives per HPI.  Past Medical History:  Diagnosis Date   Anxiety    Depression    Elevated BP    Hypercholesterolemia    Increased BMI    Menopause    Morbid obesity ([place])    Type I diabetes mellitus ([place])     Past Surgical History:  Procedure Laterality Date   BREAST BIOPSY Left 05/09/2020   ribbon clip, stereo bx, path pending   CATARACT EXTRACTION W/PHACO Left 11/08/2020   Procedure: CATARACT EXTRACTION PHACO  AND INTRAOCULAR LENS PLACEMENT (IOC) LEFT DIABETIC 4.14 00:52.1 ;  Surgeon: Birder Robson, MD;  Location: [place];  Service: Ophthalmology;  Laterality: Left;   CATARACT EXTRACTION W/PHACO Right 12/04/2020   Procedure: CATARACT EXTRACTION PHACO AND INTRAOCULAR LENS PLACEMENT (IOC) RIGHT DIABETIC 6.11 00:46.1;  Surgeon: Birder Robson, MD;  Location: [place];  Service: Ophthalmology;  Laterality: Right;  Diabetic - insulin   CESAREAN SECTION  1997   COLONOSCOPY WITH PROPOFOL N/A 01/18/2021   Procedure: COLONOSCOPY WITH PROPOFOL;  Surgeon: Lucilla Lame, MD;  Location: [place];  Service: Endoscopy;  Laterality: N/A;  priority 4    Family History  Problem Relation Age of Onset   Hypertension Mother    Hyperlipidemia Mother    Colon polyps Mother    Cancer Father        lung   Colon cancer Maternal Grandfather    Diabetes Maternal Grandfather    Breast cancer Maternal Grandmother    Colon cancer Paternal Grandfather    Breast cancer Maternal Aunt    Heart disease Neg Hx    Ovarian cancer Neg Hx     SOCIAL HX: reviewed.    Current Outpatient Medications:    amLODipine (NORVASC) 10 MG tablet, TAKE 1 TABLET BY MOUTH  DAILY, Disp: 90 tablet, Rfl: 3   BAYER MICROLET LANCETS lancets, USE AS DIRECTED TO CHECK  SUGARS 1 TO 2 TIMES A DAY, Disp: 300 each, Rfl: 2   cholecalciferol (VITAMIN D3)  25 MCG (1000 UT) tablet, Take 1,000 Units by mouth daily., Disp: , Rfl:    CONTOUR TEST test strip, USE TO CHECK BLOOD GLUCOSE  6 TO 7 TIMES DAILY, Disp: 700 strip, Rfl: 3   Cyanocobalamin (B-12 PO), Take by mouth., Disp: , Rfl:    ezetimibe (ZETIA) 10 MG tablet, TAKE 1 TABLET BY MOUTH  DAILY, Disp: 90 tablet, Rfl: 3   hydrochlorothiazide (HYDRODIURIL) 25 MG tablet, TAKE 1 TABLET BY MOUTH  DAILY, Disp: 90 tablet, Rfl: 3   insulin lispro (HUMALOG KWIKPEN) 100 UNIT/ML KwikPen, INJECT SUBCUTANEOUSLY 50  UNITS DAILY, Disp: 45 mL, Rfl: 1   Insulin Pen Needle 32G X 4 MM  MISC, Use as directed with insulin, Disp: 200 each, Rfl: 3   LANTUS SOLOSTAR 100 UNIT/ML Solostar Pen, INJECT SUBCUTANEOUSLY 50  UNITS AT NIGHT, Disp: 45 mL, Rfl: 3   levocetirizine (XYZAL) 5 MG tablet, TAKE 1 TABLET BY MOUTH IN  THE EVENING, Disp: 90 tablet, Rfl: 0   lisinopril (ZESTRIL) 40 MG tablet, TAKE 1 TABLET BY MOUTH  DAILY, Disp: 90 tablet, Rfl: 3   ondansetron (ZOFRAN ODT) 4 MG disintegrating tablet, Take 1 tablet (4 mg total) by mouth 2 (two) times daily as needed for nausea or vomiting., Disp: 20 tablet, Rfl: 0   sertraline (ZOLOFT) 50 MG tablet, TAKE 1 TABLET BY MOUTH  DAILY, Disp: 90 tablet, Rfl: 3  EXAM:  VITALS per patient if applicable: 665/99  GENERAL: alert. Sounds to be in no acute distress.  Answering questions appropriately.   PSYCH/NEURO: pleasant and cooperative, no obvious depression or anxiety, speech and thought processing grossly intact  ASSESSMENT AND PLAN:  Discussed the following assessment and plan:  Problem List Items Addressed This Visit     Anxiety    Continue zoloft.  Appears to be stable.  Follow.       Essential hypertension    On amlodipine and lisinopril.  Spot check pressures.  Follow pressures and metabolic panel.  Continue current medication regimen. Blood pressure as outlined.       Relevant Orders   Basic metabolic panel   Hypercholesterolemia    On zetia. Did not tolerate statin medication.  Low cholesterol diet and exercise.  Follow lipid panel.       Relevant Orders   Hepatic function panel   Lipid panel   Personal history of colonic polyps    Colonoscopy 01/18/21 - normal.  (Dr Allen Norris).  Recommended f/u colonoscopy in 5 years.        Type 1 diabetes mellitus ([facility])    Sugars as outlined.  On insulin.  Discussed low carb diet and exercise.  Follow met b and a1c.  Up to date with eye checks.        Relevant Orders   Hemoglobin A1c   Other Visit Diagnoses     Visit for screening mammogram    -  Primary   Relevant Orders    MM 3D SCREEN BREAST BILATERAL       Return if symptoms worsen or fail to improve, for keep f/u.   I discussed the assessment and treatment plan with the patient. The patient was provided an opportunity to ask questions and all were answered. The patient agreed with the plan and demonstrated an understanding of the instructions.   The patient was advised to call back or seek an in-person evaluation if the symptoms worsen or if the condition fails to improve as anticipated.  I provided 25 minutes of non-face-to-face time  during this encounter.   Einar Pheasant, MD

## 2021-09-26 ENCOUNTER — Other Ambulatory Visit: Payer: Self-pay | Admitting: Internal Medicine

## 2021-10-01 ENCOUNTER — Other Ambulatory Visit: Payer: Self-pay | Admitting: Internal Medicine

## 2021-10-06 ENCOUNTER — Encounter: Payer: Self-pay | Admitting: Internal Medicine

## 2021-10-06 NOTE — Assessment & Plan Note (Signed)
On zetia. Did not tolerate statin medication.  Low cholesterol diet and exercise.  Follow lipid panel.  

## 2021-10-06 NOTE — Assessment & Plan Note (Signed)
On amlodipine and lisinopril.  Spot check pressures.  Follow pressures and metabolic panel.  Continue current medication regimen. Blood pressure as outlined.  

## 2021-10-06 NOTE — Assessment & Plan Note (Signed)
Sugars as outlined.  On insulin.  Discussed low carb diet and exercise.  Follow met b and a1c.  Up to date with eye checks.   

## 2021-10-06 NOTE — Assessment & Plan Note (Signed)
Continue zoloft.  Appears to be stable.  Follow.   °

## 2021-10-06 NOTE — Assessment & Plan Note (Addendum)
Colonoscopy 01/18/21 - normal.  (Dr Servando Snare).  Recommended f/u colonoscopy in 5 years.

## 2021-11-21 ENCOUNTER — Other Ambulatory Visit: Payer: Self-pay | Admitting: Internal Medicine

## 2021-12-19 ENCOUNTER — Other Ambulatory Visit: Payer: Self-pay | Admitting: Internal Medicine

## 2021-12-30 ENCOUNTER — Other Ambulatory Visit: Payer: Self-pay

## 2021-12-30 ENCOUNTER — Ambulatory Visit: Payer: Managed Care, Other (non HMO) | Admitting: Internal Medicine

## 2021-12-30 VITALS — BP 134/72 | HR 98 | Temp 98.0°F | Resp 16 | Ht 64.0 in | Wt 244.0 lb

## 2021-12-30 DIAGNOSIS — Z1159 Encounter for screening for other viral diseases: Secondary | ICD-10-CM

## 2021-12-30 DIAGNOSIS — F419 Anxiety disorder, unspecified: Secondary | ICD-10-CM

## 2021-12-30 DIAGNOSIS — I10 Essential (primary) hypertension: Secondary | ICD-10-CM | POA: Diagnosis not present

## 2021-12-30 DIAGNOSIS — R252 Cramp and spasm: Secondary | ICD-10-CM | POA: Diagnosis not present

## 2021-12-30 DIAGNOSIS — Z114 Encounter for screening for human immunodeficiency virus [HIV]: Secondary | ICD-10-CM

## 2021-12-30 DIAGNOSIS — E1065 Type 1 diabetes mellitus with hyperglycemia: Secondary | ICD-10-CM

## 2021-12-30 DIAGNOSIS — E7800 Pure hypercholesterolemia, unspecified: Secondary | ICD-10-CM

## 2021-12-30 DIAGNOSIS — Z86010 Personal history of colonic polyps: Secondary | ICD-10-CM

## 2021-12-30 LAB — HM DIABETES FOOT EXAM

## 2021-12-30 NOTE — Progress Notes (Signed)
Patient ID: Jill Crosby, female   DOB: 1961/02/22, 61 y.o.   MRN: [id number]   Subjective:    Patient ID: Jill Crosby, female    DOB: March 02, 1961, 61 y.o.   MRN: [id number]  This visit occurred during the SARS-CoV-2 public health emergency.  Safety protocols were in place, including screening questions prior to the visit, additional usage of staff PPE, and extensive cleaning of exam room while observing appropriate contact time as indicated for disinfecting solutions.   Patient here for a scheduled follow up.   Chief Complaint  Patient presents with   Diabetes   Hypertension   Hyperlipidemia   .   HPI She had previously adjusted her insulin.  Still taking lantus bid.  States sugars are improved since adjusting.  Reports sugars 130-180s.  Reports blood pressures <130/70.  No chest pain reported.  Breathing stable.  Does report cramp - right mid arm.  Also cramp - turn and twist - rib.  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  Taking ibuprofen and tylenol arthritis - Left > right - walking aggravates.  No nausea or vomiting.  No bowel change reported.  On zoloft.  Stable.  Handling stress.     Past Medical History:  Diagnosis Date   Anxiety    Depression    Elevated BP    Hypercholesterolemia    Increased BMI    Menopause    Morbid obesity ([place])    Type I diabetes mellitus ([place])    Past Surgical History:  Procedure Laterality Date   BREAST BIOPSY Left 05/09/2020   ribbon clip, stereo bx, path pending   CATARACT EXTRACTION W/PHACO Left 11/08/2020   Procedure: CATARACT EXTRACTION PHACO AND INTRAOCULAR LENS PLACEMENT (IOC) LEFT DIABETIC 4.14 00:52.1 ;  Surgeon: Birder Robson, MD;  Location: [place];  Service: Ophthalmology;  Laterality: Left;   CATARACT EXTRACTION W/PHACO Right 12/04/2020   Procedure: CATARACT EXTRACTION PHACO AND INTRAOCULAR LENS PLACEMENT (IOC) RIGHT DIABETIC 6.11 00:46.1;  Surgeon: Birder Robson, MD;  Location: [place];  Service:  Ophthalmology;  Laterality: Right;  Diabetic - insulin   CESAREAN SECTION  1997   COLONOSCOPY WITH PROPOFOL N/A 01/18/2021   Procedure: COLONOSCOPY WITH PROPOFOL;  Surgeon: Lucilla Lame, MD;  Location: [place];  Service: Endoscopy;  Laterality: N/A;  priority 4   Family History  Problem Relation Age of Onset   Hypertension Mother    Hyperlipidemia Mother    Colon polyps Mother    Cancer Father        lung   Colon cancer Maternal Grandfather    Diabetes Maternal Grandfather    Breast cancer Maternal Grandmother    Colon cancer Paternal Grandfather    Breast cancer Maternal Aunt    Heart disease Neg Hx    Ovarian cancer Neg Hx    Social History   Socioeconomic History   Marital status: Single    Spouse name: Not on file   Number of children: Not on file   Years of education: Not on file   Highest education level: Not on file  Occupational History   Not on file  Tobacco Use   Smoking status: Former    Packs/day: 0.50    Years: 20.00    Pack years: 10.00    Types: Cigarettes   Smokeless tobacco: Never  Substance and Sexual Activity   Alcohol use: No    Alcohol/week: 0.0 standard drinks   Drug use: No   Sexual activity: Not Currently  Birth control/protection: Post-menopausal  Other Topics Concern   Not on file  Social History Narrative   Not on file   Social Determinants of Health   Financial Resource Strain: Not on file  Food Insecurity: Not on file  Transportation Needs: Not on file  Physical Activity: Not on file  Stress: Not on file  Social Connections: Not on file     Review of Systems  Constitutional:  Negative for appetite change and unexpected weight change.  HENT:  Negative for congestion and sinus pressure.   Respiratory:  Negative for cough, chest tightness and shortness of breath.   Cardiovascular:  Negative for chest pain, palpitations and leg swelling.  Gastrointestinal:  Negative for abdominal pain, diarrhea, nausea and vomiting.   Genitourinary:  Negative for difficulty urinating and dysuria.  Musculoskeletal:  Negative for joint swelling and myalgias.       Muscle cramping as outlined.    Skin:  Negative for color change and rash.  Neurological:  Negative for dizziness, light-headedness and headaches.  Psychiatric/Behavioral:  Negative for agitation and dysphoric mood.       Objective:     BP 134/72    Pulse 98    Temp 98 F (36.7 C)    Resp 16    Ht '5\' 4"'  (1.626 m)    Wt 244 lb (110.7 kg)    SpO2 98%    BMI 41.88 kg/m  Wt Readings from Last 3 Encounters:  12/30/21 244 lb (110.7 kg)  01/18/21 239 lb (108.4 kg)  12/04/20 243 lb (110.2 kg)    Physical Exam Vitals reviewed.  Constitutional:      General: She is not in acute distress.    Appearance: Normal appearance.  HENT:     Head: Normocephalic and atraumatic.     Right Ear: External ear normal.     Left Ear: External ear normal.  Eyes:     General: No scleral icterus.       Right eye: No discharge.        Left eye: No discharge.     Conjunctiva/sclera: Conjunctivae normal.  Neck:     Thyroid: No thyromegaly.  Cardiovascular:     Rate and Rhythm: Normal rate and regular rhythm.  Pulmonary:     Effort: No respiratory distress.     Breath sounds: Normal breath sounds. No wheezing.  Abdominal:     General: Bowel sounds are normal.     Palpations: Abdomen is soft.     Tenderness: There is no abdominal tenderness.  Musculoskeletal:        General: No swelling or tenderness.     Cervical back: Neck supple. No tenderness.  Lymphadenopathy:     Cervical: No cervical adenopathy.  Skin:    Findings: No erythema or rash.  Neurological:     Mental Status: She is alert.  Psychiatric:        Mood and Affect: Mood normal.        Behavior: Behavior normal.     Outpatient Encounter Medications as of 12/30/2021  Medication Sig   amLODipine (NORVASC) 10 MG tablet TAKE 1 TABLET BY MOUTH  DAILY   BAYER MICROLET LANCETS lancets USE AS DIRECTED TO  CHECK  SUGARS 1 TO 2 TIMES A DAY   cholecalciferol (VITAMIN D3) 25 MCG (1000 UT) tablet Take 1,000 Units by mouth daily.   CONTOUR TEST test strip USE TO CHECK BLOOD GLUCOSE  6 TO 7 TIMES DAILY   Cyanocobalamin (B-12 PO) Take by  mouth.   ezetimibe (ZETIA) 10 MG tablet TAKE 1 TABLET BY MOUTH  DAILY   hydrochlorothiazide (HYDRODIURIL) 25 MG tablet TAKE 1 TABLET BY MOUTH  DAILY   insulin lispro (HUMALOG KWIKPEN) 100 UNIT/ML KwikPen INJECT SUBCUTANEOUSLY 50  UNITS DAILY   Insulin Pen Needle 32G X 4 MM MISC Use as directed with insulin   LANTUS SOLOSTAR 100 UNIT/ML Solostar Pen INJECT SUBCUTANEOUSLY 50  UNITS AT NIGHT   levocetirizine (XYZAL) 5 MG tablet TAKE 1 TABLET BY MOUTH IN  THE EVENING   lisinopril (ZESTRIL) 40 MG tablet TAKE 1 TABLET BY MOUTH  DAILY   ondansetron (ZOFRAN ODT) 4 MG disintegrating tablet Take 1 tablet (4 mg total) by mouth 2 (two) times daily as needed for nausea or vomiting.   sertraline (ZOLOFT) 50 MG tablet TAKE 1 TABLET BY MOUTH  DAILY   No facility-administered encounter medications on file as of 12/30/2021.     Lab Results  Component Value Date   WBC 10.0 09/18/2021   HGB 14.7 09/18/2021   HCT 45.1 09/18/2021   PLT 253 09/18/2021   GLUCOSE 96 09/18/2021   CHOL 230 (H) 10/18/2020   TRIG 249 (H) 10/18/2020   HDL 47 10/18/2020   LDLCALC 138 (H) 10/18/2020   ALT 17 09/18/2021   AST 17 09/18/2021   NA 138 09/18/2021   K 4.0 09/18/2021   CL 98 09/18/2021   CREATININE 0.89 09/18/2021   BUN 13 09/18/2021   CO2 21 09/18/2021   TSH 1.750 09/18/2021   HGBA1C 7.0 07/25/2021       Assessment & Plan:   Problem List Items Addressed This Visit     Anxiety    Continue zoloft.  Appears to be stable.  Follow.       Essential hypertension    On amlodipine and lisinopril.  Spot check pressures.  Follow pressures and metabolic panel.  Continue current medication regimen.       Hypercholesterolemia    On zetia. Did not tolerate statin medication.  Low cholesterol  diet and exercise.  Follow lipid panel.       Muscle cramps - Primary    Discussed stretches.  Check electrolytes, calcium and magnesium with next labs.        Relevant Orders   Magnesium   Personal history of colonic polyps    Colonoscopy 01/2021 - normal (Dr Allen Norris).  Recommended f/u colonoscopy in 5 years.       Type 1 diabetes mellitus with hyperglycemia ([facility])    Low carb diet and exercise.  Sugars as outlined.  Has adjusted her insulin.  Taking lantus bid.  Sugars improved.  Follow met b and a1c.       Other Visit Diagnoses     Encounter for hepatitis C screening test for low risk patient       Relevant Orders   Hepatitis C antibody   Screening for HIV without presence of risk factors       Relevant Orders   HIV Antibody (routine testing w rflx)        Einar Pheasant, MD

## 2021-12-30 NOTE — Patient Instructions (Signed)
Voltaren gel (topical) 

## 2022-01-11 ENCOUNTER — Encounter: Payer: Self-pay | Admitting: Internal Medicine

## 2022-01-11 ENCOUNTER — Telehealth: Payer: Self-pay | Admitting: Internal Medicine

## 2022-01-11 DIAGNOSIS — E1065 Type 1 diabetes mellitus with hyperglycemia: Secondary | ICD-10-CM | POA: Insufficient documentation

## 2022-01-11 DIAGNOSIS — R252 Cramp and spasm: Secondary | ICD-10-CM | POA: Insufficient documentation

## 2022-01-11 NOTE — Telephone Encounter (Signed)
Appears to be overdue mammogram.  Orders are in.  Needs to be scheduled.  Thanks

## 2022-01-11 NOTE — Assessment & Plan Note (Signed)
Sugars as outlined.  On insulin.  Discussed low carb diet and exercise.  Follow met b and a1c.  Up to date with eye checks.   

## 2022-01-11 NOTE — Assessment & Plan Note (Signed)
Continue zoloft.  Appears to be stable.  Follow.   °

## 2022-01-11 NOTE — Assessment & Plan Note (Signed)
Low carb diet and exercise.  Sugars as outlined.  Has adjusted her insulin.  Taking lantus bid.  Sugars improved.  Follow met b and a1c.  

## 2022-01-11 NOTE — Assessment & Plan Note (Signed)
On amlodipine and lisinopril.  Spot check pressures.  Follow pressures and metabolic panel.  Continue current medication regimen.  

## 2022-01-11 NOTE — Assessment & Plan Note (Signed)
On zetia. Did not tolerate statin medication.  Low cholesterol diet and exercise.  Follow lipid panel.  

## 2022-01-11 NOTE — Assessment & Plan Note (Signed)
Colonoscopy 01/2021 - normal (Dr Wohl).  Recommended f/u colonoscopy in 5 years.  

## 2022-01-11 NOTE — Assessment & Plan Note (Signed)
Discussed stretches.  Check electrolytes, calcium and magnesium with next labs.

## 2022-01-13 NOTE — Telephone Encounter (Signed)
Pt returning call

## 2022-01-13 NOTE — Telephone Encounter (Signed)
Left detailed message for patient to schedule mammogram

## 2022-01-14 NOTE — Telephone Encounter (Signed)
Patient aware and already scheduled for mammo

## 2022-01-29 ENCOUNTER — Other Ambulatory Visit: Payer: Self-pay

## 2022-01-29 ENCOUNTER — Ambulatory Visit
Admission: RE | Admit: 2022-01-29 | Discharge: 2022-01-29 | Disposition: A | Discharge status: Home / Self Care | Payer: Managed Care, Other (non HMO) | Source: Ambulatory Visit | Attending: Internal Medicine | Admitting: Internal Medicine

## 2022-01-29 DIAGNOSIS — N6320 Unspecified lump in the left breast, unspecified quadrant: Secondary | ICD-10-CM | POA: Insufficient documentation

## 2022-02-26 ENCOUNTER — Other Ambulatory Visit: Payer: Self-pay | Admitting: Internal Medicine

## 2022-03-25 ENCOUNTER — Other Ambulatory Visit: Payer: Self-pay

## 2022-03-25 DIAGNOSIS — E7800 Pure hypercholesterolemia, unspecified: Secondary | ICD-10-CM

## 2022-03-25 DIAGNOSIS — I10 Essential (primary) hypertension: Secondary | ICD-10-CM

## 2022-03-25 DIAGNOSIS — E1065 Type 1 diabetes mellitus with hyperglycemia: Secondary | ICD-10-CM

## 2022-03-25 LAB — HIV ANTIBODY (ROUTINE TESTING W REFLEX): HIV Screen 4th Generation wRfx: NONREACTIVE

## 2022-03-25 LAB — MAGNESIUM: Magnesium: 2 mg/dL (ref 1.6–2.3)

## 2022-03-25 LAB — HEPATITIS C ANTIBODY: Hep C Virus Ab: NONREACTIVE

## 2022-03-26 ENCOUNTER — Telehealth: Payer: Self-pay | Admitting: Internal Medicine

## 2022-03-26 DIAGNOSIS — E7800 Pure hypercholesterolemia, unspecified: Secondary | ICD-10-CM

## 2022-03-26 DIAGNOSIS — E1065 Type 1 diabetes mellitus with hyperglycemia: Secondary | ICD-10-CM

## 2022-03-26 DIAGNOSIS — I10 Essential (primary) hypertension: Secondary | ICD-10-CM

## 2022-03-26 DIAGNOSIS — E559 Vitamin D deficiency, unspecified: Secondary | ICD-10-CM

## 2022-03-26 NOTE — Telephone Encounter (Signed)
Orders placed for [facility] labs.   

## 2022-03-27 ENCOUNTER — Other Ambulatory Visit: Payer: Self-pay | Admitting: Internal Medicine

## 2022-03-27 ENCOUNTER — Telehealth: Payer: Self-pay

## 2022-03-27 NOTE — Telephone Encounter (Signed)
Pt was advised that most of her labs were able to be added on/resulted except for A1C. ?Per Dr Lorin Picket - if pt agreeable - can do POCT A1C when pt comes in 6/15. ?Pt stated this would be fine and is agreeable to POCT A1C. ?

## 2022-03-28 LAB — BASIC METABOLIC PANEL
BUN/Creatinine Ratio: 14 (ref 12–28)
BUN: 14 mg/dL (ref 8–27)
CO2: 21 mmol/L (ref 20–29)
Calcium: 10 mg/dL (ref 8.7–10.3)
Chloride: 97 mmol/L (ref 96–106)
Creatinine, Ser: 1 mg/dL (ref 0.57–1.00)
Glucose: 95 mg/dL (ref 70–99)
Potassium: 4.3 mmol/L (ref 3.5–5.2)
Sodium: 139 mmol/L (ref 134–144)
eGFR: 63 mL/min/{1.73_m2} (ref >59)

## 2022-03-28 LAB — LIPID PANEL
Chol/HDL Ratio: 5.3 ratio — ABNORMAL HIGH (ref 0.0–4.4)
Cholesterol, Total: 229 mg/dL — ABNORMAL HIGH (ref 100–199)
HDL: 43 mg/dL (ref >39)
LDL Chol Calc (NIH): 132 mg/dL — ABNORMAL HIGH (ref 0–99)
Triglycerides: 304 mg/dL — ABNORMAL HIGH (ref 0–149)
VLDL Cholesterol Cal: 54 mg/dL — ABNORMAL HIGH (ref 5–40)

## 2022-03-28 LAB — HEPATIC FUNCTION PANEL
ALT: 16 IU/L (ref 0–32)
AST: 17 IU/L (ref 0–40)
Albumin: 4.3 g/dL (ref 3.8–4.8)
Alkaline Phosphatase: 64 IU/L (ref 44–121)
Bilirubin Total: 0.3 mg/dL (ref 0.0–1.2)
Bilirubin, Direct: 0.1 mg/dL (ref 0.00–0.40)
Total Protein: 7 g/dL (ref 6.0–8.5)

## 2022-03-28 LAB — SPECIMEN STATUS REPORT

## 2022-03-31 ENCOUNTER — Ambulatory Visit: Payer: Managed Care, Other (non HMO) | Admitting: Internal Medicine

## 2022-05-15 ENCOUNTER — Ambulatory Visit: Payer: Managed Care, Other (non HMO) | Admitting: Internal Medicine

## 2022-07-08 ENCOUNTER — Encounter: Payer: Self-pay | Admitting: Internal Medicine

## 2022-07-08 ENCOUNTER — Ambulatory Visit: Payer: Managed Care, Other (non HMO) | Admitting: Internal Medicine

## 2022-07-08 VITALS — BP 140/78 | HR 92 | Temp 98.6°F | Resp 17 | Ht 64.0 in | Wt 247.6 lb

## 2022-07-08 DIAGNOSIS — E7800 Pure hypercholesterolemia, unspecified: Secondary | ICD-10-CM

## 2022-07-08 DIAGNOSIS — E1065 Type 1 diabetes mellitus with hyperglycemia: Secondary | ICD-10-CM | POA: Diagnosis not present

## 2022-07-08 DIAGNOSIS — F419 Anxiety disorder, unspecified: Secondary | ICD-10-CM

## 2022-07-08 DIAGNOSIS — Z860100 Personal history of colon polyps, unspecified: Secondary | ICD-10-CM

## 2022-07-08 DIAGNOSIS — I10 Essential (primary) hypertension: Secondary | ICD-10-CM

## 2022-07-08 LAB — POCT GLYCOSYLATED HEMOGLOBIN (HGB A1C): Hemoglobin A1C: 7.1 % — AB (ref 4.0–5.6)

## 2022-07-08 MED ORDER — LANTUS SOLOSTAR 100 UNIT/ML ~~LOC~~ SOPN
PEN_INJECTOR | SUBCUTANEOUS | 3 refills | Status: DC
Start: 2022-07-08 — End: 2023-01-09

## 2022-07-08 MED ORDER — INSULIN LISPRO (1 UNIT DIAL) 100 UNIT/ML (KWIKPEN): PEN_INJECTOR | SUBCUTANEOUS | 3 refills | Status: DC

## 2022-07-08 MED ORDER — INSULIN PEN NEEDLE 32G X 4 MM MISC
3 refills | Status: DC
Start: 2022-07-08 — End: 2022-07-18

## 2022-07-08 NOTE — Progress Notes (Signed)
Patient ID: Jill Crosby, female   DOB: 02/28/1961, 61 y.o.   MRN: [id number]   Subjective:    Patient ID: Jill Crosby, female    DOB: 26-Jul-1961, 61 y.o.   MRN: [id number]   Patient here for a scheduled follow up .   HPI Here to follow up regarding her blood sugar, cholesterol and blood pressure.  She reports she is doing relatively well.  Reports sugars are doing ok - am sugar <120 and pm sugars 150-160.  Takes 50 units of lantus in the evening and 30 units q am.  Adjust her humalog.  (Takes 15-20 units) with meals.  No problems with low sugars.  Due to get her eyes checked next week.  No chest pain.  Breathing stable.  No increased cough or congestion.  No abdominal pain or bowel change reported.     Past Medical History:  Diagnosis Date   Anxiety    Depression    Elevated BP    Hypercholesterolemia    Increased BMI    Menopause    Morbid obesity ([place])    Type I diabetes mellitus ([place])    Past Surgical History:  Procedure Laterality Date   BREAST BIOPSY Left 05/09/2020   ribbon clip, stereo bx, HEMANGIOMA, BENIGN. - MAMMARY EPITHELIUM IS NOT PRESENT.   CATARACT EXTRACTION W/PHACO Left 11/08/2020   Procedure: CATARACT EXTRACTION PHACO AND INTRAOCULAR LENS PLACEMENT (IOC) LEFT DIABETIC 4.14 00:52.1 ;  Surgeon: Birder Robson, MD;  Location: [place];  Service: Ophthalmology;  Laterality: Left;   CATARACT EXTRACTION W/PHACO Right 12/04/2020   Procedure: CATARACT EXTRACTION PHACO AND INTRAOCULAR LENS PLACEMENT (IOC) RIGHT DIABETIC 6.11 00:46.1;  Surgeon: Birder Robson, MD;  Location: [place];  Service: Ophthalmology;  Laterality: Right;  Diabetic - insulin   CESAREAN SECTION  1997   COLONOSCOPY WITH PROPOFOL N/A 01/18/2021   Procedure: COLONOSCOPY WITH PROPOFOL;  Surgeon: Lucilla Lame, MD;  Location: [place];  Service: Endoscopy;  Laterality: N/A;  priority 4   Family History  Problem Relation Age of Onset   Hypertension Mother     Hyperlipidemia Mother    Colon polyps Mother    Cancer Father        lung   Colon cancer Maternal Grandfather    Diabetes Maternal Grandfather    Breast cancer Maternal Grandmother    Colon cancer Paternal Grandfather    Breast cancer Maternal Aunt    Heart disease Neg Hx    Ovarian cancer Neg Hx    Social History   Socioeconomic History   Marital status: Single    Spouse name: Not on file   Number of children: Not on file   Years of education: Not on file   Highest education level: Not on file  Occupational History   Not on file  Tobacco Use   Smoking status: Former    Packs/day: 0.50    Years: 20.00    Total pack years: 10.00    Types: Cigarettes   Smokeless tobacco: Never  Substance and Sexual Activity   Alcohol use: No    Alcohol/week: 0.0 standard drinks of alcohol   Drug use: No   Sexual activity: Not Currently    Birth control/protection: Post-menopausal  Other Topics Concern   Not on file  Social History Narrative   Not on file   Social Determinants of Health   Financial Resource Strain: Not on file  Food Insecurity: Not on file  Transportation Needs: Not on file  Physical Activity: Not on file  Stress: Not on file  Social Connections: Not on file     Review of Systems  Constitutional:  Negative for appetite change and unexpected weight change.  HENT:  Negative for congestion and sinus pressure.   Respiratory:  Negative for cough, chest tightness and shortness of breath.   Cardiovascular:  Negative for chest pain, palpitations and leg swelling.  Gastrointestinal:  Negative for abdominal pain, diarrhea, nausea and vomiting.  Genitourinary:  Negative for difficulty urinating and dysuria.  Musculoskeletal:  Negative for joint swelling and myalgias.  Skin:  Negative for color change and rash.  Neurological:  Negative for dizziness, light-headedness and headaches.  Psychiatric/Behavioral:  Negative for agitation and dysphoric mood.        Objective:      BP (!) 140/78 (BP Location: Left Arm, Patient Position: Sitting, Cuff Size: Large)   Pulse 92   Temp 98.6 F (37 C) (Temporal)   Resp 17   Ht 5' 4" (1.626 m)   Wt 247 lb 9.6 oz (112.3 kg)   SpO2 98%   BMI 42.50 kg/m  Wt Readings from Last 3 Encounters:  07/08/22 247 lb 9.6 oz (112.3 kg)  12/30/21 244 lb (110.7 kg)  01/18/21 239 lb (108.4 kg)    Physical Exam Vitals reviewed.  Constitutional:      General: She is not in acute distress.    Appearance: Normal appearance.  HENT:     Head: Normocephalic and atraumatic.     Right Ear: External ear normal.     Left Ear: External ear normal.  Eyes:     General: No scleral icterus.       Right eye: No discharge.        Left eye: No discharge.     Conjunctiva/sclera: Conjunctivae normal.  Neck:     Thyroid: No thyromegaly.  Cardiovascular:     Rate and Rhythm: Normal rate and regular rhythm.  Pulmonary:     Effort: No respiratory distress.     Breath sounds: Normal breath sounds. No wheezing.  Abdominal:     General: Bowel sounds are normal.     Palpations: Abdomen is soft.     Tenderness: There is no abdominal tenderness.  Musculoskeletal:        General: No swelling or tenderness.     Cervical back: Neck supple. No tenderness.  Lymphadenopathy:     Cervical: No cervical adenopathy.  Skin:    Findings: No erythema or rash.  Neurological:     Mental Status: She is alert.  Psychiatric:        Mood and Affect: Mood normal.        Behavior: Behavior normal.      Outpatient Encounter Medications as of 07/08/2022  Medication Sig   amLODipine (NORVASC) 10 MG tablet TAKE 1 TABLET BY MOUTH  DAILY   BAYER MICROLET LANCETS lancets USE AS DIRECTED TO CHECK  SUGARS 1 TO 2 TIMES A DAY   cholecalciferol (VITAMIN D3) 25 MCG (1000 UT) tablet Take 1,000 Units by mouth daily.   CONTOUR TEST test strip USE TO CHECK BLOOD GLUCOSE  6 TO 7 TIMES DAILY   Cyanocobalamin (B-12 PO) Take by mouth.   ezetimibe (ZETIA) 10 MG tablet TAKE  1 TABLET BY MOUTH  DAILY   hydrochlorothiazide (HYDRODIURIL) 25 MG tablet TAKE 1 TABLET BY MOUTH  DAILY   levocetirizine (XYZAL) 5 MG tablet TAKE 1 TABLET BY MOUTH IN  THE EVENING   lisinopril (ZESTRIL)  40 MG tablet TAKE 1 TABLET BY MOUTH  DAILY   ondansetron (ZOFRAN ODT) 4 MG disintegrating tablet Take 1 tablet (4 mg total) by mouth 2 (two) times daily as needed for nausea or vomiting.   sertraline (ZOLOFT) 50 MG tablet TAKE 1 TABLET BY MOUTH  DAILY   [DISCONTINUED] insulin lispro (HUMALOG KWIKPEN) 100 UNIT/ML KwikPen INJECT SUBCUTANEOUSLY 50  UNITS DAILY   [DISCONTINUED] Insulin Pen Needle 32G X 4 MM MISC Use as directed with insulin   [DISCONTINUED] LANTUS SOLOSTAR 100 UNIT/ML Solostar Pen INJECT SUBCUTANEOUSLY 50  UNITS AT NIGHT (Patient taking differently: INJECT SUBCUTANEOUSLY 80  UNITS AT NIGHT)   insulin glargine (LANTUS SOLOSTAR) 100 UNIT/ML Solostar Pen INJECT SUBCUTANEOUSLY 80  UNITS AT NIGHT   insulin lispro (HUMALOG KWIKPEN) 100 UNIT/ML KwikPen INJECT SUBCUTANEOUSLY 50  UNITS DAILY   [DISCONTINUED] Insulin Pen Needle 32G X 4 MM MISC Use as directed with insulin   No facility-administered encounter medications on file as of 07/08/2022.     Lab Results  Component Value Date   WBC 10.0 09/18/2021   HGB 14.7 09/18/2021   HCT 45.1 09/18/2021   PLT 253 09/18/2021   GLUCOSE 95 03/24/2022   CHOL 229 (H) 03/24/2022   TRIG 304 (H) 03/24/2022   HDL 43 03/24/2022   LDLCALC 132 (H) 03/24/2022   ALT 16 03/24/2022   AST 17 03/24/2022   NA 139 03/24/2022   K 4.3 03/24/2022   CL 97 03/24/2022   CREATININE 1.00 03/24/2022   BUN 14 03/24/2022   CO2 21 03/24/2022   TSH 1.750 09/18/2021   HGBA1C 7.1 (A) 07/08/2022    MM DIAG BREAST TOMO BILATERAL  Result Date: 01/29/2022 CLINICAL DATA:  61 year old female presenting for annual exam as well as late follow-up of a left breast biopsy demonstrating hemangioma in June 2021. EXAM: DIGITAL DIAGNOSTIC BILATERAL MAMMOGRAM WITH TOMOSYNTHESIS AND  CAD TECHNIQUE: Bilateral digital diagnostic mammography and breast tomosynthesis was performed. The images were evaluated with computer-aided detection. COMPARISON:  Previous exam(s). ACR Breast Density Category b: There are scattered areas of fibroglandular density. FINDINGS: Right breast: No suspicious mass, distortion, or microcalcifications are identified to suggest presence of malignancy. Left breast: There is a ribbon shaped biopsy marking clip at the site of prior biopsy demonstrating hemangioma in the superior central left breast. There is a small mass with post biopsy change. No new suspicious findings at the biopsy site or elsewhere in the left breast to suggest the presence of malignancy. IMPRESSION: No mammographic evidence of malignancy bilaterally. RECOMMENDATION: Screening mammogram in one year.(Code:SM-B-01Y) I have discussed the findings and recommendations with the patient. If applicable, a reminder letter will be sent to the patient regarding the next appointment. BI-RADS CATEGORY  2: Benign. Electronically Signed   By: Audie Pinto M.D.   On: 01/29/2022 14:33       Assessment & Plan:   Problem List Items Addressed This Visit     Anxiety    Continue zoloft.  Appears to be stable.  Follow.       Essential hypertension    On amlodipine and lisinopril.  Spot check pressures.  Follow pressures and metabolic panel.  Continue current medication regimen. Blood pressure as outlined.       Hypercholesterolemia    On zetia. Did not tolerate statin medication.  Low cholesterol diet and exercise.  Follow lipid panel.       Personal history of colonic polyps    Colonoscopy 01/2021 - normal (Dr Allen Norris).  Recommended f/u  colonoscopy in 5 years.       Type 1 diabetes mellitus with hyperglycemia (HCC) - Primary    Low carb diet and exercise.  Sugars as outlined.  Has adjusted her insulin.  Taking lantus bid.  Sugars improved.  Follow met b and a1c.       Relevant Medications    insulin lispro (HUMALOG KWIKPEN) 100 UNIT/ML KwikPen   insulin glargine (LANTUS SOLOSTAR) 100 UNIT/ML Solostar Pen   Other Relevant Orders   POCT HgB A1C (Completed)     Einar Pheasant, MD

## 2022-07-14 LAB — HM DIABETES EYE EXAM

## 2022-07-18 ENCOUNTER — Other Ambulatory Visit: Payer: Self-pay

## 2022-07-18 MED ORDER — INSULIN PEN NEEDLE 32G X 4 MM MISC
3 refills | Status: DC
Start: 2022-07-18 — End: 2023-07-15

## 2022-07-19 ENCOUNTER — Telehealth: Payer: Self-pay | Admitting: Internal Medicine

## 2022-07-19 ENCOUNTER — Encounter: Payer: Self-pay | Admitting: Internal Medicine

## 2022-07-19 NOTE — Assessment & Plan Note (Signed)
On amlodipine and lisinopril.  Spot check pressures.  Follow pressures and metabolic panel.  Continue current medication regimen. Blood pressure as outlined.  

## 2022-07-19 NOTE — Telephone Encounter (Signed)
Orders have been placed for [facility] labs (previously placed).  Please call and see how blood pressures are dong and also, make sure she is going to get labs drawn.  (Needs labs).

## 2022-07-19 NOTE — Assessment & Plan Note (Signed)
Continue zoloft.  Appears to be stable.  Follow.   °

## 2022-07-19 NOTE — Assessment & Plan Note (Signed)
On zetia. Did not tolerate statin medication.  Low cholesterol diet and exercise.  Follow lipid panel.

## 2022-07-19 NOTE — Assessment & Plan Note (Signed)
Colonoscopy 01/2021 - normal (Dr Wohl).  Recommended f/u colonoscopy in 5 years.  

## 2022-07-19 NOTE — Assessment & Plan Note (Signed)
Low carb diet and exercise.  Sugars as outlined.  Has adjusted her insulin.  Taking lantus bid.  Sugars improved.  Follow met b and a1c.  

## 2022-07-21 NOTE — Telephone Encounter (Signed)
FYI patient stated that she planning to go to [facility] to have labs drawn & just wanted to make sure orders were in. She also had no BP readings bc she had not been any place to purchase a BP cuff she stated. She said that she would try to do so soon.

## 2022-08-20 ENCOUNTER — Telehealth: Payer: Self-pay

## 2022-08-20 NOTE — Telephone Encounter (Signed)
I have faxed back.

## 2022-08-20 NOTE — Telephone Encounter (Signed)
Form signed and placed in box.   

## 2022-08-20 NOTE — Telephone Encounter (Signed)
Patient call office back she is checking her blood sugars 8 to 10 times a day and is injecting insulin.

## 2022-08-20 NOTE — Telephone Encounter (Signed)
LMTCB to see how many times a day patient is checking blood sugars & injecting insulin.

## 2022-08-20 NOTE — Telephone Encounter (Signed)
This was a question from [facility] on ho  many times patient was using pen needles & I am pretty sure patient answered with how many times a day she is testing her blood sugars. Insulin is twice a day then blood sugars she is testing 8-10 times.

## 2022-08-21 ENCOUNTER — Telehealth: Payer: Self-pay

## 2022-08-21 NOTE — Telephone Encounter (Signed)
Called [facility] and spoke to pharmacist and gave the clarification over the phone that patient is to be taking the medication 6x/day per Dr. Nicki Reaper! Pharmacist informed me that they would make the change and correct it!

## 2022-08-21 NOTE — Telephone Encounter (Signed)
Luz Lex called from [place].  Luz Lex states there is a discrepancy between the frequency for the Insulin Pen Needle 32G X 4 MM MISC, the patient states they are taking the medication (6 times/day) and the information Dr. Einar Pheasant gave them was six or more times/day.    Reference #[id number]

## 2022-08-21 NOTE — Telephone Encounter (Signed)
Notify - 6x / day

## 2022-08-31 ENCOUNTER — Other Ambulatory Visit: Payer: Self-pay | Admitting: Internal Medicine

## 2022-09-17 ENCOUNTER — Other Ambulatory Visit: Payer: Self-pay | Admitting: Internal Medicine

## 2022-10-06 NOTE — Telephone Encounter (Signed)
Pt called sating she would like her lab work done at [facility] for her physical

## 2022-10-06 NOTE — Telephone Encounter (Signed)
Pt has orders pending at [facility] for labs. Pt advised should already be there, any issues let me know

## 2022-10-07 LAB — HEPATIC FUNCTION PANEL
ALT: 19 IU/L (ref 0–32)
AST: 17 IU/L (ref 0–40)
Albumin: 4.6 g/dL (ref 3.9–4.9)
Alkaline Phosphatase: 62 IU/L (ref 44–121)
Bilirubin Total: 0.3 mg/dL (ref 0.0–1.2)
Bilirubin, Direct: <0.1 mg/dL (ref 0.00–0.40)
Total Protein: 7.4 g/dL (ref 6.0–8.5)

## 2022-10-07 LAB — BASIC METABOLIC PANEL
BUN/Creatinine Ratio: 11 — ABNORMAL LOW (ref 12–28)
BUN: 11 mg/dL (ref 8–27)
CO2: 22 mmol/L (ref 20–29)
Calcium: 10.7 mg/dL — ABNORMAL HIGH (ref 8.7–10.3)
Chloride: 98 mmol/L (ref 96–106)
Creatinine, Ser: 0.99 mg/dL (ref 0.57–1.00)
Glucose: 58 mg/dL — ABNORMAL LOW (ref 70–99)
Potassium: 4 mmol/L (ref 3.5–5.2)
Sodium: 138 mmol/L (ref 134–144)
eGFR: 64 mL/min/{1.73_m2} (ref >59)

## 2022-10-07 LAB — LIPID PANEL
Chol/HDL Ratio: 6 ratio — ABNORMAL HIGH (ref 0.0–4.4)
Cholesterol, Total: 251 mg/dL — ABNORMAL HIGH (ref 100–199)
HDL: 42 mg/dL (ref >39)
LDL Chol Calc (NIH): 141 mg/dL — ABNORMAL HIGH (ref 0–99)
Triglycerides: 374 mg/dL — ABNORMAL HIGH (ref 0–149)
VLDL Cholesterol Cal: 68 mg/dL — ABNORMAL HIGH (ref 5–40)

## 2022-10-07 LAB — HEMOGLOBIN A1C
Est. average glucose Bld gHb Est-mCnc: 154 mg/dL
Hgb A1c MFr Bld: 7 % — ABNORMAL HIGH (ref 4.8–5.6)

## 2022-10-07 LAB — VITAMIN D 25 HYDROXY (VIT D DEFICIENCY, FRACTURES): Vit D, 25-Hydroxy: 28 ng/mL — ABNORMAL LOW (ref 30.0–100.0)

## 2022-10-09 ENCOUNTER — Ambulatory Visit (INDEPENDENT_AMBULATORY_CARE_PROVIDER_SITE_OTHER): Payer: Managed Care, Other (non HMO) | Admitting: Internal Medicine

## 2022-10-09 ENCOUNTER — Encounter: Payer: Self-pay | Admitting: Internal Medicine

## 2022-10-09 VITALS — BP 140/70 | HR 83 | Temp 98.9°F | Resp 17 | Ht 64.0 in | Wt 248.0 lb

## 2022-10-09 DIAGNOSIS — E1065 Type 1 diabetes mellitus with hyperglycemia: Secondary | ICD-10-CM

## 2022-10-09 DIAGNOSIS — E7800 Pure hypercholesterolemia, unspecified: Secondary | ICD-10-CM

## 2022-10-09 DIAGNOSIS — I10 Essential (primary) hypertension: Secondary | ICD-10-CM | POA: Diagnosis not present

## 2022-10-09 DIAGNOSIS — Z23 Encounter for immunization: Secondary | ICD-10-CM

## 2022-10-09 DIAGNOSIS — E559 Vitamin D deficiency, unspecified: Secondary | ICD-10-CM

## 2022-10-09 DIAGNOSIS — F419 Anxiety disorder, unspecified: Secondary | ICD-10-CM | POA: Diagnosis not present

## 2022-10-09 DIAGNOSIS — Z0000 Encounter for general adult medical examination without abnormal findings: Secondary | ICD-10-CM

## 2022-10-09 MED ORDER — REPATHA SURECLICK 140 MG/ML ~~LOC~~ SOAJ: 140.0000 mg | SUBCUTANEOUS | 2 refills | Status: DC

## 2022-10-09 NOTE — Assessment & Plan Note (Addendum)
Physical today 10/09/22.  Mammogram 01/29/22 - Birads II.  Colonoscopy 01/2021 (Dr Servando Snare).  Recommended f/u colonoscopy in 5 years   Requested pap next visit.  Wanted to hold today.

## 2022-10-09 NOTE — Progress Notes (Signed)
Patient ID: Jill Crosby, female   DOB: 11/29/1961, 61 y.o.   MRN: [id number]   Subjective:    Patient ID: Jill Crosby, female    DOB: [birth year], 61 y.o.   MRN: [id number]   Patient here for  Chief Complaint  Patient presents with   Annual Exam    CPE   .   HPI Reports she is doing well.  Feels good.  Discussed staying active.  No chest pain or sob reported.  No cough or congestion.  No acid reflux, abdominal pain or bowel change reported.  States blood pressures 120s-140/68-80 (most low 130s).  Discussed pap smear.  Wants to have pap at next visit.  Discussed labs.  Discussed repatha.     Past Medical History:  Diagnosis Date   Anxiety    Depression    Elevated BP    Hypercholesterolemia    Increased BMI    Menopause    Morbid obesity ([place])    Type I diabetes mellitus ([place])    Past Surgical History:  Procedure Laterality Date   BREAST BIOPSY Left 05/09/2020   ribbon clip, stereo bx, HEMANGIOMA, BENIGN. - MAMMARY EPITHELIUM IS NOT PRESENT.   CATARACT EXTRACTION W/PHACO Left 11/08/2020   Procedure: CATARACT EXTRACTION PHACO AND INTRAOCULAR LENS PLACEMENT (IOC) LEFT DIABETIC 4.14 00:52.1 ;  Surgeon: Birder Robson, MD;  Location: [place];  Service: Ophthalmology;  Laterality: Left;   CATARACT EXTRACTION W/PHACO Right 12/04/2020   Procedure: CATARACT EXTRACTION PHACO AND INTRAOCULAR LENS PLACEMENT (IOC) RIGHT DIABETIC 6.11 00:46.1;  Surgeon: Birder Robson, MD;  Location: [place];  Service: Ophthalmology;  Laterality: Right;  Diabetic - insulin   CESAREAN SECTION  1997   COLONOSCOPY WITH PROPOFOL N/A 01/18/2021   Procedure: COLONOSCOPY WITH PROPOFOL;  Surgeon: Lucilla Lame, MD;  Location: [place];  Service: Endoscopy;  Laterality: N/A;  priority 4   Family History  Problem Relation Age of Onset   Hypertension Mother    Hyperlipidemia Mother    Colon polyps Mother    Cancer Father        lung   Colon cancer Maternal  Grandfather    Diabetes Maternal Grandfather    Breast cancer Maternal Grandmother    Colon cancer Paternal Grandfather    Breast cancer Maternal Aunt    Heart disease Neg Hx    Ovarian cancer Neg Hx    Social History   Socioeconomic History   Marital status: Single    Spouse name: Not on file   Number of children: Not on file   Years of education: Not on file   Highest education level: Not on file  Occupational History   Not on file  Tobacco Use   Smoking status: Former    Packs/day: 0.50    Years: 20.00    Total pack years: 10.00    Types: Cigarettes   Smokeless tobacco: Never  Substance and Sexual Activity   Alcohol use: No    Alcohol/week: 0.0 standard drinks of alcohol   Drug use: No   Sexual activity: Not Currently    Birth control/protection: Post-menopausal  Other Topics Concern   Not on file  Social History Narrative   Not on file   Social Determinants of Health   Financial Resource Strain: Not on file  Food Insecurity: Not on file  Transportation Needs: Not on file  Physical Activity: Not on file  Stress: Not on file  Social Connections: Not on file     Review  of Systems  Constitutional:  Negative for appetite change and unexpected weight change.  HENT:  Negative for congestion, sinus pressure and sore throat.   Eyes:  Negative for pain and visual disturbance.  Respiratory:  Negative for cough, chest tightness and shortness of breath.   Cardiovascular:  Negative for chest pain, palpitations and leg swelling.  Gastrointestinal:  Negative for abdominal pain, diarrhea, nausea and vomiting.  Genitourinary:  Negative for difficulty urinating and dysuria.  Musculoskeletal:  Negative for joint swelling and myalgias.  Skin:  Negative for color change and rash.  Neurological:  Negative for dizziness, light-headedness and headaches.  Hematological:  Negative for adenopathy. Does not bruise/bleed easily.  Psychiatric/Behavioral:  Negative for agitation and  dysphoric mood.        Objective:     BP (!) 140/70 (BP Location: Left Arm, Patient Position: Sitting, Cuff Size: Large)   Pulse 83   Temp 98.9 F (37.2 C) (Temporal)   Resp 17   Ht _0  (1.626 m)   Wt 248 lb (112.5 kg)   SpO2 96%   BMI 42.57 kg/m  Wt Readings from Last 3 Encounters:  10/09/22 248 lb (112.5 kg)  07/08/22 247 lb 9.6 oz (112.3 kg)  12/30/21 244 lb (110.7 kg)    Physical Exam Vitals reviewed.  Constitutional:      General: She is not in acute distress.    Appearance: Normal appearance. She is well-developed.  HENT:     Head: Normocephalic and atraumatic.     Right Ear: External ear normal.     Left Ear: External ear normal.  Eyes:     General: No scleral icterus.       Right eye: No discharge.        Left eye: No discharge.     Conjunctiva/sclera: Conjunctivae normal.  Neck:     Thyroid: No thyromegaly.  Cardiovascular:     Rate and Rhythm: Normal rate and regular rhythm.  Pulmonary:     Effort: No tachypnea, accessory muscle usage or respiratory distress.     Breath sounds: Normal breath sounds. No decreased breath sounds or wheezing.  Chest:  Breasts:    Right: No inverted nipple, mass, nipple discharge or tenderness (no axillary adenopathy).     Left: No inverted nipple, mass, nipple discharge or tenderness (no axilarry adenopathy).  Abdominal:     General: Bowel sounds are normal.     Palpations: Abdomen is soft.     Tenderness: There is no abdominal tenderness.  Musculoskeletal:        General: No swelling or tenderness.     Cervical back: Neck supple.  Lymphadenopathy:     Cervical: No cervical adenopathy.  Skin:    Findings: No erythema or rash.  Neurological:     Mental Status: She is alert and oriented to person, place, and time.  Psychiatric:        Mood and Affect: Mood normal.        Behavior: Behavior normal.      Outpatient Encounter Medications as of 10/09/2022  Medication Sig   amLODipine (NORVASC) 10 MG tablet TAKE 1  TABLET BY MOUTH DAILY   BAYER MICROLET LANCETS lancets USE AS DIRECTED TO CHECK  SUGARS 1 TO 2 TIMES A DAY   cholecalciferol (VITAMIN D3) 25 MCG (1000 UT) tablet Take 1,000 Units by mouth daily.   CONTOUR TEST test strip USE TO CHECK BLOOD GLUCOSE 6 TO  7 TIMES DAILY   Cyanocobalamin (B-12 PO) Take by  mouth.   Evolocumab (REPATHA SURECLICK) 979 MG/ML SOAJ Inject 140 mg into the skin every 14 (fourteen) days.   ezetimibe (ZETIA) 10 MG tablet TAKE 1 TABLET BY MOUTH  DAILY   hydrochlorothiazide (HYDRODIURIL) 25 MG tablet TAKE 1 TABLET BY MOUTH  DAILY   insulin glargine (LANTUS SOLOSTAR) 100 UNIT/ML Solostar Pen INJECT SUBCUTANEOUSLY 80  UNITS AT NIGHT   insulin lispro (HUMALOG KWIKPEN) 100 UNIT/ML KwikPen INJECT SUBCUTANEOUSLY 50  UNITS DAILY   Insulin Pen Needle 32G X 4 MM MISC Use as directed with insulin 4 x daily.   levocetirizine (XYZAL) 5 MG tablet TAKE 1 TABLET BY MOUTH IN  THE EVENING   lisinopril (ZESTRIL) 40 MG tablet TAKE 1 TABLET BY MOUTH  DAILY   ondansetron (ZOFRAN ODT) 4 MG disintegrating tablet Take 1 tablet (4 mg total) by mouth 2 (two) times daily as needed for nausea or vomiting.   sertraline (ZOLOFT) 50 MG tablet TAKE 1 TABLET BY MOUTH  DAILY   No facility-administered encounter medications on file as of 10/09/2022.     Lab Results  Component Value Date   WBC 10.0 09/18/2021   HGB 14.7 09/18/2021   HCT 45.1 09/18/2021   PLT 253 09/18/2021   GLUCOSE 58 (L) 10/06/2022   CHOL 251 (H) 10/06/2022   TRIG 374 (H) 10/06/2022   HDL 42 10/06/2022   LDLCALC 141 (H) 10/06/2022   ALT 19 10/06/2022   AST 17 10/06/2022   NA 138 10/06/2022   K 4.0 10/06/2022   CL 98 10/06/2022   CREATININE 0.99 10/06/2022   BUN 11 10/06/2022   CO2 22 10/06/2022   TSH 1.750 09/18/2021   HGBA1C 7.0 (H) 10/06/2022    MM DIAG BREAST TOMO BILATERAL  Result Date: 01/29/2022 CLINICAL DATA:  61 year old female presenting for annual exam as well as late follow-up of a left breast biopsy  demonstrating hemangioma in June 2021. EXAM: DIGITAL DIAGNOSTIC BILATERAL MAMMOGRAM WITH TOMOSYNTHESIS AND CAD TECHNIQUE: Bilateral digital diagnostic mammography and breast tomosynthesis was performed. The images were evaluated with computer-aided detection. COMPARISON:  Previous exam(s). ACR Breast Density Category b: There are scattered areas of fibroglandular density. FINDINGS: Right breast: No suspicious mass, distortion, or microcalcifications are identified to suggest presence of malignancy. Left breast: There is a ribbon shaped biopsy marking clip at the site of prior biopsy demonstrating hemangioma in the superior central left breast. There is a small mass with post biopsy change. No new suspicious findings at the biopsy site or elsewhere in the left breast to suggest the presence of malignancy. IMPRESSION: No mammographic evidence of malignancy bilaterally. RECOMMENDATION: Screening mammogram in one year.(Code:SM-B-01Y) I have discussed the findings and recommendations with the patient. If applicable, a reminder letter will be sent to the patient regarding the next appointment. BI-RADS CATEGORY  2: Benign. Electronically Signed   By: Audie Pinto M.D.   On: 01/29/2022 14:33       Assessment & Plan:   Problem List Items Addressed This Visit     Anxiety    Continue zoloft.  Appears to be stable.  Follow.       Essential hypertension    On amlodipine and lisinopril.  Spot check pressures.  Follow pressures and metabolic panel.  Continue current medication regimen. Blood pressure as outlined.       Relevant Medications   Evolocumab (REPATHA SURECLICK) 892 MG/ML Jill Crosby    Physical today 10/09/22.  Mammogram 01/29/22 - Birads II.  Colonoscopy 01/2021 (Dr Allen Norris).  Recommended f/u colonoscopy in 5 years   Requested pap next visit.  Wanted to hold today.       Hypercholesterolemia    On zetia. Did not tolerate statin medication.  Low cholesterol diet and exercise.   Follow lipid panel.  Discussed repatha.  Agreeable.       Relevant Medications   Evolocumab (REPATHA SURECLICK) 239 MG/ML SOAJ   Other Relevant Orders   Hepatic function panel   Type 1 diabetes mellitus with hyperglycemia (HCC)    Low carb diet and exercise.  Sugars as outlined.  Has adjusted her insulin.  Taking lantus bid.  Sugars improved.  Follow met b and a1c.       Vitamin D deficiency    Vitamin D supplements.       Other Visit Diagnoses     Need for influenza vaccination       Relevant Orders   Flu Vaccine QUAD 6+ mos PF IM (Fluarix Quad PF)   Need for immunization against influenza       Relevant Orders   Flu Vaccine QUAD 84moIM (Fluarix, Fluzone & Alfiuria Quad PF) (Completed)   Hypercalcemia       Relevant Orders   Calcium        CEinar Pheasant MD

## 2022-10-10 ENCOUNTER — Telehealth: Payer: Self-pay

## 2022-10-10 NOTE — Telephone Encounter (Signed)
Jill Crosby (Key: [id number]) Rx #: [id number] Repatha SureClick 140MG /ML auto-injectors   Form [facility] Electronic Prior Authorization Form (2017 NCP[employer]) Created 20 hours ago Sent to Plan 4 hours ago Plan Response 4 hours ago Submit Clinical Questions 4 hours ago Determination Unfavorable 3 hours ago eAppeal Submitted eAppeal Determination Your prior authorization request has been denied.

## 2022-10-10 NOTE — Telephone Encounter (Signed)
Jill Crosby (Key: [id number]) Rx #: [id number] Repatha SureClick 140MG /ML auto-injectors   Form [facility] Electronic Prior Authorization Form (2017 NCPDP) Created 16 hours ago Sent to Plan 2 minutes ago Plan Response 2 minutes ago Submit Clinical Questions less than a minute ago Determination Wait for Determination Please wait for [facility] 2017 NCPDP to return a determination.

## 2022-10-10 NOTE — Telephone Encounter (Signed)
eAppeal initiated

## 2022-10-19 ENCOUNTER — Encounter: Payer: Self-pay | Admitting: Internal Medicine

## 2022-10-19 NOTE — Assessment & Plan Note (Signed)
On amlodipine and lisinopril.  Spot check pressures.  Follow pressures and metabolic panel.  Continue current medication regimen. Blood pressure as outlined.

## 2022-10-19 NOTE — Assessment & Plan Note (Signed)
On zetia. Did not tolerate statin medication.  Low cholesterol diet and exercise.  Follow lipid panel.  Discussed repatha.  Agreeable.

## 2022-10-19 NOTE — Assessment & Plan Note (Signed)
Low carb diet and exercise.  Sugars as outlined.  Has adjusted her insulin.  Taking lantus bid.  Sugars improved.  Follow met b and a1c.  

## 2022-10-19 NOTE — Assessment & Plan Note (Signed)
Vitamin D supplements.  

## 2022-10-19 NOTE — Assessment & Plan Note (Signed)
Continue zoloft.  Appears to be stable.  Follow.   °

## 2022-10-20 ENCOUNTER — Other Ambulatory Visit (INDEPENDENT_AMBULATORY_CARE_PROVIDER_SITE_OTHER): Payer: Managed Care, Other (non HMO)

## 2022-10-20 DIAGNOSIS — I10 Essential (primary) hypertension: Secondary | ICD-10-CM

## 2022-10-20 DIAGNOSIS — E1065 Type 1 diabetes mellitus with hyperglycemia: Secondary | ICD-10-CM

## 2022-10-20 DIAGNOSIS — E7800 Pure hypercholesterolemia, unspecified: Secondary | ICD-10-CM

## 2022-10-21 LAB — BASIC METABOLIC PANEL
BUN/Creatinine Ratio: 12 (ref 12–28)
BUN: 11 mg/dL (ref 8–27)
CO2: 23 mmol/L (ref 20–29)
Calcium: 10.5 mg/dL — ABNORMAL HIGH (ref 8.7–10.3)
Chloride: 98 mmol/L (ref 96–106)
Creatinine, Ser: 0.93 mg/dL (ref 0.57–1.00)
Glucose: 80 mg/dL (ref 70–99)
Potassium: 4 mmol/L (ref 3.5–5.2)
Sodium: 138 mmol/L (ref 134–144)
eGFR: 69 mL/min/{1.73_m2} (ref >59)

## 2022-10-24 ENCOUNTER — Other Ambulatory Visit: Payer: Self-pay | Admitting: Internal Medicine

## 2022-10-30 NOTE — Telephone Encounter (Signed)
[employer] called back and they stated the medication was approved a few days ago Case number-[id number] Phone number-[phone]

## 2022-10-30 NOTE — Telephone Encounter (Signed)
Ok. To start.

## 2022-10-31 NOTE — Telephone Encounter (Signed)
I called the pharmacy and let them know that the patient was approved for the repatha and they stated that they have to order the medication and they would work on it and it would be $60 I called and informed the patient of this and she understood, patient stated she would bring the medication with her on her visit on 11/17/2022 for the provider or nurse to show her how to administer the medication.  Albertina Leise,cma

## 2022-11-17 ENCOUNTER — Encounter: Payer: Self-pay | Admitting: Internal Medicine

## 2022-11-17 ENCOUNTER — Telehealth: Payer: Managed Care, Other (non HMO) | Admitting: Internal Medicine

## 2022-11-17 VITALS — BP 130/70 | Ht 64.0 in | Wt 248.0 lb

## 2022-11-17 DIAGNOSIS — I10 Essential (primary) hypertension: Secondary | ICD-10-CM

## 2022-11-17 DIAGNOSIS — Z86010 Personal history of colonic polyps: Secondary | ICD-10-CM

## 2022-11-17 DIAGNOSIS — E7800 Pure hypercholesterolemia, unspecified: Secondary | ICD-10-CM | POA: Diagnosis not present

## 2022-11-17 DIAGNOSIS — F419 Anxiety disorder, unspecified: Secondary | ICD-10-CM

## 2022-11-17 DIAGNOSIS — E1065 Type 1 diabetes mellitus with hyperglycemia: Secondary | ICD-10-CM

## 2022-11-17 NOTE — Progress Notes (Signed)
Patient ID: Jill Crosby, female   DOB: 27-Mar-1961, 61 y.o.   MRN: [id number]   Subjective:    Patient ID: Jill Crosby, female    DOB: [birth year], 61 y.o.   MRN: [id number]  Patient here for  Chief Complaint  Patient presents with   Medication Problem    Discuss stopping hctz and discuss calcium levels    HPI Recent labs - elevated calcium.  Discussed possible etiologies.  Discussed possible HCTZ contributing.  Blood pressures 126-134/68-70.  Tries to stay active.  No chest pain or sob reported.  No abdominal pain or bowel change reported.  Blood sugars 96 this am and 99 today. Discussed cholesterol - repatha treatment.    Past Medical History:  Diagnosis Date   Anxiety    Depression    Elevated BP    Hypercholesterolemia    Increased BMI    Menopause    Morbid obesity ([place])    Type I diabetes mellitus ([place])    Past Surgical History:  Procedure Laterality Date   BREAST BIOPSY Left 05/09/2020   ribbon clip, stereo bx, HEMANGIOMA, BENIGN. - MAMMARY EPITHELIUM IS NOT PRESENT.   CATARACT EXTRACTION W/PHACO Left 11/08/2020   Procedure: CATARACT EXTRACTION PHACO AND INTRAOCULAR LENS PLACEMENT (IOC) LEFT DIABETIC 4.14 00:52.1 ;  Surgeon: Birder Robson, MD;  Location: [place];  Service: Ophthalmology;  Laterality: Left;   CATARACT EXTRACTION W/PHACO Right 12/04/2020   Procedure: CATARACT EXTRACTION PHACO AND INTRAOCULAR LENS PLACEMENT (IOC) RIGHT DIABETIC 6.11 00:46.1;  Surgeon: Birder Robson, MD;  Location: [place];  Service: Ophthalmology;  Laterality: Right;  Diabetic - insulin   CESAREAN SECTION  1997   COLONOSCOPY WITH PROPOFOL N/A 01/18/2021   Procedure: COLONOSCOPY WITH PROPOFOL;  Surgeon: Lucilla Lame, MD;  Location: [place];  Service: Endoscopy;  Laterality: N/A;  priority 4   Family History  Problem Relation Age of Onset   Hypertension Mother    Hyperlipidemia Mother    Colon polyps Mother    Cancer Father        lung    Colon cancer Maternal Grandfather    Diabetes Maternal Grandfather    Breast cancer Maternal Grandmother    Colon cancer Paternal Grandfather    Breast cancer Maternal Aunt    Heart disease Neg Hx    Ovarian cancer Neg Hx    Social History   Socioeconomic History   Marital status: Single    Spouse name: Not on file   Number of children: Not on file   Years of education: Not on file   Highest education level: Not on file  Occupational History   Not on file  Tobacco Use   Smoking status: Former    Packs/day: 0.50    Years: 20.00    Total pack years: 10.00    Types: Cigarettes   Smokeless tobacco: Never  Substance and Sexual Activity   Alcohol use: No    Alcohol/week: 0.0 standard drinks of alcohol   Drug use: No   Sexual activity: Not Currently    Birth control/protection: Post-menopausal  Other Topics Concern   Not on file  Social History Narrative   Not on file   Social Determinants of Health   Financial Resource Strain: Not on file  Food Insecurity: Not on file  Transportation Needs: Not on file  Physical Activity: Not on file  Stress: Not on file  Social Connections: Not on file     Review of Systems  Constitutional:  Negative  for appetite change and unexpected weight change.  HENT:  Negative for congestion and sinus pressure.   Respiratory:  Negative for cough, chest tightness and shortness of breath.   Cardiovascular:  Negative for chest pain, palpitations and leg swelling.  Gastrointestinal:  Negative for abdominal pain, diarrhea, nausea and vomiting.  Genitourinary:  Negative for difficulty urinating and dysuria.  Musculoskeletal:  Negative for joint swelling and myalgias.  Skin:  Negative for color change and rash.  Neurological:  Negative for dizziness, light-headedness and headaches.  Psychiatric/Behavioral:  Negative for agitation and dysphoric mood.        Objective:     BP 130/70   Ht 5' 4" (1.626 m)   Wt 248 lb (112.5 kg)   BMI 42.57  kg/m  Wt Readings from Last 3 Encounters:  11/17/22 248 lb (112.5 kg)  10/09/22 248 lb (112.5 kg)  07/08/22 247 lb 9.6 oz (112.3 kg)    Physical Exam Vitals reviewed.  Constitutional:      General: She is not in acute distress.    Appearance: Normal appearance.  HENT:     Head: Normocephalic and atraumatic.     Right Ear: External ear normal.     Left Ear: External ear normal.  Eyes:     General: No scleral icterus.       Right eye: No discharge.        Left eye: No discharge.     Conjunctiva/sclera: Conjunctivae normal.  Neck:     Thyroid: No thyromegaly.  Cardiovascular:     Rate and Rhythm: Normal rate and regular rhythm.  Pulmonary:     Effort: No respiratory distress.     Breath sounds: Normal breath sounds. No wheezing.  Abdominal:     General: Bowel sounds are normal.     Palpations: Abdomen is soft.     Tenderness: There is no abdominal tenderness.  Musculoskeletal:        General: No swelling or tenderness.     Cervical back: Neck supple. No tenderness.  Lymphadenopathy:     Cervical: No cervical adenopathy.  Skin:    Findings: No erythema or rash.  Neurological:     Mental Status: She is alert.  Psychiatric:        Mood and Affect: Mood normal.        Behavior: Behavior normal.      Outpatient Encounter Medications as of 11/17/2022  Medication Sig   amLODipine (NORVASC) 10 MG tablet TAKE 1 TABLET BY MOUTH DAILY   BAYER MICROLET LANCETS lancets USE AS DIRECTED TO CHECK  SUGARS 1 TO 2 TIMES A DAY   cholecalciferol (VITAMIN D3) 25 MCG (1000 UT) tablet Take 1,000 Units by mouth daily.   CONTOUR TEST test strip USE TO CHECK BLOOD GLUCOSE 6 TO  7 TIMES DAILY   Cyanocobalamin (B-12 PO) Take by mouth.   ezetimibe (ZETIA) 10 MG tablet TAKE 1 TABLET BY MOUTH  DAILY   hydrochlorothiazide (HYDRODIURIL) 25 MG tablet TAKE 1 TABLET BY MOUTH  DAILY   insulin glargine (LANTUS SOLOSTAR) 100 UNIT/ML Solostar Pen INJECT SUBCUTANEOUSLY 80  UNITS AT NIGHT   insulin  lispro (HUMALOG KWIKPEN) 100 UNIT/ML KwikPen INJECT SUBCUTANEOUSLY 50  UNITS DAILY   Insulin Pen Needle 32G X 4 MM MISC Use as directed with insulin 4 x daily.   levocetirizine (XYZAL) 5 MG tablet TAKE 1 TABLET BY MOUTH IN  THE EVENING   lisinopril (ZESTRIL) 40 MG tablet TAKE 1 TABLET BY MOUTH DAILY  sertraline (ZOLOFT) 50 MG tablet TAKE 1 TABLET BY MOUTH  DAILY   Evolocumab (REPATHA SURECLICK) 250 MG/ML SOAJ Inject 140 mg into the skin every 14 (fourteen) days. (Patient not taking: Reported on 11/17/2022)   [DISCONTINUED] ondansetron (ZOFRAN ODT) 4 MG disintegrating tablet Take 1 tablet (4 mg total) by mouth 2 (two) times daily as needed for nausea or vomiting. (Patient not taking: Reported on 11/17/2022)   No facility-administered encounter medications on file as of 11/17/2022.     Lab Results  Component Value Date   WBC 10.0 09/18/2021   HGB 14.7 09/18/2021   HCT 45.1 09/18/2021   PLT 253 09/18/2021   GLUCOSE 80 10/20/2022   CHOL 251 (H) 10/06/2022   TRIG 374 (H) 10/06/2022   HDL 42 10/06/2022   LDLCALC 141 (H) 10/06/2022   ALT 19 10/06/2022   AST 17 10/06/2022   NA 138 10/20/2022   K 4.0 10/20/2022   CL 98 10/20/2022   CREATININE 0.93 10/20/2022   BUN 11 10/20/2022   CO2 23 10/20/2022   TSH 1.750 09/18/2021   HGBA1C 7.0 (H) 10/06/2022    MM DIAG BREAST TOMO BILATERAL  Result Date: 01/29/2022 CLINICAL DATA:  61 year old female presenting for annual exam as well as late follow-up of a left breast biopsy demonstrating hemangioma in June 2021. EXAM: DIGITAL DIAGNOSTIC BILATERAL MAMMOGRAM WITH TOMOSYNTHESIS AND CAD TECHNIQUE: Bilateral digital diagnostic mammography and breast tomosynthesis was performed. The images were evaluated with computer-aided detection. COMPARISON:  Previous exam(s). ACR Breast Density Category b: There are scattered areas of fibroglandular density. FINDINGS: Right breast: No suspicious mass, distortion, or microcalcifications are identified to suggest  presence of malignancy. Left breast: There is a ribbon shaped biopsy marking clip at the site of prior biopsy demonstrating hemangioma in the superior central left breast. There is a small mass with post biopsy change. No new suspicious findings at the biopsy site or elsewhere in the left breast to suggest the presence of malignancy. IMPRESSION: No mammographic evidence of malignancy bilaterally. RECOMMENDATION: Screening mammogram in one year.(Code:SM-B-01Y) I have discussed the findings and recommendations with the patient. If applicable, a reminder letter will be sent to the patient regarding the next appointment. BI-RADS CATEGORY  2: Benign. Electronically Signed   By: Audie Pinto M.D.   On: 01/29/2022 14:33       Assessment & Plan:  Anxiety Assessment & Plan: Continue zoloft.  Appears to be stable.  Follow.    Essential hypertension Assessment & Plan: On amlodipine and lisinopril.  Spot check pressures.  Follow pressures and metabolic panel.  Continue current medication regimen. Blood pressure as outlined. Discussed possibility of hctz contributing to elevated calcium.  Stay hydrated.  Recheck calcium    Hypercholesterolemia Assessment & Plan: On zetia. Did not tolerate statin medication.  Low cholesterol diet and exercise.  Follow lipid panel.  Per note, repatha approved.    Personal history of colonic polyps Assessment & Plan: Colonoscopy 01/2021 - normal (Dr Allen Norris).  Recommended f/u colonoscopy in 5 years.    Type 1 diabetes mellitus with hyperglycemia (HCC) Assessment & Plan: Low carb diet and exercise.  Sugars as outlined.  Has adjusted her insulin.  Taking lantus bid.  Sugars improved.  Follow met b and a1c.       Einar Pheasant, MD

## 2022-11-29 ENCOUNTER — Encounter: Payer: Self-pay | Admitting: Internal Medicine

## 2022-11-29 NOTE — Assessment & Plan Note (Signed)
Continue zoloft.  Appears to be stable.  Follow.   °

## 2022-11-29 NOTE — Assessment & Plan Note (Signed)
On zetia. Did not tolerate statin medication.  Low cholesterol diet and exercise.  Follow lipid panel.  Per note, repatha approved.

## 2022-11-29 NOTE — Assessment & Plan Note (Signed)
On amlodipine and lisinopril.  Spot check pressures.  Follow pressures and metabolic panel.  Continue current medication regimen. Blood pressure as outlined. Discussed possibility of hctz contributing to elevated calcium.  Stay hydrated.  Recheck calcium

## 2022-11-29 NOTE — Assessment & Plan Note (Signed)
Low carb diet and exercise.  Sugars as outlined.  Has adjusted her insulin.  Taking lantus bid.  Sugars improved.  Follow met b and a1c.  

## 2022-11-29 NOTE — Assessment & Plan Note (Signed)
Colonoscopy 01/2021 - normal (Dr Wohl).  Recommended f/u colonoscopy in 5 years.  

## 2023-01-08 ENCOUNTER — Other Ambulatory Visit: Payer: Self-pay | Admitting: Internal Medicine

## 2023-01-08 LAB — HEPATIC FUNCTION PANEL
ALT: 18 IU/L (ref 0–32)
AST: 15 IU/L (ref 0–40)
Albumin: 4.7 g/dL (ref 3.9–4.9)
Alkaline Phosphatase: 67 IU/L (ref 44–121)
Bilirubin Total: 0.4 mg/dL (ref 0.0–1.2)
Bilirubin, Direct: 0.12 mg/dL (ref 0.00–0.40)
Total Protein: 7.4 g/dL (ref 6.0–8.5)

## 2023-01-08 LAB — CALCIUM: Calcium: 10 mg/dL (ref 8.7–10.3)

## 2023-01-09 ENCOUNTER — Other Ambulatory Visit: Payer: Self-pay | Admitting: Internal Medicine

## 2023-01-09 NOTE — Progress Notes (Signed)
Opened in error

## 2023-01-12 ENCOUNTER — Encounter: Payer: Self-pay | Admitting: Internal Medicine

## 2023-01-12 ENCOUNTER — Telehealth (INDEPENDENT_AMBULATORY_CARE_PROVIDER_SITE_OTHER): Payer: Managed Care, Other (non HMO) | Admitting: Internal Medicine

## 2023-01-12 VITALS — Ht 64.0 in | Wt 248.0 lb

## 2023-01-12 DIAGNOSIS — U071 COVID-19: Secondary | ICD-10-CM | POA: Diagnosis not present

## 2023-01-12 DIAGNOSIS — E7800 Pure hypercholesterolemia, unspecified: Secondary | ICD-10-CM

## 2023-01-12 DIAGNOSIS — E1065 Type 1 diabetes mellitus with hyperglycemia: Secondary | ICD-10-CM

## 2023-01-12 DIAGNOSIS — F419 Anxiety disorder, unspecified: Secondary | ICD-10-CM | POA: Diagnosis not present

## 2023-01-12 DIAGNOSIS — I10 Essential (primary) hypertension: Secondary | ICD-10-CM

## 2023-01-12 DIAGNOSIS — Z86010 Personal history of colonic polyps: Secondary | ICD-10-CM

## 2023-01-12 NOTE — Progress Notes (Signed)
Patient ID: Jill Crosby, female   DOB: 1961-04-17, 61 y.o.   MRN: [id number]   Virtual Visit via video Note   All issues noted in this document were discussed and addressed.  No physical exam was performed (except for noted visual exam findings with Video Visits).   I connected with Zehava Hardnett today by a video enabled telemedicine application and verified that I am speaking with the correct person using two identifiers. Location patient: home Location provider: work Persons participating in the virtual visit: patient, provider  The limitations, risks, security and privacy concerns of performing an evaluation and management service by video and the availability of in person appointments have bene discussed.  It has also been discussed with the patient that there may be a patient responsible charge related to this service. The patient expressed understanding and agreed to proceed.   Reason for visit: follow up appt  HPI: Was scheduled for a f/u appt.  Tested positive for covid today.  Elected to change visit to virtual visit.  Stated she started feeling bad Friday 01/09/23.  Felt - drained.  No energy.  Subsequently developed headache.  Nasal stuffiness.  Head feeling stopped up.  Sore throat.  Chills.  No chest congestion, chest pain or sob.  No sore throat now.  Stuffiness is better.  No vomiting or diarrhea.  Some nausea with coffee.  Has been eating.  States otherwise has been doing ok.  Reports am blood sugars typically 90-100 in am.  Before dinner 120-130.  For the past 3-4 days (while sick) - blood sugars - increased 200 range.  This am 134.     ROS: See pertinent positives and negatives per HPI.  Past Medical History:  Diagnosis Date   Anxiety    Depression    Elevated BP    Hypercholesterolemia    Increased BMI    Menopause    Morbid obesity ([place])    Type I diabetes mellitus ([place])     Past Surgical History:  Procedure Laterality Date   BREAST BIOPSY Left 05/09/2020    ribbon clip, stereo bx, HEMANGIOMA, BENIGN. - MAMMARY EPITHELIUM IS NOT PRESENT.   CATARACT EXTRACTION W/PHACO Left 11/08/2020   Procedure: CATARACT EXTRACTION PHACO AND INTRAOCULAR LENS PLACEMENT (IOC) LEFT DIABETIC 4.14 00:52.1 ;  Surgeon: Birder Robson, MD;  Location: [place];  Service: Ophthalmology;  Laterality: Left;   CATARACT EXTRACTION W/PHACO Right 12/04/2020   Procedure: CATARACT EXTRACTION PHACO AND INTRAOCULAR LENS PLACEMENT (IOC) RIGHT DIABETIC 6.11 00:46.1;  Surgeon: Birder Robson, MD;  Location: [place];  Service: Ophthalmology;  Laterality: Right;  Diabetic - insulin   CESAREAN SECTION  1997   COLONOSCOPY WITH PROPOFOL N/A 01/18/2021   Procedure: COLONOSCOPY WITH PROPOFOL;  Surgeon: Lucilla Lame, MD;  Location: [place];  Service: Endoscopy;  Laterality: N/A;  priority 4    Family History  Problem Relation Age of Onset   Hypertension Mother    Hyperlipidemia Mother    Colon polyps Mother    Cancer Father        lung   Colon cancer Maternal Grandfather    Diabetes Maternal Grandfather    Breast cancer Maternal Grandmother    Colon cancer Paternal Grandfather    Breast cancer Maternal Aunt    Heart disease Neg Hx    Ovarian cancer Neg Hx     SOCIAL HX: reviewed.    Current Outpatient Medications:    amLODipine (NORVASC) 10 MG tablet, TAKE 1 TABLET BY MOUTH  DAILY, Disp: 90 tablet, Rfl: 3   BAYER MICROLET LANCETS lancets, USE AS DIRECTED TO CHECK  SUGARS 1 TO 2 TIMES A DAY, Disp: 300 each, Rfl: 2   cholecalciferol (VITAMIN D3) 25 MCG (1000 UT) tablet, Take 1,000 Units by mouth daily., Disp: , Rfl:    CONTOUR TEST test strip, USE TO CHECK BLOOD GLUCOSE 6 TO  7 TIMES DAILY, Disp: 700 strip, Rfl: 3   Cyanocobalamin (B-12 PO), Take by mouth., Disp: , Rfl:    Evolocumab (REPATHA SURECLICK) XX123456 MG/ML SOAJ, Inject 140 mg into the skin every 14 (fourteen) days., Disp: 2 mL, Rfl: 2   ezetimibe (ZETIA) 10 MG tablet, TAKE 1 TABLET BY  MOUTH  DAILY, Disp: 90 tablet, Rfl: 3   hydrochlorothiazide (HYDRODIURIL) 25 MG tablet, TAKE 1 TABLET BY MOUTH  DAILY, Disp: 90 tablet, Rfl: 3   insulin lispro (HUMALOG KWIKPEN) 100 UNIT/ML KwikPen, INJECT SUBCUTANEOUSLY 50  UNITS DAILY, Disp: 45 mL, Rfl: 3   Insulin Pen Needle 32G X 4 MM MISC, Use as directed with insulin 4 x daily., Disp: 200 each, Rfl: 3   LANTUS SOLOSTAR 100 UNIT/ML Solostar Pen, INJECT SUBCUTANEOUSLY 80 UNITS  AT NIGHT, Disp: 75 mL, Rfl: 3   levocetirizine (XYZAL) 5 MG tablet, TAKE 1 TABLET BY MOUTH IN  THE EVENING, Disp: 90 tablet, Rfl: 0   lisinopril (ZESTRIL) 40 MG tablet, TAKE 1 TABLET BY MOUTH DAILY, Disp: 90 tablet, Rfl: 3   sertraline (ZOLOFT) 50 MG tablet, TAKE 1 TABLET BY MOUTH  DAILY, Disp: 90 tablet, Rfl: 3  EXAM:  GENERAL: alert, oriented, appears well and in no acute distress  HEENT: atraumatic, conjunttiva clear, no obvious abnormalities on inspection of external nose and ears  NECK: normal movements of the head and neck  LUNGS: on inspection no signs of respiratory distress, breathing rate appears normal, no obvious gross SOB, gasping or wheezing  CV: no obvious cyanosis  PSYCH/NEURO: pleasant and cooperative, no obvious depression or anxiety, speech and thought processing grossly intact  ASSESSMENT AND PLAN:  Discussed the following assessment and plan:  Problem List Items Addressed This Visit     Anxiety    Continue zoloft.  Appears to be stable.  Follow.       COVID-19 virus infection - Primary    Symptoms started several days ago as outlined.  Tested positive today.  Feeling better.  No chest pain or sob.  Discussed treatment.  Discussed oral antivirals and EUA.  She elects to hold on oral antiviral medication, given feeling better.  Discussed treating symptoms if needed - robitussin DM or delsym, steroid nasal spray / saline nasal spray.  Discussed quarantine guidelines.  Follow.  Call with update.       Essential hypertension    On  amlodipine and lisinopril.  Spot check pressures.  Follow pressures and metabolic panel.  Continue current medication regimen.       Hypercholesterolemia    On zetia. Did not tolerate statin medication.  Low cholesterol diet and exercise.  Follow lipid panel.  Per note, repatha approved. Send in to [facility].        Personal history of colonic polyps    Colonoscopy 01/2021 - normal (Dr Allen Norris).  Recommended f/u colonoscopy in 5 years.       Type 1 diabetes mellitus with hyperglycemia (HCC)    Low carb diet and exercise.  Sugars as outlined. Taking lantus bid.  Sugars improved.  Follow met b and a1c.  Return in about 9 weeks (around 03/16/2023) for follow-up with fasting labs 2 days prior.   I discussed the assessment and treatment plan with the patient. The patient was provided an opportunity to ask questions and all were answered. The patient agreed with the plan and demonstrated an understanding of the instructions.   The patient was advised to call back or seek an in-person evaluation if the symptoms worsen or if the condition fails to improve as anticipated.   Einar Pheasant, MD

## 2023-01-12 NOTE — Progress Notes (Deleted)
Subjective:    Patient ID: Jill Crosby, female    DOB: October 13, 1961, 62 y.o.   MRN: [id number]  Patient here for No chief complaint on file.   HPI Here to follow up regarding diabetes, hypertension and hypercholesterolemia.     Past Medical History:  Diagnosis Date   Anxiety    Depression    Elevated BP    Hypercholesterolemia    Increased BMI    Menopause    Morbid obesity ([place])    Type I diabetes mellitus ([place])    Past Surgical History:  Procedure Laterality Date   BREAST BIOPSY Left 05/09/2020   ribbon clip, stereo bx, HEMANGIOMA, BENIGN. - MAMMARY EPITHELIUM IS NOT PRESENT.   CATARACT EXTRACTION W/PHACO Left 11/08/2020   Procedure: CATARACT EXTRACTION PHACO AND INTRAOCULAR LENS PLACEMENT (IOC) LEFT DIABETIC 4.14 00:52.1 ;  Surgeon: Birder Robson, MD;  Location: [place];  Service: Ophthalmology;  Laterality: Left;   CATARACT EXTRACTION W/PHACO Right 12/04/2020   Procedure: CATARACT EXTRACTION PHACO AND INTRAOCULAR LENS PLACEMENT (IOC) RIGHT DIABETIC 6.11 00:46.1;  Surgeon: Birder Robson, MD;  Location: [place];  Service: Ophthalmology;  Laterality: Right;  Diabetic - insulin   CESAREAN SECTION  1997   COLONOSCOPY WITH PROPOFOL N/A 01/18/2021   Procedure: COLONOSCOPY WITH PROPOFOL;  Surgeon: Lucilla Lame, MD;  Location: [place];  Service: Endoscopy;  Laterality: N/A;  priority 4   Family History  Problem Relation Age of Onset   Hypertension Mother    Hyperlipidemia Mother    Colon polyps Mother    Cancer Father        lung   Colon cancer Maternal Grandfather    Diabetes Maternal Grandfather    Breast cancer Maternal Grandmother    Colon cancer Paternal Grandfather    Breast cancer Maternal Aunt    Heart disease Neg Hx    Ovarian cancer Neg Hx    Social History   Socioeconomic History   Marital status: Single    Spouse name: Not on file   Number of children: Not on file   Years of education: Not on file    Highest education level: Not on file  Occupational History   Not on file  Tobacco Use   Smoking status: Former    Packs/day: 0.50    Years: 20.00    Total pack years: 10.00    Types: Cigarettes   Smokeless tobacco: Never  Substance and Sexual Activity   Alcohol use: No    Alcohol/week: 0.0 standard drinks of alcohol   Drug use: No   Sexual activity: Not Currently    Birth control/protection: Post-menopausal  Other Topics Concern   Not on file  Social History Narrative   Not on file   Social Determinants of Health   Financial Resource Strain: Not on file  Food Insecurity: Not on file  Transportation Needs: Not on file  Physical Activity: Not on file  Stress: Not on file  Social Connections: Not on file     Review of Systems     Objective:     There were no vitals taken for this visit. Wt Readings from Last 3 Encounters:  11/17/22 248 lb (112.5 kg)  10/09/22 248 lb (112.5 kg)  07/08/22 247 lb 9.6 oz (112.3 kg)    Physical Exam   Outpatient Encounter Medications as of 01/12/2023  Medication Sig   amLODipine (NORVASC) 10 MG tablet TAKE 1 TABLET BY MOUTH DAILY   BAYER MICROLET LANCETS lancets USE AS  DIRECTED TO CHECK  SUGARS 1 TO 2 TIMES A DAY   cholecalciferol (VITAMIN D3) 25 MCG (1000 UT) tablet Take 1,000 Units by mouth daily.   CONTOUR TEST test strip USE TO CHECK BLOOD GLUCOSE 6 TO  7 TIMES DAILY   Cyanocobalamin (B-12 PO) Take by mouth.   Evolocumab (REPATHA SURECLICK) [id number] MG/ML SOAJ Inject 140 mg into the skin every 14 (fourteen) days. (Patient not taking: Reported on 11/17/2022)   ezetimibe (ZETIA) 10 MG tablet TAKE 1 TABLET BY MOUTH  DAILY   hydrochlorothiazide (HYDRODIURIL) 25 MG tablet TAKE 1 TABLET BY MOUTH  DAILY   insulin lispro (HUMALOG KWIKPEN) 100 UNIT/ML KwikPen INJECT SUBCUTANEOUSLY 50  UNITS DAILY   Insulin Pen Needle 32G X 4 MM MISC Use as directed with insulin 4 x daily.   LANTUS SOLOSTAR 100 UNIT/ML Solostar Pen INJECT SUBCUTANEOUSLY 80  UNITS  AT NIGHT   levocetirizine (XYZAL) 5 MG tablet TAKE 1 TABLET BY MOUTH IN  THE EVENING   lisinopril (ZESTRIL) 40 MG tablet TAKE 1 TABLET BY MOUTH DAILY   sertraline (ZOLOFT) 50 MG tablet TAKE 1 TABLET BY MOUTH  DAILY   No facility-administered encounter medications on file as of 01/12/2023.     Lab Results  Component Value Date   WBC 10.0 09/18/2021   HGB 14.7 09/18/2021   HCT 45.1 09/18/2021   PLT 253 09/18/2021   GLUCOSE 80 10/20/2022   CHOL 251 (H) 10/06/2022   TRIG 374 (H) 10/06/2022   HDL 42 10/06/2022   LDLCALC 141 (H) 10/06/2022   ALT 18 01/07/2023   AST 15 01/07/2023   NA 138 10/20/2022   K 4.0 10/20/2022   CL 98 10/20/2022   CREATININE 0.93 10/20/2022   BUN 11 10/20/2022   CO2 23 10/20/2022   TSH 1.750 09/18/2021   HGBA1C 7.0 (H) 10/06/2022    MM DIAG BREAST TOMO BILATERAL  Result Date: 01/29/2022 CLINICAL DATA:  62 year old female presenting for annual exam as well as late follow-up of a left breast biopsy demonstrating hemangioma in June 2021. EXAM: DIGITAL DIAGNOSTIC BILATERAL MAMMOGRAM WITH TOMOSYNTHESIS AND CAD TECHNIQUE: Bilateral digital diagnostic mammography and breast tomosynthesis was performed. The images were evaluated with computer-aided detection. COMPARISON:  Previous exam(s). ACR Breast Density Category b: There are scattered areas of fibroglandular density. FINDINGS: Right breast: No suspicious mass, distortion, or microcalcifications are identified to suggest presence of malignancy. Left breast: There is a ribbon shaped biopsy marking clip at the site of prior biopsy demonstrating hemangioma in the superior central left breast. There is a small mass with post biopsy change. No new suspicious findings at the biopsy site or elsewhere in the left breast to suggest the presence of malignancy. IMPRESSION: No mammographic evidence of malignancy bilaterally. RECOMMENDATION: Screening mammogram in one year.(Code:SM-B-01Y) I have discussed the findings and  recommendations with the patient. If applicable, a reminder letter will be sent to the patient regarding the next appointment. BI-RADS CATEGORY  2: Benign. Electronically Signed   By: Audie Pinto M.D.   On: 01/29/2022 14:33       Assessment & Plan:  There are no diagnoses linked to this encounter.   Einar Pheasant, MD

## 2023-01-13 ENCOUNTER — Telehealth: Payer: Self-pay | Admitting: Internal Medicine

## 2023-01-13 NOTE — Telephone Encounter (Signed)
I called pt, no answer. Pt need to F/U after her visit yesterday with Dr. Nicki Reaper. No vm set up.

## 2023-01-18 ENCOUNTER — Encounter: Payer: Self-pay | Admitting: Internal Medicine

## 2023-01-18 DIAGNOSIS — U071 COVID-19: Secondary | ICD-10-CM | POA: Insufficient documentation

## 2023-01-18 NOTE — Assessment & Plan Note (Signed)
On zetia. Did not tolerate statin medication.  Low cholesterol diet and exercise.  Follow lipid panel.  Per note, repatha approved. Send in to [facility].

## 2023-01-18 NOTE — Assessment & Plan Note (Signed)
On amlodipine and lisinopril.  Spot check pressures.  Follow pressures and metabolic panel.  Continue current medication regimen.

## 2023-01-18 NOTE — Assessment & Plan Note (Signed)
Symptoms started several days ago as outlined.  Tested positive today.  Feeling better.  No chest pain or sob.  Discussed treatment.  Discussed oral antivirals and EUA.  She elects to hold on oral antiviral medication, given feeling better.  Discussed treating symptoms if needed - robitussin DM or delsym, steroid nasal spray / saline nasal spray.  Discussed quarantine guidelines.  Follow.  Call with update.

## 2023-01-18 NOTE — Assessment & Plan Note (Signed)
Colonoscopy 01/2021 - normal (Dr Allen Norris).  Recommended f/u colonoscopy in 5 years.

## 2023-01-18 NOTE — Assessment & Plan Note (Signed)
Low carb diet and exercise.  Sugars as outlined. Taking lantus bid.  Sugars improved.  Follow met b and a1c.

## 2023-01-18 NOTE — Assessment & Plan Note (Signed)
Continue zoloft.  Appears to be stable.  Follow.

## 2023-02-07 ENCOUNTER — Other Ambulatory Visit: Payer: Self-pay | Admitting: Internal Medicine

## 2023-02-27 ENCOUNTER — Other Ambulatory Visit: Payer: Self-pay | Admitting: Internal Medicine

## 2023-03-09 ENCOUNTER — Other Ambulatory Visit: Payer: Self-pay | Admitting: Internal Medicine

## 2023-03-09 DIAGNOSIS — Z1231 Encounter for screening mammogram for malignant neoplasm of breast: Secondary | ICD-10-CM

## 2023-03-11 ENCOUNTER — Telehealth: Payer: Self-pay | Admitting: Internal Medicine

## 2023-03-11 DIAGNOSIS — E1065 Type 1 diabetes mellitus with hyperglycemia: Secondary | ICD-10-CM

## 2023-03-11 DIAGNOSIS — I10 Essential (primary) hypertension: Secondary | ICD-10-CM

## 2023-03-11 DIAGNOSIS — E7800 Pure hypercholesterolemia, unspecified: Secondary | ICD-10-CM

## 2023-03-11 NOTE — Telephone Encounter (Signed)
Labs placed. Patient aware. 

## 2023-03-11 NOTE — Telephone Encounter (Signed)
Pt called stating she want her labs sent to [facility] for her appointment on 4/16

## 2023-03-17 ENCOUNTER — Ambulatory Visit: Payer: Managed Care, Other (non HMO) | Admitting: Internal Medicine

## 2023-03-17 VITALS — BP 138/72 | HR 90 | Temp 98.3°F | Resp 16 | Ht 64.25 in | Wt 249.0 lb

## 2023-03-17 DIAGNOSIS — F419 Anxiety disorder, unspecified: Secondary | ICD-10-CM | POA: Diagnosis not present

## 2023-03-17 DIAGNOSIS — Z86010 Personal history of colonic polyps: Secondary | ICD-10-CM

## 2023-03-17 DIAGNOSIS — I10 Essential (primary) hypertension: Secondary | ICD-10-CM

## 2023-03-17 DIAGNOSIS — M545 Low back pain, unspecified: Secondary | ICD-10-CM

## 2023-03-17 DIAGNOSIS — E7800 Pure hypercholesterolemia, unspecified: Secondary | ICD-10-CM | POA: Diagnosis not present

## 2023-03-17 DIAGNOSIS — E1065 Type 1 diabetes mellitus with hyperglycemia: Secondary | ICD-10-CM

## 2023-03-17 DIAGNOSIS — Z720 Tobacco use: Secondary | ICD-10-CM

## 2023-03-17 LAB — HM DIABETES FOOT EXAM

## 2023-03-17 MED ORDER — LEVOCETIRIZINE DIHYDROCHLORIDE 5 MG PO TABS: 5.0000 mg | ORAL_TABLET | Freq: Every evening | ORAL | 3 refills | Status: DC

## 2023-03-17 NOTE — Progress Notes (Signed)
Subjective:    Patient ID: Jill Crosby, female    DOB: [birth year], 62 y.o.   MRN: [id number]  Patient here for  Chief Complaint  Patient presents with   Medical Management of Chronic Issues    HPI Here to follow up regarding hypercholesterolemia, hypertension and diabetes.  Having problems with sciatica.  Sitting ok. Low back hurts into her buttock.  Relieved with lying down.  Taking tylenol arthritis.  Right leg pain and right shoulder pain at times.  No chest pain reported.  Breathing stable.  No increased cough or congestion.  Reports sugars overall doing ok.     Past Medical History:  Diagnosis Date   Anxiety    Depression    Elevated BP    Hypercholesterolemia    Increased BMI    Menopause    Morbid obesity    Type I diabetes mellitus    Past Surgical History:  Procedure Laterality Date   BREAST BIOPSY Left 05/09/2020   ribbon clip, stereo bx, HEMANGIOMA, BENIGN. - MAMMARY EPITHELIUM IS NOT PRESENT.   CATARACT EXTRACTION W/PHACO Left 11/08/2020   Procedure: CATARACT EXTRACTION PHACO AND INTRAOCULAR LENS PLACEMENT (IOC) LEFT DIABETIC 4.14 00:52.1 ;  Surgeon: Galen Manila, MD;  Location: [facility];  Service: Ophthalmology;  Laterality: Left;   CATARACT EXTRACTION W/PHACO Right 12/04/2020   Procedure: CATARACT EXTRACTION PHACO AND INTRAOCULAR LENS PLACEMENT (IOC) RIGHT DIABETIC 6.11 00:46.1;  Surgeon: Galen Manila, MD;  Location: [facility];  Service: Ophthalmology;  Laterality: Right;  Diabetic - insulin   CESAREAN SECTION  1997   COLONOSCOPY WITH PROPOFOL N/A 01/18/2021   Procedure: COLONOSCOPY WITH PROPOFOL;  Surgeon: Midge Minium, MD;  Location: [facility];  Service: Endoscopy;  Laterality: N/A;  priority 4   Family History  Problem Relation Age of Onset   Hypertension Mother    Hyperlipidemia Mother    Colon polyps Mother    Cancer Father        lung   Colon cancer Maternal Grandfather    Diabetes Maternal Grandfather     Breast cancer Maternal Grandmother    Colon cancer Paternal Grandfather    Breast cancer Maternal Aunt    Heart disease Neg Hx    Ovarian cancer Neg Hx    Social History   Socioeconomic History   Marital status: Single    Spouse name: Not on file   Number of children: Not on file   Years of education: Not on file   Highest education level: Not on file  Occupational History   Not on file  Tobacco Use   Smoking status: Former    Packs/day: 0.50    Years: 20.00    Additional pack years: 0.00    Total pack years: 10.00    Types: Cigarettes   Smokeless tobacco: Never  Substance and Sexual Activity   Alcohol use: No    Alcohol/week: 0.0 standard drinks of alcohol   Drug use: No   Sexual activity: Not Currently    Birth control/protection: Post-menopausal  Other Topics Concern   Not on file  Social History Narrative   Not on file   Social Determinants of Health   Financial Resource Strain: Not on file  Food Insecurity: Not on file  Transportation Needs: Not on file  Physical Activity: Not on file  Stress: Not on file  Social Connections: Not on file     Review of Systems  Constitutional:  Negative for appetite change and unexpected weight change.  HENT:  Negative for congestion and sinus pressure.   Respiratory:  Negative for cough, chest tightness and shortness of breath.   Cardiovascular:  Negative for chest pain and palpitations.  Gastrointestinal:  Negative for abdominal pain, diarrhea, nausea and vomiting.  Genitourinary:  Negative for difficulty urinating and dysuria.  Musculoskeletal:  Positive for back pain. Negative for joint swelling and myalgias.  Skin:  Negative for color change and rash.  Neurological:  Negative for dizziness and headaches.  Psychiatric/Behavioral:  Negative for agitation and dysphoric mood.        Objective:     BP 138/72   Pulse 90   Temp 98.3 F (36.8 C)   Resp 16   Ht 5' 4.25" (1.632 m)   Wt 249 lb (112.9 kg)   SpO2  98%   BMI 42.41 kg/m  Wt Readings from Last 3 Encounters:  03/17/23 249 lb (112.9 kg)  01/12/23 248 lb (112.5 kg)  11/17/22 248 lb (112.5 kg)    Physical Exam Vitals reviewed.  Constitutional:      General: She is not in acute distress.    Appearance: Normal appearance.  HENT:     Head: Normocephalic and atraumatic.     Right Ear: External ear normal.     Left Ear: External ear normal.  Eyes:     General: No scleral icterus.       Right eye: No discharge.        Left eye: No discharge.     Conjunctiva/sclera: Conjunctivae normal.  Neck:     Thyroid: No thyromegaly.  Cardiovascular:     Rate and Rhythm: Normal rate and regular rhythm.  Pulmonary:     Effort: No respiratory distress.     Breath sounds: Normal breath sounds. No wheezing.  Abdominal:     General: Bowel sounds are normal.     Palpations: Abdomen is soft.     Tenderness: There is no abdominal tenderness.  Musculoskeletal:        General: No swelling or tenderness.     Cervical back: Neck supple. No tenderness.  Lymphadenopathy:     Cervical: No cervical adenopathy.  Skin:    Findings: No erythema or rash.  Neurological:     Mental Status: She is alert.  Psychiatric:        Mood and Affect: Mood normal.        Behavior: Behavior normal.      Outpatient Encounter Medications as of 03/17/2023  Medication Sig   amLODipine (NORVASC) 10 MG tablet TAKE 1 TABLET BY MOUTH DAILY   BAYER MICROLET LANCETS lancets USE AS DIRECTED TO CHECK  SUGARS 1 TO 2 TIMES A DAY   cholecalciferol (VITAMIN D3) 25 MCG (1000 UT) tablet Take 1,000 Units by mouth daily.   CONTOUR TEST test strip USE TO CHECK BLOOD GLUCOSE 6 TO  7 TIMES DAILY   Cyanocobalamin (B-12 PO) Take by mouth.   Evolocumab (REPATHA SURECLICK) 140 MG/ML SOAJ INJECT 140 MG INTO THE SKIN EVERY 14 (FOURTEEN) DAYS.   ezetimibe (ZETIA) 10 MG tablet TAKE 1 TABLET BY MOUTH DAILY   hydrochlorothiazide (HYDRODIURIL) 25 MG tablet TAKE 1 TABLET BY MOUTH DAILY    insulin lispro (HUMALOG KWIKPEN) 100 UNIT/ML KwikPen INJECT SUBCUTANEOUSLY 50  UNITS DAILY   Insulin Pen Needle 32G X 4 MM MISC Use as directed with insulin 4 x daily.   LANTUS SOLOSTAR 100 UNIT/ML Solostar Pen INJECT SUBCUTANEOUSLY 80 UNITS  AT NIGHT   levocetirizine (XYZAL) 5 MG tablet  Take 1 tablet (5 mg total) by mouth every evening.   lisinopril (ZESTRIL) 40 MG tablet TAKE 1 TABLET BY MOUTH DAILY   sertraline (ZOLOFT) 50 MG tablet TAKE 1 TABLET BY MOUTH DAILY   [DISCONTINUED] levocetirizine (XYZAL) 5 MG tablet TAKE 1 TABLET BY MOUTH IN  THE EVENING   No facility-administered encounter medications on file as of 03/17/2023.     Lab Results  Component Value Date   WBC 10.0 09/18/2021   HGB 14.7 09/18/2021   HCT 45.1 09/18/2021   PLT 253 09/18/2021   GLUCOSE 80 10/20/2022   CHOL 251 (H) 10/06/2022   TRIG 374 (H) 10/06/2022   HDL 42 10/06/2022   LDLCALC 141 (H) 10/06/2022   ALT 18 01/07/2023   AST 15 01/07/2023   NA 138 10/20/2022   K 4.0 10/20/2022   CL 98 10/20/2022   CREATININE 0.93 10/20/2022   BUN 11 10/20/2022   CO2 23 10/20/2022   TSH 1.750 09/18/2021   HGBA1C 7.0 (H) 10/06/2022    MM DIAG BREAST TOMO BILATERAL  Result Date: 01/29/2022 CLINICAL DATA:  62 year old female presenting for annual exam as well as late follow-up of a left breast biopsy demonstrating hemangioma in June 2021. EXAM: DIGITAL DIAGNOSTIC BILATERAL MAMMOGRAM WITH TOMOSYNTHESIS AND CAD TECHNIQUE: Bilateral digital diagnostic mammography and breast tomosynthesis was performed. The images were evaluated with computer-aided detection. COMPARISON:  Previous exam(s). ACR Breast Density Category b: There are scattered areas of fibroglandular density. FINDINGS: Right breast: No suspicious mass, distortion, or microcalcifications are identified to suggest presence of malignancy. Left breast: There is a ribbon shaped biopsy marking clip at the site of prior biopsy demonstrating hemangioma in the superior central  left breast. There is a small mass with post biopsy change. No new suspicious findings at the biopsy site or elsewhere in the left breast to suggest the presence of malignancy. IMPRESSION: No mammographic evidence of malignancy bilaterally. RECOMMENDATION: Screening mammogram in one year.(Code:SM-B-01Y) I have discussed the findings and recommendations with the patient. If applicable, a reminder letter will be sent to the patient regarding the next appointment. BI-RADS CATEGORY  2: Benign. Electronically Signed   By: Emmaline Kluver M.D.   On: 01/29/2022 14:33       Assessment & Plan:  Anxiety Assessment & Plan: Continue zoloft.  Appears to be stable.  Follow.    Essential hypertension Assessment & Plan: On amlodipine and lisinopril.  Spot check pressures.  Follow pressures and metabolic panel.  Continue current medication regimen.    Hypercholesterolemia Assessment & Plan: On zetia. Did not tolerate statin medication.  Low cholesterol diet and exercise.  Follow lipid panel.  Per note, repatha approved. Discussed starting repatha.    Personal history of colonic polyps Assessment & Plan: Colonoscopy 01/2021 - normal (Dr Servando Snare).  Recommended f/u colonoscopy in 5 years.    Tobacco use Assessment & Plan: Continues to smoke.  Discussed the need to stop smoking.     Type 1 diabetes mellitus with hyperglycemia Assessment & Plan: Low carb diet and exercise.  Sugars as outlined. Taking lantus bid.  Sugars improved.  Follow met b and a1c.    Midline low back pain, unspecified chronicity, unspecified whether sciatica present Assessment & Plan: Taking tylenol arthritis.  Stretches/exercise.  Follow.  Notify me if persistent.  Consider PT.    Other orders -     Levocetirizine Dihydrochloride; Take 1 tablet (5 mg total) by mouth every evening.  Dispense: 90 tablet; Refill: 3     Vinia Jemmott  Nicki Reaper, MD

## 2023-03-22 ENCOUNTER — Encounter: Payer: Self-pay | Admitting: Internal Medicine

## 2023-03-22 DIAGNOSIS — M549 Dorsalgia, unspecified: Secondary | ICD-10-CM | POA: Insufficient documentation

## 2023-03-22 NOTE — Assessment & Plan Note (Signed)
On zetia. Did not tolerate statin medication.  Low cholesterol diet and exercise.  Follow lipid panel.  Per note, repatha approved. Discussed starting repatha.

## 2023-03-22 NOTE — Assessment & Plan Note (Signed)
Colonoscopy 01/2021 - normal (Dr Wohl).  Recommended f/u colonoscopy in 5 years.  

## 2023-03-22 NOTE — Assessment & Plan Note (Signed)
On amlodipine and lisinopril.  Spot check pressures.  Follow pressures and metabolic panel.  Continue current medication regimen.  

## 2023-03-22 NOTE — Assessment & Plan Note (Signed)
Continue zoloft.  Appears to be stable.  Follow.   °

## 2023-03-22 NOTE — Assessment & Plan Note (Addendum)
Taking tylenol arthritis.  Stretches/exercise.  Follow.  Notify me if persistent.  Consider PT.

## 2023-03-22 NOTE — Assessment & Plan Note (Signed)
Continues to smoke.  Discussed the need to stop smoking.

## 2023-03-22 NOTE — Assessment & Plan Note (Signed)
Low carb diet and exercise.  Sugars as outlined. Taking lantus bid.  Sugars improved.  Follow met b and a1c.  

## 2023-03-25 LAB — CBC WITH DIFFERENTIAL/PLATELET
Basophils Absolute: 0.1 10*3/uL (ref 0.0–0.2)
Basos: 1 %
EOS (ABSOLUTE): 0.2 10*3/uL (ref 0.0–0.4)
Eos: 2 %
Hematocrit: 42.5 % (ref 34.0–46.6)
Hemoglobin: 14 g/dL (ref 11.1–15.9)
Immature Grans (Abs): 0.1 10*3/uL (ref 0.0–0.1)
Immature Granulocytes: 1 %
Lymphocytes Absolute: 2.7 10*3/uL (ref 0.7–3.1)
Lymphs: 22 %
MCH: 30.5 pg (ref 26.6–33.0)
MCHC: 32.9 g/dL (ref 31.5–35.7)
MCV: 93 fL (ref 79–97)
Monocytes Absolute: 0.9 10*3/uL (ref 0.1–0.9)
Monocytes: 7 %
Neutrophils Absolute: 8.3 10*3/uL — ABNORMAL HIGH (ref 1.4–7.0)
Neutrophils: 67 %
Platelets: 330 10*3/uL (ref 150–450)
RBC: 4.59 x10E6/uL (ref 3.77–5.28)
RDW: 12.7 % (ref 11.7–15.4)
WBC: 12.2 10*3/uL — ABNORMAL HIGH (ref 3.4–10.8)

## 2023-03-25 LAB — LIPID PANEL
Chol/HDL Ratio: 2 ratio (ref 0.0–4.4)
Cholesterol, Total: 104 mg/dL (ref 100–199)
HDL: 51 mg/dL (ref >39)
LDL Chol Calc (NIH): 16 mg/dL (ref 0–99)
Triglycerides: 254 mg/dL — ABNORMAL HIGH (ref 0–149)
VLDL Cholesterol Cal: 37 mg/dL (ref 5–40)

## 2023-03-25 LAB — HEPATIC FUNCTION PANEL
ALT: 19 IU/L (ref 0–32)
AST: 22 IU/L (ref 0–40)
Albumin: 4.2 g/dL (ref 3.9–4.9)
Alkaline Phosphatase: 59 IU/L (ref 44–121)
Bilirubin Total: 0.3 mg/dL (ref 0.0–1.2)
Bilirubin, Direct: 0.12 mg/dL (ref 0.00–0.40)
Total Protein: 7 g/dL (ref 6.0–8.5)

## 2023-03-25 LAB — BASIC METABOLIC PANEL
BUN/Creatinine Ratio: 17 (ref 12–28)
BUN: 20 mg/dL (ref 8–27)
CO2: 21 mmol/L (ref 20–29)
Calcium: 10 mg/dL (ref 8.7–10.3)
Chloride: 97 mmol/L (ref 96–106)
Creatinine, Ser: 1.16 mg/dL — ABNORMAL HIGH (ref 0.57–1.00)
Glucose: 91 mg/dL (ref 70–99)
Potassium: 4.3 mmol/L (ref 3.5–5.2)
Sodium: 137 mmol/L (ref 134–144)
eGFR: 53 mL/min/{1.73_m2} — ABNORMAL LOW (ref >59)

## 2023-03-25 LAB — MICROALBUMIN / CREATININE URINE RATIO
Creatinine, Urine: 206 mg/dL
Microalb/Creat Ratio: 11 mg/g creat (ref 0–29)
Microalbumin, Urine: 21.7 ug/mL

## 2023-03-25 LAB — HEMOGLOBIN A1C
Est. average glucose Bld gHb Est-mCnc: 157 mg/dL
Hgb A1c MFr Bld: 7.1 % — ABNORMAL HIGH (ref 4.8–5.6)

## 2023-03-25 LAB — TSH: TSH: 2.68 u[IU]/mL (ref 0.450–4.500)

## 2023-03-26 ENCOUNTER — Telehealth: Payer: Self-pay

## 2023-03-26 ENCOUNTER — Other Ambulatory Visit: Payer: Self-pay

## 2023-03-26 DIAGNOSIS — D729 Disorder of white blood cells, unspecified: Secondary | ICD-10-CM

## 2023-03-26 DIAGNOSIS — R300 Dysuria: Secondary | ICD-10-CM

## 2023-03-26 DIAGNOSIS — R944 Abnormal results of kidney function studies: Secondary | ICD-10-CM

## 2023-03-26 NOTE — Telephone Encounter (Signed)
Labs ordered for [facility].

## 2023-04-06 ENCOUNTER — Ambulatory Visit
Admission: RE | Admit: 2023-04-06 | Discharge: 2023-04-06 | Disposition: A | Discharge status: Home / Self Care | Payer: Managed Care, Other (non HMO) | Source: Ambulatory Visit | Attending: Internal Medicine | Admitting: Internal Medicine

## 2023-04-06 DIAGNOSIS — Z1231 Encounter for screening mammogram for malignant neoplasm of breast: Secondary | ICD-10-CM | POA: Insufficient documentation

## 2023-04-08 ENCOUNTER — Other Ambulatory Visit: Payer: Self-pay | Admitting: Internal Medicine

## 2023-04-08 DIAGNOSIS — R928 Other abnormal and inconclusive findings on diagnostic imaging of breast: Secondary | ICD-10-CM

## 2023-04-08 NOTE — Progress Notes (Signed)
Order placed for f/u left breast diagnostic mammogram and ultrasound.

## 2023-04-09 LAB — BASIC METABOLIC PANEL
BUN/Creatinine Ratio: 15 (ref 12–28)
BUN: 13 mg/dL (ref 8–27)
CO2: 22 mmol/L (ref 20–29)
Calcium: 9.4 mg/dL (ref 8.7–10.3)
Chloride: 96 mmol/L (ref 96–106)
Creatinine, Ser: 0.85 mg/dL (ref 0.57–1.00)
Glucose: 66 mg/dL — ABNORMAL LOW (ref 70–99)
Potassium: 4.4 mmol/L (ref 3.5–5.2)
Sodium: 133 mmol/L — ABNORMAL LOW (ref 134–144)
eGFR: 76 mL/min/{1.73_m2} (ref >59)

## 2023-04-09 LAB — URINALYSIS, ROUTINE W REFLEX MICROSCOPIC
Bilirubin, UA: NEGATIVE
Glucose, UA: NEGATIVE
Ketones, UA: NEGATIVE
Leukocytes,UA: NEGATIVE
Nitrite, UA: NEGATIVE
Protein,UA: NEGATIVE
RBC, UA: NEGATIVE
Specific Gravity, UA: 1.015 (ref 1.005–1.030)
Urobilinogen, Ur: 0.2 mg/dL (ref 0.2–1.0)
pH, UA: 6.5 (ref 5.0–7.5)

## 2023-04-09 LAB — CBC WITH DIFFERENTIAL/PLATELET
Basophils Absolute: 0.1 10*3/uL (ref 0.0–0.2)
Basos: 1 %
EOS (ABSOLUTE): 0.3 10*3/uL (ref 0.0–0.4)
Eos: 2 %
Hematocrit: 38.3 % (ref 34.0–46.6)
Hemoglobin: 13.1 g/dL (ref 11.1–15.9)
Immature Grans (Abs): 0 10*3/uL (ref 0.0–0.1)
Immature Granulocytes: 0 %
Lymphocytes Absolute: 2.5 10*3/uL (ref 0.7–3.1)
Lymphs: 19 %
MCH: 32 pg (ref 26.6–33.0)
MCHC: 34.2 g/dL (ref 31.5–35.7)
MCV: 94 fL (ref 79–97)
Monocytes Absolute: 1 10*3/uL — ABNORMAL HIGH (ref 0.1–0.9)
Monocytes: 8 %
Neutrophils Absolute: 9.5 10*3/uL — ABNORMAL HIGH (ref 1.4–7.0)
Neutrophils: 70 %
Platelets: 303 10*3/uL (ref 150–450)
RBC: 4.09 x10E6/uL (ref 3.77–5.28)
RDW: 12.4 % (ref 11.7–15.4)
WBC: 13.4 10*3/uL — ABNORMAL HIGH (ref 3.4–10.8)

## 2023-04-10 LAB — URINE CULTURE

## 2023-04-14 ENCOUNTER — Telehealth: Payer: Self-pay

## 2023-04-14 DIAGNOSIS — D729 Disorder of white blood cells, unspecified: Secondary | ICD-10-CM

## 2023-04-14 DIAGNOSIS — E871 Hypo-osmolality and hyponatremia: Secondary | ICD-10-CM

## 2023-04-14 NOTE — Telephone Encounter (Signed)
F/u labs ordered for [facility]

## 2023-04-15 ENCOUNTER — Ambulatory Visit
Admission: RE | Admit: 2023-04-15 | Discharge: 2023-04-15 | Disposition: A | Discharge status: Home / Self Care | Payer: Managed Care, Other (non HMO) | Source: Ambulatory Visit | Attending: Internal Medicine | Admitting: Internal Medicine

## 2023-04-15 DIAGNOSIS — R928 Other abnormal and inconclusive findings on diagnostic imaging of breast: Secondary | ICD-10-CM | POA: Insufficient documentation

## 2023-04-30 LAB — CBC WITH DIFFERENTIAL/PLATELET
Basophils Absolute: 0.1 10*3/uL (ref 0.0–0.2)
Basos: 1 %
EOS (ABSOLUTE): 0.3 10*3/uL (ref 0.0–0.4)
Eos: 3 %
Hematocrit: 39.7 % (ref 34.0–46.6)
Hemoglobin: 13 g/dL (ref 11.1–15.9)
Immature Grans (Abs): 0 10*3/uL (ref 0.0–0.1)
Immature Granulocytes: 0 %
Lymphocytes Absolute: 2.9 10*3/uL (ref 0.7–3.1)
Lymphs: 28 %
MCH: 30.3 pg (ref 26.6–33.0)
MCHC: 32.7 g/dL (ref 31.5–35.7)
MCV: 93 fL (ref 79–97)
Monocytes Absolute: 0.7 10*3/uL (ref 0.1–0.9)
Monocytes: 7 %
Neutrophils Absolute: 6.5 10*3/uL (ref 1.4–7.0)
Neutrophils: 61 %
Platelets: 289 10*3/uL (ref 150–450)
RBC: 4.29 x10E6/uL (ref 3.77–5.28)
RDW: 12.5 % (ref 11.7–15.4)
WBC: 10.5 10*3/uL (ref 3.4–10.8)

## 2023-04-30 LAB — SODIUM: Sodium: 136 mmol/L (ref 134–144)

## 2023-05-04 ENCOUNTER — Ambulatory Visit (INDEPENDENT_AMBULATORY_CARE_PROVIDER_SITE_OTHER): Payer: Managed Care, Other (non HMO) | Admitting: Internal Medicine

## 2023-05-04 ENCOUNTER — Encounter: Payer: Self-pay | Admitting: Internal Medicine

## 2023-05-04 VITALS — BP 138/72 | HR 91 | Temp 98.3°F | Ht 64.25 in | Wt 243.4 lb

## 2023-05-04 DIAGNOSIS — I10 Essential (primary) hypertension: Secondary | ICD-10-CM

## 2023-05-04 DIAGNOSIS — E1065 Type 1 diabetes mellitus with hyperglycemia: Secondary | ICD-10-CM

## 2023-05-04 DIAGNOSIS — E7800 Pure hypercholesterolemia, unspecified: Secondary | ICD-10-CM | POA: Diagnosis not present

## 2023-05-04 DIAGNOSIS — Z86010 Personal history of colonic polyps: Secondary | ICD-10-CM | POA: Diagnosis not present

## 2023-05-04 DIAGNOSIS — F419 Anxiety disorder, unspecified: Secondary | ICD-10-CM

## 2023-05-04 DIAGNOSIS — K047 Periapical abscess without sinus: Secondary | ICD-10-CM

## 2023-05-04 MED ORDER — INSULIN LISPRO (1 UNIT DIAL) 100 UNIT/ML (KWIKPEN): PEN_INJECTOR | SUBCUTANEOUS | 3 refills | Status: DC

## 2023-05-04 MED ORDER — REPATHA SURECLICK 140 MG/ML ~~LOC~~ SOAJ: 140.0000 mg | SUBCUTANEOUS | 2 refills | Status: DC

## 2023-05-04 NOTE — Progress Notes (Signed)
Subjective:    Patient ID: Jill Crosby, female    DOB: [birth year], 62 y.o.   MRN: [id number]  Patient here for  Chief Complaint  Patient presents with   Medical Management of Chronic Issues    6 week follow up     HPI Here to follow up regarding hypercholesterolemia, hypertension and diabetes. On amlodipine and lisinopril. Has just seen her dentist. Has abscessed tooth.  Starting amoxicillin.  Planning for root canal and crown.  Taking ibuprofen.  States otherwise she has been doing relatively well.  No chest pain or sob reported.  Blood sugars in am averaging 100-130.  PM 170.  Walking.  On repatha.  Discussed stopping zetia.  Watching diet.    Past Medical History:  Diagnosis Date   Anxiety    Depression    Elevated BP    Hypercholesterolemia    Increased BMI    Menopause    Morbid obesity (HCC)    Type I diabetes mellitus (HCC)    Past Surgical History:  Procedure Laterality Date   BREAST BIOPSY Left 05/09/2020   ribbon clip, stereo bx, HEMANGIOMA, BENIGN. - MAMMARY EPITHELIUM IS NOT PRESENT.   CATARACT EXTRACTION W/PHACO Left 11/08/2020   Procedure: CATARACT EXTRACTION PHACO AND INTRAOCULAR LENS PLACEMENT (IOC) LEFT DIABETIC 4.14 00:52.1 ;  Surgeon: Galen Manila, MD;  Location: [facility];  Service: Ophthalmology;  Laterality: Left;   CATARACT EXTRACTION W/PHACO Right 12/04/2020   Procedure: CATARACT EXTRACTION PHACO AND INTRAOCULAR LENS PLACEMENT (IOC) RIGHT DIABETIC 6.11 00:46.1;  Surgeon: Galen Manila, MD;  Location: [facility];  Service: Ophthalmology;  Laterality: Right;  Diabetic - insulin   CESAREAN SECTION  1997   COLONOSCOPY WITH PROPOFOL N/A 01/18/2021   Procedure: COLONOSCOPY WITH PROPOFOL;  Surgeon: Midge Minium, MD;  Location: [facility];  Service: Endoscopy;  Laterality: N/A;  priority 4   Family History  Problem Relation Age of Onset   Hypertension Mother    Hyperlipidemia Mother    Colon polyps Mother    Cancer  Father        lung   Colon cancer Maternal Grandfather    Diabetes Maternal Grandfather    Breast cancer Maternal Grandmother    Colon cancer Paternal Grandfather    Breast cancer Maternal Aunt    Heart disease Neg Hx    Ovarian cancer Neg Hx    Social History   Socioeconomic History   Marital status: Single    Spouse name: Not on file   Number of children: Not on file   Years of education: Not on file   Highest education level: Not on file  Occupational History   Not on file  Tobacco Use   Smoking status: Former    Packs/day: 0.50    Years: 20.00    Additional pack years: 0.00    Total pack years: 10.00    Types: Cigarettes   Smokeless tobacco: Never  Substance and Sexual Activity   Alcohol use: No    Alcohol/week: 0.0 standard drinks of alcohol   Drug use: No   Sexual activity: Not Currently    Birth control/protection: Post-menopausal  Other Topics Concern   Not on file  Social History Narrative   Not on file   Social Determinants of Health   Financial Resource Strain: Not on file  Food Insecurity: Not on file  Transportation Needs: Not on file  Physical Activity: Not on file  Stress: Not on file  Social Connections: Not on file  Review of Systems  Constitutional:  Negative for appetite change and unexpected weight change.  HENT:  Negative for congestion and sinus pressure.   Respiratory:  Negative for cough, chest tightness and shortness of breath.   Cardiovascular:  Negative for chest pain, palpitations and leg swelling.  Gastrointestinal:  Negative for abdominal pain, diarrhea, nausea and vomiting.  Genitourinary:  Negative for difficulty urinating and dysuria.  Musculoskeletal:  Negative for joint swelling and myalgias.  Skin:  Negative for color change and rash.  Neurological:  Negative for dizziness and headaches.  Psychiatric/Behavioral:  Negative for agitation and dysphoric mood.        Objective:     BP 138/72   Pulse 91   Temp 98.3  F (36.8 C) (Oral)   Ht 5' 4.25" (1.632 m)   Wt 243 lb 6.4 oz (110.4 kg)   SpO2 95%   BMI 41.46 kg/m  Wt Readings from Last 3 Encounters:  05/04/23 243 lb 6.4 oz (110.4 kg)  03/17/23 249 lb (112.9 kg)  01/12/23 248 lb (112.5 kg)    Physical Exam Vitals reviewed.  Constitutional:      General: She is not in acute distress.    Appearance: Normal appearance.  HENT:     Head: Normocephalic and atraumatic.     Right Ear: External ear normal.     Left Ear: External ear normal.  Eyes:     General: No scleral icterus.       Right eye: No discharge.        Left eye: No discharge.     Conjunctiva/sclera: Conjunctivae normal.  Neck:     Thyroid: No thyromegaly.  Cardiovascular:     Rate and Rhythm: Normal rate and regular rhythm.  Pulmonary:     Effort: No respiratory distress.     Breath sounds: Normal breath sounds. No wheezing.  Abdominal:     General: Bowel sounds are normal.     Palpations: Abdomen is soft.     Tenderness: There is no abdominal tenderness.  Musculoskeletal:        General: No swelling or tenderness.     Cervical back: Neck supple. No tenderness.  Lymphadenopathy:     Cervical: No cervical adenopathy.  Skin:    Findings: No erythema or rash.  Neurological:     Mental Status: She is alert.  Psychiatric:        Mood and Affect: Mood normal.        Behavior: Behavior normal.      Outpatient Encounter Medications as of 05/04/2023  Medication Sig   amLODipine (NORVASC) 10 MG tablet TAKE 1 TABLET BY MOUTH DAILY   BAYER MICROLET LANCETS lancets USE AS DIRECTED TO CHECK  SUGARS 1 TO 2 TIMES A DAY   cholecalciferol (VITAMIN D3) 25 MCG (1000 UT) tablet Take 1,000 Units by mouth daily.   CONTOUR TEST test strip USE TO CHECK BLOOD GLUCOSE 6 TO  7 TIMES DAILY   Cyanocobalamin (B-12 PO) Take by mouth.   ezetimibe (ZETIA) 10 MG tablet TAKE 1 TABLET BY MOUTH DAILY   hydrochlorothiazide (HYDRODIURIL) 25 MG tablet TAKE 1 TABLET BY MOUTH DAILY   Insulin Pen Needle  32G X 4 MM MISC Use as directed with insulin 4 x daily.   LANTUS SOLOSTAR 100 UNIT/ML Solostar Pen INJECT SUBCUTANEOUSLY 80 UNITS  AT NIGHT   levocetirizine (XYZAL) 5 MG tablet Take 1 tablet (5 mg total) by mouth every evening.   lisinopril (ZESTRIL) 40 MG tablet TAKE 1  TABLET BY MOUTH DAILY   sertraline (ZOLOFT) 50 MG tablet TAKE 1 TABLET BY MOUTH DAILY   [DISCONTINUED] Evolocumab (REPATHA SURECLICK) 140 MG/ML SOAJ INJECT 140 MG INTO THE SKIN EVERY 14 (FOURTEEN) DAYS.   [DISCONTINUED] insulin lispro (HUMALOG KWIKPEN) 100 UNIT/ML KwikPen INJECT SUBCUTANEOUSLY 50  UNITS DAILY (Patient taking differently: INJECT SUBCUTANEOUSLY 65  UNITS DAILY)   Evolocumab (REPATHA SURECLICK) 140 MG/ML SOAJ Inject 140 mg into the skin every 14 (fourteen) days.   insulin lispro (HUMALOG KWIKPEN) 100 UNIT/ML KwikPen INJECT SUBCUTANEOUSLY 65  UNITS DAILY.  (Takes distributed over three meals counting carbs)   No facility-administered encounter medications on file as of 05/04/2023.     Lab Results  Component Value Date   WBC 10.5 04/29/2023   HGB 13.0 04/29/2023   HCT 39.7 04/29/2023   PLT 289 04/29/2023   GLUCOSE 66 (L) 04/08/2023   CHOL 104 03/24/2023   TRIG 254 (H) 03/24/2023   HDL 51 03/24/2023   LDLCALC 16 03/24/2023   ALT 19 03/24/2023   AST 22 03/24/2023   NA 136 04/29/2023   K 4.4 04/08/2023   CL 96 04/08/2023   CREATININE 0.85 04/08/2023   BUN 13 04/08/2023   CO2 22 04/08/2023   TSH 2.680 03/24/2023   HGBA1C 7.1 (H) 03/24/2023    MM 3D DIAGNOSTIC MAMMOGRAM UNILATERAL LEFT BREAST  Result Date: 04/15/2023 CLINICAL DATA:  Screening recall for possible left breast asymmetry EXAM: DIGITAL DIAGNOSTIC UNILATERAL LEFT MAMMOGRAM WITH TOMOSYNTHESIS TECHNIQUE: Left digital diagnostic mammography and breast tomosynthesis was performed. COMPARISON:  Previous exam(s). ACR Breast Density Category b: There are scattered areas of fibroglandular density. FINDINGS: The previously described possible asymmetry in  the retroareolar left breast does not persist with additional views, consistent with superimposed fibroglandular tissue. No suspicious mass, microcalcification, or other finding is identified. IMPRESSION: The previously described possible asymmetry does not persist with additional views, consistent with superimposed fibroglandular tissue. No mammographic evidence of malignancy in the left breast. RECOMMENDATION: Return to routine screening mammography is recommended. The patient will be due for screening in May 2025. I have discussed the findings and recommendations with the patient. If applicable, a reminder letter will be sent to the patient regarding the next appointment. BI-RADS CATEGORY  1: Negative. Electronically Signed   By: Jacob Moores M.D.   On: 04/15/2023 15:49      Assessment & Plan:  Type 1 diabetes mellitus with hyperglycemia (HCC) Assessment & Plan: Low carb diet and exercise.  Sugars as outlined. Taking lantus bid.  Follow met b and a1c. Continue diet and exercise.    Personal history of colonic polyps Assessment & Plan: Colonoscopy 01/2021 - normal (Dr Servando Snare).  Recommended f/u colonoscopy in 5 years.    Hypercholesterolemia Assessment & Plan: On zetia and repatha.  Stop zetia. Low cholesterol diet and exercise.  Follow lipid panel.   Lab Results  Component Value Date   CHOL 104 03/24/2023   HDL 51 03/24/2023   LDLCALC 16 03/24/2023   TRIG 254 (H) 03/24/2023   CHOLHDL 2.0 03/24/2023      Essential hypertension Assessment & Plan: On amlodipine and lisinopril.  Spot check pressures.  Follow pressures and metabolic panel.  Continue current medication regimen.    Anxiety Assessment & Plan: Continue zoloft.  Appears to be stable.  Follow.    Abscessed tooth Assessment & Plan: Evaluated by dentist.  Amoxicilln.  Planned f/u with dentist.    Other orders -     Repatha SureClick; Inject 140 mg into  the skin every 14 (fourteen) days.  Dispense: 6 mL; Refill: 2 -      Insulin Lispro (1 Unit Dial); INJECT SUBCUTANEOUSLY 65  UNITS DAILY.  (Takes distributed over three meals counting carbs)  Dispense: 45 mL; Refill: 3     Dale Kenvir, MD

## 2023-05-07 ENCOUNTER — Telehealth: Payer: Self-pay | Admitting: Pharmacy Technician

## 2023-05-07 NOTE — Telephone Encounter (Signed)
Patient Advocate Encounter   Received notification that prior authorization for Repatha SureClick 140MG /ML auto-injectors is required.   PA submitted on 05/07/2023 Key [id number] Insurance [employer] Electronic Prior Authorization Form Status is pending

## 2023-05-10 ENCOUNTER — Encounter: Payer: Self-pay | Admitting: Internal Medicine

## 2023-05-10 DIAGNOSIS — K047 Periapical abscess without sinus: Secondary | ICD-10-CM | POA: Insufficient documentation

## 2023-05-10 NOTE — Assessment & Plan Note (Signed)
Continue zoloft.  Appears to be stable.  Follow.   °

## 2023-05-10 NOTE — Assessment & Plan Note (Signed)
Evaluated by dentist.  Amoxicilln.  Planned f/u with dentist.

## 2023-05-10 NOTE — Assessment & Plan Note (Signed)
On amlodipine and lisinopril.  Spot check pressures.  Follow pressures and metabolic panel.  Continue current medication regimen.  

## 2023-05-10 NOTE — Assessment & Plan Note (Signed)
On zetia and repatha.  Stop zetia. Low cholesterol diet and exercise.  Follow lipid panel.   Lab Results  Component Value Date   CHOL 104 03/24/2023   HDL 51 03/24/2023   LDLCALC 16 03/24/2023   TRIG 254 (H) 03/24/2023   CHOLHDL 2.0 03/24/2023

## 2023-05-10 NOTE — Assessment & Plan Note (Signed)
Low carb diet and exercise.  Sugars as outlined. Taking lantus bid.  Follow met b and a1c. Continue diet and exercise.

## 2023-05-10 NOTE — Assessment & Plan Note (Signed)
Colonoscopy 01/2021 - normal (Dr Wohl).  Recommended f/u colonoscopy in 5 years.  

## 2023-05-13 ENCOUNTER — Telehealth: Payer: Self-pay | Admitting: Internal Medicine

## 2023-05-13 NOTE — Telephone Encounter (Signed)
Pt called stating her PA for repatha was denied and she does not have anymore of it

## 2023-05-18 NOTE — Telephone Encounter (Signed)
Per note, status of PA is pending

## 2023-05-19 NOTE — Telephone Encounter (Signed)
PA was sent to appeal. Waiting for determination. Patient is aware.

## 2023-05-22 ENCOUNTER — Telehealth: Payer: Self-pay

## 2023-05-22 NOTE — Telephone Encounter (Signed)
Routed to [employer]

## 2023-05-22 NOTE — Telephone Encounter (Signed)
Repatha approved through 05/20/24. Approval letter attached to charts.

## 2023-06-12 ENCOUNTER — Other Ambulatory Visit: Payer: Self-pay | Admitting: Internal Medicine

## 2023-06-12 ENCOUNTER — Other Ambulatory Visit (HOSPITAL_COMMUNITY): Payer: Self-pay

## 2023-07-07 ENCOUNTER — Other Ambulatory Visit: Payer: Self-pay | Admitting: Internal Medicine

## 2023-07-15 ENCOUNTER — Telehealth: Payer: Self-pay | Admitting: Internal Medicine

## 2023-07-15 ENCOUNTER — Other Ambulatory Visit: Payer: Self-pay

## 2023-07-15 MED ORDER — INSULIN PEN NEEDLE 32G X 4 MM MISC: 3 refills | Status: DC

## 2023-07-15 MED ORDER — INSULIN LISPRO (1 UNIT DIAL) 100 UNIT/ML (KWIKPEN): PEN_INJECTOR | SUBCUTANEOUS | 3 refills | Status: DC

## 2023-07-15 NOTE — Telephone Encounter (Signed)
Patient just called and said she had a question about her insulin prescription name insulin lispro (HUMALOG KWIKPEN) 100 UNIT/ML KwikPen. She would like for someone to call her. Her number is [phone]. The pharmacy she uses is [facility] [place], [place] - [phone] [place]  [address] Phone: [phone]  Fax: [phone]

## 2023-07-15 NOTE — Telephone Encounter (Signed)
Confirmed with [employer] that they will ship out new rx prior to patient running out. She just received insulin 2 days ago but new shipment will go out first week of October.

## 2023-07-15 NOTE — Telephone Encounter (Signed)
RX corrected and sent to pharmacy

## 2023-07-16 LAB — HM DIABETES EYE EXAM

## 2023-08-02 ENCOUNTER — Other Ambulatory Visit: Payer: Self-pay | Admitting: Internal Medicine

## 2023-08-05 ENCOUNTER — Ambulatory Visit: Payer: Managed Care, Other (non HMO) | Admitting: Internal Medicine

## 2023-08-05 ENCOUNTER — Encounter: Payer: Self-pay | Admitting: Internal Medicine

## 2023-08-05 VITALS — BP 142/70 | HR 80 | Temp 97.8°F | Ht 64.0 in | Wt 247.4 lb

## 2023-08-05 DIAGNOSIS — I10 Essential (primary) hypertension: Secondary | ICD-10-CM | POA: Diagnosis not present

## 2023-08-05 DIAGNOSIS — Z720 Tobacco use: Secondary | ICD-10-CM

## 2023-08-05 DIAGNOSIS — Z23 Encounter for immunization: Secondary | ICD-10-CM

## 2023-08-05 DIAGNOSIS — E7800 Pure hypercholesterolemia, unspecified: Secondary | ICD-10-CM | POA: Diagnosis not present

## 2023-08-05 DIAGNOSIS — M25561 Pain in right knee: Secondary | ICD-10-CM

## 2023-08-05 DIAGNOSIS — M25562 Pain in left knee: Secondary | ICD-10-CM

## 2023-08-05 DIAGNOSIS — E1065 Type 1 diabetes mellitus with hyperglycemia: Secondary | ICD-10-CM

## 2023-08-05 DIAGNOSIS — Z86010 Personal history of colonic polyps: Secondary | ICD-10-CM

## 2023-08-05 DIAGNOSIS — Z794 Long term (current) use of insulin: Secondary | ICD-10-CM | POA: Diagnosis not present

## 2023-08-05 DIAGNOSIS — F419 Anxiety disorder, unspecified: Secondary | ICD-10-CM

## 2023-08-05 MED ORDER — LISINOPRIL 40 MG PO TABS: 40.0000 mg | ORAL_TABLET | Freq: Every day | ORAL | 3 refills | Status: DC

## 2023-08-05 NOTE — Assessment & Plan Note (Signed)
Colonoscopy 01/2021 - normal (Dr Allen Norris).  Recommended f/u colonoscopy in 5 years.

## 2023-08-05 NOTE — Assessment & Plan Note (Signed)
Persistent increased knee pain.  Limiting activity.  Refer to ortho for further evaluation and treatment. Limit antiinflammatory medication.

## 2023-08-05 NOTE — Assessment & Plan Note (Signed)
On repatha.  Off zetia. Low cholesterol diet and exercise.  Follow lipid panel.   Lab Results  Component Value Date   CHOL 104 03/24/2023   HDL 51 03/24/2023   LDLCALC 16 03/24/2023   TRIG 254 (H) 03/24/2023   CHOLHDL 2.0 03/24/2023

## 2023-08-05 NOTE — Assessment & Plan Note (Signed)
Low carb diet and exercise.  Sugars as outlined. Taking lantus bid.  Follow met b and a1c. Continue diet and exercise.

## 2023-08-05 NOTE — Assessment & Plan Note (Signed)
On amlodipine and lisinopril.  Spot check pressures.  Follow pressures and metabolic panel.  Continue current medication regimen. She will call with medications as outlined.

## 2023-08-05 NOTE — Progress Notes (Signed)
Subjective:    Patient ID: Jill Crosby, female    DOB: December 26, 1960, 62 y.o.   MRN: [id number]  Patient here for  Chief Complaint  Patient presents with   Medication Management    HPI Here to follow up regarding hypercholesterolemia, hypertension and diabetes. On amlodipine and lisinopril. Blood pressure elevated.  Discussed changing medication.  She wants to update me regarding her mother's blood pressure medication.  Feels may respond similarly. On repatha.  Off zetia. Reviewed labs.  Continues on zoloft. Overall doing well on zoloft.  No chest pain reported.  No increased cough or congestion.  Brought in no reported sugar readings.  States am sugars usually <130.  Before dinner - <200.  Before bed - 150s.  No abdominal pain.  Bowels stable. Does report - persistent bilateral knee pain. Notices more pain - after sitting and then stands to walk. Some increased pain - after standing.  Knee pain limits activity and exercise.  Has been taking antiinflammatory medication.    Past Medical History:  Diagnosis Date   Anxiety    Depression    Elevated BP    Hypercholesterolemia    Increased BMI    Menopause    Morbid obesity (HCC)    Type I diabetes mellitus (HCC)    Past Surgical History:  Procedure Laterality Date   BREAST BIOPSY Left 05/09/2020   ribbon clip, stereo bx, HEMANGIOMA, BENIGN. - MAMMARY EPITHELIUM IS NOT PRESENT.   CATARACT EXTRACTION W/PHACO Left 11/08/2020   Procedure: CATARACT EXTRACTION PHACO AND INTRAOCULAR LENS PLACEMENT (IOC) LEFT DIABETIC 4.14 00:52.1 ;  Surgeon: Galen Manila, MD;  Location: [facility];  Service: Ophthalmology;  Laterality: Left;   CATARACT EXTRACTION W/PHACO Right 12/04/2020   Procedure: CATARACT EXTRACTION PHACO AND INTRAOCULAR LENS PLACEMENT (IOC) RIGHT DIABETIC 6.11 00:46.1;  Surgeon: Galen Manila, MD;  Location: [facility];  Service: Ophthalmology;  Laterality: Right;  Diabetic - insulin   CESAREAN SECTION  1997    COLONOSCOPY WITH PROPOFOL N/A 01/18/2021   Procedure: COLONOSCOPY WITH PROPOFOL;  Surgeon: Midge Minium, MD;  Location: [facility];  Service: Endoscopy;  Laterality: N/A;  priority 4   Family History  Problem Relation Age of Onset   Hypertension Mother    Hyperlipidemia Mother    Colon polyps Mother    Cancer Father        lung   Colon cancer Maternal Grandfather    Diabetes Maternal Grandfather    Breast cancer Maternal Grandmother    Colon cancer Paternal Grandfather    Breast cancer Maternal Aunt    Heart disease Neg Hx    Ovarian cancer Neg Hx    Social History   Socioeconomic History   Marital status: Single    Spouse name: Not on file   Number of children: Not on file   Years of education: Not on file   Highest education level: Not on file  Occupational History   Not on file  Tobacco Use   Smoking status: Former    Current packs/day: 0.50    Average packs/day: 0.5 packs/day for 20.0 years (10.0 ttl pk-yrs)    Types: Cigarettes   Smokeless tobacco: Never  Substance and Sexual Activity   Alcohol use: No    Alcohol/week: 0.0 standard drinks of alcohol   Drug use: No   Sexual activity: Not Currently    Birth control/protection: Post-menopausal  Other Topics Concern   Not on file  Social History Narrative   Not on file  Social Determinants of Health   Financial Resource Strain: Not on file  Food Insecurity: Not on file  Transportation Needs: Not on file  Physical Activity: Not on file  Stress: Not on file  Social Connections: Not on file     Review of Systems  Constitutional:  Negative for appetite change and unexpected weight change.  HENT:  Negative for congestion and sinus pressure.   Respiratory:  Negative for cough, chest tightness and shortness of breath.   Cardiovascular:  Negative for chest pain, palpitations and leg swelling.  Gastrointestinal:  Negative for abdominal pain, diarrhea, nausea and vomiting.  Genitourinary:  Negative  for difficulty urinating and dysuria.  Musculoskeletal:        Knee pain as outlined.   Skin:  Negative for color change and rash.  Neurological:  Negative for dizziness and headaches.  Psychiatric/Behavioral:  Negative for agitation and dysphoric mood.        Objective:     BP (!) 142/70   Pulse 80   Temp 97.8 F (36.6 C) (Oral)   Ht 5\' 4"  (1.626 m)   Wt 247 lb 6.4 oz (112.2 kg)   SpO2 98%   BMI 42.47 kg/m  Wt Readings from Last 3 Encounters:  08/05/23 247 lb 6.4 oz (112.2 kg)  05/04/23 243 lb 6.4 oz (110.4 kg)  03/17/23 249 lb (112.9 kg)    Physical Exam Vitals reviewed.  Constitutional:      General: She is not in acute distress.    Appearance: Normal appearance.  HENT:     Head: Normocephalic and atraumatic.     Right Ear: External ear normal.     Left Ear: External ear normal.  Eyes:     General: No scleral icterus.       Right eye: No discharge.        Left eye: No discharge.     Conjunctiva/sclera: Conjunctivae normal.  Neck:     Thyroid: No thyromegaly.  Cardiovascular:     Rate and Rhythm: Normal rate and regular rhythm.  Pulmonary:     Effort: No respiratory distress.     Breath sounds: Normal breath sounds. No wheezing.  Abdominal:     General: Bowel sounds are normal.     Palpations: Abdomen is soft.     Tenderness: There is no abdominal tenderness.  Musculoskeletal:        General: No swelling or tenderness.     Cervical back: Neck supple. No tenderness.  Lymphadenopathy:     Cervical: No cervical adenopathy.  Skin:    Findings: No erythema or rash.  Neurological:     Mental Status: She is alert.  Psychiatric:        Mood and Affect: Mood normal.        Behavior: Behavior normal.      Outpatient Encounter Medications as of 08/05/2023  Medication Sig   amLODipine (NORVASC) 10 MG tablet TAKE 1 TABLET BY MOUTH DAILY   BAYER MICROLET LANCETS lancets USE AS DIRECTED TO CHECK  SUGARS 1 TO 2 TIMES A DAY   cholecalciferol (VITAMIN D3) 25 MCG  (1000 UT) tablet Take 1,000 Units by mouth daily.   CONTOUR NEXT TEST test strip USE TO CHECK BLOOD GLUCOSE 6 TO  7 TIMES DAILY   Cyanocobalamin (B-12 PO) Take by mouth.   Evolocumab (REPATHA SURECLICK) 140 MG/ML SOAJ Inject 140 mg into the skin every 14 (fourteen) days.   hydrochlorothiazide (HYDRODIURIL) 25 MG tablet TAKE 1 TABLET BY MOUTH DAILY  insulin lispro (HUMALOG KWIKPEN) 100 UNIT/ML KwikPen INJECT SUBCUTANEOUSLY 65  UNITS DAILY.  (Takes distributed over three meals counting carbs)   Insulin Pen Needle 32G X 4 MM MISC Use as directed with insulin 4 x daily.   LANTUS SOLOSTAR 100 UNIT/ML Solostar Pen INJECT SUBCUTANEOUSLY 80 UNITS  AT NIGHT   levocetirizine (XYZAL) 5 MG tablet Take 1 tablet (5 mg total) by mouth every evening.   lisinopril (ZESTRIL) 40 MG tablet Take 1 tablet (40 mg total) by mouth daily.   sertraline (ZOLOFT) 50 MG tablet TAKE 1 TABLET BY MOUTH DAILY   [DISCONTINUED] amLODipine (NORVASC) 10 MG tablet TAKE 1 TABLET BY MOUTH DAILY   [DISCONTINUED] ezetimibe (ZETIA) 10 MG tablet TAKE 1 TABLET BY MOUTH DAILY   [DISCONTINUED] lisinopril (ZESTRIL) 40 MG tablet TAKE 1 TABLET BY MOUTH DAILY   No facility-administered encounter medications on file as of 08/05/2023.     Lab Results  Component Value Date   WBC 10.5 04/29/2023   HGB 13.0 04/29/2023   HCT 39.7 04/29/2023   PLT 289 04/29/2023   GLUCOSE 66 (L) 04/08/2023   CHOL 104 03/24/2023   TRIG 254 (H) 03/24/2023   HDL 51 03/24/2023   LDLCALC 16 03/24/2023   ALT 19 03/24/2023   AST 22 03/24/2023   NA 136 04/29/2023   K 4.4 04/08/2023   CL 96 04/08/2023   CREATININE 0.85 04/08/2023   BUN 13 04/08/2023   CO2 22 04/08/2023   TSH 2.680 03/24/2023   HGBA1C 7.1 (H) 03/24/2023    MM 3D DIAGNOSTIC MAMMOGRAM UNILATERAL LEFT BREAST  Result Date: 04/15/2023 CLINICAL DATA:  Screening recall for possible left breast asymmetry EXAM: DIGITAL DIAGNOSTIC UNILATERAL LEFT MAMMOGRAM WITH TOMOSYNTHESIS TECHNIQUE: Left digital  diagnostic mammography and breast tomosynthesis was performed. COMPARISON:  Previous exam(s). ACR Breast Density Category b: There are scattered areas of fibroglandular density. FINDINGS: The previously described possible asymmetry in the retroareolar left breast does not persist with additional views, consistent with superimposed fibroglandular tissue. No suspicious mass, microcalcification, or other finding is identified. IMPRESSION: The previously described possible asymmetry does not persist with additional views, consistent with superimposed fibroglandular tissue. No mammographic evidence of malignancy in the left breast. RECOMMENDATION: Return to routine screening mammography is recommended. The patient will be due for screening in May 2025. I have discussed the findings and recommendations with the patient. If applicable, a reminder letter will be sent to the patient regarding the next appointment. BI-RADS CATEGORY  1: Negative. Electronically Signed   By: Jacob Moores M.D.   On: 04/15/2023 15:49      Assessment & Plan:  Hypercholesterolemia Assessment & Plan: On repatha.  Off zetia. Low cholesterol diet and exercise.  Follow lipid panel.   Lab Results  Component Value Date   CHOL 104 03/24/2023   HDL 51 03/24/2023   LDLCALC 16 03/24/2023   TRIG 254 (H) 03/24/2023   CHOLHDL 2.0 03/24/2023     Orders: -     Hepatic function panel  Essential hypertension Assessment & Plan: On amlodipine and lisinopril.  Spot check pressures.  Follow pressures and metabolic panel.  Continue current medication regimen. She will call with medications as outlined.   Orders: -     Basic metabolic panel  Type 1 diabetes mellitus with hyperglycemia (HCC) Assessment & Plan: Low carb diet and exercise.  Sugars as outlined. Taking lantus bid.  Follow met b and a1c. Continue diet and exercise.    Pain in both knees, unspecified chronicity Assessment &  Plan: Persistent increased knee pain.  Limiting  activity.  Refer to ortho for further evaluation and treatment. Limit antiinflammatory medication.   Orders: -     Ambulatory referral to Orthopedic Surgery  Encounter for immunization -     Flu vaccine trivalent PF, 6mos and older(Flulaval,Afluria,Fluarix,Fluzone)  Anxiety Assessment & Plan: Continue zoloft.  Appears to be stable.  Follow.    Personal history of colonic polyps Assessment & Plan: Colonoscopy 01/2021 - normal (Dr Servando Snare).  Recommended f/u colonoscopy in 5 years.    Tobacco use Assessment & Plan: Restarted smoking. Discussed the need to stop smoking.     Other orders -     Lisinopril; Take 1 tablet (40 mg total) by mouth daily.  Dispense: 90 tablet; Refill: 3     Dale Calumet, MD

## 2023-08-05 NOTE — Assessment & Plan Note (Signed)
Restarted smoking. Discussed the need to stop smoking.

## 2023-08-05 NOTE — Progress Notes (Signed)
Subjective:    Patient ID: Jill Crosby, female    DOB: 1961/08/29, 62 y.o.   MRN: [id number]  Patient here for No chief complaint on file.   HPI    Past Medical History:  Diagnosis Date   Anxiety    Depression    Elevated BP    Hypercholesterolemia    Increased BMI    Menopause    Morbid obesity (HCC)    Type I diabetes mellitus (HCC)    Past Surgical History:  Procedure Laterality Date   BREAST BIOPSY Left 05/09/2020   ribbon clip, stereo bx, HEMANGIOMA, BENIGN. - MAMMARY EPITHELIUM IS NOT PRESENT.   CATARACT EXTRACTION W/PHACO Left 11/08/2020   Procedure: CATARACT EXTRACTION PHACO AND INTRAOCULAR LENS PLACEMENT (IOC) LEFT DIABETIC 4.14 00:52.1 ;  Surgeon: Galen Manila, MD;  Location: [facility];  Service: Ophthalmology;  Laterality: Left;   CATARACT EXTRACTION W/PHACO Right 12/04/2020   Procedure: CATARACT EXTRACTION PHACO AND INTRAOCULAR LENS PLACEMENT (IOC) RIGHT DIABETIC 6.11 00:46.1;  Surgeon: Galen Manila, MD;  Location: [facility];  Service: Ophthalmology;  Laterality: Right;  Diabetic - insulin   CESAREAN SECTION  1997   COLONOSCOPY WITH PROPOFOL N/A 01/18/2021   Procedure: COLONOSCOPY WITH PROPOFOL;  Surgeon: Midge Minium, MD;  Location: [facility];  Service: Endoscopy;  Laterality: N/A;  priority 4   Family History  Problem Relation Age of Onset   Hypertension Mother    Hyperlipidemia Mother    Colon polyps Mother    Cancer Father        lung   Colon cancer Maternal Grandfather    Diabetes Maternal Grandfather    Breast cancer Maternal Grandmother    Colon cancer Paternal Grandfather    Breast cancer Maternal Aunt    Heart disease Neg Hx    Ovarian cancer Neg Hx    Social History   Socioeconomic History   Marital status: Single    Spouse name: Not on file   Number of children: Not on file   Years of education: Not on file   Highest education level: Not on file  Occupational History   Not on file  Tobacco  Use   Smoking status: Former    Current packs/day: 0.50    Average packs/day: 0.5 packs/day for 20.0 years (10.0 ttl pk-yrs)    Types: Cigarettes   Smokeless tobacco: Never  Substance and Sexual Activity   Alcohol use: No    Alcohol/week: 0.0 standard drinks of alcohol   Drug use: No   Sexual activity: Not Currently    Birth control/protection: Post-menopausal  Other Topics Concern   Not on file  Social History Narrative   Not on file   Social Determinants of Health   Financial Resource Strain: Not on file  Food Insecurity: Not on file  Transportation Needs: Not on file  Physical Activity: Not on file  Stress: Not on file  Social Connections: Not on file     Review of Systems     Objective:     There were no vitals taken for this visit. Wt Readings from Last 3 Encounters:  08/05/23 247 lb 6.4 oz (112.2 kg)  05/04/23 243 lb 6.4 oz (110.4 kg)  03/17/23 249 lb (112.9 kg)    Physical Exam   Outpatient Encounter Medications as of 08/05/2023  Medication Sig   amLODipine (NORVASC) 10 MG tablet TAKE 1 TABLET BY MOUTH DAILY   BAYER MICROLET LANCETS lancets USE AS DIRECTED TO CHECK  SUGARS 1  TO 2 TIMES A DAY   cholecalciferol (VITAMIN D3) 25 MCG (1000 UT) tablet Take 1,000 Units by mouth daily.   CONTOUR NEXT TEST test strip USE TO CHECK BLOOD GLUCOSE 6 TO  7 TIMES DAILY   Cyanocobalamin (B-12 PO) Take by mouth.   Evolocumab (REPATHA SURECLICK) 140 MG/ML SOAJ Inject 140 mg into the skin every 14 (fourteen) days.   hydrochlorothiazide (HYDRODIURIL) 25 MG tablet TAKE 1 TABLET BY MOUTH DAILY   insulin lispro (HUMALOG KWIKPEN) 100 UNIT/ML KwikPen INJECT SUBCUTANEOUSLY 65  UNITS DAILY.  (Takes distributed over three meals counting carbs)   Insulin Pen Needle 32G X 4 MM MISC Use as directed with insulin 4 x daily.   LANTUS SOLOSTAR 100 UNIT/ML Solostar Pen INJECT SUBCUTANEOUSLY 80 UNITS  AT NIGHT   levocetirizine (XYZAL) 5 MG tablet Take 1 tablet (5 mg total) by mouth every  evening.   lisinopril (ZESTRIL) 40 MG tablet Take 1 tablet (40 mg total) by mouth daily.   sertraline (ZOLOFT) 50 MG tablet TAKE 1 TABLET BY MOUTH DAILY   No facility-administered encounter medications on file as of 08/05/2023.     Lab Results  Component Value Date   WBC 10.5 04/29/2023   HGB 13.0 04/29/2023   HCT 39.7 04/29/2023   PLT 289 04/29/2023   GLUCOSE 66 (L) 04/08/2023   CHOL 104 03/24/2023   TRIG 254 (H) 03/24/2023   HDL 51 03/24/2023   LDLCALC 16 03/24/2023   ALT 19 03/24/2023   AST 22 03/24/2023   NA 136 04/29/2023   K 4.4 04/08/2023   CL 96 04/08/2023   CREATININE 0.85 04/08/2023   BUN 13 04/08/2023   CO2 22 04/08/2023   TSH 2.680 03/24/2023   HGBA1C 7.1 (H) 03/24/2023    MM 3D DIAGNOSTIC MAMMOGRAM UNILATERAL LEFT BREAST  Result Date: 04/15/2023 CLINICAL DATA:  Screening recall for possible left breast asymmetry EXAM: DIGITAL DIAGNOSTIC UNILATERAL LEFT MAMMOGRAM WITH TOMOSYNTHESIS TECHNIQUE: Left digital diagnostic mammography and breast tomosynthesis was performed. COMPARISON:  Previous exam(s). ACR Breast Density Category b: There are scattered areas of fibroglandular density. FINDINGS: The previously described possible asymmetry in the retroareolar left breast does not persist with additional views, consistent with superimposed fibroglandular tissue. No suspicious mass, microcalcification, or other finding is identified. IMPRESSION: The previously described possible asymmetry does not persist with additional views, consistent with superimposed fibroglandular tissue. No mammographic evidence of malignancy in the left breast. RECOMMENDATION: Return to routine screening mammography is recommended. The patient will be due for screening in May 2025. I have discussed the findings and recommendations with the patient. If applicable, a reminder letter will be sent to the patient regarding the next appointment. BI-RADS CATEGORY  1: Negative. Electronically Signed   By: Jacob Moores M.D.   On: 04/15/2023 15:49      Assessment & Plan:  There are no diagnoses linked to this encounter.   Dale [place], MD

## 2023-08-05 NOTE — Assessment & Plan Note (Signed)
Continue zoloft.  Appears to be stable.  Follow.   °

## 2023-11-10 ENCOUNTER — Encounter: Payer: Managed Care, Other (non HMO) | Admitting: Internal Medicine

## 2023-12-10 ENCOUNTER — Other Ambulatory Visit: Payer: Self-pay | Admitting: Internal Medicine

## 2023-12-29 ENCOUNTER — Other Ambulatory Visit: Payer: Self-pay | Admitting: Internal Medicine

## 2024-01-03 ENCOUNTER — Other Ambulatory Visit: Payer: Self-pay

## 2024-01-03 ENCOUNTER — Encounter: Payer: Self-pay | Admitting: Emergency Medicine

## 2024-01-03 ENCOUNTER — Inpatient Hospital Stay
Admission: EM | Admit: 2024-01-03 | Discharge: 2024-01-12 | DRG: 208 | Disposition: A | Discharge status: Home Health | Payer: Managed Care, Other (non HMO) | Attending: Internal Medicine | Admitting: Internal Medicine

## 2024-01-03 ENCOUNTER — Inpatient Hospital Stay: Payer: Managed Care, Other (non HMO)

## 2024-01-03 ENCOUNTER — Emergency Department: Payer: Managed Care, Other (non HMO)

## 2024-01-03 DIAGNOSIS — E878 Other disorders of electrolyte and fluid balance, not elsewhere classified: Secondary | ICD-10-CM | POA: Diagnosis present

## 2024-01-03 DIAGNOSIS — Z885 Allergy status to narcotic agent status: Secondary | ICD-10-CM | POA: Diagnosis not present

## 2024-01-03 DIAGNOSIS — E871 Hypo-osmolality and hyponatremia: Secondary | ICD-10-CM | POA: Diagnosis present

## 2024-01-03 DIAGNOSIS — Z79899 Other long term (current) drug therapy: Secondary | ICD-10-CM

## 2024-01-03 DIAGNOSIS — J9601 Acute respiratory failure with hypoxia: Principal | ICD-10-CM | POA: Diagnosis present

## 2024-01-03 DIAGNOSIS — T502X5A Adverse effect of carbonic-anhydrase inhibitors, benzothiadiazides and other diuretics, initial encounter: Secondary | ICD-10-CM | POA: Diagnosis present

## 2024-01-03 DIAGNOSIS — Z801 Family history of malignant neoplasm of trachea, bronchus and lung: Secondary | ICD-10-CM

## 2024-01-03 DIAGNOSIS — F17210 Nicotine dependence, cigarettes, uncomplicated: Secondary | ICD-10-CM | POA: Diagnosis present

## 2024-01-03 DIAGNOSIS — E861 Hypovolemia: Secondary | ICD-10-CM | POA: Diagnosis present

## 2024-01-03 DIAGNOSIS — Z8249 Family history of ischemic heart disease and other diseases of the circulatory system: Secondary | ICD-10-CM | POA: Diagnosis not present

## 2024-01-03 DIAGNOSIS — Z833 Family history of diabetes mellitus: Secondary | ICD-10-CM

## 2024-01-03 DIAGNOSIS — G928 Other toxic encephalopathy: Secondary | ICD-10-CM | POA: Diagnosis not present

## 2024-01-03 DIAGNOSIS — E109 Type 1 diabetes mellitus without complications: Secondary | ICD-10-CM | POA: Diagnosis not present

## 2024-01-03 DIAGNOSIS — Z794 Long term (current) use of insulin: Secondary | ICD-10-CM | POA: Diagnosis not present

## 2024-01-03 DIAGNOSIS — T4275XA Adverse effect of unspecified antiepileptic and sedative-hypnotic drugs, initial encounter: Secondary | ICD-10-CM | POA: Diagnosis present

## 2024-01-03 DIAGNOSIS — J09X2 Influenza due to identified novel influenza A virus with other respiratory manifestations: Secondary | ICD-10-CM | POA: Diagnosis not present

## 2024-01-03 DIAGNOSIS — E1065 Type 1 diabetes mellitus with hyperglycemia: Secondary | ICD-10-CM | POA: Diagnosis present

## 2024-01-03 DIAGNOSIS — I952 Hypotension due to drugs: Secondary | ICD-10-CM | POA: Diagnosis present

## 2024-01-03 DIAGNOSIS — G9341 Metabolic encephalopathy: Secondary | ICD-10-CM | POA: Diagnosis present

## 2024-01-03 DIAGNOSIS — E873 Alkalosis: Secondary | ICD-10-CM | POA: Diagnosis present

## 2024-01-03 DIAGNOSIS — E7800 Pure hypercholesterolemia, unspecified: Secondary | ICD-10-CM | POA: Diagnosis present

## 2024-01-03 DIAGNOSIS — E876 Hypokalemia: Secondary | ICD-10-CM | POA: Diagnosis present

## 2024-01-03 DIAGNOSIS — I5033 Acute on chronic diastolic (congestive) heart failure: Secondary | ICD-10-CM | POA: Diagnosis present

## 2024-01-03 DIAGNOSIS — Z83438 Family history of other disorder of lipoprotein metabolism and other lipidemia: Secondary | ICD-10-CM

## 2024-01-03 DIAGNOSIS — E66813 Obesity, class 3: Secondary | ICD-10-CM | POA: Diagnosis present

## 2024-01-03 DIAGNOSIS — Z803 Family history of malignant neoplasm of breast: Secondary | ICD-10-CM

## 2024-01-03 DIAGNOSIS — I5023 Acute on chronic systolic (congestive) heart failure: Secondary | ICD-10-CM | POA: Diagnosis not present

## 2024-01-03 DIAGNOSIS — Z791 Long term (current) use of non-steroidal anti-inflammatories (NSAID): Secondary | ICD-10-CM

## 2024-01-03 DIAGNOSIS — I110 Hypertensive heart disease with heart failure: Secondary | ICD-10-CM | POA: Diagnosis present

## 2024-01-03 DIAGNOSIS — R0609 Other forms of dyspnea: Secondary | ICD-10-CM | POA: Diagnosis not present

## 2024-01-03 DIAGNOSIS — Z79620 Long term (current) use of immunosuppressive biologic: Secondary | ICD-10-CM

## 2024-01-03 DIAGNOSIS — Z6841 Body Mass Index (BMI) 40.0 and over, adult: Secondary | ICD-10-CM | POA: Diagnosis not present

## 2024-01-03 DIAGNOSIS — J1001 Influenza due to other identified influenza virus with the same other identified influenza virus pneumonia: Principal | ICD-10-CM | POA: Diagnosis present

## 2024-01-03 DIAGNOSIS — Z1152 Encounter for screening for COVID-19: Secondary | ICD-10-CM | POA: Diagnosis not present

## 2024-01-03 DIAGNOSIS — R680 Hypothermia, not associated with low environmental temperature: Secondary | ICD-10-CM | POA: Diagnosis present

## 2024-01-03 DIAGNOSIS — J101 Influenza due to other identified influenza virus with other respiratory manifestations: Secondary | ICD-10-CM | POA: Diagnosis not present

## 2024-01-03 DIAGNOSIS — R739 Hyperglycemia, unspecified: Secondary | ICD-10-CM | POA: Diagnosis not present

## 2024-01-03 DIAGNOSIS — Z83719 Family history of colon polyps, unspecified: Secondary | ICD-10-CM

## 2024-01-03 DIAGNOSIS — Z800 Family history of malignant neoplasm of digestive organs: Secondary | ICD-10-CM

## 2024-01-03 LAB — CBC
Hemoglobin: 12.4 g/dL (ref 12.0–15.0)
Platelets: 291 10*3/uL (ref 150–400)
WBC: 22.7 10*3/uL — ABNORMAL HIGH (ref 4.0–10.5)
nRBC: 0 % (ref 0.0–0.2)

## 2024-01-03 LAB — CBC WITH DIFFERENTIAL/PLATELET
Abs Immature Granulocytes: 0.23 10*3/uL — ABNORMAL HIGH (ref 0.00–0.07)
Basophils Absolute: 0.1 10*3/uL (ref 0.0–0.1)
Basophils Relative: 0 %
Eosinophils Absolute: 0.3 10*3/uL (ref 0.0–0.5)
Eosinophils Relative: 1 %
Hemoglobin: 12 g/dL (ref 12.0–15.0)
Immature Granulocytes: 1 %
Lymphocytes Relative: 3 %
Lymphs Abs: 0.7 10*3/uL (ref 0.7–4.0)
Monocytes Absolute: 1.2 10*3/uL — ABNORMAL HIGH (ref 0.1–1.0)
Monocytes Relative: 5 %
Neutro Abs: 20.6 10*3/uL — ABNORMAL HIGH (ref 1.7–7.7)
Neutrophils Relative %: 90 %
Platelets: 323 10*3/uL (ref 150–400)
Smear Review: NORMAL
WBC: 23.1 10*3/uL — ABNORMAL HIGH (ref 4.0–10.5)
nRBC: 0 % (ref 0.0–0.2)

## 2024-01-03 LAB — HEPATIC FUNCTION PANEL
ALT: 56 U/L — ABNORMAL HIGH (ref 0–44)
AST: 90 U/L — ABNORMAL HIGH (ref 15–41)
Albumin: 3.4 g/dL — ABNORMAL LOW (ref 3.5–5.0)
Alkaline Phosphatase: 56 U/L (ref 38–126)
Bilirubin, Direct: 0.4 mg/dL — ABNORMAL HIGH (ref 0.0–0.2)
Indirect Bilirubin: 1 mg/dL — ABNORMAL HIGH (ref 0.3–0.9)
Total Bilirubin: 1.4 mg/dL — ABNORMAL HIGH (ref 0.0–1.2)
Total Protein: 6.8 g/dL (ref 6.5–8.1)

## 2024-01-03 LAB — URINALYSIS, COMPLETE (UACMP) WITH MICROSCOPIC
Bacteria, UA: NONE SEEN
Bilirubin Urine: NEGATIVE
Glucose, UA: >=500 mg/dL — AB
Hgb urine dipstick: NEGATIVE
Ketones, ur: NEGATIVE mg/dL
Leukocytes,Ua: NEGATIVE
Nitrite: NEGATIVE
Protein, ur: NEGATIVE mg/dL
Specific Gravity, Urine: 1.005 (ref 1.005–1.030)
Squamous Epithelial / HPF: 0 /[HPF] (ref 0–5)
pH: 6 (ref 5.0–8.0)

## 2024-01-03 LAB — LIPID PANEL
Cholesterol: 100 mg/dL (ref 0–200)
HDL: 47 mg/dL (ref >40)
LDL Cholesterol: 36 mg/dL (ref 0–99)
Total CHOL/HDL Ratio: 2.1 {ratio}
Triglycerides: 83 mg/dL (ref <150)
VLDL: 17 mg/dL (ref 0–40)

## 2024-01-03 LAB — SODIUM
Sodium: <102 mmol/L — CL (ref 135–145)
Sodium: 103 mmol/L — CL (ref 135–145)

## 2024-01-03 LAB — TRIGLYCERIDES: Triglycerides: 82 mg/dL (ref <150)

## 2024-01-03 LAB — BRAIN NATRIURETIC PEPTIDE
B Natriuretic Peptide: 288.5 pg/mL — ABNORMAL HIGH (ref 0.0–100.0)
B Natriuretic Peptide: 239.2 pg/mL — ABNORMAL HIGH (ref 0.0–100.0)

## 2024-01-03 LAB — BLOOD GAS, ARTERIAL
Acid-Base Excess: 2.5 mmol/L — ABNORMAL HIGH (ref 0.0–2.0)
Bicarbonate: 28.4 mmol/L — ABNORMAL HIGH (ref 20.0–28.0)
FIO2: 60 %
MECHVT: 500 mL
Mechanical Rate: 15
O2 Saturation: 88.8 %
PEEP: 5 cmH2O
Patient temperature: 37
pCO2 arterial: 48 mm[Hg] (ref 32–48)
pH, Arterial: 7.38 (ref 7.35–7.45)
pO2, Arterial: 56 mm[Hg] — ABNORMAL LOW (ref 83–108)

## 2024-01-03 LAB — CHOLESTEROL, TOTAL: Cholesterol: 101 mg/dL (ref 0–200)

## 2024-01-03 LAB — BLOOD GAS, VENOUS
Acid-Base Excess: 4 mmol/L — ABNORMAL HIGH (ref 0.0–2.0)
Bicarbonate: 29.2 mmol/L — ABNORMAL HIGH (ref 20.0–28.0)
O2 Saturation: 77.8 %
Patient temperature: 37
pCO2, Ven: 45 mm[Hg] (ref 44–60)
pH, Ven: 7.42 (ref 7.25–7.43)
pO2, Ven: 44 mm[Hg] (ref 32–45)

## 2024-01-03 LAB — BASIC METABOLIC PANEL
BUN: 10 mg/dL (ref 8–23)
CO2: 24 mmol/L (ref 22–32)
Calcium: 8 mg/dL — ABNORMAL LOW (ref 8.9–10.3)
Chloride: <65 mmol/L — CL (ref 98–111)
Creatinine, Ser: 0.63 mg/dL (ref 0.44–1.00)
GFR, Estimated: >60 mL/min (ref >60)
Glucose, Bld: 324 mg/dL — ABNORMAL HIGH (ref 70–99)
Potassium: 3.2 mmol/L — ABNORMAL LOW (ref 3.5–5.1)
Sodium: <102 mmol/L — CL (ref 135–145)

## 2024-01-03 LAB — SODIUM, URINE, RANDOM: Sodium, Ur: <10 mmol/L

## 2024-01-03 LAB — PROCALCITONIN: Procalcitonin: 0.23 ng/mL

## 2024-01-03 LAB — GLUCOSE, CAPILLARY: Glucose-Capillary: 336 mg/dL — ABNORMAL HIGH (ref 70–99)

## 2024-01-03 LAB — LACTIC ACID, PLASMA
Lactic Acid, Venous: 1 mmol/L (ref 0.5–1.9)
Lactic Acid, Venous: 1.3 mmol/L (ref 0.5–1.9)

## 2024-01-03 LAB — RESP PANEL BY RT-PCR (RSV, FLU A&B, COVID)  RVPGX2
Influenza A by PCR: POSITIVE — AB
Influenza B by PCR: NEGATIVE
Resp Syncytial Virus by PCR: NEGATIVE
SARS Coronavirus 2 by RT PCR: NEGATIVE

## 2024-01-03 LAB — TROPONIN I (HIGH SENSITIVITY)
Troponin I (High Sensitivity): 14 ng/L (ref <18)
Troponin I (High Sensitivity): 15 ng/L (ref <18)

## 2024-01-03 LAB — CREATININE, SERUM
Creatinine, Ser: 0.64 mg/dL (ref 0.44–1.00)
GFR, Estimated: >60 mL/min (ref >60)

## 2024-01-03 LAB — CBG MONITORING, ED: Glucose-Capillary: 311 mg/dL — ABNORMAL HIGH (ref 70–99)

## 2024-01-03 LAB — MRSA NEXT GEN BY PCR, NASAL: MRSA by PCR Next Gen: NOT DETECTED

## 2024-01-03 LAB — TSH: TSH: 0.617 u[IU]/mL (ref 0.350–4.500)

## 2024-01-03 LAB — OSMOLALITY: Osmolality: 221 mosm/kg — CL (ref 275–295)

## 2024-01-03 LAB — MAGNESIUM: Magnesium: 2.2 mg/dL (ref 1.7–2.4)

## 2024-01-03 LAB — OSMOLALITY, URINE: Osmolality, Ur: 146 mosm/kg — ABNORMAL LOW (ref 300–900)

## 2024-01-03 LAB — LIPASE, BLOOD: Lipase: 26 U/L (ref 11–51)

## 2024-01-03 MED ORDER — CHLORHEXIDINE GLUCONATE CLOTH 2 % EX PADS: 6.0000 | MEDICATED_PAD | Freq: Every day | CUTANEOUS | Status: DC

## 2024-01-03 MED ORDER — SODIUM CHLORIDE 3 % IV SOLN
600.0000 mL/h | Freq: Once | INTRAVENOUS | Status: AC
  Administered 2024-01-03: 600 mL/h via INTRAVENOUS
  Filled 2024-01-03: qty 500

## 2024-01-03 MED ORDER — SODIUM CHLORIDE 0.9% FLUSH
10.0000 mL | Freq: Two times a day (BID) | INTRAVENOUS | Status: DC
  Administered 2024-01-04 – 2024-01-06 (×7): 10 mL
  Administered 2024-01-07: 30 mL
  Administered 2024-01-07 – 2024-01-09 (×4): 10 mL
  Administered 2024-01-09: 30 mL
  Administered 2024-01-10 – 2024-01-12 (×4): 10 mL

## 2024-01-03 MED ORDER — MAGNESIUM SULFATE 2 GM/50ML IV SOLN
2.0000 g | Freq: Once | INTRAVENOUS | Status: DC
  Administered 2024-01-03: 2 g via INTRAVENOUS
  Filled 2024-01-03: qty 50

## 2024-01-03 MED ORDER — ETOMIDATE 2 MG/ML IV SOLN: INTRAVENOUS | Status: AC | PRN

## 2024-01-03 MED ORDER — SODIUM CHLORIDE 3 % IV SOLN
INTRAVENOUS | Status: DC
  Filled 2024-01-03 (×2): qty 500

## 2024-01-03 MED ORDER — ROCURONIUM BROMIDE 10 MG/ML (PF) SYRINGE
PREFILLED_SYRINGE | INTRAVENOUS | Status: AC | PRN
  Administered 2024-01-03: 100 mg via INTRAVENOUS

## 2024-01-03 MED ORDER — FENTANYL 2500MCG IN NS 250ML (10MCG/ML) PREMIX INFUSION
100.0000 ug/h | INTRAVENOUS | Status: DC
  Administered 2024-01-03: 100 ug/h via INTRAVENOUS
  Filled 2024-01-03: qty 250

## 2024-01-03 MED ORDER — PROPOFOL 1000 MG/100ML IV EMUL
5.0000 ug/kg/min | INTRAVENOUS | Status: DC
  Administered 2024-01-03 (×2): 20 ug/kg/min via INTRAVENOUS
  Administered 2024-01-04: 10 ug/kg/min via INTRAVENOUS
  Filled 2024-01-03 (×3): qty 100

## 2024-01-03 MED ORDER — OSELTAMIVIR PHOSPHATE 75 MG PO CAPS: 75.0000 mg | ORAL_CAPSULE | Freq: Every day | ORAL | Status: DC

## 2024-01-03 MED ORDER — ASPIRIN 300 MG RE SUPP
300.0000 mg | RECTAL | Status: AC
Start: 2024-01-03 — End: 2024-01-03
  Administered 2024-01-03: 300 mg via RECTAL
  Filled 2024-01-03: qty 1

## 2024-01-03 MED ORDER — OSELTAMIVIR PHOSPHATE 75 MG PO CAPS
75.0000 mg | ORAL_CAPSULE | Freq: Two times a day (BID) | ORAL | Status: DC
  Administered 2024-01-03 – 2024-01-05 (×4): 75 mg
  Filled 2024-01-03 (×8): qty 1

## 2024-01-03 MED ORDER — LORAZEPAM 2 MG/ML IJ SOLN
1.0000 mg | Freq: Once | INTRAMUSCULAR | Status: AC
  Administered 2024-01-03: 1 mg via INTRAVENOUS
  Filled 2024-01-03: qty 1

## 2024-01-03 MED ORDER — ORAL CARE MOUTH RINSE
15.0000 mL | OROMUCOSAL | Status: DC
  Administered 2024-01-03 – 2024-01-05 (×20): 15 mL via OROMUCOSAL

## 2024-01-03 MED ORDER — POLYETHYLENE GLYCOL 3350 17 G PO PACK: 17.0000 g | PACK | Freq: Every day | ORAL | Status: DC | PRN

## 2024-01-03 MED ORDER — FENTANYL BOLUS VIA INFUSION
100.0000 ug | Freq: Once | INTRAVENOUS | Status: AC
  Administered 2024-01-03: 100 ug via INTRAVENOUS
  Filled 2024-01-03: qty 100

## 2024-01-03 MED ORDER — ASPIRIN 81 MG PO CHEW
324.0000 mg | CHEWABLE_TABLET | ORAL | Status: AC
  Filled 2024-01-03: qty 4

## 2024-01-03 MED ORDER — SODIUM CHLORIDE 0.9% FLUSH
10.0000 mL | INTRAVENOUS | Status: DC | PRN
Start: 2024-01-03 — End: 2024-01-04
  Administered 2024-01-04: 10 mL

## 2024-01-03 MED ORDER — FAMOTIDINE 20 MG PO TABS
20.0000 mg | ORAL_TABLET | Freq: Two times a day (BID) | ORAL | Status: DC
  Administered 2024-01-03 – 2024-01-05 (×4): 20 mg
  Filled 2024-01-03 (×4): qty 1

## 2024-01-03 MED ORDER — MAGNESIUM SULFATE 2 GM/50ML IV SOLN
2.0000 g | Freq: Once | INTRAVENOUS | Status: AC
  Administered 2024-01-03: 2 g via INTRAVENOUS
  Filled 2024-01-03: qty 50

## 2024-01-03 MED ORDER — VANCOMYCIN HCL 2000 MG/400ML IV SOLN
2000.0000 mg | Freq: Once | INTRAVENOUS | Status: AC
Start: 2024-01-03 — End: 2024-01-03
  Administered 2024-01-03: 2000 mg via INTRAVENOUS
  Filled 2024-01-03: qty 400

## 2024-01-03 MED ORDER — SODIUM CHLORIDE 0.9 % IV SOLN
2.0000 g | Freq: Once | INTRAVENOUS | Status: AC
  Administered 2024-01-03: 2 g via INTRAVENOUS
  Filled 2024-01-03: qty 12.5

## 2024-01-03 MED ORDER — POTASSIUM CHLORIDE 10 MEQ/100ML IV SOLN
10.0000 meq | INTRAVENOUS | Status: AC
  Administered 2024-01-03 (×4): 10 meq via INTRAVENOUS
  Filled 2024-01-03 (×5): qty 100

## 2024-01-03 MED ORDER — ETOMIDATE 2 MG/ML IV SOLN
20.0000 mg | Freq: Once | INTRAVENOUS | Status: AC
  Administered 2024-01-03: 20 mg via INTRAVENOUS
  Filled 2024-01-03: qty 10

## 2024-01-03 MED ORDER — FUROSEMIDE 10 MG/ML IJ SOLN
80.0000 mg | Freq: Once | INTRAMUSCULAR | Status: AC
  Administered 2024-01-03: 80 mg via INTRAVENOUS
  Filled 2024-01-03: qty 8

## 2024-01-03 MED ORDER — ROCURONIUM BROMIDE 10 MG/ML (PF) SYRINGE
100.0000 mg | PREFILLED_SYRINGE | Freq: Once | INTRAVENOUS | Status: DC
  Filled 2024-01-03: qty 10

## 2024-01-03 MED ORDER — IPRATROPIUM-ALBUTEROL 0.5-2.5 (3) MG/3ML IN SOLN
9.0000 mL | Freq: Once | RESPIRATORY_TRACT | Status: AC
  Administered 2024-01-03: 9 mL via RESPIRATORY_TRACT
  Filled 2024-01-03: qty 9

## 2024-01-03 MED ORDER — SODIUM CHLORIDE 0.9 % IV BOLUS
1000.0000 mL | Freq: Once | INTRAVENOUS | Status: AC
Start: 2024-01-03 — End: 2024-01-03
  Administered 2024-01-03: 1000 mL via INTRAVENOUS

## 2024-01-03 MED ORDER — ORAL CARE MOUTH RINSE: 15.0000 mL | OROMUCOSAL | Status: DC | PRN

## 2024-01-03 MED ORDER — HEPARIN SODIUM (PORCINE) 5000 UNIT/ML IJ SOLN
5000.0000 [IU] | Freq: Three times a day (TID) | INTRAMUSCULAR | Status: DC
  Administered 2024-01-03 – 2024-01-05 (×5): 5000 [IU] via SUBCUTANEOUS
  Filled 2024-01-03 (×5): qty 1

## 2024-01-03 NOTE — ED Notes (Signed)
Pt restless throwing covers off. RN attempting to understand pt's needs but difficult doing so. Pt pulling at BiPAP mask. RN directing pt to leave mask on to aid in oxygenation. Oncoming nurse at bedside with this RN

## 2024-01-03 NOTE — Progress Notes (Signed)
PHARMACY CONSULT NOTE - FOLLOW UP  Pharmacy Consult for Electrolyte Monitoring and Replacement   Recent Labs: Potassium (mmol/L)  Date Value  01/03/2024 3.2 (L)   Magnesium (mg/dL)  Date Value  16/09/9603 2.0   Calcium (mg/dL)  Date Value  54/08/8118 8.0 (L)   Albumin (g/dL)  Date Value  14/78/2956 3.4 (L)  03/24/2023 4.2   Sodium (mmol/L)  Date Value  01/03/2024 <102 (LL)  04/29/2023 136     Assessment: Jill Crosby is a 63 y.o. female presenting with SOB. PMH significant for hypertension, hyperlipidemia, and diabetes. Pharmacy has been consulted to monitor and replace electrolytes as needed.  Goal of Therapy:  Electrolytes WNL  Plan:  Patient received Mag sulfate 2 g IV x 1 K low at 3.2, Give KCl 10 mEq IV x 4 No updated Mag or Phos levels Check BMP, Mag, and Phos with AM labs  Merryl Hacker ,PharmD Clinical Pharmacist 01/03/2024 6:37 PM

## 2024-01-03 NOTE — Progress Notes (Signed)
Dictation on: 01/03/2024  7:05 PM by: Lorain Childes [[id number]]

## 2024-01-03 NOTE — Progress Notes (Signed)
PHARMACY CONSULT NOTE - Hypertonic Saline  Pharmacy Consult for Hypertonic Saline Monitoring and Management  Recent Labs: Potassium (mmol/L)  Date Value  01/03/2024 3.2 (L)   Magnesium (mg/dL)  Date Value  46/96/2952 2.2   Calcium (mg/dL)  Date Value  84/13/2440 8.0 (L)   Albumin (g/dL)  Date Value  09/27/2535 3.4 (L)  03/24/2023 4.2   Sodium (mmol/L)  Date Value  01/03/2024 103 (LL)  04/29/2023 136    Assessment  Jill Crosby is a 63 y.o. female presenting with SOB. PMH significant for hypertension, hyperlipidemia, and diabetes. Pharmacy has been consulted to monitor hypertonic saline (3%) infusion.  Pertinent medications: None  Baseline Labs: Serum Na <102  Goal of Therapy:  Increase in Na by 4-6 mEq/L in 4-6 hours Do not exceed increase in Na by 10-12 mEq/L in 24 hours  Monitoring:  Date Time Na Rate/Comment  2/2       1633   102      Baseline  2/2       1735   102      Repeat previous level - 0.9 % NaCl 1000 mL X 1 given @ 1804 - 3% NaCl 100 mL IV X 1 given over 10 min @ 1853  2/2       2036   103      HTS not running, CCU NP notified - 3% NaCl started at 40 ml/hr @ 2/3 0002  2/3       0106   103      HTS only running for 1 hr  - 3% 100 mL X 1 bolus given @ 0221    Plan:  Give 3% saline bolus 500 mL x 1 as ordered by MD Check Na Q2H x2 then Q4H  Stop infusion if  Na increases > 4 mEq/L in first 2 hours Na increases > 6 mEq/L in first 4 hours Na increases > 6 mEq/L in 6 hours  Na increases > 8 mEq/L in 8 hours (give D5W bolus) Continue to monitor for signs of clinical improvement   Kearie Mennen D PharmD Clinical Pharmacist 01/03/24 10:25 PM

## 2024-01-03 NOTE — Progress Notes (Signed)
eLink Physician-Brief Progress Note Patient Name: Jill Crosby DOB: [birth year] MRN: [id number]   Date of Service  01/03/2024  HPI/Events of Note  63 year old female with a history of type 1 diabetes mellitus on insulin therapy and hypertension on hydrochlorothiazide who presents to the emergency department with encephalopathy, agitation, and respiratory failure likely secondary to influenza A now mechanically ventilated after failing BiPAP therapy.  She is mildly hypothermic, otherwise normal vitals.  Saturating 99% on 100% FiO2.  Currently on fentanyl infusion, propofol infusion and received broad-spectrum antibiotics.  Results consistent with mild hypoxemia, severe hypoosmotic hyponatremia leukocytosis.  Influenza A positive.  Moderate hyperglycemia.  No acute intracranial abnormalities.  eICU Interventions  Maintain mechanical ventilation, daily spontaneous awakening/breathing trials.  Hypertonic saline bolus.  Anticipating continuous 3% saline pending recheck.  Maintain Tamiflu  GI prophylaxis mild edema DVT prophylaxis with heparin     Intervention Category Evaluation Type: New Patient Evaluation  [name] 01/03/2024, 9:31 PM

## 2024-01-03 NOTE — ED Provider Notes (Signed)
[facility] Provider Note    Event Date/Time   First MD Initiated Contact with Patient 01/03/24 1451     (approximate)   History   Chief Complaint Shortness of Breath   HPI  Jill Crosby is a 63 y.o. female with past medical history of hypertension, hyperlipidemia, diabetes who presents to the ED complaining of shortness of breath.  Patient reports that she has had increasing difficulty breathing for the past 2 weeks with a productive cough, but denies any fevers or pain in her chest.  Per EMS, patient initially called out because of CBG of 59, however she ate something prior to their arrival and it was found to be 291 on recheck.  EMS also reported that she had oxygen saturations of 90% on room air, however oxygen saturations found to be 78% on room air in triage, patient immediately brought back to a room afterwards.  She was given 125 mg of IV Solu-Medrol prior to arrival, denies history of COPD but reports smoking 1 pack/day of cigarettes.      Physical Exam   Triage Vital Signs: ED Triage Vitals [01/03/24 1444]  Encounter Vitals Group     BP (!) 189/81     Systolic BP Percentile      Diastolic BP Percentile      Pulse Rate 69     Resp (!) 23     Temp      Temp src      SpO2 (!) 85 %     Weight 268 lb 15.4 oz (122 kg)     Height 5\' 4"  (1.626 m)     Head Circumference      Peak Flow      Pain Score 0     Pain Loc      Pain Education      Exclude from Growth Chart     Most recent vital signs: Vitals:   01/03/24 1452 01/03/24 1518  BP:    Pulse: 69   Resp: 15   Temp: 97.7 F (36.5 C)   SpO2: 94% 99%    Constitutional: Alert and oriented. Eyes: Conjunctivae are normal. Head: Atraumatic. Nose: No congestion/rhinnorhea. Mouth/Throat: Mucous membranes are moist.  Cardiovascular: Normal rate, regular rhythm. Grossly normal heart sounds.  2+ radial pulses bilaterally. Respiratory: Tachypneic with increased work of breathing and  poor air movement throughout, faint inspiratory wheezing noted. Gastrointestinal: Soft and nontender. No distention. Musculoskeletal: No lower extremity tenderness nor edema.  Neurologic:  Normal speech and language. No gross focal neurologic deficits are appreciated.    ED Results / Procedures / Treatments   Labs (all labs ordered are listed, but only abnormal results are displayed) Labs Reviewed  BLOOD GAS, VENOUS - Abnormal; Notable for the following components:      Result Value   Bicarbonate 29.2 (*)    Acid-Base Excess 4.0 (*)    All other components within normal limits  CBG MONITORING, ED - Abnormal; Notable for the following components:   Glucose-Capillary 311 (*)    All other components within normal limits  RESP PANEL BY RT-PCR (RSV, FLU A&B, COVID)  [id number]  CBC WITH DIFFERENTIAL/PLATELET  BASIC METABOLIC PANEL  HEPATIC FUNCTION PANEL  BRAIN NATRIURETIC PEPTIDE  TROPONIN I (HIGH SENSITIVITY)     EKG  ED ECG REPORT I, Chesley Noon, the attending physician, personally viewed and interpreted this ECG.   Date: 01/03/2024  EKG Time: 14:45  Rate: 70  Rhythm: normal sinus rhythm  Axis: RAD  Intervals:none  ST&T Change: None  RADIOLOGY Chest x-ray reviewed and interpreted by me with multifocal infiltrates concerning for edema.  PROCEDURES:  Critical Care performed: Yes, see critical care procedure note(s)  .Critical Care  Performed by: Chesley Noon, MD Authorized by: Chesley Noon, MD   Critical care provider statement:    Critical care time (minutes):  30   Critical care time was exclusive of:  Separately billable procedures and treating other patients and teaching time   Critical care was necessary to treat or prevent imminent or life-threatening deterioration of the following conditions:  Respiratory failure   Critical care was time spent personally by me on the following activities:  Development of treatment plan with patient or surrogate,  discussions with consultants, evaluation of patient's response to treatment, examination of patient, ordering and review of laboratory studies, ordering and review of radiographic studies, ordering and performing treatments and interventions, pulse oximetry, re-evaluation of patient's condition and review of old charts   I assumed direction of critical care for this patient from another provider in my specialty: no      MEDICATIONS ORDERED IN ED: Medications  furosemide (LASIX) injection 80 mg (has no administration in time range)  magnesium sulfate IVPB 2 g 50 mL (has no administration in time range)  ipratropium-albuterol (DUONEB) 0.5-2.5 (3) MG/3ML nebulizer solution 9 mL (9 mLs Nebulization Given 01/03/24 1457)     IMPRESSION / MDM / ASSESSMENT AND PLAN / ED COURSE  I reviewed the triage vital signs and the nursing notes.                              63 y.o. female with with past medical history of hypertension, hyperlipidemia, diabetes who presents to the ED with 2 weeks of increasing difficulty breathing with productive cough.  Patient's presentation is most consistent with acute presentation with potential threat to life or bodily function.  Differential diagnosis includes, but is not limited to, ACS, PE, pneumonia, pneumothorax, CHF, COPD, COVID-19, influenza, anemia, electrolyte abnormality, AKI.  Patient nontoxic-appearing and in no acute distress, vital signs remarkable for oxygen saturations in the 70s on room air, improved to the high 80s on 4 L nasal cannula.  She has significantly increased work of breathing and was placed on BiPAP to assist with this as well as hypoxic respiratory failure.  VBG is reassuring without significant hypercapnia, EKG shows no evidence of arrhythmia or ischemia.  Patient denies chest pain and I have low suspicion for ACS or PE.  Chest x-ray does have features concerning for CHF and we will diurese with IV Lasix, patient also given DuoNebs given  wheezing.  Patient turned over to on her provider pending reassessment and lab results.      FINAL CLINICAL IMPRESSION(S) / ED DIAGNOSES   Final diagnoses:  Acute respiratory failure with hypoxia (HCC)     Rx / DC Orders   ED Discharge Orders     None        Note:  This document was prepared using Dragon voice recognition software and may include unintentional dictation errors.   Chesley Noon, MD 01/03/24 [phone]

## 2024-01-03 NOTE — Progress Notes (Signed)
PHARMACY CONSULT NOTE - Hypertonic Saline  Pharmacy Consult for Hypertonic Saline Monitoring and Management  Recent Labs: Potassium (mmol/L)  Date Value  01/03/2024 3.2 (L)   Magnesium (mg/dL)  Date Value  16/09/9603 2.0   Calcium (mg/dL)  Date Value  54/08/8118 8.0 (L)   Albumin (g/dL)  Date Value  14/78/2956 3.4 (L)  03/24/2023 4.2   Sodium (mmol/L)  Date Value  01/03/2024 <102 (LL)  04/29/2023 136    Assessment  Jill Crosby is a 63 y.o. female presenting with SOB. PMH significant for hypertension, hyperlipidemia, and diabetes. Pharmacy has been consulted to monitor hypertonic saline (3%) infusion.  Pertinent medications: None  Baseline Labs: Serum Na <102  Goal of Therapy:  Increase in Na by 4-6 mEq/L in 4-6 hours Do not exceed increase in Na by 10-12 mEq/L in 24 hours  Monitoring:  Date Time Na Rate/Comment    Plan:  Give 3% saline bolus 500 mL x 1 as ordered by MD Check Na Q2H x2 then Q4H  Stop infusion if  Na increases > 4 mEq/L in first 2 hours Na increases > 6 mEq/L in first 4 hours Na increases > 6 mEq/L in 6 hours  Na increases > 8 mEq/L in 8 hours (give D5W bolus) Continue to monitor for signs of clinical improvement   Merryl Hacker PharmD Clinical Pharmacist 01/03/24 5:36 PM

## 2024-01-03 NOTE — H&P (Signed)
CRITICAL CARE H&P    Name: Jill Crosby MRN: [id number] DOB: October 15, 1961     LOS: 0   SUBJECTIVE FINDINGS & SIGNIFICANT EVENTS    History of Presenting Illness:  This is 63 year old female with a history of type 1 diabetes since age 37, previously on insulin pump which was switched to subcu injections years ago due to malfunction.  History was obtained from 2 sons at bedside due to patient being on mechanical ventilation during my evaluation.  Additional background medical history includes essential hypertension, dyslipidemia, anxiety disorder, history of smoking, arthritis, morbid obesity, glaucoma, who came in after having nausea vomiting and altered mentation status post influenza infection x 1 week.  In the ED she was swabbed and found to be positive for influenza A.  BMP showed hyperglycemia at 300 and severe hyponatremia and undetectably low levels less than 102 with hypochloremia.  She was encephalopathic and agitated and was intubated for this reason ER staff. PCCM for admission and management of patient due to severe hyponatremia with AMS on MV.   Lines/tubes :   Microbiology/Sepsis markers: Results for orders placed or performed during the hospital encounter of 01/03/24  Resp panel by RT-PCR (RSV, Flu A&B, Covid) Anterior Nasal Swab     Status: Abnormal   Collection Time: 01/03/24  2:52 PM   Specimen: Anterior Nasal Swab  Result Value Ref Range Status   SARS Coronavirus 2 by RT PCR NEGATIVE NEGATIVE Final    Comment: (NOTE) SARS-CoV-2 target nucleic acids are NOT DETECTED.  The SARS-CoV-2 RNA is generally detectable in upper respiratory specimens during the acute phase of infection. The lowest concentration of SARS-CoV-2 viral copies this assay can detect is 138 copies/mL. A negative result does not  preclude SARS-Cov-2 infection and should not be used as the sole basis for treatment or other patient management decisions. A negative result may occur with  improper specimen collection/handling, submission of specimen other than nasopharyngeal swab, presence of viral mutation(s) within the areas targeted by this assay, and inadequate number of viral copies(<138 copies/mL). A negative result must be combined with clinical observations, patient history, and epidemiological information. The expected result is Negative.  Fact Sheet for Patients:  BloggerCourse.com  Fact Sheet for Healthcare Providers:  SeriousBroker.it  This test is no t yet approved or cleared by the [place] FDA and  has been authorized for detection and/or diagnosis of SARS-CoV-2 by FDA under an Emergency Use Authorization (EUA). This EUA will remain  in effect (meaning this test can be used) for the duration of the COVID-19 declaration under Section 564(b)(1) of the Act, 21 U.S.C.section 360bbb-3(b)(1), unless the authorization is terminated  or revoked sooner.       Influenza A by PCR POSITIVE (A) NEGATIVE Final   Influenza B by PCR NEGATIVE NEGATIVE Final    Comment: (NOTE) The Xpert Xpress SARS-CoV-2/FLU/RSV plus assay is intended as an aid in the diagnosis of influenza from Nasopharyngeal swab specimens and should not be used as a sole basis for treatment. Nasal washings and aspirates are unacceptable for Xpert Xpress SARS-CoV-2/FLU/RSV testing.  Fact Sheet for Patients: BloggerCourse.com  Fact Sheet for Healthcare Providers: SeriousBroker.it  This test is not yet approved or cleared by the [place] FDA and has been authorized for detection and/or diagnosis of SARS-CoV-2 by FDA under an Emergency Use Authorization (EUA). This EUA will remain in effect (meaning this test can be used) for the  duration of the COVID-19 declaration under Section 564(b)(1) of the Act,  21 U.S.C. section 360bbb-3(b)(1), unless the authorization is terminated or revoked.     Resp Syncytial Virus by PCR NEGATIVE NEGATIVE Final    Comment: (NOTE) Fact Sheet for Patients: BloggerCourse.com  Fact Sheet for Healthcare Providers: SeriousBroker.it  This test is not yet approved or cleared by the [place] FDA and has been authorized for detection and/or diagnosis of SARS-CoV-2 by FDA under an Emergency Use Authorization (EUA). This EUA will remain in effect (meaning this test can be used) for the duration of the COVID-19 declaration under Section 564(b)(1) of the Act, 21 U.S.C. section 360bbb-3(b)(1), unless the authorization is terminated or revoked.  Performed at [facility], [address], [place], [place] [address]     Anti-infectives:  Anti-infectives (From admission, onward)    Start     Dose/Rate Route Frequency Ordered Stop   01/03/24 1745  ceFEPIme (MAXIPIME) 2 g in sodium chloride 0.9 % 100 mL IVPB        2 g 200 mL/hr over 30 Minutes Intravenous  Once 01/03/24 1737     01/03/24 1745  vancomycin (VANCOREADY) IVPB 2000 mg/400 mL        2,000 mg 200 mL/hr over 120 Minutes Intravenous  Once 01/03/24 1737          PAST MEDICAL HISTORY   Past Medical History:  Diagnosis Date   Anxiety    Depression    Elevated BP    Hypercholesterolemia    Increased BMI    Menopause    Morbid obesity (HCC)    Type I diabetes mellitus (HCC)      SURGICAL HISTORY   Past Surgical History:  Procedure Laterality Date   BREAST BIOPSY Left 05/09/2020   ribbon clip, stereo bx, HEMANGIOMA, BENIGN. - MAMMARY EPITHELIUM IS NOT PRESENT.   CATARACT EXTRACTION W/PHACO Left 11/08/2020   Procedure: CATARACT EXTRACTION PHACO AND INTRAOCULAR LENS PLACEMENT (IOC) LEFT DIABETIC 4.14 00:52.1 ;  Surgeon: Galen Manila, MD;  Location:  [facility];  Service: Ophthalmology;  Laterality: Left;   CATARACT EXTRACTION W/PHACO Right 12/04/2020   Procedure: CATARACT EXTRACTION PHACO AND INTRAOCULAR LENS PLACEMENT (IOC) RIGHT DIABETIC 6.11 00:46.1;  Surgeon: Galen Manila, MD;  Location: [facility];  Service: Ophthalmology;  Laterality: Right;  Diabetic - insulin   CESAREAN SECTION  1997   COLONOSCOPY WITH PROPOFOL N/A 01/18/2021   Procedure: COLONOSCOPY WITH PROPOFOL;  Surgeon: Midge Minium, MD;  Location: [facility];  Service: Endoscopy;  Laterality: N/A;  priority 4     FAMILY HISTORY   Family History  Problem Relation Age of Onset   Hypertension Mother    Hyperlipidemia Mother    Colon polyps Mother    Cancer Father        lung   Colon cancer Maternal Grandfather    Diabetes Maternal Grandfather    Breast cancer Maternal Grandmother    Colon cancer Paternal Grandfather    Breast cancer Maternal Aunt    Heart disease Neg Hx    Ovarian cancer Neg Hx      SOCIAL HISTORY   Social History   Tobacco Use   Smoking status: Former    Current packs/day: 0.50    Average packs/day: 0.5 packs/day for 20.0 years (10.0 ttl pk-yrs)    Types: Cigarettes   Smokeless tobacco: Never  Substance Use Topics   Alcohol use: No    Alcohol/week: 0.0 standard drinks of alcohol   Drug use: No     MEDICATIONS   Current Medication:  Current Facility-Administered Medications:    aspirin chewable tablet 324 mg, 324 mg, Oral, NOW **OR** aspirin suppository 300 mg, 300 mg, Rectal, NOW, Karna Christmas, Lazer Wollard, MD   ceFEPIme (MAXIPIME) 2 g in sodium chloride 0.9 % 100 mL IVPB, 2 g, Intravenous, Once, Delton Prairie, MD   etomidate (AMIDATE) injection, , Intravenous, Code/Trauma/Sedation Med, Delton Prairie, MD   famotidine (PEPCID) tablet 20 mg, 20 mg, Per Tube, BID, Karna Christmas, Quianna Avery, MD   fentaNYL (SUBLIMAZE) bolus via infusion 100 mcg, 100 mcg, Intravenous, Once, Delton Prairie, MD   fentaNYL in NS  (38mcg/ml) infusion-PREMIX, 100 mcg/hr, Intravenous, Continuous, Delton Prairie, MD, Last Rate: 10 mL/hr at 01/03/24 1821, 100 mcg/hr at 01/03/24 1821   heparin injection 5,000 Units, 5,000 Units, Subcutaneous, Q8H, Azana Kiesler, MD   polyethylene glycol (MIRALAX / GLYCOLAX) packet 17 g, 17 g, Oral, Daily PRN, Vida Rigger, MD   propofol (DIPRIVAN) 1000 MG/100ML infusion, 5-80 mcg/kg/min, Intravenous, Continuous, Delton Prairie, MD, Last Rate: 14.64 mL/hr at 01/03/24 1803, 20 mcg/kg/min at 01/03/24 1803   rocuronium [facility]) injection 100 mg, 100 mg, Intravenous, Once, Delton Prairie, MD   rocuronium Jettie Booze) injection, , Intravenous, Code/Trauma/Sedation Med, Delton Prairie, MD, 100 mg at 01/03/24 1758   sodium chloride (hypertonic) 3 % solution, 600 mL/hr, Intravenous, Once, Delton Prairie, MD   vancomycin (VANCOREADY) IVPB 2000 mg/400 mL, 2,000 mg, Intravenous, Once, Delton Prairie, MD  Current Outpatient Medications:    amLODipine (NORVASC) 10 MG tablet, TAKE 1 TABLET BY MOUTH DAILY, Disp: 90 tablet, Rfl: 3   BAYER MICROLET LANCETS lancets, USE AS DIRECTED TO CHECK  SUGARS 1 TO 2 TIMES A DAY, Disp: 300 each, Rfl: 2   cholecalciferol (VITAMIN D3) 25 MCG (1000 UT) tablet, Take 1,000 Units by mouth daily., Disp: , Rfl:    CONTOUR NEXT TEST test strip, USE TO CHECK BLOOD GLUCOSE 6 TO  7 TIMES DAILY, Disp: 700 strip, Rfl: 3   Cyanocobalamin (B-12 PO), Take by mouth., Disp: , Rfl:    hydrochlorothiazide (HYDRODIURIL) 25 MG tablet, TAKE 1 TABLET BY MOUTH DAILY, Disp: 90 tablet, Rfl: 3   insulin glargine (LANTUS SOLOSTAR) 100 UNIT/ML Solostar Pen, INJECT SUBCUTANEOUSLY 80 UNITS  AT NIGHT, Disp: 75 mL, Rfl: 0   insulin lispro (HUMALOG KWIKPEN) 100 UNIT/ML KwikPen, INJECT SUBCUTANEOUSLY 65  UNITS DAILY.  (Takes distributed over three meals counting carbs), Disp: 45 mL, Rfl: 3   Insulin Pen Needle 32G X 4 MM MISC, Use as directed with insulin 4 x daily., Disp: 200 each, Rfl: 3   levocetirizine (XYZAL) 5 MG  tablet, Take 1 tablet (5 mg total) by mouth every evening., Disp: 90 tablet, Rfl: 3   lisinopril (ZESTRIL) 40 MG tablet, Take 1 tablet (40 mg total) by mouth daily., Disp: 90 tablet, Rfl: 3   REPATHA SURECLICK 140 MG/ML SOAJ, INJECT 1 PEN SUBCUTANEOUSLY  EVERY 2 WEEKS, Disp: 6 mL, Rfl: 3   sertraline (ZOLOFT) 50 MG tablet, TAKE 1 TABLET BY MOUTH DAILY, Disp: 90 tablet, Rfl: 3    ALLERGIES   Codeine    REVIEW OF SYSTEMS     Unable to obtain ROS due to mechanical ventilation and sedation  PHYSICAL EXAMINATION   Vital Signs: Temp:  [97.7 F (36.5 C)] 97.7 F (36.5 C) (02/02 1452) Pulse Rate:  [69-70] 70 (02/02 1600) Resp:  [15-23] 18 (02/02 1600) BP: (142-189)/(81-92) 142/92 (02/02 1600) SpO2:  [85 %-100 %] 100 % (02/02 1801) Weight:  [122 kg] 122 kg (02/02 1444)  GENERAL: Age-appropriate obese female on  ventilator sedated HEAD: Normocephalic, atraumatic.  EYES: Pupils equal, round, reactive to light.  No scleral icterus.  MOUTH: Moist mucosal membrane. NECK: Supple. No thyromegaly. No nodules. No JVD.  PULMONARY: Rhonchi bilaterally with decreased breath sounds on mechanical ventilation CARDIOVASCULAR: S1 and S2. Regular rate and rhythm. No murmurs, rubs, or gallops.  GASTROINTESTINAL: Soft, nontender, non-distended. No masses. Positive bowel sounds. No hepatosplenomegaly.  MUSCULOSKELETAL: No swelling, clubbing, or edema.  NEUROLOGIC: Sedated with GCS 4 T SKIN:intact,warm,dry   PERTINENT DATA     Infusions:  ceFEPime (MAXIPIME) IV     fentaNYL infusion INTRAVENOUS 100 mcg/hr (01/03/24 1821)   propofol (DIPRIVAN) infusion 20 mcg/kg/min (01/03/24 1803)   sodium chloride (hypertonic)     vancomycin     Scheduled Medications:  aspirin  324 mg Oral NOW   Or   aspirin  300 mg Rectal NOW   famotidine  20 mg Per Tube BID   fentaNYL  100 mcg Intravenous Once   heparin  5,000 Units Subcutaneous Q8H   rocuronium  100 mg Intravenous Once   PRN  Medications: etomidate, polyethylene glycol, rocuronium Hemodynamic parameters:   Intake/Output: No intake/output data recorded.  Ventilator  Settings:     LAB RESULTS:  Basic Metabolic Panel: Recent Labs  Lab 01/03/24 1633  NA <102*  K 3.2*  CL <65*  CO2 24  GLUCOSE 324*  BUN 10  CREATININE 0.63  CALCIUM 8.0*   Liver Function Tests: Recent Labs  Lab 01/03/24 1633  AST 90*  ALT 56*  ALKPHOS 56  BILITOT 1.4*  PROT 6.8  ALBUMIN 3.4*   No results for input(s): "LIPASE", "AMYLASE" in the last 168 hours. No results for input(s): "AMMONIA" in the last 168 hours. CBC: Recent Labs  Lab 01/03/24 1633  WBC 23.1*  NEUTROABS 20.6*  HGB 12.0  HCT RESULTS UNAVAILABLE DUE TO INTERFERING SUBSTANCE  MCV RESULTS UNAVAILABLE DUE TO INTERFERING SUBSTANCE  PLT 323   Cardiac Enzymes: No results for input(s): "CKTOTAL", "CKMB", "CKMBINDEX", "TROPONINI" in the last 168 hours. BNP: Invalid input(s): "POCBNP" CBG: Recent Labs  Lab 01/03/24 1516  GLUCAP 311*       IMAGING RESULTS:     Narrative & Impression  CLINICAL DATA:  Shortness of breath   EXAM: PORTABLE CHEST 1 VIEW   COMPARISON:  None Available.   FINDINGS: The heart size and mediastinal contours are within normal limits. Extensive patchy airspace opacities throughout both lungs. No pleural effusion or pneumothorax. The visualized skeletal structures are unremarkable.   IMPRESSION: Extensive patchy airspace opacities throughout both lungs, which could represent multifocal pneumonia or edema.     Electronically Signed   By: Duanne Guess D.O.   On: 01/03/2024 15:21      ASSESSMENT AND PLAN    -Multidisciplinary rounds held today  Acute Hypoxic Respiratory Failure -with bilateral pneumonia due to viral infection Influenza A by PCR POSITIVE Abnormal   Tamiflu 75mg  x 5d -continue Full MV support- PRVC - wean FIO2 with goal spO2 >90% -monitor for ARDS -continue Bronchodilator  Therapy -Wean Fio2 and PEEP as tolerated -will perform SAT/SBT when respiratory parameters are met   Altered mental status with encephalopathy   - due to hypovolemic hypotonic hyponatremia    -s/p vomiting post initial sick per family   - treat underlying etiology    -CT head ordered   Hypovolemic hypotonic hyponatremia    - Na 102 serum osm 221  - s/p nephrology consult order- will see in am  For now start 3NS a 40cc/hr       Q2h na check - elevate Na by 8-10 meq/24h           -hold hydrochlorothiazide, hold zoloft, hold NSAIDs        - serum lipid panel/TG, TSH, ACTH, Cortisol, urine osm and Na, magnesium in process        -repeat CBC due to lab interference result if lipemic appearance start exchange transfusion   Severe hyperglycemia    Blood glucose >300 - continue levemir/humolog weight based regimen  GI/Nutrition GI PROPHYLAXIS as indicated DIET-->TF's as tolerated Constipation protocol as indicated  ENDO - ICU hypoglycemic\Hyperglycemia protocol -check FSBS per protocol   ELECTROLYTES -follow labs as needed -replace as needed -pharmacy consultation   DVT/GI PRX ordered -SCDs  TRANSFUSIONS AS NEEDED MONITOR FSBS ASSESS the need for LABS as needed    Critical care provider statement:   Total critical care time: 63 minutes   Performed by: Karna Christmas MD   Critical care time was exclusive of separately billable procedures and treating other patients.   Critical care was necessary to treat or prevent imminent or life-threatening deterioration.   Critical care was time spent personally by me on the following activities: development of treatment plan with patient and/or surrogate as well as nursing, discussions with consultants, evaluation of patient's response to treatment, examination of patient, obtaining history from patient or surrogate, ordering and performing treatments and interventions, ordering and review of laboratory studies, ordering and  review of radiographic studies, pulse oximetry and re-evaluation of patient's condition.    Vida Rigger, M.D.  Pulmonary & Critical Care Medicine

## 2024-01-03 NOTE — ED Triage Notes (Signed)
Pt to ED via [facility] from home for shortness of breathing and flu like symptoms. EMS reports that they were called out because pt had a CBG of 59. Pt was able to eat something and got her CBG up to 291 by the time EMS arrived. EMS reports pts SpO2 wad 90% on room air, they placed her on 3 liters with sats improving to 93%. Upon arrival to the ED pts Sats 78% on room air. Pt sats up to 987% on 4 Liters. Pt given Solu-medrol in route by EMS

## 2024-01-03 NOTE — ED Provider Notes (Signed)
Patient received in signout from Dr. Larinda Buttery pending blood work.  Presented short of breath and hypoxic, improving respiratory status on BiPAP.  Requires IV Ativan due to intermittent agitation on the BiPAP as were waiting on serum workup.  As below, metabolic panel results, after straight stick, with extraordinarily low sodium.  Due to her encephalopathy and presenting hypoxic respiratory failure, intermittent agitation, and after discussing with family, I intubate the patient and consult the ICU for admission.  Initiate hypertonic saline administration per protocols.  Marland Kitchen1-3 Lead EKG Interpretation  Performed by: Delton Prairie, MD Authorized by: Delton Prairie, MD     Interpretation: normal     ECG rate:  77   ECG rate assessment: normal     Rhythm: sinus rhythm     Ectopy: none     Conduction: normal   .Critical Care  Performed by: Delton Prairie, MD Authorized by: Delton Prairie, MD   Critical care provider statement:    Critical care time (minutes):  30   Critical care time was exclusive of:  Separately billable procedures and treating other patients   Critical care was necessary to treat or prevent imminent or life-threatening deterioration of the following conditions:  Respiratory failure, metabolic crisis and endocrine crisis   Critical care was time spent personally by me on the following activities:  Development of treatment plan with patient or surrogate, discussions with consultants, evaluation of patient's response to treatment, examination of patient, ordering and review of laboratory studies, ordering and review of radiographic studies, ordering and performing treatments and interventions, pulse oximetry, re-evaluation of patient's condition and review of old charts Procedure Name: Intubation Date/Time: 01/03/2024 8:41 PM  Performed by: Delton Prairie, MDPre-anesthesia Checklist: Patient identified, Patient being monitored, Emergency Drugs available, Timeout performed and Suction  available Oxygen Delivery Method: Non-rebreather mask Preoxygenation: Pre-oxygenation with 100% oxygen Induction Type: Rapid sequence Ventilation: Mask ventilation without difficulty Laryngoscope Size: Glidescope and 3 Tube size: 8.0 mm Number of attempts: 1 Airway Equipment and Method: Rigid stylet Placement Confirmation: ETT inserted through vocal cords under direct vision, CO2 detector and Breath sounds checked- equal and bilateral Secured at: 23 cm Tube secured with: Tape       Clinical Course as of 01/03/24 2040  Sun Jan 03, 2024  1735 After BMP finally results, a lab draw straight stick, I'm told of low sodium. I immediately reassess patient. 2 sons remain at the bedside. I describe low sodium and my concerns. I recommend intubation, mechanical vent and ICU admission. They seem to understand severity of situation, and agree [DS]  1744 I consult with Dr. Karna Christmas, ICU, he will come see for admission [DS]  1804 intubated [DS]    Clinical Course User Index [DS] Delton Prairie, MD       Delton Prairie, MD 01/03/24 2041

## 2024-01-04 DIAGNOSIS — E871 Hypo-osmolality and hyponatremia: Secondary | ICD-10-CM | POA: Diagnosis not present

## 2024-01-04 DIAGNOSIS — J101 Influenza due to other identified influenza virus with other respiratory manifestations: Secondary | ICD-10-CM | POA: Diagnosis not present

## 2024-01-04 DIAGNOSIS — G928 Other toxic encephalopathy: Secondary | ICD-10-CM

## 2024-01-04 DIAGNOSIS — I5023 Acute on chronic systolic (congestive) heart failure: Secondary | ICD-10-CM

## 2024-01-04 DIAGNOSIS — R739 Hyperglycemia, unspecified: Secondary | ICD-10-CM

## 2024-01-04 DIAGNOSIS — J9601 Acute respiratory failure with hypoxia: Secondary | ICD-10-CM | POA: Diagnosis not present

## 2024-01-04 LAB — CBC
HCT: 29.2 % — ABNORMAL LOW (ref 36.0–46.0)
Hemoglobin: 10.9 g/dL — ABNORMAL LOW (ref 12.0–15.0)
MCH: 29.7 pg (ref 26.0–34.0)
MCHC: 37.3 g/dL — ABNORMAL HIGH (ref 30.0–36.0)
MCV: 79.6 fL — ABNORMAL LOW (ref 80.0–100.0)
Platelets: 299 10*3/uL (ref 150–400)
RBC: 3.67 MIL/uL — ABNORMAL LOW (ref 3.87–5.11)
RDW: 11.9 % (ref 11.5–15.5)
WBC: 21.3 10*3/uL — ABNORMAL HIGH (ref 4.0–10.5)
nRBC: 0 % (ref 0.0–0.2)

## 2024-01-04 LAB — GLUCOSE, CAPILLARY
Glucose-Capillary: 100 mg/dL — ABNORMAL HIGH (ref 70–99)
Glucose-Capillary: 108 mg/dL — ABNORMAL HIGH (ref 70–99)
Glucose-Capillary: 148 mg/dL — ABNORMAL HIGH (ref 70–99)
Glucose-Capillary: 161 mg/dL — ABNORMAL HIGH (ref 70–99)
Glucose-Capillary: 168 mg/dL — ABNORMAL HIGH (ref 70–99)
Glucose-Capillary: 170 mg/dL — ABNORMAL HIGH (ref 70–99)
Glucose-Capillary: 182 mg/dL — ABNORMAL HIGH (ref 70–99)
Glucose-Capillary: 197 mg/dL — ABNORMAL HIGH (ref 70–99)
Glucose-Capillary: 198 mg/dL — ABNORMAL HIGH (ref 70–99)
Glucose-Capillary: 223 mg/dL — ABNORMAL HIGH (ref 70–99)
Glucose-Capillary: 271 mg/dL — ABNORMAL HIGH (ref 70–99)
Glucose-Capillary: 272 mg/dL — ABNORMAL HIGH (ref 70–99)
Glucose-Capillary: 274 mg/dL — ABNORMAL HIGH (ref 70–99)
Glucose-Capillary: 328 mg/dL — ABNORMAL HIGH (ref 70–99)
Glucose-Capillary: 349 mg/dL — ABNORMAL HIGH (ref 70–99)
Glucose-Capillary: 361 mg/dL — ABNORMAL HIGH (ref 70–99)
Glucose-Capillary: 362 mg/dL — ABNORMAL HIGH (ref 70–99)
Glucose-Capillary: 425 mg/dL — ABNORMAL HIGH (ref 70–99)

## 2024-01-04 LAB — BLOOD GAS, ARTERIAL
Acid-Base Excess: 1.6 mmol/L (ref 0.0–2.0)
Bicarbonate: 28.9 mmol/L — ABNORMAL HIGH (ref 20.0–28.0)
FIO2: 60 %
MECHVT: 500 mL
Mechanical Rate: 15
O2 Saturation: 97.5 %
PEEP: 5 cmH2O
Patient temperature: 37
pCO2 arterial: 56 mm[Hg] — ABNORMAL HIGH (ref 32–48)
pH, Arterial: 7.32 — ABNORMAL LOW (ref 7.35–7.45)
pO2, Arterial: 82 mm[Hg] — ABNORMAL LOW (ref 83–108)

## 2024-01-04 LAB — COMPREHENSIVE METABOLIC PANEL
ALT: 41 U/L (ref 0–44)
AST: 50 U/L — ABNORMAL HIGH (ref 15–41)
Albumin: 2.6 g/dL — ABNORMAL LOW (ref 3.5–5.0)
Alkaline Phosphatase: 49 U/L (ref 38–126)
Anion gap: 7 (ref 5–15)
BUN: 11 mg/dL (ref 8–23)
CO2: 27 mmol/L (ref 22–32)
Calcium: 7.8 mg/dL — ABNORMAL LOW (ref 8.9–10.3)
Chloride: 74 mmol/L — ABNORMAL LOW (ref 98–111)
Creatinine, Ser: 0.8 mg/dL (ref 0.44–1.00)
GFR, Estimated: >60 mL/min (ref >60)
Glucose, Bld: 190 mg/dL — ABNORMAL HIGH (ref 70–99)
Potassium: 3.8 mmol/L (ref 3.5–5.1)
Sodium: 108 mmol/L — CL (ref 135–145)
Total Bilirubin: 0.8 mg/dL (ref 0.0–1.2)
Total Protein: 5.7 g/dL — ABNORMAL LOW (ref 6.5–8.1)

## 2024-01-04 LAB — SODIUM
Sodium: 103 mmol/L — CL (ref 135–145)
Sodium: 108 mmol/L — CL (ref 135–145)
Sodium: 109 mmol/L — CL (ref 135–145)
Sodium: 112 mmol/L — CL (ref 135–145)
Sodium: 109 mmol/L — CL (ref 135–145)
Sodium: 111 mmol/L — CL (ref 135–145)
Sodium: 112 mmol/L — CL (ref 135–145)
Sodium: 109 mmol/L — CL (ref 135–145)

## 2024-01-04 LAB — STREP PNEUMONIAE URINARY ANTIGEN: Strep Pneumo Urinary Antigen: NEGATIVE

## 2024-01-04 LAB — MAGNESIUM: Magnesium: 2.1 mg/dL (ref 1.7–2.4)

## 2024-01-04 LAB — URINE DRUG SCREEN, QUALITATIVE (ARMC ONLY)
Amphetamines, Ur Screen: NOT DETECTED
Barbiturates, Ur Screen: NOT DETECTED
Benzodiazepine, Ur Scrn: NOT DETECTED
Cannabinoid 50 Ng, Ur ~~LOC~~: NOT DETECTED
Cocaine Metabolite,Ur ~~LOC~~: NOT DETECTED
MDMA (Ecstasy)Ur Screen: NOT DETECTED
Methadone Scn, Ur: NOT DETECTED
Opiate, Ur Screen: NOT DETECTED
Phencyclidine (PCP) Ur S: NOT DETECTED
Tricyclic, Ur Screen: NOT DETECTED

## 2024-01-04 LAB — LACTIC ACID, PLASMA: Lactic Acid, Venous: 1 mmol/L (ref 0.5–1.9)

## 2024-01-04 LAB — CORTISOL: Cortisol, Plasma: 22.1 ug/dL

## 2024-01-04 LAB — HEMOGLOBIN A1C
Hgb A1c MFr Bld: 6.9 % — ABNORMAL HIGH (ref 4.8–5.6)
Mean Plasma Glucose: 151.33 mg/dL

## 2024-01-04 LAB — LDL CHOLESTEROL, DIRECT: Direct LDL: 32 mg/dL (ref 0–99)

## 2024-01-04 LAB — CORTISOL-AM, BLOOD: Cortisol - AM: 11.1 ug/dL (ref 6.7–22.6)

## 2024-01-04 LAB — HIV ANTIBODY (ROUTINE TESTING W REFLEX): HIV Screen 4th Generation wRfx: NONREACTIVE

## 2024-01-04 LAB — PHOSPHORUS: Phosphorus: 3.3 mg/dL (ref 2.5–4.6)

## 2024-01-04 MED ORDER — DESMOPRESSIN ACETATE 4 MCG/ML IJ SOLN
2.0000 ug | Freq: Once | INTRAMUSCULAR | Status: AC
  Administered 2024-01-04: 2 ug via INTRAVENOUS
  Filled 2024-01-04: qty 1

## 2024-01-04 MED ORDER — INSULIN GLARGINE-YFGN 100 UNIT/ML ~~LOC~~ SOLN
20.0000 [IU] | Freq: Two times a day (BID) | SUBCUTANEOUS | Status: DC
  Administered 2024-01-04 (×2): 20 [IU] via SUBCUTANEOUS
  Filled 2024-01-04 (×3): qty 0.2

## 2024-01-04 MED ORDER — DEXTROSE IN LACTATED RINGERS 5 % IV SOLN: INTRAVENOUS | Status: DC

## 2024-01-04 MED ORDER — DEXTROSE 50 % IV SOLN
0.0000 mL | INTRAVENOUS | Status: DC | PRN
  Filled 2024-01-04: qty 50

## 2024-01-04 MED ORDER — CHLORHEXIDINE GLUCONATE CLOTH 2 % EX PADS
6.0000 | MEDICATED_PAD | Freq: Every day | CUTANEOUS | Status: DC
  Administered 2024-01-04 – 2024-01-09 (×5): 6 via TOPICAL

## 2024-01-04 MED ORDER — SODIUM CHLORIDE 3 % IV SOLN: 600.0000 mL/h | Freq: Once | INTRAVENOUS | Status: DC

## 2024-01-04 MED ORDER — POTASSIUM CHLORIDE 10 MEQ/100ML IV SOLN
10.0000 meq | INTRAVENOUS | Status: AC
  Administered 2024-01-04 (×2): 10 meq via INTRAVENOUS
  Filled 2024-01-04 (×2): qty 100

## 2024-01-04 MED ORDER — SODIUM CHLORIDE 0.9 % IV SOLN
1.0000 g | INTRAVENOUS | Status: AC
  Administered 2024-01-04 – 2024-01-08 (×5): 1 g via INTRAVENOUS
  Filled 2024-01-04 (×5): qty 10

## 2024-01-04 MED ORDER — SODIUM CHLORIDE 0.9 % IV SOLN
500.0000 mg | INTRAVENOUS | Status: DC
  Administered 2024-01-04 – 2024-01-07 (×4): 500 mg via INTRAVENOUS
  Filled 2024-01-04 (×6): qty 5

## 2024-01-04 MED ORDER — SODIUM CHLORIDE 3 % IV SOLN
600.0000 mL/h | Freq: Once | INTRAVENOUS | Status: AC
  Administered 2024-01-04: 600 mL/h via INTRAVENOUS
  Filled 2024-01-04: qty 500

## 2024-01-04 MED ORDER — DEXTROSE 5 % IV BOLUS
250.0000 mL | Freq: Once | INTRAVENOUS | Status: AC
  Administered 2024-01-04: 250 mL via INTRAVENOUS

## 2024-01-04 MED ORDER — NOREPINEPHRINE 4 MG/250ML-% IV SOLN
0.0000 ug/min | INTRAVENOUS | Status: DC
  Administered 2024-01-04 – 2024-01-05 (×2): 2 ug/min via INTRAVENOUS
  Filled 2024-01-04 (×2): qty 250

## 2024-01-04 MED ORDER — DEXMEDETOMIDINE HCL IN NACL 400 MCG/100ML IV SOLN
0.0000 ug/kg/h | INTRAVENOUS | Status: DC
  Administered 2024-01-04: 0.3 ug/kg/h via INTRAVENOUS
  Administered 2024-01-04 – 2024-01-05 (×2): 0.4 ug/kg/h via INTRAVENOUS
  Filled 2024-01-04 (×2): qty 100

## 2024-01-04 MED ORDER — POTASSIUM CHLORIDE 20 MEQ PO PACK
40.0000 meq | PACK | Freq: Once | ORAL | Status: AC
  Administered 2024-01-04: 40 meq
  Filled 2024-01-04: qty 2

## 2024-01-04 MED ORDER — INSULIN ASPART 100 UNIT/ML IJ SOLN
0.0000 [IU] | INTRAMUSCULAR | Status: DC
  Administered 2024-01-04: 4 [IU] via SUBCUTANEOUS
  Administered 2024-01-04: 5 [IU] via SUBCUTANEOUS
  Administered 2024-01-05 (×2): 1 [IU] via SUBCUTANEOUS
  Administered 2024-01-05: 3 [IU] via SUBCUTANEOUS
  Administered 2024-01-05: 2 [IU] via SUBCUTANEOUS
  Administered 2024-01-06: 3 [IU] via SUBCUTANEOUS
  Administered 2024-01-06 – 2024-01-07 (×3): 2 [IU] via SUBCUTANEOUS
  Administered 2024-01-07: 6 [IU] via SUBCUTANEOUS
  Filled 2024-01-04 (×11): qty 1

## 2024-01-04 MED ORDER — INSULIN REGULAR(HUMAN) IN NACL 100-0.9 UT/100ML-% IV SOLN
INTRAVENOUS | Status: DC
  Administered 2024-01-04: 13 [IU]/h via INTRAVENOUS
  Filled 2024-01-04 (×2): qty 100

## 2024-01-04 MED ORDER — INSULIN ASPART 100 UNIT/ML IJ SOLN
0.0000 [IU] | INTRAMUSCULAR | Status: DC
  Administered 2024-01-04: 1 [IU] via SUBCUTANEOUS

## 2024-01-04 NOTE — Procedures (Signed)
CENTRAL VENOUS CATHETER INSERTION PROCEDURE NOTE  JACKLIN ZWICK  [id number]  [birth year]  Date:01/04/24  Time:1:11 AM   Provider Performing:Tisa Weisel A May Manrique   Procedure: Insertion of Non-tunneled Central Venous [phone]) with US guidance (29518)   Indication(s) Medication administration and Difficult access  Consent Risks of the procedure as well as the alternatives and risks of each were explained to the patient and/or caregiver.  Consent for the procedure was obtained and is signed in the bedside chart  Anesthesia Topical only with 1% lidocaine   Timeout Verified patient identification, verified procedure, site/side was marked, verified correct patient position, special equipment/implants available, medications/allergies/relevant history reviewed, required imaging and test results available.  Sterile Technique Maximal sterile technique including full sterile barrier drape, hand hygiene, sterile gown, sterile gloves, mask, hair covering, sterile ultrasound probe cover (if used).  Procedure Description Area of catheter insertion was cleaned with chlorhexidine and draped in sterile fashion.  With real-time ultrasound guidance a central venous catheter was placed into the right internal jugular vein. Nonpulsatile blood flow and easy flushing noted in all ports.  The catheter was sutured in place and sterile dressing applied.  Complications/Tolerance None; patient tolerated the procedure well. Chest X-ray is ordered to verify placement for internal jugular or subclavian cannulation.   Chest x-ray is not ordered for femoral cannulation.  EBL Minimal  Specimen(s) None   [name], DNP, CCRN, FNP-C, AGACNP-BC Acute Care & Family Nurse Practitioner  [facility]  See Amion for personal pager PCCM on call pager [phone] until 7 am

## 2024-01-04 NOTE — Progress Notes (Signed)
NAME:  Jill Crosby, MRN:  [id number], DOB:  06/25/1961, LOS: 1 ADMISSION DATE:  01/03/2024, CHIEF COMPLAINT:  Hyponatremia, Encephalopathy   History of Present Illness:   This is 63 year old female with a history of type 1 diabetes since age 37, previously on insulin pump which was switched to subcutaneous injections years ago.  She had an URTI recently with respiratory symptoms and worsening encephalopathy prompting presentation to the hospital.  History was obtained from family members who provided collateral information given intubation and mechanical ventilation. Additional background medical history includes essential hypertension, dyslipidemia, anxiety disorder, history of smoking, arthritis, morbid obesity and glaucoma. She presents with N/V and altered mental status after coming down with a cold over a week ago. She tested positive for influenza A in the ED. BMP showed hyperglycemia at 300 and severe hyponatremia and undetectably low levels less than 102 with hypochloremia.  She was encephalopathic and agitated and was intubated in the ED. PCCM was consulted for admission for management of encephalopathy, respiratory failure, and hyponatremia.  Pertinent  Medical History  -T1DM -HTN -HLD -Obesity  Significant Hospital Events: Including procedures, antibiotic start and stop dates in addition to other pertinent events   01/03/2024: admit to hospital, intubated, severe hyponatremia 01/04/2024: remains intubated, follows commands. Sodium 109-111  Interim History / Subjective:  Patient arouses to verbal stimuli and follows commands  Objective   Blood pressure (!) 108/50, pulse (!) 53, temperature 99 F (37.2 C), temperature source Bladder, resp. rate 11, height 5\' 4"  (1.626 m), weight 120.1 kg, SpO2 98%.    Vent Mode: PSV FiO2 (%):  [50 %-100 %] 50 % Set Rate:  [15 bmp-18 bmp] 18 bmp Vt Set:  [500 mL] 500 mL PEEP:  [5 cmH20-7 cmH20] 7 cmH20 Pressure Support:  [5 cmH20-7 cmH20] 5  cmH20 Plateau Pressure:  [25 cmH20-28 cmH20] 25 cmH20   Intake/Output Summary (Last 24 hours) at 01/04/2024 1618 Last data filed at 01/04/2024 1501 Gross per 24 hour  Intake 5107.92 ml  Output 4540 ml  Net 567.92 ml   Filed Weights   01/03/24 1444 01/03/24 2021 01/04/24 0500  Weight: 122 kg 114.1 kg 120.1 kg    Examination: Physical Exam Constitutional:      General: She is not in acute distress.    Appearance: She is obese. She is ill-appearing.  Cardiovascular:     Rate and Rhythm: Normal rate and regular rhythm.     Heart sounds: Normal heart sounds.  Pulmonary:     Breath sounds: No wheezing or rales.     Comments: Ventilated breath sounds bilaterally Neurological:     Mental Status: She is disoriented.      Assessment & Plan:   #Acute Hypoxic Respiratory Failure #Severe Symptomatic Hyponatremia #Toxic Metabolic Encephalopathy #Severe Hyperglycemia without DKA #Influenza A infection #Acute on Chronic HFpEF  63 year old female with a history of T1DM presents with a few days history of URTI found to have influenza pneumonia and toxic metabolic encephalopathy secondary to severe hyponatremia. This is likely to osmotic diuresis, hydrochlorothiazide use, SSRI use, and decreased PO intake. She was intubated for respiratory failure and encephalopathy.  Neuro - on Fentanyl and Dexmedetomidine for analgosedation with goal RASS of -1. Does follow commands on WUA. Holding SSRI given hyponatremia. CV - required vasopressor support secondary to sedation related hypotension. Lactic acid within normal and is overall well perfused. Elevated BNP with signs of volume overload suggests heart failure, though no previous echo. Will obtain TTE but hold on  diuresis given severe hyponatremia. Pulm - Respiratory failure following influenza infection with chest xray showing increased interstitial markings. This could represent findings secondary to influenza vs findings of decompensated heart  failure given elevated BNP. Holding off on diuresis given hyponatremia. Continue with daily SBT, will likely be ready for extubation in the AM. GI - NPO for now, on famotidine for prophylaxis Renal - severe hyponatremia, initially < 102. Goal of 6 to 8 in 24 hours. Mild overcorrection that we are controlling with D5W boluses (x 2) and ddavp, holding LR and hypertonic saline for now. Goal is Na 109-111 by 8 pm tonight. Appreciate input from nephrology, workup is pending. Endo - switch from insulin gtt to basal bolus given severe hyponatremia and to decrease obligate fluid intake. Hem/Onc - heparin subQ for DVT prophylaxis ID - influenza infection, will re-send swab per ID's recommendations. Will continue antibiotics for CAP coverage. Continue Tamiflu.  Best Practice (right click and "Reselect all SmartList Selections" daily)   Diet/type: NPO DVT prophylaxis prophylactic heparin  Pressure ulcer(s): N/A GI prophylaxis: H2B Lines: Central line, Arterial Line, and yes and it is still needed Foley:  Yes, and it is still needed Code Status:  full code Last date of multidisciplinary goals of care discussion [01/04/2024]  Labs   CBC: Recent Labs  Lab 01/03/24 1633 01/03/24 2036 01/04/24 0413  WBC 23.1* 22.7* 21.3*  NEUTROABS 20.6*  --   --   HGB 12.0 12.4 10.9*  HCT RESULTS UNAVAILABLE DUE TO INTERFERING SUBSTANCE RESULTS UNAVAILABLE DUE TO INTERFERING SUBSTANCE 29.2*  MCV RESULTS UNAVAILABLE DUE TO INTERFERING SUBSTANCE RESULTS UNAVAILABLE DUE TO INTERFERING SUBSTANCE 79.6*  PLT 323 291 299    Basic Metabolic Panel: Recent Labs  Lab 01/03/24 1633 01/03/24 1735 01/03/24 1840 01/03/24 2036 01/04/24 0106 01/04/24 0413 01/04/24 0532 01/04/24 0947 01/04/24 1332 01/04/24 1340  NA <102*   < >  --  103*   < > 108* 109* 112* 109* 111*  K 3.2*  --   --   --   --  3.8  --   --   --   --   CL <65*  --   --   --   --  74*  --   --   --   --   CO2 24  --   --   --   --  27  --   --   --   --    GLUCOSE 324*  --   --   --   --  190*  --   --   --   --   BUN 10  --   --   --   --  11  --   --   --   --   CREATININE 0.63  --  0.64  --   --  0.80  --   --   --   --   CALCIUM 8.0*  --   --   --   --  7.8*  --   --   --   --   MG  --   --   --  2.2  --  2.1  --   --   --   --   PHOS  --   --   --   --   --  3.3  --   --   --   --    < > = values in this interval not displayed.  GFR: Estimated Creatinine Clearance: 90.7 mL/min (by C-G formula based on SCr of 0.8 mg/dL). Recent Labs  Lab 01/03/24 1633 01/03/24 1738 01/03/24 1840 01/03/24 2036 01/03/24 2048 01/04/24 0341 01/04/24 0413  PROCALCITON  --   --  0.23  --   --   --   --   WBC 23.1*  --   --  22.7*  --   --  21.3*  LATICACIDVEN  --  1.0  --   --  1.3 1.0  --     Liver Function Tests: Recent Labs  Lab 01/03/24 1633 01/04/24 0413  AST 90* 50*  ALT 56* 41  ALKPHOS 56 49  BILITOT 1.4* 0.8  PROT 6.8 5.7*  ALBUMIN 3.4* 2.6*   Recent Labs  Lab 01/03/24 1840  LIPASE 26   No results for input(s): "AMMONIA" in the last 168 hours.  ABG    Component Value Date/Time   PHART 7.32 (L) 01/04/2024 0418   PCO2ART 56 (H) 01/04/2024 0418   PO2ART 82 (L) 01/04/2024 0418   HCO3 28.9 (H) 01/04/2024 0418   O2SAT 97.5 01/04/2024 0418     Coagulation Profile: No results for input(s): "INR", "PROTIME" in the last 168 hours.  Cardiac Enzymes: No results for input(s): "CKTOTAL", "CKMB", "CKMBINDEX", "TROPONINI" in the last 168 hours.  HbA1C: Hemoglobin A1C  Date/Time Value Ref Range Status  07/25/2021 12:00 AM 7.0  Final   Hgb A1c MFr Bld  Date/Time Value Ref Range Status  01/04/2024 01:06 AM 6.9 (H) 4.8 - 5.6 % Final    Comment:    (NOTE) Pre diabetes:          5.7%-6.4%  Diabetes:              >6.4%  Glycemic control for   <7.0% adults with diabetes   03/24/2023 11:07 AM 7.1 (H) 4.8 - 5.6 % Final    Comment:             Prediabetes: 5.7 - 6.4          Diabetes: >6.4          Glycemic control for  adults with diabetes: <7.0     CBG: Recent Labs  Lab 01/04/24 1104 01/04/24 1208 01/04/24 1321 01/04/24 1416 01/04/24 1522  GLUCAP 170* 168* 274* 271* 198*    Review of Systems:   Unable to obtain  Past Medical History:  She,  has a past medical history of Anxiety, Depression, Elevated BP, Hypercholesterolemia, Increased BMI, Menopause, Morbid obesity (HCC), and Type I diabetes mellitus (HCC).   Surgical History:   Past Surgical History:  Procedure Laterality Date   BREAST BIOPSY Left 05/09/2020   ribbon clip, stereo bx, HEMANGIOMA, BENIGN. - MAMMARY EPITHELIUM IS NOT PRESENT.   CATARACT EXTRACTION W/PHACO Left 11/08/2020   Procedure: CATARACT EXTRACTION PHACO AND INTRAOCULAR LENS PLACEMENT (IOC) LEFT DIABETIC 4.14 00:52.1 ;  Surgeon: Galen Manila, MD;  Location: [facility];  Service: Ophthalmology;  Laterality: Left;   CATARACT EXTRACTION W/PHACO Right 12/04/2020   Procedure: CATARACT EXTRACTION PHACO AND INTRAOCULAR LENS PLACEMENT (IOC) RIGHT DIABETIC 6.11 00:46.1;  Surgeon: Galen Manila, MD;  Location: [facility];  Service: Ophthalmology;  Laterality: Right;  Diabetic - insulin   CESAREAN SECTION  1997   COLONOSCOPY WITH PROPOFOL N/A 01/18/2021   Procedure: COLONOSCOPY WITH PROPOFOL;  Surgeon: Midge Minium, MD;  Location: [facility];  Service: Endoscopy;  Laterality: N/A;  priority 4     Social History:   reports that she  has quit smoking. Her smoking use included cigarettes. She has a 10 pack-year smoking history. She has never used smokeless tobacco. She reports that she does not drink alcohol and does not use drugs.   Family History:  Her family history includes Breast cancer in her maternal aunt and maternal grandmother; Cancer in her father; Colon cancer in her maternal grandfather and paternal grandfather; Colon polyps in her mother; Diabetes in her maternal grandfather; Hyperlipidemia in her mother; Hypertension in her mother.  There is no history of Heart disease or Ovarian cancer.   Allergies Allergies  Allergen Reactions   Codeine Nausea And Vomiting     Home Medications  Prior to Admission medications   Medication Sig Start Date End Date Taking? Authorizing Provider  meloxicam (MOBIC) 7.5 MG tablet Take 7.5 mg by mouth daily. 09/07/23  Yes [provider]  amLODipine (NORVASC) 10 MG tablet TAKE 1 TABLET BY MOUTH DAILY 08/05/23   Dale Bradford, MD  BAYER MICROLET LANCETS lancets USE AS DIRECTED TO CHECK  SUGARS 1 TO 2 TIMES A DAY 01/15/18   Dale Henry, MD  cholecalciferol (VITAMIN D3) 25 MCG (1000 UT) tablet Take 1,000 Units by mouth daily.    [provider]  CONTOUR NEXT TEST test strip USE TO CHECK BLOOD GLUCOSE 6 TO  7 TIMES DAILY 07/10/23   Dale [place], MD  Cyanocobalamin (B-12 PO) Take by mouth.    [provider]  hydrochlorothiazide (HYDRODIURIL) 25 MG tablet TAKE 1 TABLET BY MOUTH DAILY 03/03/23   Dale [place], MD  insulin glargine (LANTUS SOLOSTAR) 100 UNIT/ML Solostar Pen INJECT SUBCUTANEOUSLY 80 UNITS  AT NIGHT 12/11/23   Dale [place], MD  insulin lispro (HUMALOG KWIKPEN) 100 UNIT/ML KwikPen INJECT SUBCUTANEOUSLY 65  UNITS DAILY.  (Takes distributed over three meals counting carbs) 07/15/23   Dale Jette, MD  Insulin Pen Needle 32G X 4 MM MISC Use as directed with insulin 4 x daily. 07/15/23   Dale Peyton, MD  levocetirizine (XYZAL) 5 MG tablet Take 1 tablet (5 mg total) by mouth every evening. 03/17/23   Dale Holloway, MD  lisinopril (ZESTRIL) 40 MG tablet Take 1 tablet (40 mg total) by mouth daily. 08/05/23   Dale [place], MD  REPATHA SURECLICK 140 MG/ML SOAJ INJECT 1 PEN SUBCUTANEOUSLY  EVERY 2 WEEKS 12/30/23   Dale [place], MD  sertraline (ZOLOFT) 50 MG tablet TAKE 1 TABLET BY MOUTH DAILY 03/03/23   Dale Bush, MD     Critical care time: 46 minutes     Raechel Chute, MD [facility] 01/04/2024 5:03 PM

## 2024-01-04 NOTE — Procedures (Signed)
ARTERIAL CATHETER INSERTION PROCEDURE NOTE  Jill Crosby  [id number]  04-10-1961  Date:01/04/24  Time:6:28 AM   Provider Performing: Loraine Leriche   Procedure: Insertion of Arterial Line (40981) with US guidance (19147)   Indication(s) Blood pressure monitoring and/or need for frequent ABGs  Consent Risks of the procedure as well as the alternatives and risks of each were explained to the patient and/or caregiver.  Consent for the procedure was obtained and is signed in the bedside chart  Anesthesia None  Time Out Verified patient identification, verified procedure, site/side was marked, verified correct patient position, special equipment/implants available, medications/allergies/relevant history reviewed, required imaging and test results available.  Sterile Technique Maximal sterile technique including full sterile barrier drape, hand hygiene, sterile gown, sterile gloves, mask, hair covering, sterile ultrasound probe cover (if used).  Procedure Description Area of catheter insertion was cleaned with chlorhexidine and draped in sterile fashion. Without real-time ultrasound guidance an arterial catheter was placed into the left radial artery.  Appropriate arterial tracings confirmed on monitor.    Complications/Tolerance None; patient tolerated the procedure well.  EBL Minimal  Specimen(s) None  [name], DNP, CCRN, FNP-C, AGACNP-BC Acute Care & Family Nurse Practitioner  [facility]  See Amion for personal pager PCCM on call pager [phone] until 7 am

## 2024-01-04 NOTE — Consult Note (Signed)
PHARMACY CONSULT NOTE - ELECTROLYTES  Pharmacy Consult for Electrolyte Monitoring and Replacement   Recent Labs: Potassium (mmol/L)  Date Value  01/04/2024 3.8   Magnesium (mg/dL)  Date Value  16/09/9603 2.1   Calcium (mg/dL)  Date Value  54/08/8118 7.8 (L)   Albumin (g/dL)  Date Value  14/78/2956 2.6 (L)  03/24/2023 4.2   Phosphorus (mg/dL)  Date Value  21/30/8657 3.3   Sodium (mmol/L)  Date Value  01/04/2024 112 (LL)  04/29/2023 136    Height: 5\' 4"  (162.6 cm) Weight: 120.1 kg (264 lb 12.4 oz) IBW/kg (Calculated) : 54.7 Estimated Creatinine Clearance: 90.7 mL/min (by C-G formula based on SCr of 0.8 mg/dL).  Assessment  Jill Crosby is a 63 y.o. female presenting with severe hyponatremia and altered mental status. Patient is flu positive. PMH significant for T1DM, obesity, HTN. Pharmacy has been consulted to monitor and replace electrolytes.  Diet: NPO, OG tube in place MIVF: Hypertonic saline stopped Pertinent medications: N/A  Goal of Therapy: Electrolytes within normal limits  Plan:  Na < 102 on admit to 112 over ~ 16h, ~ 0.625/hr increase. Stop hypertonic saline per discussion on rounds. Give DDAVP 2 mcg IV x 1 and D5w 250 cc x 1 K 3.8, Kcl 40 mEq per tube x 1 dose Continue sodium checks q4h for now. Re-check BMP, Mg, Phos tomorrow AM  Thank you for allowing pharmacy to be a part of this patient's care.  Tressie Ellis 01/04/2024 11:59 AM

## 2024-01-04 NOTE — Plan of Care (Signed)
  Problem: Education: Goal: Knowledge of General Education information will improve Description: Including pain rating scale, medication(s)/side effects and non-pharmacologic comfort measures Outcome: Progressing   Problem: Health Behavior/Discharge Planning: Goal: Ability to manage health-related needs will improve Outcome: Progressing   Problem: Clinical Measurements: Goal: Ability to maintain clinical measurements within normal limits will improve Outcome: Progressing Goal: Will remain free from infection Outcome: Progressing Goal: Diagnostic test results will improve Outcome: Progressing Goal: Respiratory complications will improve Outcome: Progressing Goal: Cardiovascular complication will be avoided Outcome: Progressing   Problem: Nutrition: Goal: Adequate nutrition will be maintained Outcome: Progressing   Problem: Coping: Goal: Level of anxiety will decrease Outcome: Progressing   Problem: Elimination: Goal: Will not experience complications related to bowel motility Outcome: Progressing Goal: Will not experience complications related to urinary retention Outcome: Progressing   Problem: Pain Managment: Goal: General experience of comfort will improve and/or be controlled Outcome: Progressing   Problem: Safety: Goal: Ability to remain free from injury will improve Outcome: Progressing   Problem: Skin Integrity: Goal: Risk for impaired skin integrity will decrease Outcome: Progressing   Problem: Education: Goal: Ability to describe self-care measures that may prevent or decrease complications (Diabetes Survival Skills Education) will improve Outcome: Progressing Goal: Individualized Educational Video(s) Outcome: Progressing   Problem: Coping: Goal: Ability to adjust to condition or change in health will improve Outcome: Progressing   Problem: Fluid Volume: Goal: Ability to maintain a balanced intake and output will improve Outcome: Progressing    Problem: Health Behavior/Discharge Planning: Goal: Ability to identify and utilize available resources and services will improve Outcome: Progressing Goal: Ability to manage health-related needs will improve Outcome: Progressing   Problem: Metabolic: Goal: Ability to maintain appropriate glucose levels will improve Outcome: Progressing   Problem: Nutritional: Goal: Maintenance of adequate nutrition will improve Outcome: Progressing Goal: Progress toward achieving an optimal weight will improve Outcome: Progressing   Problem: Skin Integrity: Goal: Risk for impaired skin integrity will decrease Outcome: Progressing   Problem: Tissue Perfusion: Goal: Adequacy of tissue perfusion will improve Outcome: Progressing   Problem: Education: Goal: Ability to describe self-care measures that may prevent or decrease complications (Diabetes Survival Skills Education) will improve Outcome: Progressing Goal: Individualized Educational Video(s) Outcome: Progressing   Problem: Cardiac: Goal: Ability to maintain an adequate cardiac output will improve Outcome: Progressing   Problem: Health Behavior/Discharge Planning: Goal: Ability to identify and utilize available resources and services will improve Outcome: Progressing Goal: Ability to manage health-related needs will improve Outcome: Progressing   Problem: Fluid Volume: Goal: Ability to achieve a balanced intake and output will improve Outcome: Progressing   Problem: Metabolic: Goal: Ability to maintain appropriate glucose levels will improve Outcome: Progressing   Problem: Nutritional: Goal: Maintenance of adequate nutrition will improve Outcome: Progressing Goal: Maintenance of adequate weight for body size and type will improve Outcome: Progressing   Problem: Respiratory: Goal: Will regain and/or maintain adequate ventilation Outcome: Progressing   Problem: Urinary Elimination: Goal: Ability to achieve and maintain  adequate renal perfusion and functioning will improve Outcome: Progressing

## 2024-01-04 NOTE — Progress Notes (Signed)
[facility] CONSULT NOTE    Date: 01/04/2024                  Patient Name:  Jill Crosby  MRN: [id number]  DOB: 22-May-1961  Age / Sex: 63 y.o., female         PCP: Dale Vernon, MD                 Service Requesting Consult:                  Reason for Consult:Hyponatremia.             History of Present Illness: Spoke to patient's son at bedside in the ICU. Patient is a 63 y.o. female with a PMHx of type 1 diabetes on insulin, hypertension, congestive heart failure, obesity, depression and hyperlipidemia now presented to the emergency room with history of productive cough, shortness of breath associated with decreased p.o. intake and nausea vomiting.  She is found to have influenza A infection and was in respiratory distress.  She was subsequently intubated and sedated.  She has been on hydrochlorothiazide and also SSRIs at home.  She is found to have a sodium of less than 102 along with potassium of 3.2.  She was started on hypertonic saline at 40 cc an hour.  Sodium is being repeated every 2 hours.  Her magnesium levels are acceptable.  Medications: Outpatient medications: Medications Prior to Admission  Medication Sig Dispense Refill Last Dose/Taking   meloxicam (MOBIC) 7.5 MG tablet Take 7.5 mg by mouth daily.   Taking   amLODipine (NORVASC) 10 MG tablet TAKE 1 TABLET BY MOUTH DAILY 90 tablet 3    BAYER MICROLET LANCETS lancets USE AS DIRECTED TO CHECK  SUGARS 1 TO 2 TIMES A DAY 300 each 2    cholecalciferol (VITAMIN D3) 25 MCG (1000 UT) tablet Take 1,000 Units by mouth daily.      CONTOUR NEXT TEST test strip USE TO CHECK BLOOD GLUCOSE 6 TO  7 TIMES DAILY 700 strip 3    Cyanocobalamin (B-12 PO) Take by mouth.      hydrochlorothiazide (HYDRODIURIL) 25 MG tablet TAKE 1 TABLET BY MOUTH DAILY 90 tablet 3    insulin glargine (LANTUS SOLOSTAR) 100 UNIT/ML Solostar Pen INJECT SUBCUTANEOUSLY 80 UNITS  AT NIGHT 75 mL 0    insulin lispro (HUMALOG KWIKPEN)  100 UNIT/ML KwikPen INJECT SUBCUTANEOUSLY 65  UNITS DAILY.  (Takes distributed over three meals counting carbs) 45 mL 3    Insulin Pen Needle 32G X 4 MM MISC Use as directed with insulin 4 x daily. 200 each 3    levocetirizine (XYZAL) 5 MG tablet Take 1 tablet (5 mg total) by mouth every evening. 90 tablet 3    lisinopril (ZESTRIL) 40 MG tablet Take 1 tablet (40 mg total) by mouth daily. 90 tablet 3    REPATHA SURECLICK 140 MG/ML SOAJ INJECT 1 PEN SUBCUTANEOUSLY  EVERY 2 WEEKS 6 mL 3    sertraline (ZOLOFT) 50 MG tablet TAKE 1 TABLET BY MOUTH DAILY 90 tablet 3     Discontinued Meds:   Medications Discontinued During This Encounter  Medication Reason   magnesium sulfate IVPB 2 g 50 mL    oseltamivir (TAMIFLU) capsule 75 mg    Oral care mouth rinse    sodium chloride flush (NS) 0.9 % injection 10-40 mL    sodium chloride (hypertonic) 3 % solution Duplicate    Current medications: Current Facility-Administered Medications  Medication Dose Route Frequency Provider Last Rate Last Admin   azithromycin (ZITHROMAX) 500 mg in sodium chloride 0.9 % 250 mL IVPB  500 mg Intravenous Q24H Ouma, Hubbard Hartshorn, NP       cefTRIAXone (ROCEPHIN) 1 g in sodium chloride 0.9 % 100 mL IVPB  1 g Intravenous Q24H Ouma, Hubbard Hartshorn, NP       Chlorhexidine Gluconate Cloth 2 % PADS 6 each  6 each Topical Daily Ouma, Hubbard Hartshorn, NP       dextrose 5 % in lactated ringers infusion   Intravenous Continuous Jimmye Norman, NP 125 mL/hr at 01/04/24 1610 Infusion Verify at 01/04/24 0649   dextrose 50 % solution 0-50 mL  0-50 mL Intravenous PRN Jimmye Norman, NP       famotidine (PEPCID) tablet 20 mg  20 mg Per Tube BID Vida Rigger, MD   20 mg at 01/03/24 2140   fentaNYL in NS (81mcg/ml) infusion-PREMIX  100 mcg/hr Intravenous Continuous Delton Prairie, MD 10 mL/hr at 01/04/24 0649 100 mcg/hr at 01/04/24 0649   heparin injection 5,000 Units  5,000 Units Subcutaneous Q8H  Aleskerov, Luis Abed, MD   5,000 Units at 01/04/24 0535   insulin regular, human (MYXREDLIN) 100 units/ 100 mL infusion   Intravenous Continuous Jimmye Norman, NP 4.6 mL/hr at 01/04/24 0649 4.6 Units/hr at 01/04/24 0649   norepinephrine (LEVOPHED) 4mg  in (0.016 mg/mL) premix infusion  0-40 mcg/min Intravenous Titrated Jimmye Norman, NP 11.25 mL/hr at 01/04/24 0649 3 mcg/min at 01/04/24 9604   Oral care mouth rinse  15 mL Mouth Rinse Q2H Jimmye Norman, NP   15 mL at 01/04/24 5409   oseltamivir (TAMIFLU) capsule 75 mg  75 mg Per Tube BID Vida Rigger, MD   75 mg at 01/03/24 2140   polyethylene glycol (MIRALAX / GLYCOLAX) packet 17 g  17 g Oral Daily PRN Vida Rigger, MD       propofol (DIPRIVAN) 1000 MG/100ML infusion  5-80 mcg/kg/min Intravenous Continuous Delton Prairie, MD 7.32 mL/hr at 01/04/24 0649 10 mcg/kg/min at 01/04/24 0649   rocuronium (ZEMURON) injection 100 mg  100 mg Intravenous Once Delton Prairie, MD       sodium chloride (hypertonic) 3 % solution   Intravenous Continuous Jimmye Norman, NP 40 mL/hr at 01/04/24 0649 Infusion Verify at 01/04/24 0649   sodium chloride flush (NS) 0.9 % injection 10-40 mL  10-40 mL Intracatheter Q12H Jimmye Norman, NP   10 mL at 01/04/24 0012      Allergies: Allergies  Allergen Reactions   Codeine Nausea And Vomiting      Past Medical History: Past Medical History:  Diagnosis Date   Anxiety    Depression    Elevated BP    Hypercholesterolemia    Increased BMI    Menopause    Morbid obesity (HCC)    Type I diabetes mellitus (HCC)      Past Surgical History: Past Surgical History:  Procedure Laterality Date   BREAST BIOPSY Left 05/09/2020   ribbon clip, stereo bx, HEMANGIOMA, BENIGN. - MAMMARY EPITHELIUM IS NOT PRESENT.   CATARACT EXTRACTION W/PHACO Left 11/08/2020   Procedure: CATARACT EXTRACTION PHACO AND INTRAOCULAR LENS PLACEMENT (IOC) LEFT DIABETIC 4.14 00:52.1 ;  Surgeon:  Galen Manila, MD;  Location[facility];  Service: Ophthalmology;  Laterality: Left;   CATARACT EXTRACTION W/PHACO Right 12/04/2020   Procedure: CATARACT EXTRACTION PHACO AND INTRAOCULAR LENS PLACEMENT (IOC) RIGHT DIABETIC 6.11 00:46.1;  Surgeon: Druscilla Brownie,  Chrissie Noa, MD;  Location: [facility];  Service: Ophthalmology;  Laterality: Right;  Diabetic - insulin   CESAREAN SECTION  1997   COLONOSCOPY WITH PROPOFOL N/A 01/18/2021   Procedure: COLONOSCOPY WITH PROPOFOL;  Surgeon: Midge Minium, MD;  Location: [facility];  Service: Endoscopy;  Laterality: N/A;  priority 4     Family History: Family History  Problem Relation Age of Onset   Hypertension Mother    Hyperlipidemia Mother    Colon polyps Mother    Cancer Father        lung   Colon cancer Maternal Grandfather    Diabetes Maternal Grandfather    Breast cancer Maternal Grandmother    Colon cancer Paternal Grandfather    Breast cancer Maternal Aunt    Heart disease Neg Hx    Ovarian cancer Neg Hx      Social History: Social History   Socioeconomic History   Marital status: Single    Spouse name: Not on file   Number of children: Not on file   Years of education: Not on file   Highest education level: Not on file  Occupational History   Not on file  Tobacco Use   Smoking status: Former    Current packs/day: 0.50    Average packs/day: 0.5 packs/day for 20.0 years (10.0 ttl pk-yrs)    Types: Cigarettes   Smokeless tobacco: Never  Substance and Sexual Activity   Alcohol use: No    Alcohol/week: 0.0 standard drinks of alcohol   Drug use: No   Sexual activity: Not Currently    Birth control/protection: Post-menopausal  Other Topics Concern   Not on file  Social History Narrative   Not on file   Social Drivers of Health   Financial Resource Strain: Not on file  Food Insecurity: Patient Unable To Answer (01/03/2024)   Hunger Vital Sign    Worried About Running Out of Food in the Last Year:  Patient unable to answer    Ran Out of Food in the Last Year: Patient unable to answer  Transportation Needs: Patient Unable To Answer (01/03/2024)   PRAPARE - Transportation    Lack of Transportation (Medical): Patient unable to answer    Lack of Transportation (Non-Medical): Patient unable to answer  Physical Activity: Not on file  Stress: Not on file  Social Connections: Patient Unable To Answer (01/03/2024)   Social Connection and Isolation Panel [NHANES]    Frequency of Communication with Friends and Family: Patient unable to answer    Frequency of Social Gatherings with Friends and Family: Patient unable to answer    Attends Religious Services: Patient unable to answer    Active Member of Clubs or Organizations: Patient unable to answer    Attends Banker Meetings: Patient unable to answer    Marital Status: Patient unable to answer  Intimate Partner Violence: Patient Unable To Answer (01/03/2024)   Humiliation, Afraid, Rape, and Kick questionnaire    Fear of Current or Ex-Partner: Patient unable to answer    Emotionally Abused: Patient unable to answer    Physically Abused: Patient unable to answer    Sexually Abused: Patient unable to answer     Review of Systems: As per HPI  Vital Signs: Blood pressure (!) 96/42, pulse (!) 51, temperature 98.4 F (36.9 C), resp. rate 18, height 5\' 4"  (1.626 m), weight 120.1 kg, SpO2 96%.  Weight trends: Filed Weights   01/03/24 1444 01/03/24 2021 01/04/24 0500  Weight: 122 kg 114.1  kg 120.1 kg    Physical Exam: Physical Exam: General:  No acute distress  Head:  Normocephalic, atraumatic. Moist oral mucosal membranes  Eyes:  Anicteric  Neck:  Supple  Lungs:   Clear to auscultation, normal effort  Heart:  S1S2 no rubs  Abdomen:   Soft, nontender, bowel sounds present  Extremities:  peripheral edema.  Neurologic: Intubated and sedated.    Skin:  No lesions  Access:     Lab results:  Basic Metabolic Panel: Recent  Labs  Lab 01/03/24 1633 01/03/24 1735 01/03/24 1840 01/03/24 2036 01/04/24 0106 01/04/24 0341 01/04/24 0413 01/04/24 0532  NA <102*   < >  --  103* 103* 108* 108* 109*  K 3.2*  --   --   --   --   --  3.8  --   CL <65*  --   --   --   --   --  74*  --   CO2 24  --   --   --   --   --  27  --   GLUCOSE 324*  --   --   --   --   --  190*  --   BUN 10  --   --   --   --   --  11  --   CREATININE 0.63  --  0.64  --   --   --  0.80  --   CALCIUM 8.0*  --   --   --   --   --  7.8*  --   MG  --   --   --  2.2  --   --  2.1  --   PHOS  --   --   --   --   --   --  3.3  --    < > = values in this interval not displayed.    Creatinine, Ser  Date/Time Value Ref Range Status  01/04/2024 04:13 AM 0.80 0.44 - 1.00 mg/dL Final  16/09/9603 54:09 PM 0.64 0.44 - 1.00 mg/dL Final  81/19/1478 29:56 PM 0.63 0.44 - 1.00 mg/dL Final  21/30/8657 84:69 AM 0.85 0.57 - 1.00 mg/dL Final  62/95/2841 32:44 AM 1.16 (H) 0.57 - 1.00 mg/dL Final  12/03/7251 66:44 AM 0.93 0.57 - 1.00 mg/dL Final  03/47/4259 56:38 AM 0.99 0.57 - 1.00 mg/dL Final  75/64/3329 51:88 PM 1.00 0.57 - 1.00 mg/dL Final  41/66/0630 16:01 AM 0.89 0.57 - 1.00 mg/dL Final  09/32/3557 32:20 AM 0.98 0.57 - 1.00 mg/dL Final  25/42/7062 37:62 AM 0.97 0.57 - 1.00 mg/dL Final  83/15/1761 60:73 AM 0.73 0.57 - 1.00 mg/dL Final  71/05/2693 85:46 PM 0.99 0.57 - 1.00 mg/dL Final  27/01/5008 38:18 AM 1.10 (H) 0.57 - 1.00 mg/dL Final  29/93/7169 67:89 AM 0.95 0.57 - 1.00 mg/dL Final  38/08/1750 02:58 AM 0.87 0.57 - 1.00 mg/dL Final  52/77/8242 35:36 AM 0.76 0.57 - 1.00 mg/dL Final  14/43/1540 08:67 AM 0.89 0.57 - 1.00 mg/dL Final  61/95/0932 67:12 AM 0.85 0.57 - 1.00 mg/dL Final  45/80/9983 38:25 PM 0.76 0.57 - 1.00 mg/dL Final    CBC: Recent Labs  Lab 01/03/24 1633 01/03/24 2036 01/04/24 0413  WBC 23.1* 22.7* 21.3*  NEUTROABS 20.6*  --   --   HGB 12.0 12.4 10.9*  HCT RESULTS UNAVAILABLE DUE TO INTERFERING SUBSTANCE RESULTS UNAVAILABLE  DUE TO INTERFERING SUBSTANCE 29.2*  MCV RESULTS UNAVAILABLE DUE TO INTERFERING SUBSTANCE  RESULTS UNAVAILABLE DUE TO INTERFERING SUBSTANCE 79.6*  PLT 323 291 299    Microbiology: Results for orders placed or performed during the hospital encounter of 01/03/24  Resp panel by RT-PCR (RSV, Flu A&B, Covid) Anterior Nasal Swab     Status: Abnormal   Collection Time: 01/03/24  2:52 PM   Specimen: Anterior Nasal Swab  Result Value Ref Range Status   SARS Coronavirus 2 by RT PCR NEGATIVE NEGATIVE Final    Comment: (NOTE) SARS-CoV-2 target nucleic acids are NOT DETECTED.  The SARS-CoV-2 RNA is generally detectable in upper respiratory specimens during the acute phase of infection. The lowest concentration of SARS-CoV-2 viral copies this assay can detect is 138 copies/mL. A negative result does not preclude SARS-Cov-2 infection and should not be used as the sole basis for treatment or other patient management decisions. A negative result may occur with  improper specimen collection/handling, submission of specimen other than nasopharyngeal swab, presence of viral mutation(s) within the areas targeted by this assay, and inadequate number of viral copies(<138 copies/mL). A negative result must be combined with clinical observations, patient history, and epidemiological information. The expected result is Negative.  Fact Sheet for Patients:  BloggerCourse.com  Fact Sheet for Healthcare Providers:  SeriousBroker.it  This test is no t yet approved or cleared by the [place] FDA and  has been authorized for detection and/or diagnosis of SARS-CoV-2 by FDA under an Emergency Use Authorization (EUA). This EUA will remain  in effect (meaning this test can be used) for the duration of the COVID-19 declaration under Section 564(b)(1) of the Act, 21 U.S.C.section 360bbb-3(b)(1), unless the authorization is terminated  or revoked sooner.        Influenza A by PCR POSITIVE (A) NEGATIVE Final   Influenza B by PCR NEGATIVE NEGATIVE Final    Comment: (NOTE) The Xpert Xpress SARS-CoV-2/FLU/RSV plus assay is intended as an aid in the diagnosis of influenza from Nasopharyngeal swab specimens and should not be used as a sole basis for treatment. Nasal washings and aspirates are unacceptable for Xpert Xpress SARS-CoV-2/FLU/RSV testing.  Fact Sheet for Patients: BloggerCourse.com  Fact Sheet for Healthcare Providers: SeriousBroker.it  This test is not yet approved or cleared by the [place] FDA and has been authorized for detection and/or diagnosis of SARS-CoV-2 by FDA under an Emergency Use Authorization (EUA). This EUA will remain in effect (meaning this test can be used) for the duration of the COVID-19 declaration under Section 564(b)(1) of the Act, 21 U.S.C. section 360bbb-3(b)(1), unless the authorization is terminated or revoked.     Resp Syncytial Virus by PCR NEGATIVE NEGATIVE Final    Comment: (NOTE) Fact Sheet for Patients: BloggerCourse.com  Fact Sheet for Healthcare Providers: SeriousBroker.it  This test is not yet approved or cleared by the [place] FDA and has been authorized for detection and/or diagnosis of SARS-CoV-2 by FDA under an Emergency Use Authorization (EUA). This EUA will remain in effect (meaning this test can be used) for the duration of the COVID-19 declaration under Section 564(b)(1) of the Act, 21 U.S.C. section 360bbb-3(b)(1), unless the authorization is terminated or revoked.  Performed at [facility], [address] [place], [place] [address]   MRSA Next Gen by PCR, Nasal     Status: None   Collection Time: 01/03/24  8:45 PM   Specimen: Nasal Mucosa; Nasal Swab  Result Value Ref Range Status   MRSA by PCR Next Gen NOT DETECTED NOT DETECTED Final    Comment:  (NOTE)  The GeneXpert MRSA Assay (FDA approved for NASAL specimens only), is one component of a comprehensive MRSA colonization surveillance program. It is not intended to diagnose MRSA infection nor to guide or monitor treatment for MRSA infections. Test performance is not FDA approved in patients less than 51 years old. Performed at [facility], [address] [place], [place] [address]     Urinalysis: Recent Labs    01/03/24 2204  COLORURINE STRAW*  LABSPEC 1.005  PHURINE 6.0  GLUCOSEU >=500*  HGBUR NEGATIVE  BILIRUBINUR NEGATIVE  KETONESUR NEGATIVE  PROTEINUR NEGATIVE  NITRITE NEGATIVE  LEUKOCYTESUR NEGATIVE     Imaging:  DG Chest Port 1 View Addendum Date: 01/04/2024 ADDENDUM REPORT: 01/04/2024 00:34 ADDENDUM: Addendum to include position of the central line. Right IJ CVC tip in the low SVC.  No pneumothorax. Electronically Signed   By: Minerva Fester M.D.   On: 01/04/2024 00:34   Result Date: 01/04/2024 CLINICAL DATA:  Central line placement EXAM: PORTABLE CHEST 1 VIEW COMPARISON:  Radiograph 01/03/2024 at 6:40 p.m. FINDINGS: Stable cardiomegaly. Patchy bilateral airspace and interstitial opacities are similar. Subdiaphragmatic enteric tube. The endotracheal tube tip is approximately 2.3 cm from the carina. IMPRESSION: 1. Endotracheal tube tip is approximately 2.3 cm from the carina. 2. Similar cardiomegaly and bilateral airspace opacities. Electronically Signed: By: Minerva Fester M.D. On: 01/04/2024 00:16   DG Abd 1 View Result Date: 01/04/2024 CLINICAL DATA:  Abdominal distension EXAM: ABDOMEN - 1 VIEW COMPARISON:  Radiograph 01/03/2024 at 6:43 p.m. FINDINGS: Enteric tube tip and side-port in the stomach. IMPRESSION: Enteric tube tip and side-port in the stomach. Electronically Signed   By: Minerva Fester M.D.   On: 01/04/2024 00:15   CT HEAD WO CONTRAST ( ) Result Date: 01/03/2024 CLINICAL DATA:  Altered mental status EXAM: CT HEAD WITHOUT CONTRAST  TECHNIQUE: Contiguous axial images were obtained from the base of the skull through the vertex without intravenous contrast. RADIATION DOSE REDUCTION: This exam was performed according to the departmental dose-optimization program which includes automated exposure control, adjustment of the mA and/or kV according to patient size and/or use of iterative reconstruction technique. COMPARISON:  None Available. FINDINGS: Brain: No mass,hemorrhage or extra-axial collection. Normal appearance of the parenchyma and CSF spaces. Vascular: No hyperdense vessel or unexpected vascular calcification. Skull: The visualized skull base, calvarium and extracranial soft tissues are normal. Sinuses/Orbits: Left mastoid effusion. Normal orbits. Small right maxillary sinus retention cyst. Other: None. IMPRESSION: 1. No acute intracranial abnormality. 2. Left mastoid effusion, possibly due to intubation. Electronically Signed   By: Deatra Robinson M.D.   On: 01/03/2024 20:24   DG Abdomen 1 View Result Date: 01/03/2024 CLINICAL DATA:  Orogastric tube placement. EXAM: ABDOMEN - 1 VIEW COMPARISON:  None Available. FINDINGS: Distal tip of nasogastric tube is seen in proximal stomach. IMPRESSION: Distal tip of nasogastric tube seen in proximal stomach. Electronically Signed   By: Lupita Raider M.D.   On: 01/03/2024 18:59   DG Chest Portable 1 View Result Date: 01/03/2024 CLINICAL DATA:  Endotracheal tube placement. EXAM: PORTABLE CHEST 1 VIEW COMPARISON:  Same day. FINDINGS: Endotracheal tube tip is 2 cm above the carina in grossly good position. Nasogastric tube is seen entering stomach. Stable cardiomegaly and bilateral lung opacities. IMPRESSION: Endotracheal tube in grossly good position. Electronically Signed   By: Lupita Raider M.D.   On: 01/03/2024 18:59   DG Chest Portable 1 View Result Date: 01/03/2024 CLINICAL DATA:  Shortness of breath EXAM: PORTABLE CHEST 1 VIEW  COMPARISON:  None Available. FINDINGS: The heart size and  mediastinal contours are within normal limits. Extensive patchy airspace opacities throughout both lungs. No pleural effusion or pneumothorax. The visualized skeletal structures are unremarkable. IMPRESSION: Extensive patchy airspace opacities throughout both lungs, which could represent multifocal pneumonia or edema. Electronically Signed   By: Duanne Guess D.O.   On: 01/03/2024 15:21     Assessment & Plan:   63 y.o. female with a PMHx of type 1 diabetes on insulin, hypertension, congestive heart failure, obesity, depression and hyperlipidemia now presented to the emergency room with history of productive cough, shortness of breath associated with decreased p.o. intake and nausea vomiting.  She is found to have influenza A infection and was in respiratory distress.  She was subsequently intubated and sedated.  She has been on hydrochlorothiazide and also SSRIs at home.  She is found to have hypokalemic metabolic alkalosis with a  sodium of less than 102 along with potassium of 3.2.  She was started on hypertonic saline at 40 cc an hour.    Principal Problem:   Hyponatremia  #1: Hyponatremia: Hyponatremia is multifactorial.  The hyponatremia can be secondary to hydrochlorothiazide complicated by acute pulmonary illness and intake of SSRIs.  Will continue the hypertonic saline at 40 cc an hour until the sodium improves to over 125.   #2: Hypokalemia: Hypokalemia secondary to hydrochlorothiazide.  Supplement as ordered.  #3: Vent dependent respiratory failure: Secondary to influenza.  Continue the protocol as per ICU.  #4: Hypotension: Hypotension secondary to severe systemic inflammatory response syndrome due to pulmonary illness.  Will continue pressors as ordered and wean as tolerated.  #5: Sepsis: Septic shock secondary to overwhelming viral illness complicated by bacterial infection.  Case discussed with ICU team. Spoke to patient's son at bedside and explained the severity of the  illness.  He expressed understanding.  Will follow closely.    LOS: 1 Lorain Childes, MD [facility]. 2/3/20257:22 AM

## 2024-01-04 NOTE — Inpatient Diabetes Management (Signed)
Inpatient Diabetes Program Recommendations  AACE/ADA: New Consensus Statement on Inpatient Glycemic Control (2015)  Target Ranges:  Prepandial:   less than 140 mg/dL      Peak postprandial:   less than 180 mg/dL (1-2 hours)      Critically ill patients:  140 - 180 mg/dL   Lab Results  Component Value Date   GLUCAP 108 (H) 01/04/2024   HGBA1C 6.9 (H) 01/04/2024    Latest Reference Range & Units Most Recent  Sodium 135 - 145 mmol/L 109 (LL) 01/04/24 05:32  Potassium 3.5 - 5.1 mmol/L 3.8 01/04/24 04:13  Chloride 98 - 111 mmol/L 74 (L) 01/04/24 04:13  CO2 22 - 32 mmol/L 27 01/04/24 04:13  Glucose 70 - 99 mg/dL 841 (H) 01/31/43 01:02  Mean Plasma Glucose mg/dL 725.36 05/05/39 34:74  BUN 8 - 23 mg/dL 11 01/05/94 63:87  Creatinine 0.44 - 1.00 mg/dL 5.64 02/01/28 51:88  Calcium 8.9 - 10.3 mg/dL 7.8 (L) 03/01/65 06:30  Anion gap 5 - 15  7 01/04/24 04:13  (LL): Data is critically low (L): Data is abnormally low (H): Data is abnormally high  Diabetes history: DM1 Outpatient Diabetes medications: Lantus 80 units daily, Humalog total 65 units divided between 3 meals Current orders for Inpatient glycemic control: IV insulin Received Solumedrol 125 mg x 1 prior to arrival to ED  Inpatient Diabetes Program Recommendations:   Noted patient admitted with dx respiratory failure with Influenza A.  Please consider when ready to transition to [place] insulin: -Add Semglee 20 units bid (give 2 hrs prior to D/C of IV insulin) -Novolog 0-6 units q 4 hrs. (Cover CBG when IV insulin discontinued)  Will follow while hospitalized.  Thank you, Jill Crosby. Jill Savard, RN, MSN, CDCES  Diabetes Coordinator Inpatient Glycemic Control Team Team Pager [phone] (8am-5pm) 01/04/2024 10:10 AM

## 2024-01-04 NOTE — Progress Notes (Signed)
Initial Nutrition Assessment  DOCUMENTATION CODES:   Morbid obesity  INTERVENTION:   If patient does not extubate:  Vital 1.2@60ml /hr- Initiate at 49ml/hr and increase by 64ml/hr q 8 hours until goal rate is reached.   ProSource TF 20- Give 60ml daily via tube, each supplement provides 80kcal and 20g of protein.   Free water flushes 30ml q4 hours to maintain tube patency   Regimen provides 1808kcal/day, 128g/day protein and 138ml/day of free water.   Pt at high refeed risk; recommend monitor potassium, magnesium and phosphorus labs daily until stable  Daily weights   NUTRITION DIAGNOSIS:   Inadequate oral intake related to inability to eat (pt sedated and ventilated) as evidenced by NPO status.  GOAL:   Provide needs based on ASPEN/SCCM guidelines  MONITOR:   Vent status, Labs, Weight trends, TF tolerance, I & O's, Skin  REASON FOR ASSESSMENT:   Ventilator    ASSESSMENT:   63 y/o female with h/o anxiety, depression, CHF, HTN, type I DM (diangosed age 63y) and B12 deficiency who is admitted with Flu A and sepsis.  Pt sedated and ventilated. Plan is for possible extubation today. OGT in place. Pt noted to have abdominal distension yesterday with output overnight. KUB from today within normal limits. OGT output decreased. RD suspects pt with shock ileus. Will plan to initiate tube feeds tomorrow if patient does not extubate. Per chart, pt noted to be up ~18lbs from her UBW. Pt appears weight stable at baseline. No BM yet.   Medications reviewed and include: pepcid, heparin, insulin, azithromycin, ceftriaxone, levophed  Labs reviewed: Na 112(L), K 3.8 wnl, P 3.3 wnl, Mg 2.1 wnl Wbc- 21.3(H), Hgb 10.9(L), Hct 29.2(L), MCV 79.6(L) Cbgs- 198, 271, 274, 168, 170, 100, 108 x 24 hrs  AIC 6.9(H)- 2/3  Patient is currently intubated on ventilator support MV: 11 L/min Temp (24hrs), Avg:98.1 F (36.7 C), Min:95.5 F (35.3 C), Max:99 F (37.2 C)  MAP- >58mmHg    UOP-   NUTRITION - FOCUSED PHYSICAL EXAM:  Flowsheet Row Most Recent Value  Orbital Region No depletion  Upper Arm Region No depletion  Thoracic and Lumbar Region No depletion  Buccal Region No depletion  Temple Region No depletion  Clavicle Bone Region No depletion  Clavicle and Acromion Bone Region No depletion  Scapular Bone Region No depletion  Dorsal Hand No depletion  Patellar Region No depletion  Anterior Thigh Region No depletion  Posterior Calf Region No depletion  Edema (RD Assessment) Moderate  Hair Reviewed  Eyes Reviewed  Mouth Reviewed  Skin Reviewed  Nails Reviewed   Diet Order:   Diet Order             Diet NPO time specified  Diet effective now                  EDUCATION NEEDS:   No education needs have been identified at this time  Skin:  Skin Assessment: Reviewed RN Assessment  Last BM:  pta  Height:   Ht Readings from Last 1 Encounters:  01/03/24 5\' 4"  (1.626 m)    Weight:   Wt Readings from Last 1 Encounters:  01/04/24 120.1 kg    Ideal Body Weight:  54.5 kg  BMI:  Body mass index is 45.45 kg/m.  Estimated Nutritional Needs:   Kcal:  1320-1680kcal/day  Protein:  120-135g/day  Fluid:  1.4-1.6L/day  Betsey Holiday MS, RD, LDN If unable to be reached, please send secure chat to "RD inpatient" available  from 8:00a-4:00p daily

## 2024-01-04 NOTE — Progress Notes (Addendum)
PHARMACY CONSULT NOTE - Hypertonic Saline  Pharmacy Consult for Hypertonic Saline Monitoring and Management  Recent Labs: Potassium (mmol/L)  Date Value  01/03/2024 3.2 (L)   Magnesium (mg/dL)  Date Value  16/09/9603 2.2   Calcium (mg/dL)  Date Value  54/08/8118 8.0 (L)   Albumin (g/dL)  Date Value  14/78/2956 3.4 (L)  03/24/2023 4.2   Sodium (mmol/L)  Date Value  01/04/2024 108 (LL)  04/29/2023 136    Assessment  Jill Crosby is a 63 y.o. female presenting with SOB. PMH significant for hypertension, hyperlipidemia, and diabetes. Pharmacy has been consulted to monitor hypertonic saline (3%) infusion.  Pertinent medications: None  Baseline Labs: Serum Na <102  Goal of Therapy:  Increase in Na by 4-6 mEq/L in 4-6 hours Do not exceed increase in Na by 10-12 mEq/L in 24 hours  Monitoring:  Date Time Na Rate/Comment  2/2       1633   102      Baseline  2/2       1735   102      Repeat previous level - 0.9 % NaCl 1000 mL X 1 given @ 1804 - 3% NaCl 100 mL IV X 1 given over 10 min @ 1853  2/2       2036   103      HTS not running, CCU NP notified - 3% NaCl started at 40 ml/hr @ 2/3 0002  2/3       0106   107      HTS only running for 1 hr  - 3% 100 mL X 1 bolus given @ 0221, Na corrected for CBG of 362  2/3       0341   109      2 mEq increase in ~ 2.5 hr  - Na corrected for hyperglycemia, CBG of 190  2/3      0532    110      3 mEq increase in ~ 4.5 hrs - Na corrected for CBG of 182    Plan:  Give 3% saline bolus 500 mL x 1 as ordered by MD Check Na Q2H x2 then Q4H  Pt is also hyperglycemic so will need to correct Na level for glucose Stop infusion if  Na increases > 4 mEq/L in first 2 hours Na increases > 6 mEq/L in first 4 hours Na increases > 6 mEq/L in 6 hours  Na increases > 8 mEq/L in 8 hours (give D5W bolus) Continue to monitor for signs of clinical improvement   Jill Crosby D PharmD Clinical Pharmacist 01/03/24 5:01 AM

## 2024-01-04 NOTE — Plan of Care (Signed)

## 2024-01-05 ENCOUNTER — Inpatient Hospital Stay (HOSPITAL_COMMUNITY)
Admit: 2024-01-05 | Discharge: 2024-01-05 | Disposition: A | Discharge status: Home / Self Care | Payer: Managed Care, Other (non HMO) | Attending: Student in an Organized Health Care Education/Training Program

## 2024-01-05 DIAGNOSIS — J101 Influenza due to other identified influenza virus with other respiratory manifestations: Secondary | ICD-10-CM | POA: Diagnosis not present

## 2024-01-05 DIAGNOSIS — R0609 Other forms of dyspnea: Secondary | ICD-10-CM

## 2024-01-05 DIAGNOSIS — E871 Hypo-osmolality and hyponatremia: Secondary | ICD-10-CM | POA: Diagnosis not present

## 2024-01-05 DIAGNOSIS — J9601 Acute respiratory failure with hypoxia: Secondary | ICD-10-CM | POA: Diagnosis not present

## 2024-01-05 LAB — SODIUM, URINE, RANDOM: Sodium, Ur: 47 mmol/L

## 2024-01-05 LAB — CBC
HCT: 28.2 % — ABNORMAL LOW (ref 36.0–46.0)
Hemoglobin: 10.3 g/dL — ABNORMAL LOW (ref 12.0–15.0)
MCH: 29.9 pg (ref 26.0–34.0)
MCHC: 36.5 g/dL — ABNORMAL HIGH (ref 30.0–36.0)
MCV: 82 fL (ref 80.0–100.0)
Platelets: 340 10*3/uL (ref 150–400)
RBC: 3.44 MIL/uL — ABNORMAL LOW (ref 3.87–5.11)
RDW: 12.5 % (ref 11.5–15.5)
WBC: 30 10*3/uL — ABNORMAL HIGH (ref 4.0–10.5)
nRBC: 0 % (ref 0.0–0.2)

## 2024-01-05 LAB — GLUCOSE, CAPILLARY
Glucose-Capillary: 107 mg/dL — ABNORMAL HIGH (ref 70–99)
Glucose-Capillary: 159 mg/dL — ABNORMAL HIGH (ref 70–99)
Glucose-Capillary: 174 mg/dL — ABNORMAL HIGH (ref 70–99)
Glucose-Capillary: 250 mg/dL — ABNORMAL HIGH (ref 70–99)
Glucose-Capillary: 283 mg/dL — ABNORMAL HIGH (ref 70–99)
Glucose-Capillary: 74 mg/dL (ref 70–99)
Glucose-Capillary: 76 mg/dL (ref 70–99)

## 2024-01-05 LAB — RESPIRATORY PANEL BY PCR

## 2024-01-05 LAB — ACTH: C206 ACTH: 2.4 pg/mL — ABNORMAL LOW (ref 7.2–63.3)

## 2024-01-05 LAB — COMPREHENSIVE METABOLIC PANEL
ALT: 40 U/L (ref 0–44)
AST: 35 U/L (ref 15–41)
Albumin: 2.5 g/dL — ABNORMAL LOW (ref 3.5–5.0)
Alkaline Phosphatase: 45 U/L (ref 38–126)
Anion gap: 7 (ref 5–15)
BUN: 14 mg/dL (ref 8–23)
CO2: 26 mmol/L (ref 22–32)
Calcium: 8.1 mg/dL — ABNORMAL LOW (ref 8.9–10.3)
Chloride: 77 mmol/L — ABNORMAL LOW (ref 98–111)
Creatinine, Ser: 0.77 mg/dL (ref 0.44–1.00)
GFR, Estimated: >60 mL/min (ref >60)
Glucose, Bld: 260 mg/dL — ABNORMAL HIGH (ref 70–99)
Potassium: 4.7 mmol/L (ref 3.5–5.1)
Sodium: 110 mmol/L — CL (ref 135–145)
Total Bilirubin: 0.7 mg/dL (ref 0.0–1.2)
Total Protein: 5.7 g/dL — ABNORMAL LOW (ref 6.5–8.1)

## 2024-01-05 LAB — ECHOCARDIOGRAM COMPLETE
AR max vel: 1.66 cm2
AV Area VTI: 1.66 cm2
AV Area mean vel: 1.67 cm2
AV Mean grad: 8 mm[Hg]
AV Peak grad: 15.5 mm[Hg]
Ao pk vel: 1.97 m/s
Area-P 1/2: 3.02 cm2
Calc EF: 59.9 %
Height: 64 in
MV VTI: 1.68 cm2
S' Lateral: 3 cm
Single Plane A2C EF: 63.5 %
Single Plane A4C EF: 55.2 %
Weight: 4225.78 [oz_av]

## 2024-01-05 LAB — SODIUM
Sodium: 110 mmol/L — CL (ref 135–145)
Sodium: 112 mmol/L — CL (ref 135–145)
Sodium: 115 mmol/L — CL (ref 135–145)
Sodium: 115 mmol/L — CL (ref 135–145)
Sodium: 116 mmol/L — CL (ref 135–145)
Sodium: 118 mmol/L — CL (ref 135–145)
Sodium: 117 mmol/L — CL (ref 135–145)

## 2024-01-05 LAB — MAGNESIUM: Magnesium: 2.3 mg/dL (ref 1.7–2.4)

## 2024-01-05 LAB — PHOSPHORUS: Phosphorus: 2.2 mg/dL — ABNORMAL LOW (ref 2.5–4.6)

## 2024-01-05 LAB — OSMOLALITY: Osmolality: 244 mosm/kg — CL (ref 275–295)

## 2024-01-05 LAB — OSMOLALITY, URINE: Osmolality, Ur: 368 mosm/kg (ref 300–900)

## 2024-01-05 MED ORDER — FAMOTIDINE IN NACL 20-0.9 MG/50ML-% IV SOLN
20.0000 mg | Freq: Two times a day (BID) | INTRAVENOUS | Status: DC
  Administered 2024-01-05 – 2024-01-06 (×2): 20 mg via INTRAVENOUS
  Filled 2024-01-05 (×2): qty 50

## 2024-01-05 MED ORDER — ENOXAPARIN SODIUM 60 MG/0.6ML IJ SOSY
60.0000 mg | PREFILLED_SYRINGE | INTRAMUSCULAR | Status: DC
  Administered 2024-01-05 – 2024-01-11 (×7): 60 mg via SUBCUTANEOUS
  Filled 2024-01-05 (×7): qty 0.6

## 2024-01-05 MED ORDER — SODIUM CHLORIDE 3 % IV SOLN
INTRAVENOUS | Status: DC
  Administered 2024-01-06: 25 mL/h via INTRAVENOUS
  Filled 2024-01-05 (×2): qty 500

## 2024-01-05 MED ORDER — HYDRALAZINE HCL 20 MG/ML IJ SOLN
10.0000 mg | Freq: Four times a day (QID) | INTRAMUSCULAR | Status: DC | PRN
  Administered 2024-01-05: 10 mg via INTRAVENOUS
  Filled 2024-01-05: qty 1

## 2024-01-05 MED ORDER — FUROSEMIDE 10 MG/ML IJ SOLN
20.0000 mg | Freq: Once | INTRAMUSCULAR | Status: AC
  Administered 2024-01-05: 20 mg via INTRAVENOUS
  Filled 2024-01-05: qty 2

## 2024-01-05 MED ORDER — POTASSIUM & SODIUM PHOSPHATES 280-160-250 MG PO PACK
2.0000 | PACK | Freq: Once | ORAL | Status: AC
  Administered 2024-01-05: 2
  Filled 2024-01-05: qty 2

## 2024-01-05 MED ORDER — NICARDIPINE HCL IN NACL 20-0.86 MG/200ML-% IV SOLN
3.0000 mg/h | INTRAVENOUS | Status: DC
  Administered 2024-01-05: 5 mg/h via INTRAVENOUS
  Filled 2024-01-05 (×2): qty 200

## 2024-01-05 MED ORDER — ORAL CARE MOUTH RINSE: 15.0000 mL | OROMUCOSAL | Status: DC | PRN

## 2024-01-05 MED ORDER — NICOTINE 14 MG/24HR TD PT24
14.0000 mg | MEDICATED_PATCH | Freq: Every day | TRANSDERMAL | Status: DC
  Administered 2024-01-05 – 2024-01-12 (×8): 14 mg via TRANSDERMAL
  Filled 2024-01-05 (×8): qty 1

## 2024-01-05 MED ORDER — SODIUM CHLORIDE 3 % IV SOLN
INTRAVENOUS | Status: DC
Start: 2024-01-05 — End: 2024-01-05
  Filled 2024-01-05: qty 500

## 2024-01-05 MED ORDER — ORAL CARE MOUTH RINSE
15.0000 mL | OROMUCOSAL | Status: DC
  Administered 2024-01-05 – 2024-01-12 (×24): 15 mL via OROMUCOSAL

## 2024-01-05 MED ORDER — INSULIN GLARGINE-YFGN 100 UNIT/ML ~~LOC~~ SOLN
25.0000 [IU] | Freq: Two times a day (BID) | SUBCUTANEOUS | Status: DC
  Administered 2024-01-05: 25 [IU] via SUBCUTANEOUS
  Filled 2024-01-05 (×3): qty 0.25

## 2024-01-05 NOTE — Progress Notes (Signed)
*  PRELIMINARY RESULTS* Echocardiogram 2D Echocardiogram has been performed.  Carolyne Fiscal 01/05/2024, 9:28 AM

## 2024-01-05 NOTE — Progress Notes (Signed)
Notified [facility] sodium level 115. Awaiting for orders.

## 2024-01-05 NOTE — Consult Note (Signed)
 PHARMACY CONSULT NOTE - Hypertonic Saline  Pharmacy Consult for Hypertonic Saline Monitoring and Management  Recent Labs: Potassium (mmol/L)  Date Value  01/05/2024 4.7   Magnesium  (mg/dL)  Date Value  97/95/7974 2.3   Calcium  (mg/dL)  Date Value  97/95/7974 8.1 (L)   Albumin (g/dL)  Date Value  97/95/7974 2.5 (L)  03/24/2023 4.2   Phosphorus (mg/dL)  Date Value  97/95/7974 2.2 (L)   Sodium (mmol/L)  Date Value  01/05/2024 115 (LL)  04/29/2023 136   Assessment  Jill Crosby is a 63 y.o. female presenting with cough, shortness of breath and decrease po intake in addition to N/V. They were intubated due to respiratory distress secondary to influenza. They were found to be hyponatremic possibly due to offending PTA medications. However, none have been re-started while inpatient. Pharmacy has been consulted to monitor hypertonic saline (3%) infusion.  Pertinent medications:  Hydrochlorothiazide  (not started inpatient) Sertraline  (not started inpatient)  Baseline Labs: Serum Na 115, Serum Osm ordered, Urine Na ordered, Urine Osm ordered  Goal of Therapy:  Increase in Na by 4-6 mEq/L in 4-6 hours Do not exceed increase in Na by 10-12 mEq/L in 24 hours  Monitoring:  Date Time Na Rate/Comment    Plan:  Initiate 3% saline at 25 mL/hr Check Na Q2H x2 then Q4H  Stop infusion if  Na increases > 4 mEq/L in first 2 hours Na increases > 6 mEq/L in first 4 hours Na increases > 6 mEq/L in 6 hours  Na increases > 8 mEq/L in 8 hours (give D5W bolus) Continue to monitor for signs of clinical improvement and recommendations per nephrology  Jill Crosby, PharmD Pharmacy Resident  01/05/2024 4:54 PM

## 2024-01-05 NOTE — Progress Notes (Signed)
Patient extubated to bi-pap per Dr. Aundria Rud. MD made aware of sodium level 112. BP 180's PRN ordered and lasix. Continue to assess.

## 2024-01-05 NOTE — Progress Notes (Signed)
 Nutrition Follow Up Note   DOCUMENTATION CODES:   Morbid obesity  INTERVENTION:   RD will add supplements with diet advancement   MVI po daily with diet advancement   Pt remains at high refeed risk; recommend monitor potassium, magnesium  and phosphorus labs daily until stable  Daily weights   NUTRITION DIAGNOSIS:   Inadequate oral intake related to inability to eat (pt sedated and ventilated) as evidenced by NPO status.  GOAL:   Patient will meet greater than or equal to 90% of their needs -not met   MONITOR:   Diet advancement, Labs, Weight trends, I & O's, Skin  ASSESSMENT:   63 y/o female with h/o anxiety, depression, CHF, HTN, type I DM (diangosed age 29y) and B12 deficiency who is admitted with Flu A and sepsis.  Pt extubated to bipap today. RD will add supplements with diet advancement. Pt is actively refeeding; electrolytes being monitored and supplemented by pharmacy. Pt with ongoing hyponatremia. Per chart, pt is up ~17lbs from her UBW.   Medications reviewed and include: lovenox , pepcid , insulin , nicotine , azithromycin , ceftriaxone , hypertonic saline  Labs reviewed: Na 112(L), K 4.7 wnl, P 2.2(L), Mg 2.3 wnl Wbc- 30.0(H), Hgb 10.3(L), Hct 28.2(L) Cbgs- 174, 250, 283 x 24 hrs   Diet Order:   Diet Order             Diet NPO time specified  Diet effective now                  EDUCATION NEEDS:   No education needs have been identified at this time  Skin:  Skin Assessment: Reviewed RN Assessment  Last BM:  2/4- type 6  Height:   Ht Readings from Last 1 Encounters:  01/03/24 5' 4 (1.626 m)    Weight:   Wt Readings from Last 1 Encounters:  01/05/24 119.8 kg    Ideal Body Weight:  54.5 kg  BMI:  Body mass index is 45.33 kg/m.  Estimated Nutritional Needs:   Kcal:  2200-2500kcal/day  Protein:  110-125g/day  Fluid:  1.4-1.6L/day  Augustin Shams MS, RD, LDN If unable to be reached, please send secure chat to RD inpatient  available from 8:00a-4:00p daily

## 2024-01-05 NOTE — Progress Notes (Signed)
 NAME:  Jill Crosby, MRN:  [id number], DOB:  1961/06/18, LOS: 2 ADMISSION DATE:  01/03/2024, CHIEF COMPLAINT:  Hyponatremia, Encephalopathy   History of Present Illness:   This is 63 year old female with a history of type 1 diabetes since age 69, previously on insulin  pump which was switched to subcutaneous injections years ago.  She had an URTI recently with respiratory symptoms and worsening encephalopathy prompting presentation to the hospital.  History was obtained from family members who provided collateral information given intubation and mechanical ventilation. Additional background medical history includes essential hypertension, dyslipidemia, anxiety disorder, history of smoking, arthritis, morbid obesity and glaucoma. She presents with N/V and altered mental status after coming down with a cold over a week ago. She tested positive for influenza A in the ED. BMP showed hyperglycemia at 300 and severe hyponatremia and undetectably low levels less than 102 with hypochloremia.  She was encephalopathic and agitated and was intubated in the ED. PCCM was consulted for admission for management of encephalopathy, respiratory failure, and hyponatremia.  Pertinent  Medical History  -T1DM -HTN -HLD -Obesity  Significant Hospital Events: Including procedures, antibiotic start and stop dates in addition to other pertinent events   01/03/2024: admit to hospital, intubated, severe hyponatremia 01/04/2024: remains intubated, follows commands. Sodium 109-111 01/05/2024: passed SBT, followed commands, sodium improved, extubated to BiPAP  Interim History / Subjective:  Awake and alert, follows commands.  Objective   Blood pressure (!) 186/61, pulse 69, temperature 97.7 F (36.5 C), resp. rate 15, height 5' 4 (1.626 m), weight 119.8 kg, SpO2 96%.    Vent Mode: PSV FiO2 (%):  [35 %-50 %] 35 % Set Rate:  [18 bmp] 18 bmp Vt Set:  [500 mL] 500 mL PEEP:  [5 cmH20-8 cmH20] 8 cmH20 Pressure Support:  [5  cmH20-20 cmH20] 12 cmH20 Plateau Pressure:  [12 cmH20-22 cmH20] 22 cmH20   Intake/Output Summary (Last 24 hours) at 01/05/2024 1330 Last data filed at 01/05/2024 1037 Gross per 24 hour  Intake 1068.63 ml  Output 830 ml  Net 238.63 ml   Filed Weights   01/03/24 2021 01/04/24 0500 01/05/24 0330  Weight: 114.1 kg 120.1 kg 119.8 kg    Examination: Physical Exam Constitutional:      General: She is not in acute distress.    Appearance: She is obese. She is ill-appearing.  Cardiovascular:     Rate and Rhythm: Normal rate and regular rhythm.     Heart sounds: Normal heart sounds.  Pulmonary:     Breath sounds: No wheezing or rales.     Comments: Ventilated breath sounds bilaterally Neurological:     Mental Status: She is disoriented.      Assessment & Plan:   #Acute Hypoxic Respiratory Failure #Severe Symptomatic Hyponatremia #Toxic Metabolic Encephalopathy #Severe Hyperglycemia without DKA #Influenza A infection #Acute on Chronic HFpEF  63 year old female with a history of T1DM presents with a few days history of URTI found to have influenza pneumonia and toxic metabolic encephalopathy secondary to severe hyponatremia. This is likely to osmotic diuresis, hydrochlorothiazide  use, SSRI use, and decreased PO intake. She was intubated for respiratory failure and encephalopathy.  Neuro - on Fentanyl  and Dexmedetomidine  for analgosedation, held today for extubation with improvement, now discontinued. Mental status intact. Holding SSRI given hyponatremia. Sodium improving slowly, but remains at high risk for seizure. CV - required vasopressor support secondary to sedation related hypotension, now discontinued. Lactic acid within normal and is overall well perfused. Elevated BNP with signs of  volume overload suggests heart failure, TTE with signs of HFpEF. Will administer 20 mg of IV furosemide  today.  Pulm - Respiratory failure following influenza infection with chest xray showing  increased interstitial markings. This could represent findings secondary to influenza vs findings of decompensated heart failure given elevated BNP. Extubated to BiPAP after passing SBT. GI - NPO for now, on famotidine  for prophylaxis Renal - severe hyponatremia, initially < 102. Goal of 6 to 8 in 24 hours. Mildly overcorrected yesterday and required D5W and ddavp . This AM sodium at goal of 110, will re-initiate hypertonic saline at a rate of 20 ml/hour and re-check sodium in 4 hours. Appreciate input from nephrology, workup is pending. Endo - switch from insulin  gtt to basal bolus given severe hyponatremia and to decrease obligate fluid intake. Hem/Onc - heparin  subQ for DVT prophylaxis ID - influenza infection, re-sent swab per ID's recommendations. Will continue antibiotics for CAP coverage. Continue Tamiflu .  Best Practice (right click and Reselect all SmartList Selections daily)   Diet/type: NPO DVT prophylaxis prophylactic heparin   Pressure ulcer(s): N/A GI prophylaxis: H2B Lines: Central line, Arterial Line, and yes and it is still needed Foley:  Yes, and it is still needed Code Status:  full code Last date of multidisciplinary goals of care discussion [01/05/2024]  Labs   CBC: Recent Labs  Lab 01/03/24 1633 01/03/24 2036 01/04/24 0413 01/05/24 0505  WBC 23.1* 22.7* 21.3* 30.0*  NEUTROABS 20.6*  --   --   --   HGB 12.0 12.4 10.9* 10.3*  HCT RESULTS UNAVAILABLE DUE TO INTERFERING SUBSTANCE RESULTS UNAVAILABLE DUE TO INTERFERING SUBSTANCE 29.2* 28.2*  MCV RESULTS UNAVAILABLE DUE TO INTERFERING SUBSTANCE RESULTS UNAVAILABLE DUE TO INTERFERING SUBSTANCE 79.6* 82.0  PLT 323 291 299 340    Basic Metabolic Panel: Recent Labs  Lab 01/03/24 1633 01/03/24 1735 01/03/24 1840 01/03/24 2036 01/04/24 0106 01/04/24 0413 01/04/24 0532 01/04/24 1611 01/04/24 2221 01/05/24 0125 01/05/24 0505 01/05/24 0935  NA <102*   < >  --  103*   < > 108*   < > 112* 109* 110* 110* 112*  K  3.2*  --   --   --   --  3.8  --   --   --   --  4.7  --   CL <65*  --   --   --   --  74*  --   --   --   --  77*  --   CO2 24  --   --   --   --  27  --   --   --   --  26  --   GLUCOSE 324*  --   --   --   --  190*  --   --   --   --  260*  --   BUN 10  --   --   --   --  11  --   --   --   --  14  --   CREATININE 0.63  --  0.64  --   --  0.80  --   --   --   --  0.77  --   CALCIUM  8.0*  --   --   --   --  7.8*  --   --   --   --  8.1*  --   MG  --   --   --  2.2  --  2.1  --   --   --   --  2.3  --   PHOS  --   --   --   --   --  3.3  --   --   --   --  2.2*  --    < > = values in this interval not displayed.   GFR: Estimated Creatinine Clearance: 90.5 mL/min (by C-G formula based on SCr of 0.77 mg/dL). Recent Labs  Lab 01/03/24 1633 01/03/24 1738 01/03/24 1840 01/03/24 2036 01/03/24 2048 01/04/24 0341 01/04/24 0413 01/05/24 0505  PROCALCITON  --   --  0.23  --   --   --   --   --   WBC 23.1*  --   --  22.7*  --   --  21.3* 30.0*  LATICACIDVEN  --  1.0  --   --  1.3 1.0  --   --     Liver Function Tests: Recent Labs  Lab 01/03/24 1633 01/04/24 0413 01/05/24 0505  AST 90* 50* 35  ALT 56* 41 40  ALKPHOS 56 49 45  BILITOT 1.4* 0.8 0.7  PROT 6.8 5.7* 5.7*  ALBUMIN 3.4* 2.6* 2.5*   Recent Labs  Lab 01/03/24 1840  LIPASE 26   No results for input(s): AMMONIA in the last 168 hours.  ABG    Component Value Date/Time   PHART 7.32 (L) 01/04/2024 0418   PCO2ART 56 (H) 01/04/2024 0418   PO2ART 82 (L) 01/04/2024 0418   HCO3 28.9 (H) 01/04/2024 0418   O2SAT 97.5 01/04/2024 0418     Coagulation Profile: No results for input(s): INR, PROTIME in the last 168 hours.  Cardiac Enzymes: No results for input(s): CKTOTAL, CKMB, CKMBINDEX, TROPONINI in the last 168 hours.  HbA1C: Hemoglobin A1C  Date/Time Value Ref Range Status  07/25/2021 12:00 AM 7.0  Final   Hgb A1c MFr Bld  Date/Time Value Ref Range Status  01/04/2024 01:06 AM 6.9 (H) 4.8 - 5.6 %  Final    Comment:    (NOTE) Pre diabetes:          5.7%-6.4%  Diabetes:              >6.4%  Glycemic control for   <7.0% adults with diabetes   03/24/2023 11:07 AM 7.1 (H) 4.8 - 5.6 % Final    Comment:             Prediabetes: 5.7 - 6.4          Diabetes: >6.4          Glycemic control for adults with diabetes: <7.0     CBG: Recent Labs  Lab 01/04/24 1914 01/04/24 2315 01/05/24 0315 01/05/24 0723 01/05/24 1118  GLUCAP 361* 349* 283* 250* 174*    Review of Systems:   Unable to obtain  Past Medical History:  She,  has a past medical history of Anxiety, Depression, Elevated BP, Hypercholesterolemia, Increased BMI, Menopause, Morbid obesity (HCC), and Type I diabetes mellitus (HCC).   Surgical History:   Past Surgical History:  Procedure Laterality Date   BREAST BIOPSY Left 05/09/2020   ribbon clip, stereo bx, HEMANGIOMA, BENIGN. - MAMMARY EPITHELIUM IS NOT PRESENT.   CATARACT EXTRACTION W/PHACO Left 11/08/2020   Procedure: CATARACT EXTRACTION PHACO AND INTRAOCULAR LENS PLACEMENT (IOC) LEFT DIABETIC 4.14 00:52.1 ;  Surgeon: Jaye Fallow, MD;  Location[facility];  Service: Ophthalmology;  Laterality: Left;   CATARACT EXTRACTION W/PHACO Right 12/04/2020   Procedure: CATARACT EXTRACTION PHACO AND INTRAOCULAR LENS PLACEMENT (IOC) RIGHT DIABETIC 6.11 00:46.1;  Surgeon: Jaye Fallow, MD;  Location[facility];  Service: Ophthalmology;  Laterality: Right;  Diabetic - insulin    CESAREAN SECTION  1997   COLONOSCOPY WITH PROPOFOL  N/A 01/18/2021   Procedure: COLONOSCOPY WITH PROPOFOL ;  Surgeon: Jinny Carmine, MD;  Location: [facility];  Service: Endoscopy;  Laterality: N/A;  priority 4     Social History:   reports that she has quit smoking. Her smoking use included cigarettes. She has a 10 pack-year smoking history. She has never used smokeless tobacco. She reports that she does not drink alcohol and does not use drugs.   Family History:   Her family history includes Breast cancer in her maternal aunt and maternal grandmother; Cancer in her father; Colon cancer in her maternal grandfather and paternal grandfather; Colon polyps in her mother; Diabetes in her maternal grandfather; Hyperlipidemia in her mother; Hypertension in her mother. There is no history of Heart disease or Ovarian cancer.   Allergies Allergies  Allergen Reactions   Codeine Nausea And Vomiting     Home Medications  Prior to Admission medications   Medication Sig Start Date End Date Taking? Authorizing Provider  meloxicam (MOBIC) 7.5 MG tablet Take 7.5 mg by mouth daily. 09/07/23  Yes [provider]  amLODipine  (NORVASC ) 10 MG tablet TAKE 1 TABLET BY MOUTH DAILY 08/05/23   Glendia Shad, MD  BAYER MICROLET LANCETS lancets USE AS DIRECTED TO CHECK  SUGARS 1 TO 2 TIMES A DAY 01/15/18   Glendia Shad, MD  cholecalciferol (VITAMIN D3) 25 MCG (1000 UT) tablet Take 1,000 Units by mouth daily.    [provider]  CONTOUR NEXT TEST test strip USE TO CHECK BLOOD GLUCOSE 6 TO  7 TIMES DAILY 07/10/23   Glendia Shad, MD  Cyanocobalamin  (B-12 PO) Take by mouth.    [provider]  hydrochlorothiazide  (HYDRODIURIL ) 25 MG tablet TAKE 1 TABLET BY MOUTH DAILY 03/03/23   Glendia Shad, MD  insulin  glargine (LANTUS  SOLOSTAR) 100 UNIT/ML Solostar Pen INJECT SUBCUTANEOUSLY 80 UNITS  AT NIGHT 12/11/23   Glendia Shad, MD  insulin  lispro (HUMALOG  KWIKPEN) 100 UNIT/ML KwikPen INJECT SUBCUTANEOUSLY 65  UNITS DAILY.  (Takes distributed over three meals counting carbs) 07/15/23   Glendia Shad, MD  Insulin  Pen Needle 32G X 4 MM MISC Use as directed with insulin  4 x daily. 07/15/23   Glendia Shad, MD  levocetirizine (XYZAL ) 5 MG tablet Take 1 tablet (5 mg total) by mouth every evening. 03/17/23   Glendia Shad, MD  lisinopril  (ZESTRIL ) 40 MG tablet Take 1 tablet (40 mg total) by mouth daily. 08/05/23   Glendia Shad, MD  REPATHA  SURECLICK 140 MG/ML SOAJ  INJECT 1 PEN SUBCUTANEOUSLY  EVERY 2 WEEKS 12/30/23   Glendia Shad, MD  sertraline  (ZOLOFT ) 50 MG tablet TAKE 1 TABLET BY MOUTH DAILY 03/03/23   Glendia Shad, MD     Critical care time: 51 minutes    Belva November, MD [facility] 01/05/2024 1:33 PM

## 2024-01-05 NOTE — Plan of Care (Signed)
 Patient extubated this am and placed on bi-pap. No complaints of pain. Patient remains NPO. Foley intact with adequate urine output. Patient became hypertensive and Nicardipine  started and then titrated off. Per Dr. Isadora go by cuff pressure verses a-line. Sodium levels checked throughout the shift. At this time hypertonic saline solution discontinued. Will re-check at 17:30. Continue to assess.

## 2024-01-05 NOTE — Consult Note (Signed)
 PHARMACY CONSULT NOTE - Hypertonic Saline  Pharmacy Consult for Hypertonic Saline Monitoring and Management  Recent Labs: Potassium (mmol/L)  Date Value  01/05/2024 4.7   Magnesium  (mg/dL)  Date Value  97/95/7974 2.3   Calcium  (mg/dL)  Date Value  97/95/7974 8.1 (L)   Albumin (g/dL)  Date Value  97/95/7974 2.5 (L)  03/24/2023 4.2   Phosphorus (mg/dL)  Date Value  97/95/7974 2.2 (L)   Sodium (mmol/L)  Date Value  01/05/2024 115 (LL)  04/29/2023 136   Assessment  NHYIRA LEANO is a 63 y.o. female presenting with cough, shortness of breath and decrease po intake in addition to N/V. They were intubated due to respiratory distress secondary to influenza. They were found to be hyponatremic possibly due to offending PTA medications. However, none have been re-started while inpatient. Pharmacy has been consulted to monitor hypertonic saline (3%) infusion.  Pertinent medications:  Hydrochlorothiazide  (not started inpatient) Sertraline  (not started inpatient)  Baseline Labs: Serum Na 115, Serum Osm ordered, Urine Na ordered, Urine Osm ordered  Goal of Therapy:  Increase in Na by 4-6 mEq/L in 4-6 hours Do not exceed increase in Na by 10-12 mEq/L in 24 hours  Monitoring:  Date Time Na Rate/Comment  0204 1736 115 Baseline before starting rate @ 25 ml/hr (Was on at [facility] 3% 20 ml/hr until 1340 on 01/05/24) F/u for timing of start of drip at this new rate  Plan:  Initiate 3% saline at 25 mL/hr Check Na Q2H x2 then Q4H  Stop infusion if  Na increases > 4 mEq/L in first 2 hours Na increases > 6 mEq/L in first 4 hours Na increases > 6 mEq/L in 6 hours  Na increases > 8 mEq/L in 8 hours (give D5W bolus) Continue to monitor for signs of clinical improvement and recommendations per nephrology  Suzann Allean DELENA, PharmD 01/05/2024 6:24 PM

## 2024-01-05 NOTE — Consult Note (Signed)
 PHARMACY CONSULT NOTE - Hypertonic Saline  Pharmacy Consult for Hypertonic Saline Monitoring and Management  Recent Labs: Potassium (mmol/L)  Date Value  01/05/2024 4.7   Magnesium  (mg/dL)  Date Value  97/95/7974 2.3   Calcium  (mg/dL)  Date Value  97/95/7974 8.1 (L)   Albumin (g/dL)  Date Value  97/95/7974 2.5 (L)  03/24/2023 4.2   Phosphorus (mg/dL)  Date Value  97/95/7974 2.2 (L)   Sodium (mmol/L)  Date Value  01/05/2024 118 (LL)  04/29/2023 136   Assessment  Jill Crosby is a 63 y.o. female presenting with cough, shortness of breath and decrease po intake in addition to N/V. They were intubated due to respiratory distress secondary to influenza. They were found to be hyponatremic possibly due to offending PTA medications. However, none have been re-started while inpatient. Pharmacy has been consulted to monitor hypertonic saline (3%) infusion.  Pertinent medications:  Hydrochlorothiazide  (not started inpatient) Sertraline  (not started inpatient)  Baseline Labs: Serum Na 115, Serum Osm ordered, Urine Na ordered, Urine Osm ordered  Goal of Therapy:  Increase in Na by 4-6 mEq/L in 4-6 hours Do not exceed increase in Na by 10-12 mEq/L in 24 hours  Monitoring:  Date Time Na Rate/Comment  (Was on at Nacl 3% 20 ml/hr until 1340 on 01/05/24) 0204 1829 - Gtt re-started at 25 mL/hour 0204  1833 116 Baseline 0204 2047 118 Increased 2 mEq in 2 hours  Plan:  Continue 3% saline at 25 mL/hr Check Na Q2H x2 then Q4H  Stop infusion if  Na increases > 4 mEq/L in first 2 hours Na increases > 6 mEq/L in first 4 hours Na increases > 6 mEq/L in 6 hours  Na increases > 8 mEq/L in 8 hours (give D5W bolus) Continue to monitor for signs of clinical improvement and recommendations per nephrology  Thank you for involving pharmacy in this patient's care.   Damien Napoleon, PharmD Clinical Pharmacist 01/05/2024 9:39 PM

## 2024-01-05 NOTE — Progress Notes (Signed)
Notified Dr. Cherylann Ratel Serum Osmolarity of 244. Continue to assess.

## 2024-01-05 NOTE — Inpatient Diabetes Management (Signed)
Inpatient Diabetes Program Recommendations  AACE/ADA: New Consensus Statement on Inpatient Glycemic Control (2015)  Target Ranges:  Prepandial:   less than 140 mg/dL      Peak postprandial:   less than 180 mg/dL (1-2 hours)      Critically ill patients:  140 - 180 mg/dL   Lab Results  Component Value Date   GLUCAP 250 (H) 01/05/2024   HGBA1C 6.9 (H) 01/04/2024    Latest Reference Range & Units 01/04/24 13:21 01/04/24 14:16 01/04/24 15:22 01/04/24 19:14 01/04/24 23:15 01/05/24 03:15 01/05/24 07:23  Glucose-Capillary 70 - 99 mg/dL 409 (H) 811 (H) 914 (H) 361 (H) 349 (H) 283 (H) 250 (H)  (H): Data is abnormally high  Diabetes history: DM1 Outpatient Diabetes medications: Lantus 80 units daily, Humalog total 65 units divided between 3 meals Current orders for Inpatient glycemic control:Semglee 20 units bid, Novolog 0-6 units q 4 hrs.   Inpatient Diabetes Program Recommendations:   Noted patient admitted with dx respiratory failure with Influenza A.   Please consider:  -Increase Semglee 25 units bid  -Add Novolog 3 units q 4 hrs. Feeding coverage (hold if tube feed held or stopped for any reason)  Thank you, Darel Hong E. Gracen Ringwald, RN, MSN, CDCES  Diabetes Coordinator Inpatient Glycemic Control Team Team Pager [phone] (8am-5pm) 01/05/2024 9:10 AM

## 2024-01-05 NOTE — Progress Notes (Signed)
 [facility]  ROUNDING NOTE   Subjective:   Patient status post extubation. Currently on BiPAP. Serum sodium up to 112. Remains on 3% saline.  Objective:  Vital signs in last 24 hours:  Temp:  [96.4 F (35.8 C)-98.8 F (37.1 C)] 97.5 F (36.4 C) (02/04 1600) Pulse Rate:  [43-84] 84 (02/04 1600) Resp:  [10-23] 16 (02/04 1600) BP: (98-186)/(43-61) 141/57 (02/04 1600) SpO2:  [91 %-100 %] 93 % (02/04 1600) Arterial Line BP: (103-212)/(39-62) 167/42 (02/04 1445) FiO2 (%):  [35 %-50 %] 35 % (02/04 1120) Weight:  [119.8 kg] 119.8 kg (02/04 0330)  Weight change: -2.2 kg Filed Weights   01/03/24 2021 01/04/24 0500 01/05/24 0330  Weight: 114.1 kg 120.1 kg 119.8 kg    Intake/Output: I/O last 3 completed shifts: In: 5614.9 [I.V.:2356.5; NG/GT:240; IV Piggyback:3018.4] Out: 5200 [Urine:3700; Emesis/NG output:1500]   Intake/Output this shift:  Total I/O In: 681.9 [I.V.:331.8; IV Piggyback:350] Out: 1275 [Urine:1275]  Physical Exam: General: Critically ill-appearing  Head: Normocephalic, atraumatic. Moist oral mucosal membranes  Neck: Supple  Lungs:  Scattered rhonchi, on BiPAP  Heart: S1S2 no rubs  Abdomen:  Soft, nontender, bowel sounds present  Extremities: Trace peripheral edema.  Neurologic: Arousable  Skin: No acute rash  Access: No hemodialysis access    Basic Metabolic Panel: Recent Labs  Lab 01/03/24 1633 01/03/24 1735 01/03/24 1840 01/03/24 2036 01/04/24 0106 01/04/24 0413 01/04/24 0532 01/04/24 2221 01/05/24 0125 01/05/24 0505 01/05/24 0935 01/05/24 1333  NA <102*   < >  --  103*   < > 108*   < > 109* 110* 110* 112* 115*  K 3.2*  --   --   --   --  3.8  --   --   --  4.7  --   --   CL <65*  --   --   --   --  74*  --   --   --  77*  --   --   CO2 24  --   --   --   --  27  --   --   --  26  --   --   GLUCOSE 324*  --   --   --   --  190*  --   --   --  260*  --   --   BUN 10  --   --   --   --  11  --   --   --  14  --   --    CREATININE 0.63  --  0.64  --   --  0.80  --   --   --  0.77  --   --   CALCIUM  8.0*  --   --   --   --  7.8*  --   --   --  8.1*  --   --   MG  --   --   --  2.2  --  2.1  --   --   --  2.3  --   --   PHOS  --   --   --   --   --  3.3  --   --   --  2.2*  --   --    < > = values in this interval not displayed.    Liver Function Tests: Recent Labs  Lab 01/03/24 1633 01/04/24 0413 01/05/24 0505  AST 90* 50* 35  ALT  56* 41 40  ALKPHOS 56 49 45  BILITOT 1.4* 0.8 0.7  PROT 6.8 5.7* 5.7*  ALBUMIN 3.4* 2.6* 2.5*   Recent Labs  Lab 01/03/24 1840  LIPASE 26   No results for input(s): AMMONIA in the last 168 hours.  CBC: Recent Labs  Lab 01/03/24 1633 01/03/24 2036 01/04/24 0413 01/05/24 0505  WBC 23.1* 22.7* 21.3* 30.0*  NEUTROABS 20.6*  --   --   --   HGB 12.0 12.4 10.9* 10.3*  HCT RESULTS UNAVAILABLE DUE TO INTERFERING SUBSTANCE RESULTS UNAVAILABLE DUE TO INTERFERING SUBSTANCE 29.2* 28.2*  MCV RESULTS UNAVAILABLE DUE TO INTERFERING SUBSTANCE RESULTS UNAVAILABLE DUE TO INTERFERING SUBSTANCE 79.6* 82.0  PLT 323 291 299 340    Cardiac Enzymes: No results for input(s): CKTOTAL, CKMB, CKMBINDEX, TROPONINI in the last 168 hours.  BNP: Invalid input(s): POCBNP  CBG: Recent Labs  Lab 01/04/24 2315 01/05/24 0315 01/05/24 0723 01/05/24 1118 01/05/24 1555  GLUCAP 349* 283* 250* 174* 159*    Microbiology: Results for orders placed or performed during the hospital encounter of 01/03/24  Resp panel by RT-PCR (RSV, Flu A&B, Covid) Anterior Nasal Swab     Status: Abnormal   Collection Time: 01/03/24  2:52 PM   Specimen: Anterior Nasal Swab  Result Value Ref Range Status   SARS Coronavirus 2 by RT PCR NEGATIVE NEGATIVE Final    Comment: (NOTE) SARS-CoV-2 target nucleic acids are NOT DETECTED.  The SARS-CoV-2 RNA is generally detectable in upper respiratory specimens during the acute phase of infection. The lowest concentration of SARS-CoV-2 viral copies  this assay can detect is 138 copies/mL. A negative result does not preclude SARS-Cov-2 infection and should not be used as the sole basis for treatment or other patient management decisions. A negative result may occur with  improper specimen collection/handling, submission of specimen other than nasopharyngeal swab, presence of viral mutation(s) within the areas targeted by this assay, and inadequate number of viral copies(<138 copies/mL). A negative result must be combined with clinical observations, patient history, and epidemiological information. The expected result is Negative.  Fact Sheet for Patients:  bloggercourse.com  Fact Sheet for Healthcare Providers:  seriousbroker.it  This test is no t yet approved or cleared by the [place]  FDA and  has been authorized for detection and/or diagnosis of SARS-CoV-2 by FDA under an Emergency Use Authorization (EUA). This EUA will remain  in effect (meaning this test can be used) for the duration of the COVID-19 declaration under Section 564(b)(1) of the Act, 21 U.S.C.section 360bbb-3(b)(1), unless the authorization is terminated  or revoked sooner.       Influenza A by PCR POSITIVE (A) NEGATIVE Final   Influenza B by PCR NEGATIVE NEGATIVE Final    Comment: (NOTE) The Xpert Xpress SARS-CoV-2/FLU/RSV plus assay is intended as an aid in the diagnosis of influenza from Nasopharyngeal swab specimens and should not be used as a sole basis for treatment. Nasal washings and aspirates are unacceptable for Xpert Xpress SARS-CoV-2/FLU/RSV testing.  Fact Sheet for Patients: bloggercourse.com  Fact Sheet for Healthcare Providers: seriousbroker.it  This test is not yet approved or cleared by the [place]  FDA and has been authorized for detection and/or diagnosis of SARS-CoV-2 by FDA under an Emergency Use Authorization (EUA). This  EUA will remain in effect (meaning this test can be used) for the duration of the COVID-19 declaration under Section 564(b)(1) of the Act, 21 U.S.C. section 360bbb-3(b)(1), unless the authorization is terminated or revoked.     Resp Syncytial  Virus by PCR NEGATIVE NEGATIVE Final    Comment: (NOTE) Fact Sheet for Patients: bloggercourse.com  Fact Sheet for Healthcare Providers: seriousbroker.it  This test is not yet approved or cleared by the [place]  FDA and has been authorized for detection and/or diagnosis of SARS-CoV-2 by FDA under an Emergency Use Authorization (EUA). This EUA will remain in effect (meaning this test can be used) for the duration of the COVID-19 declaration under Section 564(b)(1) of the Act, 21 U.S.C. section 360bbb-3(b)(1), unless the authorization is terminated or revoked.  Performed at [facility], [address] [place], [place] [address]   Blood culture (routine x 2)     Status: None (Preliminary result)   Collection Time: 01/03/24  5:43 PM   Specimen: BLOOD LEFT HAND  Result Value Ref Range Status   Specimen Description BLOOD LEFT HAND  Final   Special Requests   Final    BOTTLES DRAWN AEROBIC AND ANAEROBIC Blood Culture results may not be optimal due to an inadequate volume of blood received in culture bottles   Culture   Final    NO GROWTH 2 DAYS Performed at [facility], [address] [place], [place] [address]    Report Status PENDING  Incomplete  Blood culture (routine x 2)     Status: None (Preliminary result)   Collection Time: 01/03/24  6:40 PM   Specimen: BLOOD RIGHT HAND  Result Value Ref Range Status   Specimen Description BLOOD RIGHT HAND  Final   Special Requests   Final    BOTTLES DRAWN AEROBIC AND ANAEROBIC Blood Culture results may not be optimal due to an inadequate volume of blood received in culture bottles   Culture   Final    NO GROWTH 2  DAYS Performed at [facility], [address], [place], [place] [address]    Report Status PENDING  Incomplete  Culture, blood (Routine X 2) w Reflex to ID Panel     Status: None (Preliminary result)   Collection Time: 01/03/24  8:36 PM   Specimen: BLOOD  Result Value Ref Range Status   Specimen Description BLOOD RIGHT HAND  Final   Special Requests   Final    BOTTLES DRAWN AEROBIC ONLY Blood Culture results may not be optimal due to an inadequate volume of blood received in culture bottles   Culture   Final    NO GROWTH 2 DAYS Performed at [facility], [address], [place], [place] [address]    Report Status PENDING  Incomplete  Culture, blood (Routine X 2) w Reflex to ID Panel     Status: None (Preliminary result)   Collection Time: 01/03/24  8:40 PM   Specimen: BLOOD  Result Value Ref Range Status   Specimen Description BLOOD BLOOD LEFT HAND  Final   Special Requests   Final    BOTTLES DRAWN AEROBIC AND ANAEROBIC Blood Culture results may not be optimal due to an inadequate volume of blood received in culture bottles   Culture   Final    NO GROWTH 2 DAYS Performed at [facility], [address] [place], [place] [address]    Report Status PENDING  Incomplete  MRSA Next Gen by PCR, Nasal     Status: None   Collection Time: 01/03/24  8:45 PM   Specimen: Nasal Mucosa; Nasal Swab  Result Value Ref Range Status   MRSA by PCR Next Gen NOT DETECTED NOT DETECTED Final    Comment: (NOTE) The  GeneXpert MRSA Assay (FDA approved for NASAL specimens only), is one component of a comprehensive MRSA colonization surveillance program. It is not intended to diagnose MRSA infection nor to guide or monitor treatment for MRSA infections. Test performance is not FDA approved in patients less than 37 years old. Performed at [facility], [address] [place], [place] [address]   Respiratory (~20 pathogens) panel by PCR     Status: Abnormal    Collection Time: 01/04/24  5:28 PM   Specimen: Nasopharyngeal Swab; Respiratory  Result Value Ref Range Status   Adenovirus NOT DETECTED NOT DETECTED Final   Coronavirus 229E NOT DETECTED NOT DETECTED Final    Comment: (NOTE) The Coronavirus on the Respiratory Panel, DOES NOT test for the novel  Coronavirus (2019 nCoV)    Coronavirus HKU1 NOT DETECTED NOT DETECTED Final   Coronavirus NL63 NOT DETECTED NOT DETECTED Final   Coronavirus OC43 NOT DETECTED NOT DETECTED Final   Metapneumovirus NOT DETECTED NOT DETECTED Final   Rhinovirus / Enterovirus NOT DETECTED NOT DETECTED Final   Influenza A H1 2009 DETECTED (A) NOT DETECTED Final   Influenza B NOT DETECTED NOT DETECTED Final   Parainfluenza Virus 1 NOT DETECTED NOT DETECTED Final   Parainfluenza Virus 2 NOT DETECTED NOT DETECTED Final   Parainfluenza Virus 3 NOT DETECTED NOT DETECTED Final   Parainfluenza Virus 4 NOT DETECTED NOT DETECTED Final   Respiratory Syncytial Virus NOT DETECTED NOT DETECTED Final   Bordetella pertussis NOT DETECTED NOT DETECTED Final   Bordetella Parapertussis NOT DETECTED NOT DETECTED Final   Chlamydophila pneumoniae NOT DETECTED NOT DETECTED Final   Mycoplasma pneumoniae NOT DETECTED NOT DETECTED Final    Comment: Performed at [facility], [address] [place], [place] [address]    Coagulation Studies: No results for input(s): LABPROT, INR in the last 72 hours.  Urinalysis: Recent Labs    01/03/24 2204  COLORURINE STRAW*  LABSPEC 1.005  PHURINE 6.0  GLUCOSEU >=500*  HGBUR NEGATIVE  BILIRUBINUR NEGATIVE  KETONESUR NEGATIVE  PROTEINUR NEGATIVE  NITRITE NEGATIVE  LEUKOCYTESUR NEGATIVE      Imaging: ECHOCARDIOGRAM COMPLETE Result Date: 01/05/2024    ECHOCARDIOGRAM REPORT   Patient Name:   [facility] Saleeby Date of Exam: 01/05/2024 Medical Rec #:  [id number]        Height:       64.0 in Accession #:    [id number]       Weight:       264.1 lb Date of Birth:  Aug 20, 1961        BSA:           2.202 m Patient Age:    62 years         BP:           98/53 mmHg Patient Gender: F                HR:           51 bpm. Exam Location:  [facility] Procedure: 2D Echo, Cardiac Doppler and Color Doppler Indications:     Dyspnea  History:         Patient has no prior history of Echocardiogram examinations.                  Signs/Symptoms:Dyspnea and Dizziness/Lightheadedness; Risk                  Factors:Hypertension, Diabetes and Current Smoker.  Sonographer:     Naomie Reef  Referring Phys:  [id number] KHABIB DGAYLI Diagnosing Phys: Lonni End MD  Sonographer Comments: Technically difficult study due to poor echo windows, echo performed with patient supine and on artificial respirator and patient is obese. IMPRESSIONS  1. Left ventricular ejection fraction, by estimation, is 55 to 60%. The left ventricle has normal function. Left ventricular endocardial border not optimally defined to evaluate regional wall motion. The left ventricular internal cavity size was mildly dilated. There is mild left ventricular hypertrophy. Left ventricular diastolic parameters are consistent with Grade II diastolic dysfunction (pseudonormalization).  2. Right ventricular systolic function is normal. The right ventricular size is normal.  3. Left atrial size was mildly dilated.  4. The mitral valve is grossly normal. Mild mitral valve regurgitation. No evidence of mitral stenosis.  5. The aortic valve is tricuspid. Aortic valve regurgitation is not visualized. No aortic stenosis is present. FINDINGS  Left Ventricle: Left ventricular ejection fraction, by estimation, is 55 to 60%. The left ventricle has normal function. Left ventricular endocardial border not optimally defined to evaluate regional wall motion. The left ventricular internal cavity size was mildly dilated. There is mild left ventricular hypertrophy. Left ventricular diastolic parameters are consistent with Grade II diastolic dysfunction (pseudonormalization). Right  Ventricle: The right ventricular size is normal. No increase in right ventricular wall thickness. Right ventricular systolic function is normal. Left Atrium: Left atrial size was mildly dilated. Right Atrium: Right atrial size was normal in size. Pericardium: There is no evidence of pericardial effusion. Mitral Valve: The mitral valve is grossly normal. Mild mitral valve regurgitation. No evidence of mitral valve stenosis. MV peak gradient, 5.2 mmHg. The mean mitral valve gradient is 2.0 mmHg. Tricuspid Valve: The tricuspid valve is not well visualized. Tricuspid valve regurgitation is not demonstrated. Aortic Valve: The aortic valve is tricuspid. There is mild aortic valve annular calcification. Aortic valve regurgitation is not visualized. No aortic stenosis is present. Aortic valve mean gradient measures 8.0 mmHg. Aortic valve peak gradient measures 15.5 mmHg. Aortic valve area, by VTI measures 1.66 cm. Pulmonic Valve: The pulmonic valve was not well visualized. Pulmonic valve regurgitation is not visualized. No evidence of pulmonic stenosis. Aorta: The aortic root is normal in size and structure. Venous: IVC assessment for right atrial pressure unable to be performed due to mechanical ventilation. IAS/Shunts: The interatrial septum was not well visualized.  LEFT VENTRICLE PLAX 2D LVIDd:         5.30 cm     Diastology LVIDs:         3.00 cm     LV e' medial:    7.51 cm/s LV PW:         1.10 cm     LV E/e' medial:  15.6 LV IVS:        1.00 cm     LV e' lateral:   7.83 cm/s LVOT diam:     2.00 cm     LV E/e' lateral: 14.9 LV SV:         82 LV SV Index:   37 LVOT Area:     3.14 cm  LV Volumes (MOD) LV vol d, MOD A2C: 52.9 ml LV vol d, MOD A4C: 54.9 ml LV vol s, MOD A2C: 19.3 ml LV vol s, MOD A4C: 24.6 ml LV SV MOD A2C:     33.6 ml LV SV MOD A4C:     54.9 ml LV SV MOD BP:      33.2 ml RIGHT VENTRICLE RV Basal diam:  2.90  cm RV Mid diam:    2.80 cm RV S prime:     9.03 cm/s TAPSE (M-mode): 2.4 cm LEFT ATRIUM              Index        RIGHT ATRIUM           Index LA diam:        4.60 cm 2.09 cm/m   RA Area:     15.30 cm LA Vol (A2C):   76.3 ml 34.65 ml/m  RA Volume:   39.00 ml  17.71 ml/m LA Vol (A4C):   58.9 ml 26.75 ml/m LA Biplane Vol: 71.6 ml 32.52 ml/m  AORTIC VALVE                     PULMONIC VALVE AV Area (Vmax):    1.66 cm      PV Vmax:       0.98 m/s AV Area (Vmean):   1.67 cm      PV Peak grad:  3.8 mmHg AV Area (VTI):     1.66 cm AV Vmax:           197.00 cm/s AV Vmean:          131.000 cm/s AV VTI:            0.491 m AV Peak Grad:      15.5 mmHg AV Mean Grad:      8.0 mmHg LVOT Vmax:         104.00 cm/s LVOT Vmean:        69.600 cm/s LVOT VTI:          0.260 m LVOT/AV VTI ratio: 0.53  AORTA Ao Root diam: 3.10 cm MITRAL VALVE MV Area (PHT): 3.02 cm     SHUNTS MV Area VTI:   1.68 cm     Systemic VTI:  0.26 m MV Peak grad:  5.2 mmHg     Systemic Diam: 2.00 cm MV Mean grad:  2.0 mmHg MV Vmax:       1.14 m/s MV Vmean:      57.9 cm/s MV Decel Time: 251 msec MV E velocity: 117.00 cm/s MV A velocity: 87.40 cm/s MV E/A ratio:  1.34 [name] MD Electronically signed by [name] MD Signature Date/Time: 01/05/2024/10:09:06 AM    Final    DG Chest Port 1 View Addendum Date: 01/04/2024 ADDENDUM REPORT: 01/04/2024 00:34 ADDENDUM: Addendum to include position of the central line. Right IJ CVC tip in the low SVC.  No pneumothorax. Electronically Signed   By: Norman Gatlin M.D.   On: 01/04/2024 00:34   Result Date: 01/04/2024 CLINICAL DATA:  Central line placement EXAM: PORTABLE CHEST 1 VIEW COMPARISON:  Radiograph 01/03/2024 at 6:40 p.m. FINDINGS: Stable cardiomegaly. Patchy bilateral airspace and interstitial opacities are similar. Subdiaphragmatic enteric tube. The endotracheal tube tip is approximately 2.3 cm from the carina. IMPRESSION: 1. Endotracheal tube tip is approximately 2.3 cm from the carina. 2. Similar cardiomegaly and bilateral airspace opacities. Electronically Signed: By: Norman Gatlin  M.D. On: 01/04/2024 00:16   DG Abd 1 View Result Date: 01/04/2024 CLINICAL DATA:  Abdominal distension EXAM: ABDOMEN - 1 VIEW COMPARISON:  Radiograph 01/03/2024 at 6:43 p.m. FINDINGS: Enteric tube tip and side-port in the stomach. IMPRESSION: Enteric tube tip and side-port in the stomach. Electronically Signed   By: Norman Gatlin M.D.   On: 01/04/2024 00:15   CT HEAD WO CONTRAST ( ) Result Date: 01/03/2024 CLINICAL  DATA:  Altered mental status EXAM: CT HEAD WITHOUT CONTRAST TECHNIQUE: Contiguous axial images were obtained from the base of the skull through the vertex without intravenous contrast. RADIATION DOSE REDUCTION: This exam was performed according to the departmental dose-optimization program which includes automated exposure control, adjustment of the mA and/or kV according to patient size and/or use of iterative reconstruction technique. COMPARISON:  None Available. FINDINGS: Brain: No mass,hemorrhage or extra-axial collection. Normal appearance of the parenchyma and CSF spaces. Vascular: No hyperdense vessel or unexpected vascular calcification. Skull: The visualized skull base, calvarium and extracranial soft tissues are normal. Sinuses/Orbits: Left mastoid effusion. Normal orbits. Small right maxillary sinus retention cyst. Other: None. IMPRESSION: 1. No acute intracranial abnormality. 2. Left mastoid effusion, possibly due to intubation. Electronically Signed   By: Franky Stanford M.D.   On: 01/03/2024 20:24   DG Abdomen 1 View Result Date: 01/03/2024 CLINICAL DATA:  Orogastric tube placement. EXAM: ABDOMEN - 1 VIEW COMPARISON:  None Available. FINDINGS: Distal tip of nasogastric tube is seen in proximal stomach. IMPRESSION: Distal tip of nasogastric tube seen in proximal stomach. Electronically Signed   By: Lynwood Landy Raddle M.D.   On: 01/03/2024 18:59   DG Chest Portable 1 View Result Date: 01/03/2024 CLINICAL DATA:  Endotracheal tube placement. EXAM: PORTABLE CHEST 1 VIEW COMPARISON:  Same  day. FINDINGS: Endotracheal tube tip is 2 cm above the carina in grossly good position. Nasogastric tube is seen entering stomach. Stable cardiomegaly and bilateral lung opacities. IMPRESSION: Endotracheal tube in grossly good position. Electronically Signed   By: Lynwood Landy Raddle M.D.   On: 01/03/2024 18:59     Medications:    azithromycin  Stopped (01/05/24 0739)   cefTRIAXone  (ROCEPHIN )  IV Stopped (01/05/24 9365)   dexmedetomidine  (PRECEDEX ) IV infusion Stopped (01/05/24 9344)   fentaNYL  infusion INTRAVENOUS Stopped (01/04/24 0840)   niCARDipine  Stopped (01/05/24 1414)   norepinephrine  (LEVOPHED ) Adult infusion Stopped (01/05/24 9161)    Chlorhexidine  Gluconate Cloth  6 each Topical Daily   enoxaparin  (LOVENOX ) injection  60 mg Subcutaneous Q24H   famotidine   20 mg Per Tube BID   insulin  aspart  0-6 Units Subcutaneous Q4H   insulin  glargine-yfgn  25 Units Subcutaneous BID   nicotine   14 mg Transdermal Daily   mouth rinse  15 mL Mouth Rinse 4 times per day   oseltamivir   75 mg Per Tube BID   sodium chloride  flush  10-40 mL Intracatheter Q12H   dextrose , hydrALAZINE , mouth rinse, polyethylene glycol  Assessment/ Plan:  63 y.o. female with past medical history of diabetes mellitus type 1, hypertension, congestive heart failure, obesity, depression, hyperlipidemia who presented to emergency department with cough, shortness of breath, decreased p.o. intake as well as nausea and vomiting.  Found to be influenza A positive with respiratory distress and initially required mechanical ventilation.  1.  Hyponatremia.  Appears to be multifactorial.  Patient was on HCTZ as well as SSRIs at home.  Serum sodium improved to 115.  Restart 3% saline at 25 cc/h.  2.  Acute respiratory failure.  Patient now extubated.  Currently on BiPAP.  Patient being treated for influenza A.     LOS: 2 Jill Crosby 2/4/20254:26 PM

## 2024-01-05 NOTE — Progress Notes (Signed)
 Spoke with Dr. Hardy regarding patients fluctuating a-line readings. Systolic above 170 and diastolic 30-40's. A-line at the phlebostatic axis and flushed and pressure bag checked. Per Dr. Isadora he does not believe a-line reading. Orders to go with cuff-pressure. Continue to assess.

## 2024-01-05 NOTE — Progress Notes (Signed)
Per Dr. Aundria Rud start hypertonic solution at 25 ml/hr. Continue to assess.

## 2024-01-05 NOTE — Progress Notes (Signed)
Per Julieta Gutting do not re-start hypertonic solution until 17:30 lab results come back. Continue to assess.

## 2024-01-05 NOTE — Consult Note (Signed)
 PHARMACY CONSULT NOTE - ELECTROLYTES  Pharmacy Consult for Electrolyte Monitoring and Replacement   Recent Labs: Potassium (mmol/L)  Date Value  01/05/2024 4.7   Magnesium  (mg/dL)  Date Value  97/95/7974 2.3   Calcium  (mg/dL)  Date Value  97/95/7974 8.1 (L)   Albumin (g/dL)  Date Value  97/95/7974 2.5 (L)  03/24/2023 4.2   Phosphorus (mg/dL)  Date Value  97/95/7974 2.2 (L)   Sodium (mmol/L)  Date Value  01/05/2024 110 (LL)  04/29/2023 136    Height: 5' 4 (162.6 cm) Weight: 119.8 kg (264 lb 1.8 oz) IBW/kg (Calculated) : 54.7 Estimated Creatinine Clearance: 90.5 mL/min (by C-G formula based on SCr of 0.77 mg/dL).  Assessment  Jill Crosby is a 63 y.o. female presenting with severe hyponatremia and altered mental status. Patient is flu positive. PMH significant for T1DM, obesity, HTN. Pharmacy has been consulted to monitor and replace electrolytes.  Diet: NPO, OG tube in place MIVF: Hypertonic saline stopped Pertinent medications: N/A  Goal of Therapy: Electrolytes within normal limits  Plan:  Na 110, stabilized after interventions yesterday. Defer to PCCM team for management Phos 2.2, Phos-Nak 2 packets per tube x 1 dose Continue sodium checks q4h for now. Re-check BMP, Mg, Phos tomorrow AM  Thank you for allowing pharmacy to be a part of this patient's care.  Tai Skelly B Zaleah Ternes 01/05/2024 7:40 AM

## 2024-01-06 DIAGNOSIS — G928 Other toxic encephalopathy: Secondary | ICD-10-CM | POA: Diagnosis not present

## 2024-01-06 DIAGNOSIS — E109 Type 1 diabetes mellitus without complications: Secondary | ICD-10-CM

## 2024-01-06 DIAGNOSIS — E871 Hypo-osmolality and hyponatremia: Secondary | ICD-10-CM | POA: Diagnosis not present

## 2024-01-06 DIAGNOSIS — J9601 Acute respiratory failure with hypoxia: Secondary | ICD-10-CM | POA: Diagnosis not present

## 2024-01-06 LAB — BLOOD GAS, ARTERIAL
Acid-Base Excess: 6.6 mmol/L — ABNORMAL HIGH (ref 0.0–2.0)
Bicarbonate: 31.9 mmol/L — ABNORMAL HIGH (ref 20.0–28.0)
Delivery systems: POSITIVE
Expiratory PAP: 8 cm[H2O]
FIO2: 40 %
Inspiratory PAP: 14 cm[H2O]
O2 Saturation: 98.6 %
Patient temperature: 37
pCO2 arterial: 47 mm[Hg] (ref 32–48)
pH, Arterial: 7.44 (ref 7.35–7.45)
pO2, Arterial: 85 mm[Hg] (ref 83–108)

## 2024-01-06 LAB — CBC
HCT: 28.7 % — ABNORMAL LOW (ref 36.0–46.0)
Hemoglobin: 10.4 g/dL — ABNORMAL LOW (ref 12.0–15.0)
MCH: 30.1 pg (ref 26.0–34.0)
MCHC: 36.2 g/dL — ABNORMAL HIGH (ref 30.0–36.0)
MCV: 82.9 fL (ref 80.0–100.0)
Platelets: 367 10*3/uL (ref 150–400)
RBC: 3.46 MIL/uL — ABNORMAL LOW (ref 3.87–5.11)
RDW: 13 % (ref 11.5–15.5)
WBC: 21.2 10*3/uL — ABNORMAL HIGH (ref 4.0–10.5)
nRBC: 0 % (ref 0.0–0.2)

## 2024-01-06 LAB — COMPREHENSIVE METABOLIC PANEL
ALT: 33 U/L (ref 0–44)
AST: 28 U/L (ref 15–41)
Albumin: 2.5 g/dL — ABNORMAL LOW (ref 3.5–5.0)
Alkaline Phosphatase: 49 U/L (ref 38–126)
Anion gap: 7 (ref 5–15)
BUN: 10 mg/dL (ref 8–23)
CO2: 29 mmol/L (ref 22–32)
Calcium: 8.3 mg/dL — ABNORMAL LOW (ref 8.9–10.3)
Chloride: 84 mmol/L — ABNORMAL LOW (ref 98–111)
Creatinine, Ser: 0.63 mg/dL (ref 0.44–1.00)
GFR, Estimated: >60 mL/min (ref >60)
Glucose, Bld: 94 mg/dL (ref 70–99)
Potassium: 3.5 mmol/L (ref 3.5–5.1)
Sodium: 120 mmol/L — ABNORMAL LOW (ref 135–145)
Total Bilirubin: 0.9 mg/dL (ref 0.0–1.2)
Total Protein: 5.7 g/dL — ABNORMAL LOW (ref 6.5–8.1)

## 2024-01-06 LAB — LEGIONELLA PNEUMOPHILA SEROGP 1 UR AG: L. pneumophila Serogp 1 Ur Ag: NEGATIVE

## 2024-01-06 LAB — SODIUM
Sodium: 119 mmol/L — CL (ref 135–145)
Sodium: 122 mmol/L — ABNORMAL LOW (ref 135–145)
Sodium: 126 mmol/L — ABNORMAL LOW (ref 135–145)
Sodium: 127 mmol/L — ABNORMAL LOW (ref 135–145)
Sodium: 128 mmol/L — ABNORMAL LOW (ref 135–145)
Sodium: 126 mmol/L — ABNORMAL LOW (ref 135–145)

## 2024-01-06 LAB — GLUCOSE, CAPILLARY
Glucose-Capillary: 116 mg/dL — ABNORMAL HIGH (ref 70–99)
Glucose-Capillary: 65 mg/dL — ABNORMAL LOW (ref 70–99)
Glucose-Capillary: 98 mg/dL (ref 70–99)
Glucose-Capillary: 119 mg/dL — ABNORMAL HIGH (ref 70–99)
Glucose-Capillary: 229 mg/dL — ABNORMAL HIGH (ref 70–99)
Glucose-Capillary: 290 mg/dL — ABNORMAL HIGH (ref 70–99)
Glucose-Capillary: 288 mg/dL — ABNORMAL HIGH (ref 70–99)
Glucose-Capillary: 237 mg/dL — ABNORMAL HIGH (ref 70–99)

## 2024-01-06 LAB — MAGNESIUM: Magnesium: 2.2 mg/dL (ref 1.7–2.4)

## 2024-01-06 LAB — PHOSPHORUS: Phosphorus: 2.3 mg/dL — ABNORMAL LOW (ref 2.5–4.6)

## 2024-01-06 MED ORDER — DEXTROSE 5 % IV BOLUS: 250.0000 mL | Freq: Once | INTRAVENOUS | Status: DC

## 2024-01-06 MED ORDER — POTASSIUM & SODIUM PHOSPHATES 280-160-250 MG PO PACK
2.0000 | PACK | Freq: Once | ORAL | Status: AC
  Administered 2024-01-06: 2 via ORAL
  Filled 2024-01-06: qty 2

## 2024-01-06 MED ORDER — INSULIN ASPART 100 UNIT/ML IJ SOLN
10.0000 [IU] | Freq: Once | INTRAMUSCULAR | Status: AC
Start: 2024-01-06 — End: 2024-01-06
  Administered 2024-01-06: 10 [IU] via SUBCUTANEOUS

## 2024-01-06 MED ORDER — INSULIN ASPART 100 UNIT/ML IJ SOLN: 10.0000 [IU] | Freq: Once | INTRAMUSCULAR | Status: DC

## 2024-01-06 MED ORDER — DESMOPRESSIN ACETATE 4 MCG/ML IJ SOLN
2.0000 ug | Freq: Once | INTRAMUSCULAR | Status: AC
  Administered 2024-01-06: 2 ug via INTRAVENOUS
  Filled 2024-01-06: qty 1

## 2024-01-06 MED ORDER — DEXTROSE 5 % IV BOLUS
250.0000 mL | Freq: Once | INTRAVENOUS | Status: AC
  Administered 2024-01-06: 250 mL via INTRAVENOUS

## 2024-01-06 MED ORDER — DEXTROSE 5 % IV BOLUS
500.0000 mL | Freq: Once | INTRAVENOUS | Status: AC
  Administered 2024-01-06: 500 mL via INTRAVENOUS

## 2024-01-06 MED ORDER — OSELTAMIVIR PHOSPHATE 75 MG PO CAPS
75.0000 mg | ORAL_CAPSULE | Freq: Two times a day (BID) | ORAL | Status: AC
  Administered 2024-01-06 – 2024-01-08 (×5): 75 mg via ORAL
  Filled 2024-01-06 (×5): qty 1

## 2024-01-06 MED ORDER — INSULIN ASPART 100 UNIT/ML IJ SOLN
5.0000 [IU] | Freq: Once | INTRAMUSCULAR | Status: AC
  Administered 2024-01-06: 5 [IU] via SUBCUTANEOUS
  Filled 2024-01-06: qty 1

## 2024-01-06 MED ORDER — DEXTROSE 50 % IV SOLN
12.5000 g | INTRAVENOUS | Status: AC
  Administered 2024-01-06: 12.5 g via INTRAVENOUS

## 2024-01-06 MED ORDER — ORAL CARE MOUTH RINSE: 15.0000 mL | OROMUCOSAL | Status: DC | PRN

## 2024-01-06 MED ORDER — INSULIN GLARGINE-YFGN 100 UNIT/ML ~~LOC~~ SOLN
10.0000 [IU] | Freq: Two times a day (BID) | SUBCUTANEOUS | Status: DC
  Administered 2024-01-06 – 2024-01-07 (×2): 10 [IU] via SUBCUTANEOUS
  Filled 2024-01-06 (×2): qty 0.1

## 2024-01-06 NOTE — Inpatient Diabetes Management (Signed)
 Inpatient Diabetes Program Recommendations  AACE/ADA: New Consensus Statement on Inpatient Glycemic Control (2015)  Target Ranges:  Prepandial:   less than 140 mg/dL      Peak postprandial:   less than 180 mg/dL (1-2 hours)      Critically ill patients:  140 - 180 mg/dL    Latest Reference Range & Units 01/04/24 23:15 01/05/24 03:15 01/05/24 07:23 01/05/24 11:18 01/05/24 15:55 01/05/24 19:53 01/05/24 22:52  Glucose-Capillary 70 - 99 mg/dL 650 (H)  4 units Novolog   283 (H)  3 units Novolog   250 (H)  2 units Novolog   25 units Semglee  @0953   174 (H)  1 unit Novolog   159 (H)  1 unit Novolog   107 (H) 74     Semglee  Insulin  Held  (H): Data is abnormally high  Latest Reference Range & Units 01/05/24 23:38 01/06/24 03:48 01/06/24 04:25  Glucose-Capillary 70 - 99 mg/dL 76 65 (L) 883 (H)  (L): Data is abnormally low (H): Data is abnormally high     History: Type 1 diabetes  Home DM Meds: Lantus  80 units daily     Humalog  total 65 units divided between 3 meals   Current Orders: Semglee  25 units BID      Novolog  0-6 units Q4 hours    Extubated 02/04 Remains NPO    MD- Note Hypoglycemia this AM (CBG 65 at 4am) Semglee  25 units was HELD last PM--only got total of 25 units Semglee  yesterday AM + SSI throughout the day  Note pt remains NPO  May consider reducing the Semglee  back to 20 units BID (50% home dose)   --Will follow patient during hospitalization--  Adina Rudolpho Arrow RN, MSN, CDCES Diabetes Coordinator Inpatient Glycemic Control Team Team Pager: [phone] (8a-5p)

## 2024-01-06 NOTE — Progress Notes (Signed)
MD notified of patients sodium level 127. MD to enter orders. Continue to assess.

## 2024-01-06 NOTE — Plan of Care (Signed)
  Problem: Education: Goal: Knowledge of General Education information will improve Description: Including pain rating scale, medication(s)/side effects and non-pharmacologic comfort measures Outcome: Progressing   Problem: Health Behavior/Discharge Planning: Goal: Ability to manage health-related needs will improve Outcome: Progressing   Problem: Clinical Measurements: Goal: Ability to maintain clinical measurements within normal limits will improve Outcome: Progressing Goal: Will remain free from infection Outcome: Progressing Goal: Diagnostic test results will improve Outcome: Progressing Goal: Respiratory complications will improve Outcome: Progressing Goal: Cardiovascular complication will be avoided Outcome: Progressing   Problem: Activity: Goal: Risk for activity intolerance will decrease Outcome: Progressing   Problem: Nutrition: Goal: Adequate nutrition will be maintained Outcome: Progressing   Problem: Coping: Goal: Level of anxiety will decrease Outcome: Progressing   Problem: Elimination: Goal: Will not experience complications related to bowel motility Outcome: Progressing Goal: Will not experience complications related to urinary retention Outcome: Progressing   Problem: Pain Managment: Goal: General experience of comfort will improve and/or be controlled Outcome: Progressing   Problem: Safety: Goal: Ability to remain free from injury will improve Outcome: Progressing   Problem: Skin Integrity: Goal: Risk for impaired skin integrity will decrease Outcome: Progressing   Problem: Education: Goal: Ability to describe self-care measures that may prevent or decrease complications (Diabetes Survival Skills Education) will improve Outcome: Progressing   Problem: Coping: Goal: Ability to adjust to condition or change in health will improve Outcome: Progressing   Problem: Fluid Volume: Goal: Ability to maintain a balanced intake and output will  improve Outcome: Progressing   Problem: Health Behavior/Discharge Planning: Goal: Ability to identify and utilize available resources and services will improve Outcome: Progressing Goal: Ability to manage health-related needs will improve Outcome: Progressing   Problem: Metabolic: Goal: Ability to maintain appropriate glucose levels will improve Outcome: Progressing   Problem: Nutritional: Goal: Maintenance of adequate nutrition will improve Outcome: Progressing Goal: Progress toward achieving an optimal weight will improve Outcome: Progressing   Problem: Skin Integrity: Goal: Risk for impaired skin integrity will decrease Outcome: Progressing   Problem: Tissue Perfusion: Goal: Adequacy of tissue perfusion will improve Outcome: Progressing   Problem: Education: Goal: Ability to describe self-care measures that may prevent or decrease complications (Diabetes Survival Skills Education) will improve Outcome: Progressing   Problem: Cardiac: Goal: Ability to maintain an adequate cardiac output will improve Outcome: Progressing   Problem: Health Behavior/Discharge Planning: Goal: Ability to identify and utilize available resources and services will improve Outcome: Progressing Goal: Ability to manage health-related needs will improve Outcome: Progressing   Problem: Fluid Volume: Goal: Ability to achieve a balanced intake and output will improve Outcome: Progressing   Problem: Metabolic: Goal: Ability to maintain appropriate glucose levels will improve Outcome: Progressing   Problem: Nutritional: Goal: Maintenance of adequate nutrition will improve Outcome: Progressing Goal: Maintenance of adequate weight for body size and type will improve Outcome: Progressing   Problem: Respiratory: Goal: Will regain and/or maintain adequate ventilation Outcome: Progressing   Problem: Urinary Elimination: Goal: Ability to achieve and maintain adequate renal perfusion and  functioning will improve Outcome: Progressing

## 2024-01-06 NOTE — TOC CM/SW Note (Signed)
 Transition of Care [facility]) - Inpatient Brief Assessment   Patient Details  Name: Jill Crosby MRN: [id number] Date of Birth: 1961-08-07  Transition of Care [facility]) CM/SW Contact:    Lauraine JAYSON Carpen, LCSW Phone Number: 01/06/2024, 2:20 PM   Clinical Narrative: CSW reviewed chart. Patient is currently on acute oxygen. Will follow for this potential discharge need. No other TOC needs identified so far. Please place [facility] consult if any needs arise.  Transition of Care Asessment: Insurance and Status: Insurance coverage has been reviewed Patient has primary care physician: Yes Home environment has been reviewed: Single family home Prior level of function:: Not documented Prior/Current Home Services: No current home services Social Drivers of Health Review: SDOH reviewed no interventions necessary Readmission risk has been reviewed: Yes Transition of care needs: transition of care needs identified, TOC will continue to follow

## 2024-01-06 NOTE — Evaluation (Signed)
 Physical Therapy Evaluation Patient Details Name: Jill Crosby MRN: [id number] DOB: [birth year] Today's Date: 01/06/2024  History of Present Illness  Pt is a 63 y.o. female presenting with severe hyponatremia and altered mental status. Patient is flu positive, extubated 2/4. PMH significant for T1DM, obesity, HTN.   Clinical Impression  Pt alert, agreeable to PT. Did have to wake her up a few times but with mobility improved alertness. Pt stated at baseline she is independent, works full time remotely and lives with her sons. Noted for BM, able to roll L and R for pericare/BM, minA. Supine to sit with CGA, steadying assist. Able to sit <> Stand from several surfaces, minA with RW. Able to step pivot to recliner in room at end of session.  Overall the patient demonstrated deficits (see PT Problem List) that impede the patient's functional abilities, safety, and mobility and would benefit from skilled PT intervention.          If plan is discharge home, recommend the following: A little help with walking and/or transfers;A little help with bathing/dressing/bathroom;Assistance with cooking/housework;Assist for transportation;Help with stairs or ramp for entrance   Can travel by private vehicle   No    Equipment Recommendations Other (comment) (TBD)  Recommendations for Other Services       Functional Status Assessment Patient has had a recent decline in their functional status and demonstrates the ability to make significant improvements in function in a reasonable and predictable amount of time.     Precautions / Restrictions Precautions Precautions: Fall Restrictions Weight Bearing Restrictions Per Provider Order: No      Mobility  Bed Mobility Overal bed mobility: Needs Assistance Bed Mobility: Supine to Sit     Supine to sit: Contact guard, HOB elevated, Used rails     General bed mobility comments: extra time, steadying assist    Transfers Overall transfer level:  Needs assistance Equipment used: Rolling walker (2 wheels) Transfers: Sit to/from Stand, Bed to chair/wheelchair/BSC Sit to Stand: Min assist Stand pivot transfers: Min assist Step pivot transfers: Min assist            Ambulation/Gait                  Stairs            Wheelchair Mobility     Tilt Bed    Modified Rankin (Stroke Patients Only)       Balance Overall balance assessment: Needs assistance Sitting-balance support: Feet supported Sitting balance-Leahy Scale: Fair     Standing balance support: Reliant on assistive device for balance, During functional activity Standing balance-Leahy Scale: Fair                               Pertinent Vitals/Pain Pain Assessment Pain Assessment: No/denies pain    Home Living Family/patient expects to be discharged to:: Private residence   Available Help at Discharge: Family Type of Home: House Home Access: Level entry       Home Layout: One level Home Equipment: [place] - single point      Prior Function Prior Level of Function : Independent/Modified Independent                     Extremity/Trunk Assessment   Upper Extremity Assessment Upper Extremity Assessment: Generalized weakness    Lower Extremity Assessment Lower Extremity Assessment: Generalized weakness (moved symmetrically)       Communication  Cognition Arousal: Alert Behavior During Therapy: WFL for tasks assessed/performed Overall Cognitive Status: Within Functional Limits for tasks assessed                                          General Comments      Exercises     Assessment/Plan    PT Assessment Patient needs continued PT services  PT Problem List Decreased strength;Decreased range of motion;Decreased activity tolerance;Decreased balance;Decreased mobility;Decreased knowledge of use of DME       PT Treatment Interventions DME instruction;Balance training;Gait  training;Neuromuscular re-education;Stair training;Functional mobility training;Patient/family education;Therapeutic activities;Therapeutic exercise    PT Goals (Current goals can be found in the Care Plan section)  Acute Rehab PT Goals Patient Stated Goal: to go home PT Goal Formulation: With patient Time For Goal Achievement: 01/20/24 Potential to Achieve Goals: Good    Frequency Min 1X/week     Co-evaluation               AM-PAC PT 6 Clicks Mobility  Outcome Measure Help needed turning from your back to your side while in a flat bed without using bedrails?: A Little Help needed moving from lying on your back to sitting on the side of a flat bed without using bedrails?: A Little Help needed moving to and from a bed to a chair (including a wheelchair)?: A Little Help needed standing up from a chair using your arms (e.g., wheelchair or bedside chair)?: A Little Help needed to walk in hospital room?: A Lot Help needed climbing 3-5 steps with a railing? : A Lot 6 Click Score: 16    End of Session   Activity Tolerance: Patient tolerated treatment well Patient left: in chair;with call bell/phone within reach Nurse Communication: Mobility status PT Visit Diagnosis: Other abnormalities of gait and mobility (R26.89);Difficulty in walking, not elsewhere classified (R26.2);Muscle weakness (generalized) (M62.81)    Time: 8586-8553 PT Time Calculation (min) (ACUTE ONLY): 33 min   Charges:   PT Evaluation $PT Eval Low Complexity: 1 Low PT Treatments $Therapeutic Activity: 23-37 mins PT General Charges $$ ACUTE PT VISIT: 1 Visit        Doyal Shams PT, DPT 3:46 PM,01/06/24

## 2024-01-06 NOTE — Consult Note (Signed)
 PHARMACY CONSULT NOTE - ELECTROLYTES  Pharmacy Consult for Electrolyte Monitoring and Replacement   Recent Labs: Potassium (mmol/L)  Date Value  01/06/2024 3.5   Magnesium  (mg/dL)  Date Value  97/94/7974 2.2   Calcium  (mg/dL)  Date Value  97/94/7974 8.3 (L)   Albumin (g/dL)  Date Value  97/94/7974 2.5 (L)  03/24/2023 4.2   Phosphorus (mg/dL)  Date Value  97/94/7974 2.3 (L)   Sodium (mmol/L)  Date Value  01/06/2024 126 (L)  04/29/2023 136    Height: 5' 4 (162.6 cm) Weight: 119.8 kg (264 lb 1.8 oz) IBW/kg (Calculated) : 54.7 Estimated Creatinine Clearance: 90.5 mL/min (by C-G formula based on SCr of 0.63 mg/dL).  Assessment  Jill Crosby is a 63 y.o. female presenting with severe hyponatremia and altered mental status. Patient is flu positive. PMH significant for T1DM, obesity, HTN. Pharmacy has been consulted to monitor and replace electrolytes.  Extubated 2/4  Diet: Dysphagia 3 MIVF: Hypertonic saline to cautiously increase Na by ~ 8 mmol/L/day (has been running on/off) Pertinent medications: N/A  Goal of Therapy: Electrolytes within normal limits  Plan:  Na 126, stopping 3% NaCl at this time. Give small bolus of D5w 250 cc Phos 2.3, Phos-Nak 2 packets PO x 1 dose Continue sodium checks q4h for now. Re-check BMP, Mg, Phos tomorrow AM  Thank you for allowing pharmacy to be a part of this patient's care.  Lana Flaim B Marvie Calender 01/06/2024 12:45 PM

## 2024-01-06 NOTE — Evaluation (Signed)
 Clinical/Bedside Swallow Evaluation Patient Details  Name: Jill Crosby MRN: [id number] Date of Birth: 12-Feb-1961  Today's Date: 01/06/2024 Time: SLP Start Time (ACUTE ONLY): 1000 SLP Stop Time (ACUTE ONLY): 1100 SLP Time Calculation (min) (ACUTE ONLY): 60 min  Past Medical History:  Past Medical History:  Diagnosis Date   Anxiety    Depression    Elevated BP    Hypercholesterolemia    Increased BMI    Menopause    Morbid obesity (HCC)    Type I diabetes mellitus (HCC)    Past Surgical History:  Past Surgical History:  Procedure Laterality Date   BREAST BIOPSY Left 05/09/2020   ribbon clip, stereo bx, HEMANGIOMA, BENIGN. - MAMMARY EPITHELIUM IS NOT PRESENT.   CATARACT EXTRACTION W/PHACO Left 11/08/2020   Procedure: CATARACT EXTRACTION PHACO AND INTRAOCULAR LENS PLACEMENT (IOC) LEFT DIABETIC 4.14 00:52.1 ;  Surgeon: Jaye Fallow, MD;  Location: [facility];  Service: Ophthalmology;  Laterality: Left;   CATARACT EXTRACTION W/PHACO Right 12/04/2020   Procedure: CATARACT EXTRACTION PHACO AND INTRAOCULAR LENS PLACEMENT (IOC) RIGHT DIABETIC 6.11 00:46.1;  Surgeon: Jaye Fallow, MD;  Location: [facility];  Service: Ophthalmology;  Laterality: Right;  Diabetic - insulin    CESAREAN SECTION  1997   COLONOSCOPY WITH PROPOFOL  N/A 01/18/2021   Procedure: COLONOSCOPY WITH PROPOFOL ;  Surgeon: Jinny Carmine, MD;  Location: [facility];  Service: Endoscopy;  Laterality: N/A;  priority 4   HPI:  Pt is a 63 year old female with a history of type 1 diabetes since age 24, previously on insulin  pump which was switched to subcutaneous injections years ago.  She had an URTI recently with respiratory symptoms and worsening encephalopathy prompting presentation to the hospital.   PMH includes essential hypertension, dyslipidemia, anxiety disorder, history of smoking, arthritis, Morbid Obesity and glaucoma. She presents with N/V and altered mental status after coming down  with a cold over a week ago. She tested positive for influenza A in the ED. BMP showed hyperglycemia at 300 and severe hyponatremia and undetectably low levels less than 102 with hypochloremia.  She was encephalopathic and agitated and was intubated in the ED.  She was extubated ~2 days later w/out incident.   CXR at admit: patchy airspace opacities throughout both lungs, which  could represent multifocal pneumonia or edema.    Assessment / Plan / Recommendation  Clinical Impression   Pt seen for BSE today. Pt awake, verbal and followed commands given cue. She engaged in general conversation. Family present in room. Pt eager to have something to drink/eat she said.  On [place] O2 4-5L; afebrile. WBC trending down.  Pt appears to present w/ functional oropharyngeal phase swallowing w/ No overt oropharyngeal phase dysphagia noted, No neuromuscular deficits noted. Pt consumed po trials w/ No overt, clinical s/s of aspiration during the po trials.  Pt appears at reduced risk for aspiration following general aspiration precautions w/ easy to eat foods(less exerting foods). However, pt does have challenging factors that could impact her oropharyngeal swallowing to include deconditioning/weakness and acute illness/hospitalization. These factors can increase risk for dysphagia as well as decreased oral intake overall.   During po trials, pt consumed all consistencies w/ no overt coughing, decline in vocal quality, or change in respiratory presentation during/post trials. O2 sats 94-06% during. Oral phase appeared [facility] w/ timely bolus management, mastication, and control of bolus propulsion for A-P transfer for swallowing. Oral clearing achieved w/ all trial consistencies -- moistened, soft foods given.  OM Exam appeared [facility] w/ no  unilateral weakness noted. Speech Clear. Pt fed self w/ setup support.  Recommend a more Mech Soft/Regular consistency diet w/ well-Cut meats, moistened foods for ease of eating/chewing as  well as cohesion at this time(pt should be able to resume her previous diet consistency post return home); Thin liquids -- monitor straw use and sip size for small sips. Recommend general aspiration precautions, reduce distractions during meal - less talking when eating/drinking. Rest Breaks as needed for conservation of energy. Pills WHOLE in Puree for safer, easier swallowing -- it was encourged now and for D/C to the pt and Family.  Education given on Pills in Puree; food consistencies and easy to eat options; general aspiration precautions to pt and Family. Pt appears at her swallowing Baseline. No further skilled ST services indicated currently. MD/NSG to reconsult if any new needs arise. NSG updated, agreed. MD updated. Recommend Dietician f/u for support. SLP Visit Diagnosis: Dysphagia, unspecified (R13.10)    Aspiration Risk   (reduced following general aspiration precautions)    Diet Recommendation   Thin;Dysphagia 3 (mechanical soft) (for ease) = a more Mech Soft/Regular consistency diet w/ well-Cut meats, moistened foods for ease of eating/chewing as well as cohesion; Thin liquids -- monitor straw use and sip size for small sips. Recommend general aspiration precautions, reduce distractions during meal - less talking when eating/drinking. Rest Breaks as needed for conservation of energy.   Medication Administration: Whole meds with puree    Other  Recommendations Recommended Consults:  (Dietician f/u) Oral Care Recommendations: Oral care BID;Patient independent with oral care (setup)    Recommendations for follow up therapy are one component of a multi-disciplinary discharge planning process, led by the attending physician.  Recommendations may be updated based on patient status, additional functional criteria and insurance authorization.  Follow up Recommendations No SLP follow up      Assistance Recommended at Discharge  PRN-intermittent d/t weakness  Functional Status Assessment  Patient has had a recent decline in their functional status and demonstrates the ability to make significant improvements in function in a reasonable and predictable amount of time.  Frequency and Duration  (n/a)   (n/a)       Prognosis Prognosis for improved oropharyngeal function: Good Barriers to Reach Goals:  (none) Barriers/Prognosis Comment: deconditioning in setting of illness      Swallow Study   General Date of Onset: 01/03/24 HPI: Pt is a 63 year old female with a history of type 1 diabetes since age 33, previously on insulin  pump which was switched to subcutaneous injections years ago.  She had an URTI recently with respiratory symptoms and worsening encephalopathy prompting presentation to the hospital.   PMH includes essential hypertension, dyslipidemia, anxiety disorder, history of smoking, arthritis, Morbid Obesity and glaucoma. She presents with N/V and altered mental status after coming down with a cold over a week ago. She tested positive for influenza A in the ED. BMP showed hyperglycemia at 300 and severe hyponatremia and undetectably low levels less than 102 with hypochloremia.  She was encephalopathic and agitated and was intubated in the ED.  She was extubated ~2 days later w/out incident.   CXR at admit: patchy airspace opacities throughout both lungs, which  could represent multifocal pneumonia or edema. Type of Study: Bedside Swallow Evaluation Previous Swallow Assessment: none Diet Prior to this Study: NPO (regular diet at home) Temperature Spikes Noted: No (wbc trending down) Respiratory Status: Nasal cannula (4-5L) History of Recent Intubation: Yes Total duration of intubation (days): 2 days Date extubated:  01/05/24 Behavior/Cognition: Alert;Cooperative;Pleasant mood Oral Cavity Assessment: Within Functional Limits Oral Care Completed by SLP: Yes Oral Cavity - Dentition: Adequate natural dentition (few missing molars) Vision: Functional for  self-feeding Self-Feeding Abilities: Able to feed self;Needs assist;Needs set up Patient Positioning: Upright in bed (supported) Baseline Vocal Quality: Normal Volitional Cough: Strong Volitional Swallow: Able to elicit    Oral/Motor/Sensory Function Overall Oral Motor/Sensory Function: Within functional limits   Ice Chips Ice chips: Within functional limits Presentation: Spoon (fed; 5 trials)   Thin Liquid Thin Liquid: Within functional limits Presentation: Cup;Self Fed;Straw (~4 ozs total)    Nectar Thick Nectar Thick Liquid: Not tested   Honey Thick Honey Thick Liquid: Not tested   Puree Puree: Within functional limits Presentation: Spoon (fed; 10 trials)   Solid     Solid: Within functional limits (moistened well) Presentation: Spoon (fed; 7-8 trials) Other Comments: small pieces        Comer Portugal, MS, CCC-SLP Speech Language Pathologist Rehab Services; [facility] [place] [phone] (ascom) Dorotea Hand 01/06/2024,5:37 PM

## 2024-01-06 NOTE — Progress Notes (Signed)
 [facility]  ROUNDING NOTE   Subjective:   Patient off of BiPAP. Serum sodium continues to progress in a positive direction. Awake and alert.  Objective:  Vital signs in last 24 hours:  Temp:  [97.7 F (36.5 C)-98.8 F (37.1 C)] 98.3 F (36.8 C) (02/05 1500) Pulse Rate:  [70-103] 86 (02/05 1630) Resp:  [10-22] 13 (02/05 1630) BP: (128-165)/(45-123) 133/47 (02/05 1630) SpO2:  [87 %-97 %] 96 % (02/05 1630) FiO2 (%):  [40 %] 40 % (02/05 0400) Weight:  [119.8 kg] 119.8 kg (02/05 0500)  Weight change: 0 kg Filed Weights   01/04/24 0500 01/05/24 0330 01/06/24 0500  Weight: 120.1 kg 119.8 kg 119.8 kg    Intake/Output: I/O last 3 completed shifts: In: 875.6 [I.V.:525.6; IV Piggyback:350] Out: 3500 [Urine:3500]   Intake/Output this shift:  Total I/O In: 1377.3 [I.V.:423.2; IV Piggyback:954.1] Out: 2300 [Urine:2300]  Physical Exam: General: No acute distress  Head: Normocephalic, atraumatic. Moist oral mucosal membranes  Neck: Supple  Lungs:  Scattered rhonchi, normal effort  Heart: S1S2 no rubs  Abdomen:  Soft, nontender, bowel sounds present  Extremities: Trace peripheral edema.  Neurologic: Arousable  Skin: No acute rash  Access: No hemodialysis access    Basic Metabolic Panel: Recent Labs  Lab 01/03/24 1633 01/03/24 1735 01/03/24 1840 01/03/24 2036 01/04/24 0106 01/04/24 0413 01/04/24 0532 01/05/24 0505 01/05/24 0935 01/06/24 0232 01/06/24 0528 01/06/24 0645 01/06/24 1010 01/06/24 1430  NA <102*   < >  --  103*   < > 108*   < > 110*   < > 119* 120* 122* 126* 127*  K 3.2*  --   --   --   --  3.8  --  4.7  --   --  3.5  --   --   --   CL <65*  --   --   --   --  74*  --  77*  --   --  84*  --   --   --   CO2 24  --   --   --   --  27  --  26  --   --  29  --   --   --   GLUCOSE 324*  --   --   --   --  190*  --  260*  --   --  94  --   --   --   BUN 10  --   --   --   --  11  --  14  --   --  10  --   --   --   CREATININE 0.63  --  0.64   --   --  0.80  --  0.77  --   --  0.63  --   --   --   CALCIUM  8.0*  --   --   --   --  7.8*  --  8.1*  --   --  8.3*  --   --   --   MG  --   --   --  2.2  --  2.1  --  2.3  --   --  2.2  --   --   --   PHOS  --   --   --   --   --  3.3  --  2.2*  --   --  2.3*  --   --   --    < > =  values in this interval not displayed.    Liver Function Tests: Recent Labs  Lab 01/03/24 1633 01/04/24 0413 01/05/24 0505 01/06/24 0528  AST 90* 50* 35 28  ALT 56* 41 40 33  ALKPHOS 56 49 45 49  BILITOT 1.4* 0.8 0.7 0.9  PROT 6.8 5.7* 5.7* 5.7*  ALBUMIN 3.4* 2.6* 2.5* 2.5*   Recent Labs  Lab 01/03/24 1840  LIPASE 26   No results for input(s): AMMONIA in the last 168 hours.  CBC: Recent Labs  Lab 01/03/24 1633 01/03/24 2036 01/04/24 0413 01/05/24 0505 01/06/24 0528  WBC 23.1* 22.7* 21.3* 30.0* 21.2*  NEUTROABS 20.6*  --   --   --   --   HGB 12.0 12.4 10.9* 10.3* 10.4*  HCT RESULTS UNAVAILABLE DUE TO INTERFERING SUBSTANCE RESULTS UNAVAILABLE DUE TO INTERFERING SUBSTANCE 29.2* 28.2* 28.7*  MCV RESULTS UNAVAILABLE DUE TO INTERFERING SUBSTANCE RESULTS UNAVAILABLE DUE TO INTERFERING SUBSTANCE 79.6* 82.0 82.9  PLT 323 291 299 340 367    Cardiac Enzymes: No results for input(s): CKTOTAL, CKMB, CKMBINDEX, TROPONINI in the last 168 hours.  BNP: Invalid input(s): POCBNP  CBG: Recent Labs  Lab 01/06/24 0348 01/06/24 0425 01/06/24 0732 01/06/24 1126 01/06/24 1537  GLUCAP 65* 116* 98 119* 229*    Microbiology: Results for orders placed or performed during the hospital encounter of 01/03/24  Resp panel by RT-PCR (RSV, Flu A&B, Covid) Anterior Nasal Swab     Status: Abnormal   Collection Time: 01/03/24  2:52 PM   Specimen: Anterior Nasal Swab  Result Value Ref Range Status   SARS Coronavirus 2 by RT PCR NEGATIVE NEGATIVE Final    Comment: (NOTE) SARS-CoV-2 target nucleic acids are NOT DETECTED.  The SARS-CoV-2 RNA is generally detectable in upper respiratory specimens  during the acute phase of infection. The lowest concentration of SARS-CoV-2 viral copies this assay can detect is 138 copies/mL. A negative result does not preclude SARS-Cov-2 infection and should not be used as the sole basis for treatment or other patient management decisions. A negative result may occur with  improper specimen collection/handling, submission of specimen other than nasopharyngeal swab, presence of viral mutation(s) within the areas targeted by this assay, and inadequate number of viral copies(<138 copies/mL). A negative result must be combined with clinical observations, patient history, and epidemiological information. The expected result is Negative.  Fact Sheet for Patients:  bloggercourse.com  Fact Sheet for Healthcare Providers:  seriousbroker.it  This test is no t yet approved or cleared by the [place]  FDA and  has been authorized for detection and/or diagnosis of SARS-CoV-2 by FDA under an Emergency Use Authorization (EUA). This EUA will remain  in effect (meaning this test can be used) for the duration of the COVID-19 declaration under Section 564(b)(1) of the Act, 21 U.S.C.section 360bbb-3(b)(1), unless the authorization is terminated  or revoked sooner.       Influenza A by PCR POSITIVE (A) NEGATIVE Final   Influenza B by PCR NEGATIVE NEGATIVE Final    Comment: (NOTE) The Xpert Xpress SARS-CoV-2/FLU/RSV plus assay is intended as an aid in the diagnosis of influenza from Nasopharyngeal swab specimens and should not be used as a sole basis for treatment. Nasal washings and aspirates are unacceptable for Xpert Xpress SARS-CoV-2/FLU/RSV testing.  Fact Sheet for Patients: bloggercourse.com  Fact Sheet for Healthcare Providers: seriousbroker.it  This test is not yet approved or cleared by the [place]  FDA and has been authorized for detection  and/or diagnosis of SARS-CoV-2 by FDA under  an Emergency Use Authorization (EUA). This EUA will remain in effect (meaning this test can be used) for the duration of the COVID-19 declaration under Section 564(b)(1) of the Act, 21 U.S.C. section 360bbb-3(b)(1), unless the authorization is terminated or revoked.     Resp Syncytial Virus by PCR NEGATIVE NEGATIVE Final    Comment: (NOTE) Fact Sheet for Patients: bloggercourse.com  Fact Sheet for Healthcare Providers: seriousbroker.it  This test is not yet approved or cleared by the [place]  FDA and has been authorized for detection and/or diagnosis of SARS-CoV-2 by FDA under an Emergency Use Authorization (EUA). This EUA will remain in effect (meaning this test can be used) for the duration of the COVID-19 declaration under Section 564(b)(1) of the Act, 21 U.S.C. section 360bbb-3(b)(1), unless the authorization is terminated or revoked.  Performed at [facility], [address] [place], [place] [address]   Blood culture (routine x 2)     Status: None (Preliminary result)   Collection Time: 01/03/24  5:43 PM   Specimen: BLOOD LEFT HAND  Result Value Ref Range Status   Specimen Description BLOOD LEFT HAND  Final   Special Requests   Final    BOTTLES DRAWN AEROBIC AND ANAEROBIC Blood Culture results may not be optimal due to an inadequate volume of blood received in culture bottles   Culture   Final    NO GROWTH 3 DAYS Performed at [facility], [address] [place], [place] [address]    Report Status PENDING  Incomplete  Blood culture (routine x 2)     Status: None (Preliminary result)   Collection Time: 01/03/24  6:40 PM   Specimen: BLOOD RIGHT HAND  Result Value Ref Range Status   Specimen Description BLOOD RIGHT HAND  Final   Special Requests   Final    BOTTLES DRAWN AEROBIC AND ANAEROBIC Blood Culture results may not be optimal due to an  inadequate volume of blood received in culture bottles   Culture   Final    NO GROWTH 3 DAYS Performed at [facility], [address], [place], [place] [address]    Report Status PENDING  Incomplete  Culture, blood (Routine X 2) w Reflex to ID Panel     Status: None (Preliminary result)   Collection Time: 01/03/24  8:36 PM   Specimen: BLOOD  Result Value Ref Range Status   Specimen Description BLOOD RIGHT HAND  Final   Special Requests   Final    BOTTLES DRAWN AEROBIC ONLY Blood Culture results may not be optimal due to an inadequate volume of blood received in culture bottles   Culture   Final    NO GROWTH 3 DAYS Performed at [facility], [address], [place], [place] [address]    Report Status PENDING  Incomplete  Culture, blood (Routine X 2) w Reflex to ID Panel     Status: None (Preliminary result)   Collection Time: 01/03/24  8:40 PM   Specimen: BLOOD  Result Value Ref Range Status   Specimen Description BLOOD BLOOD LEFT HAND  Final   Special Requests   Final    BOTTLES DRAWN AEROBIC AND ANAEROBIC Blood Culture results may not be optimal due to an inadequate volume of blood received in culture bottles   Culture   Final    NO GROWTH 3 DAYS Performed at [facility], [address], [place], [place] [address]    Report Status PENDING  Incomplete  MRSA Next Gen by  PCR, Nasal     Status: None   Collection Time: 01/03/24  8:45 PM   Specimen: Nasal Mucosa; Nasal Swab  Result Value Ref Range Status   MRSA by PCR Next Gen NOT DETECTED NOT DETECTED Final    Comment: (NOTE) The GeneXpert MRSA Assay (FDA approved for NASAL specimens only), is one component of a comprehensive MRSA colonization surveillance program. It is not intended to diagnose MRSA infection nor to guide or monitor treatment for MRSA infections. Test performance is not FDA approved in patients less than 78 years old. Performed at [facility], [address]  [place], [place] [address]   Respiratory (~20 pathogens) panel by PCR     Status: Abnormal   Collection Time: 01/04/24  5:28 PM   Specimen: Nasopharyngeal Swab; Respiratory  Result Value Ref Range Status   Adenovirus NOT DETECTED NOT DETECTED Final   Coronavirus 229E NOT DETECTED NOT DETECTED Final    Comment: (NOTE) The Coronavirus on the Respiratory Panel, DOES NOT test for the novel  Coronavirus (2019 nCoV)    Coronavirus HKU1 NOT DETECTED NOT DETECTED Final   Coronavirus NL63 NOT DETECTED NOT DETECTED Final   Coronavirus OC43 NOT DETECTED NOT DETECTED Final   Metapneumovirus NOT DETECTED NOT DETECTED Final   Rhinovirus / Enterovirus NOT DETECTED NOT DETECTED Final   Influenza A H1 2009 DETECTED (A) NOT DETECTED Final   Influenza B NOT DETECTED NOT DETECTED Final   Parainfluenza Virus 1 NOT DETECTED NOT DETECTED Final   Parainfluenza Virus 2 NOT DETECTED NOT DETECTED Final   Parainfluenza Virus 3 NOT DETECTED NOT DETECTED Final   Parainfluenza Virus 4 NOT DETECTED NOT DETECTED Final   Respiratory Syncytial Virus NOT DETECTED NOT DETECTED Final   Bordetella pertussis NOT DETECTED NOT DETECTED Final   Bordetella Parapertussis NOT DETECTED NOT DETECTED Final   Chlamydophila pneumoniae NOT DETECTED NOT DETECTED Final   Mycoplasma pneumoniae NOT DETECTED NOT DETECTED Final    Comment: Performed at [facility], [address] [place], [place] [address]    Coagulation Studies: No results for input(s): LABPROT, INR in the last 72 hours.  Urinalysis: Recent Labs    01/03/24 2204  COLORURINE STRAW*  LABSPEC 1.005  PHURINE 6.0  GLUCOSEU >=500*  HGBUR NEGATIVE  BILIRUBINUR NEGATIVE  KETONESUR NEGATIVE  PROTEINUR NEGATIVE  NITRITE NEGATIVE  LEUKOCYTESUR NEGATIVE      Imaging: ECHOCARDIOGRAM COMPLETE Result Date: 01/05/2024    ECHOCARDIOGRAM REPORT   Patient Name:   [facility] Date of Exam: 01/05/2024 Medical Rec #:  [id number]        Height:       64.0 in Accession  #:    [id number]       Weight:       264.1 lb Date of Birth:  01-12-1961        BSA:          2.202 m Patient Age:    62 years         BP:           98/53 mmHg Patient Gender: F                HR:           51 bpm. Exam Location:  [facility] Procedure: 2D Echo, Cardiac Doppler and Color Doppler Indications:     Dyspnea  History:         Patient has no prior history of Echocardiogram examinations.  Signs/Symptoms:Dyspnea and Dizziness/Lightheadedness; Risk                  Factors:Hypertension, Diabetes and Current Smoker.  Sonographer:     Naomie Reef Referring Phys:  [id number] KHABIB DGAYLI Diagnosing Phys: [name] MD  Sonographer Comments: Technically difficult study due to poor echo windows, echo performed with patient supine and on artificial respirator and patient is obese. IMPRESSIONS  1. Left ventricular ejection fraction, by estimation, is 55 to 60%. The left ventricle has normal function. Left ventricular endocardial border not optimally defined to evaluate regional wall motion. The left ventricular internal cavity size was mildly dilated. There is mild left ventricular hypertrophy. Left ventricular diastolic parameters are consistent with Grade II diastolic dysfunction (pseudonormalization).  2. Right ventricular systolic function is normal. The right ventricular size is normal.  3. Left atrial size was mildly dilated.  4. The mitral valve is grossly normal. Mild mitral valve regurgitation. No evidence of mitral stenosis.  5. The aortic valve is tricuspid. Aortic valve regurgitation is not visualized. No aortic stenosis is present. FINDINGS  Left Ventricle: Left ventricular ejection fraction, by estimation, is 55 to 60%. The left ventricle has normal function. Left ventricular endocardial border not optimally defined to evaluate regional wall motion. The left ventricular internal cavity size was mildly dilated. There is mild left ventricular hypertrophy. Left ventricular diastolic  parameters are consistent with Grade II diastolic dysfunction (pseudonormalization). Right Ventricle: The right ventricular size is normal. No increase in right ventricular wall thickness. Right ventricular systolic function is normal. Left Atrium: Left atrial size was mildly dilated. Right Atrium: Right atrial size was normal in size. Pericardium: There is no evidence of pericardial effusion. Mitral Valve: The mitral valve is grossly normal. Mild mitral valve regurgitation. No evidence of mitral valve stenosis. MV peak gradient, 5.2 mmHg. The mean mitral valve gradient is 2.0 mmHg. Tricuspid Valve: The tricuspid valve is not well visualized. Tricuspid valve regurgitation is not demonstrated. Aortic Valve: The aortic valve is tricuspid. There is mild aortic valve annular calcification. Aortic valve regurgitation is not visualized. No aortic stenosis is present. Aortic valve mean gradient measures 8.0 mmHg. Aortic valve peak gradient measures 15.5 mmHg. Aortic valve area, by VTI measures 1.66 cm. Pulmonic Valve: The pulmonic valve was not well visualized. Pulmonic valve regurgitation is not visualized. No evidence of pulmonic stenosis. Aorta: The aortic root is normal in size and structure. Venous: IVC assessment for right atrial pressure unable to be performed due to mechanical ventilation. IAS/Shunts: The interatrial septum was not well visualized.  LEFT VENTRICLE PLAX 2D LVIDd:         5.30 cm     Diastology LVIDs:         3.00 cm     LV e' medial:    7.51 cm/s LV PW:         1.10 cm     LV E/e' medial:  15.6 LV IVS:        1.00 cm     LV e' lateral:   7.83 cm/s LVOT diam:     2.00 cm     LV E/e' lateral: 14.9 LV SV:         82 LV SV Index:   37 LVOT Area:     3.14 cm  LV Volumes (MOD) LV vol d, MOD A2C: 52.9 ml LV vol d, MOD A4C: 54.9 ml LV vol s, MOD A2C: 19.3 ml LV vol s, MOD A4C: 24.6 ml LV SV MOD A2C:  33.6 ml LV SV MOD A4C:     54.9 ml LV SV MOD BP:      33.2 ml RIGHT VENTRICLE RV Basal diam:  2.90 cm  RV Mid diam:    2.80 cm RV S prime:     9.03 cm/s TAPSE (M-mode): 2.4 cm LEFT ATRIUM             Index        RIGHT ATRIUM           Index LA diam:        4.60 cm 2.09 cm/m   RA Area:     15.30 cm LA Vol (A2C):   76.3 ml 34.65 ml/m  RA Volume:   39.00 ml  17.71 ml/m LA Vol (A4C):   58.9 ml 26.75 ml/m LA Biplane Vol: 71.6 ml 32.52 ml/m  AORTIC VALVE                     PULMONIC VALVE AV Area (Vmax):    1.66 cm      PV Vmax:       0.98 m/s AV Area (Vmean):   1.67 cm      PV Peak grad:  3.8 mmHg AV Area (VTI):     1.66 cm AV Vmax:           197.00 cm/s AV Vmean:          131.000 cm/s AV VTI:            0.491 m AV Peak Grad:      15.5 mmHg AV Mean Grad:      8.0 mmHg LVOT Vmax:         104.00 cm/s LVOT Vmean:        69.600 cm/s LVOT VTI:          0.260 m LVOT/AV VTI ratio: 0.53  AORTA Ao Root diam: 3.10 cm MITRAL VALVE MV Area (PHT): 3.02 cm     SHUNTS MV Area VTI:   1.68 cm     Systemic VTI:  0.26 m MV Peak grad:  5.2 mmHg     Systemic Diam: 2.00 cm MV Mean grad:  2.0 mmHg MV Vmax:       1.14 m/s MV Vmean:      57.9 cm/s MV Decel Time: 251 msec MV E velocity: 117.00 cm/s MV A velocity: 87.40 cm/s MV E/A ratio:  1.34 Lonni End MD Electronically signed by [name] MD Signature Date/Time: 01/05/2024/10:09:06 AM    Final      Medications:    azithromycin  Stopped (01/06/24 9167)   cefTRIAXone  (ROCEPHIN )  IV Stopped (01/06/24 0723)    Chlorhexidine  Gluconate Cloth  6 each Topical Daily   enoxaparin  (LOVENOX ) injection  60 mg Subcutaneous Q24H   insulin  aspart  0-6 Units Subcutaneous Q4H   insulin  glargine-yfgn  10 Units Subcutaneous BID   nicotine   14 mg Transdermal Daily   mouth rinse  15 mL Mouth Rinse 4 times per day   oseltamivir   75 mg Oral BID   sodium chloride  flush  10-40 mL Intracatheter Q12H   dextrose , hydrALAZINE , mouth rinse, polyethylene glycol  Assessment/ Plan:  63 y.o. female with past medical history of diabetes mellitus type 1, hypertension, congestive heart  failure, obesity, depression, hyperlipidemia who presented to emergency department with cough, shortness of breath, decreased p.o. intake as well as nausea and vomiting.  Found to be influenza A positive with respiratory distress and initially required mechanical ventilation.  1.  Hyponatremia.  Appears to be multifactorial.  Patient was on HCTZ as well as SSRIs at home.   Update: Sodium continues to improve steadily and now up to 127.  Mental status significantly improved as well.  3% saline placed on hold at the moment.  2.  Acute respiratory failure.  Patient extubated and off BiPAP.  Breathing comfortably.     LOS: 3 Alverto Shedd 2/5/20255:30 PM

## 2024-01-06 NOTE — Progress Notes (Signed)
 NAME:  Jill Crosby, MRN:  [id number], DOB:  03/24/1961, LOS: 3 ADMISSION DATE:  01/03/2024, CHIEF COMPLAINT:  Hyponatremia, Encephalopathy   History of Present Illness:   This is 63 year old female with a history of type 1 diabetes since age 26, previously on insulin  pump which was switched to subcutaneous injections years ago.  She had an URTI recently with respiratory symptoms and worsening encephalopathy prompting presentation to the hospital.  History was obtained from family members who provided collateral information given intubation and mechanical ventilation. Additional background medical history includes essential hypertension, dyslipidemia, anxiety disorder, history of smoking, arthritis, morbid obesity and glaucoma. She presents with N/V and altered mental status after coming down with a cold over a week ago. She tested positive for influenza A in the ED. BMP showed hyperglycemia at 300 and severe hyponatremia and undetectably low levels less than 102 with hypochloremia.  She was encephalopathic and agitated and was intubated in the ED. PCCM was consulted for admission for management of encephalopathy, respiratory failure, and hyponatremia.  Pertinent  Medical History  -T1DM -HTN -HLD -Obesity  Significant Hospital Events: Including procedures, antibiotic start and stop dates in addition to other pertinent events   01/03/2024: admit to hospital, intubated, severe hyponatremia 01/04/2024: remains intubated, follows commands. Sodium 109-111 01/05/2024: passed SBT, followed commands, sodium improved, extubated to BiPAP 01/06/2024: awake and interactive on nasal cannula. Sodium improving on hypertonic saline  Interim History / Subjective:  Awake and interactive, denies pain or shortness of breath. Remains sleepy.  Objective   Blood pressure (!) 164/55, pulse 94, temperature 98.2 F (36.8 C), resp. rate 18, height 5' 4 (1.626 m), weight 119.8 kg, SpO2 92%.    FiO2 (%):  [40 %] 40 %    Intake/Output Summary (Last 24 hours) at 01/06/2024 1545 Last data filed at 01/06/2024 1354 Gross per 24 hour  Intake 1196.27 ml  Output 4100 ml  Net -2903.73 ml   Filed Weights   01/04/24 0500 01/05/24 0330 01/06/24 0500  Weight: 120.1 kg 119.8 kg 119.8 kg    Examination: Physical Exam Constitutional:      General: She is not in acute distress.    Appearance: She is obese. She is not ill-appearing.  Cardiovascular:     Rate and Rhythm: Normal rate and regular rhythm.     Heart sounds: Normal heart sounds.  Pulmonary:     Breath sounds: No wheezing or rales.  Neurological:     Mental Status: She is oriented to person, place, and time. Mental status is at baseline.     Motor: Weakness present.      Assessment & Plan:   #Acute Hypoxic Respiratory Failure #Severe Symptomatic Hyponatremia #Toxic Metabolic Encephalopathy #Severe Hyperglycemia without DKA #Influenza A infection #Acute on Chronic HFpEF  63 year old female with a history of T1DM presents with a few days history of URTI found to have influenza pneumonia and toxic metabolic encephalopathy secondary to severe hyponatremia. This is likely to osmotic diuresis, hydrochlorothiazide  use, SSRI use, and decreased PO intake. She was intubated for respiratory failure and encephalopathy and now extubated with improvement in respiratory status and mental status.  Neuro - discontinued analgosedation and psychotropic medications. Mental status improved. Holding SSRI given hyponatremia. Sodium improving slowly, but remains at high risk for seizure. CV - required vasopressor support secondary to sedation related hypotension, now discontinued. Lactic acid within normal and is overall well perfused. Elevated BNP with signs of volume overload suggests heart failure, TTE with signs of HFpEF. Did  administer furosemide  yesterday but holding today given rise in sodium.  Pulm - Respiratory failure following influenza infection with chest  xray showing increased interstitial markings. This could represent findings secondary to influenza vs findings of decompensated heart failure given elevated BNP. Extubated to BiPAP after passing SBT and is now on nasal cannula. Will continue with nocturnal BiPAP. GI - NPO for now pending SLP. D/c SUP Renal - severe hyponatremia, initially < 102. Goal of 6 to 8 in 24 hours. Mildly overcorrected yesterday and required D5W and ddavp . This AM sodium is improving and at goal but did over correct in the afternoon. Will administer D5W again alongside DDAVP .  Endo - switch from insulin  gtt to basal bolus given severe hyponatremia and to decrease obligate fluid intake. Holding given she is NPO Hem/Onc - heparin  subQ for DVT prophylaxis ID - influenza infection, re-sent swab per ID's recommendations. Will continue antibiotics for CAP coverage. Continue Tamiflu .  Best Practice (right click and Reselect all SmartList Selections daily)   Diet/type: NPO DVT prophylaxis prophylactic heparin   Pressure ulcer(s): N/A GI prophylaxis: H2B Lines: Central line, Arterial Line, and yes and it is still needed Foley:  Yes, and it is still needed Code Status:  full code Last date of multidisciplinary goals of care discussion [01/06/2024]  Labs   CBC: Recent Labs  Lab 01/03/24 1633 01/03/24 2036 01/04/24 0413 01/05/24 0505 01/06/24 0528  WBC 23.1* 22.7* 21.3* 30.0* 21.2*  NEUTROABS 20.6*  --   --   --   --   HGB 12.0 12.4 10.9* 10.3* 10.4*  HCT RESULTS UNAVAILABLE DUE TO INTERFERING SUBSTANCE RESULTS UNAVAILABLE DUE TO INTERFERING SUBSTANCE 29.2* 28.2* 28.7*  MCV RESULTS UNAVAILABLE DUE TO INTERFERING SUBSTANCE RESULTS UNAVAILABLE DUE TO INTERFERING SUBSTANCE 79.6* 82.0 82.9  PLT 323 291 299 340 367    Basic Metabolic Panel: Recent Labs  Lab 01/03/24 1633 01/03/24 1735 01/03/24 1840 01/03/24 2036 01/04/24 0106 01/04/24 0413 01/04/24 0532 01/05/24 0505 01/05/24 0935 01/06/24 0232 01/06/24 0528  01/06/24 0645 01/06/24 1010 01/06/24 1430  NA <102*   < >  --  103*   < > 108*   < > 110*   < > 119* 120* 122* 126* 127*  K 3.2*  --   --   --   --  3.8  --  4.7  --   --  3.5  --   --   --   CL <65*  --   --   --   --  74*  --  77*  --   --  84*  --   --   --   CO2 24  --   --   --   --  27  --  26  --   --  29  --   --   --   GLUCOSE 324*  --   --   --   --  190*  --  260*  --   --  94  --   --   --   BUN 10  --   --   --   --  11  --  14  --   --  10  --   --   --   CREATININE 0.63  --  0.64  --   --  0.80  --  0.77  --   --  0.63  --   --   --   CALCIUM  8.0*  --   --   --   --  7.8*  --  8.1*  --   --  8.3*  --   --   --   MG  --   --   --  2.2  --  2.1  --  2.3  --   --  2.2  --   --   --   PHOS  --   --   --   --   --  3.3  --  2.2*  --   --  2.3*  --   --   --    < > = values in this interval not displayed.   GFR: Estimated Creatinine Clearance: 90.5 mL/min (by C-G formula based on SCr of 0.63 mg/dL). Recent Labs  Lab 01/03/24 1738 01/03/24 1840 01/03/24 2036 01/03/24 2048 01/04/24 0341 01/04/24 0413 01/05/24 0505 01/06/24 0528  PROCALCITON  --  0.23  --   --   --   --   --   --   WBC  --   --  22.7*  --   --  21.3* 30.0* 21.2*  LATICACIDVEN 1.0  --   --  1.3 1.0  --   --   --     Liver Function Tests: Recent Labs  Lab 01/03/24 1633 01/04/24 0413 01/05/24 0505 01/06/24 0528  AST 90* 50* 35 28  ALT 56* 41 40 33  ALKPHOS 56 49 45 49  BILITOT 1.4* 0.8 0.7 0.9  PROT 6.8 5.7* 5.7* 5.7*  ALBUMIN 3.4* 2.6* 2.5* 2.5*   Recent Labs  Lab 01/03/24 1840  LIPASE 26   No results for input(s): AMMONIA in the last 168 hours.  ABG    Component Value Date/Time   PHART 7.44 01/06/2024 0528   PCO2ART 47 01/06/2024 0528   PO2ART 85 01/06/2024 0528   HCO3 31.9 (H) 01/06/2024 0528   O2SAT 98.6 01/06/2024 0528     Coagulation Profile: No results for input(s): INR, PROTIME in the last 168 hours.  Cardiac Enzymes: No results for input(s): CKTOTAL, CKMB,  CKMBINDEX, TROPONINI in the last 168 hours.  HbA1C: Hemoglobin A1C  Date/Time Value Ref Range Status  07/25/2021 12:00 AM 7.0  Final   Hgb A1c MFr Bld  Date/Time Value Ref Range Status  01/04/2024 01:06 AM 6.9 (H) 4.8 - 5.6 % Final    Comment:    (NOTE) Pre diabetes:          5.7%-6.4%  Diabetes:              >6.4%  Glycemic control for   <7.0% adults with diabetes   03/24/2023 11:07 AM 7.1 (H) 4.8 - 5.6 % Final    Comment:             Prediabetes: 5.7 - 6.4          Diabetes: >6.4          Glycemic control for adults with diabetes: <7.0     CBG: Recent Labs  Lab 01/06/24 0348 01/06/24 0425 01/06/24 0732 01/06/24 1126 01/06/24 1537  GLUCAP 65* 116* 98 119* 229*    Review of Systems:   Unable to obtain  Past Medical History:  She,  has a past medical history of Anxiety, Depression, Elevated BP, Hypercholesterolemia, Increased BMI, Menopause, Morbid obesity (HCC), and Type I diabetes mellitus (HCC).   Surgical History:   Past Surgical History:  Procedure Laterality Date   BREAST BIOPSY Left 05/09/2020   ribbon clip, stereo bx, HEMANGIOMA, BENIGN. - MAMMARY EPITHELIUM IS NOT PRESENT.  CATARACT EXTRACTION W/PHACO Left 11/08/2020   Procedure: CATARACT EXTRACTION PHACO AND INTRAOCULAR LENS PLACEMENT (IOC) LEFT DIABETIC 4.14 00:52.1 ;  Surgeon: Jaye Fallow, MD;  Location: [facility];  Service: Ophthalmology;  Laterality: Left;   CATARACT EXTRACTION W/PHACO Right 12/04/2020   Procedure: CATARACT EXTRACTION PHACO AND INTRAOCULAR LENS PLACEMENT (IOC) RIGHT DIABETIC 6.11 00:46.1;  Surgeon: Jaye Fallow, MD;  Location: [facility];  Service: Ophthalmology;  Laterality: Right;  Diabetic - insulin    CESAREAN SECTION  1997   COLONOSCOPY WITH PROPOFOL  N/A 01/18/2021   Procedure: COLONOSCOPY WITH PROPOFOL ;  Surgeon: Jinny Carmine, MD;  Location: [facility];  Service: Endoscopy;  Laterality: N/A;  priority 4     Social History:    reports that she has quit smoking. Her smoking use included cigarettes. She has a 10 pack-year smoking history. She has never used smokeless tobacco. She reports that she does not drink alcohol and does not use drugs.   Family History:  Her family history includes Breast cancer in her maternal aunt and maternal grandmother; Cancer in her father; Colon cancer in her maternal grandfather and paternal grandfather; Colon polyps in her mother; Diabetes in her maternal grandfather; Hyperlipidemia in her mother; Hypertension in her mother. There is no history of Heart disease or Ovarian cancer.   Allergies Allergies  Allergen Reactions   Codeine Nausea And Vomiting     Home Medications  Prior to Admission medications   Medication Sig Start Date End Date Taking? Authorizing Provider  meloxicam (MOBIC) 7.5 MG tablet Take 7.5 mg by mouth daily. 09/07/23  Yes [provider]  amLODipine  (NORVASC ) 10 MG tablet TAKE 1 TABLET BY MOUTH DAILY 08/05/23   Glendia Shad, MD  BAYER MICROLET LANCETS lancets USE AS DIRECTED TO CHECK  SUGARS 1 TO 2 TIMES A DAY 01/15/18   Glendia Shad, MD  cholecalciferol (VITAMIN D3) 25 MCG (1000 UT) tablet Take 1,000 Units by mouth daily.    [provider]  CONTOUR NEXT TEST test strip USE TO CHECK BLOOD GLUCOSE 6 TO  7 TIMES DAILY 07/10/23   Glendia Shad, MD  Cyanocobalamin  (B-12 PO) Take by mouth.    [provider]  hydrochlorothiazide  (HYDRODIURIL ) 25 MG tablet TAKE 1 TABLET BY MOUTH DAILY 03/03/23   Glendia Shad, MD  insulin  glargine (LANTUS  SOLOSTAR) 100 UNIT/ML Solostar Pen INJECT SUBCUTANEOUSLY 80 UNITS  AT NIGHT 12/11/23   Glendia Shad, MD  insulin  lispro (HUMALOG  KWIKPEN) 100 UNIT/ML KwikPen INJECT SUBCUTANEOUSLY 65  UNITS DAILY.  (Takes distributed over three meals counting carbs) 07/15/23   Glendia Shad, MD  Insulin  Pen Needle 32G X 4 MM MISC Use as directed with insulin  4 x daily. 07/15/23   Glendia Shad, MD  levocetirizine (XYZAL )  5 MG tablet Take 1 tablet (5 mg total) by mouth every evening. 03/17/23   Glendia Shad, MD  lisinopril  (ZESTRIL ) 40 MG tablet Take 1 tablet (40 mg total) by mouth daily. 08/05/23   Glendia Shad, MD  REPATHA  SURECLICK 140 MG/ML SOAJ INJECT 1 PEN SUBCUTANEOUSLY  EVERY 2 WEEKS 12/30/23   Glendia Shad, MD  sertraline  (ZOLOFT ) 50 MG tablet TAKE 1 TABLET BY MOUTH DAILY 03/03/23   Glendia Shad, MD     Critical care time:  62   Belva November, MD [facility] 01/06/2024 3:48 PM

## 2024-01-06 NOTE — Consult Note (Signed)
 PHARMACY CONSULT NOTE - Hypertonic Saline  Pharmacy Consult for Hypertonic Saline Monitoring and Management  Recent Labs: Potassium (mmol/L)  Date Value  01/05/2024 4.7   Magnesium  (mg/dL)  Date Value  97/95/7974 2.3   Calcium  (mg/dL)  Date Value  97/95/7974 8.1 (L)   Albumin (g/dL)  Date Value  97/95/7974 2.5 (L)  03/24/2023 4.2   Phosphorus (mg/dL)  Date Value  97/95/7974 2.2 (L)   Sodium (mmol/L)  Date Value  01/06/2024 119 (LL)  04/29/2023 136   Assessment  Jill Crosby is a 63 y.o. female presenting with cough, shortness of breath and decrease po intake in addition to N/V. They were intubated due to respiratory distress secondary to influenza. They were found to be hyponatremic possibly due to offending PTA medications. However, none have been re-started while inpatient. Pharmacy has been consulted to monitor hypertonic saline (3%) infusion.  Pertinent medications:  Hydrochlorothiazide  (not started inpatient) Sertraline  (not started inpatient)  Baseline Labs: Serum Na 115, Serum Osm ordered, Urine Na ordered, Urine Osm ordered  Goal of Therapy:  Increase in Na by 4-6 mEq/L in 4-6 hours Do not exceed increase in Na by 10-12 mEq/L in 24 hours  Monitoring:  Date Time Na Rate/Comment  (Was on at Nacl 3% 20 ml/hr until 1340 on 01/05/24) 0204 1829 - Gtt re-started at 25 mL/hour 0204  1833 116 Baseline 0204 2047 118 Increased 2 mEq in 2 hours 0204 2250 117 Down 1 mEq 0205 0232 119 Up 2 mEq  Plan:  Continue 3% saline at 25 mL/hr Check Na Q2H x2 then Q4H  Stop infusion if  Na increases > 4 mEq/L in first 2 hours Na increases > 6 mEq/L in first 4 hours Na increases > 6 mEq/L in 6 hours  Na increases > 8 mEq/L in 8 hours (give D5W bolus) Continue to monitor for signs of clinical improvement and recommendations per nephrology  Thank you for involving pharmacy in this patient's care.   Rankin [name], PharmD, MBA 01/06/2024 5:08 AM

## 2024-01-07 DIAGNOSIS — E871 Hypo-osmolality and hyponatremia: Secondary | ICD-10-CM | POA: Diagnosis not present

## 2024-01-07 DIAGNOSIS — J9601 Acute respiratory failure with hypoxia: Secondary | ICD-10-CM | POA: Diagnosis not present

## 2024-01-07 LAB — BASIC METABOLIC PANEL
Anion gap: 8 (ref 5–15)
Anion gap: 11 (ref 5–15)
BUN: 10 mg/dL (ref 8–23)
BUN: 13 mg/dL (ref 8–23)
CO2: 31 mmol/L (ref 22–32)
CO2: 29 mmol/L (ref 22–32)
Calcium: 8.4 mg/dL — ABNORMAL LOW (ref 8.9–10.3)
Calcium: 8.7 mg/dL — ABNORMAL LOW (ref 8.9–10.3)
Chloride: 86 mmol/L — ABNORMAL LOW (ref 98–111)
Chloride: 87 mmol/L — ABNORMAL LOW (ref 98–111)
Creatinine, Ser: 0.63 mg/dL (ref 0.44–1.00)
Creatinine, Ser: 0.77 mg/dL (ref 0.44–1.00)
GFR, Estimated: >60 mL/min (ref >60)
GFR, Estimated: >60 mL/min (ref >60)
Glucose, Bld: 75 mg/dL (ref 70–99)
Glucose, Bld: 273 mg/dL — ABNORMAL HIGH (ref 70–99)
Potassium: 3.2 mmol/L — ABNORMAL LOW (ref 3.5–5.1)
Potassium: 3.4 mmol/L — ABNORMAL LOW (ref 3.5–5.1)
Sodium: 125 mmol/L — ABNORMAL LOW (ref 135–145)
Sodium: 127 mmol/L — ABNORMAL LOW (ref 135–145)

## 2024-01-07 LAB — GLUCOSE, CAPILLARY
Glucose-Capillary: 118 mg/dL — ABNORMAL HIGH (ref 70–99)
Glucose-Capillary: 190 mg/dL — ABNORMAL HIGH (ref 70–99)
Glucose-Capillary: 205 mg/dL — ABNORMAL HIGH (ref 70–99)
Glucose-Capillary: 227 mg/dL — ABNORMAL HIGH (ref 70–99)
Glucose-Capillary: 357 mg/dL — ABNORMAL HIGH (ref 70–99)
Glucose-Capillary: 360 mg/dL — ABNORMAL HIGH (ref 70–99)
Glucose-Capillary: 404 mg/dL — ABNORMAL HIGH (ref 70–99)
Glucose-Capillary: 95 mg/dL (ref 70–99)
Glucose-Capillary: 96 mg/dL (ref 70–99)

## 2024-01-07 LAB — PHOSPHORUS: Phosphorus: 2.9 mg/dL (ref 2.5–4.6)

## 2024-01-07 LAB — SODIUM
Sodium: 128 mmol/L — ABNORMAL LOW (ref 135–145)
Sodium: 127 mmol/L — ABNORMAL LOW (ref 135–145)
Sodium: 127 mmol/L — ABNORMAL LOW (ref 135–145)
Sodium: 126 mmol/L — ABNORMAL LOW (ref 135–145)
Sodium: 127 mmol/L — ABNORMAL LOW (ref 135–145)
Sodium: 127 mmol/L — ABNORMAL LOW (ref 135–145)

## 2024-01-07 LAB — CBC
HCT: 30.1 % — ABNORMAL LOW (ref 36.0–46.0)
Hemoglobin: 10.4 g/dL — ABNORMAL LOW (ref 12.0–15.0)
MCH: 29.5 pg (ref 26.0–34.0)
MCHC: 34.6 g/dL (ref 30.0–36.0)
MCV: 85.5 fL (ref 80.0–100.0)
Platelets: 382 10*3/uL (ref 150–400)
RBC: 3.52 MIL/uL — ABNORMAL LOW (ref 3.87–5.11)
RDW: 13.3 % (ref 11.5–15.5)
WBC: 15 10*3/uL — ABNORMAL HIGH (ref 4.0–10.5)
nRBC: 0 % (ref 0.0–0.2)

## 2024-01-07 LAB — LACTATE DEHYDROGENASE, ISOENZYMES
LDH 1: 27 % (ref 17–32)
LDH 2: 36 % (ref 25–40)
LDH 3: 23 % (ref 17–27)
LDH 4: 8 % (ref 5–13)
LDH 5: 6 % (ref 4–20)
LDH Isoenzymes, Total: 360 [IU]/L — ABNORMAL HIGH (ref 119–226)

## 2024-01-07 LAB — MAGNESIUM: Magnesium: 2.2 mg/dL (ref 1.7–2.4)

## 2024-01-07 MED ORDER — INSULIN GLARGINE-YFGN 100 UNIT/ML ~~LOC~~ SOLN
10.0000 [IU] | Freq: Two times a day (BID) | SUBCUTANEOUS | Status: DC
  Filled 2024-01-07: qty 0.1

## 2024-01-07 MED ORDER — INSULIN GLARGINE-YFGN 100 UNIT/ML ~~LOC~~ SOLN
20.0000 [IU] | Freq: Two times a day (BID) | SUBCUTANEOUS | Status: DC
  Administered 2024-01-07 – 2024-01-12 (×10): 20 [IU] via SUBCUTANEOUS
  Filled 2024-01-07 (×11): qty 0.2

## 2024-01-07 MED ORDER — INSULIN ASPART 100 UNIT/ML IJ SOLN
0.0000 [IU] | Freq: Three times a day (TID) | INTRAMUSCULAR | Status: DC
  Administered 2024-01-07: 5 [IU] via SUBCUTANEOUS
  Filled 2024-01-07: qty 1

## 2024-01-07 MED ORDER — INSULIN GLARGINE-YFGN 100 UNIT/ML ~~LOC~~ SOLN
20.0000 [IU] | Freq: Two times a day (BID) | SUBCUTANEOUS | Status: DC
Start: 2024-01-07 — End: 2024-01-07
  Filled 2024-01-07: qty 0.2

## 2024-01-07 MED ORDER — INSULIN ASPART 100 UNIT/ML IJ SOLN: 0.0000 [IU] | Freq: Every day | INTRAMUSCULAR | Status: DC

## 2024-01-07 MED ORDER — INSULIN GLARGINE-YFGN 100 UNIT/ML ~~LOC~~ SOLN
12.0000 [IU] | Freq: Two times a day (BID) | SUBCUTANEOUS | Status: DC
Start: 2024-01-07 — End: 2024-01-07
  Filled 2024-01-07: qty 0.12

## 2024-01-07 MED ORDER — INSULIN ASPART 100 UNIT/ML IJ SOLN
0.0000 [IU] | INTRAMUSCULAR | Status: DC
  Administered 2024-01-07: 15 [IU] via SUBCUTANEOUS
  Administered 2024-01-08: 4 [IU] via SUBCUTANEOUS
  Administered 2024-01-08: 20 [IU] via SUBCUTANEOUS
  Administered 2024-01-08: 7 [IU] via SUBCUTANEOUS
  Administered 2024-01-08 (×3): 4 [IU] via SUBCUTANEOUS
  Administered 2024-01-09: 11 [IU] via SUBCUTANEOUS
  Administered 2024-01-09: 3 [IU] via SUBCUTANEOUS
  Administered 2024-01-09: 11 [IU] via SUBCUTANEOUS
  Administered 2024-01-09: 4 [IU] via SUBCUTANEOUS
  Administered 2024-01-09: 11 [IU] via SUBCUTANEOUS
  Administered 2024-01-10: 4 [IU] via SUBCUTANEOUS
  Administered 2024-01-10: 20 [IU] via SUBCUTANEOUS
  Administered 2024-01-10: 4 [IU] via SUBCUTANEOUS
  Administered 2024-01-10: 7 [IU] via SUBCUTANEOUS
  Administered 2024-01-11: 15 [IU] via SUBCUTANEOUS
  Administered 2024-01-11: 11 [IU] via SUBCUTANEOUS
  Administered 2024-01-11: 4 [IU] via SUBCUTANEOUS
  Administered 2024-01-11: 7 [IU] via SUBCUTANEOUS
  Administered 2024-01-12: 4 [IU] via SUBCUTANEOUS
  Administered 2024-01-12: 3 [IU] via SUBCUTANEOUS
  Filled 2024-01-07 (×22): qty 1

## 2024-01-07 MED ORDER — FUROSEMIDE 10 MG/ML IJ SOLN
40.0000 mg | Freq: Once | INTRAMUSCULAR | Status: AC
  Administered 2024-01-07: 40 mg via INTRAVENOUS
  Filled 2024-01-07: qty 4

## 2024-01-07 MED ORDER — INSULIN GLARGINE-YFGN 100 UNIT/ML ~~LOC~~ SOLN
8.0000 [IU] | Freq: Two times a day (BID) | SUBCUTANEOUS | Status: DC
  Filled 2024-01-07: qty 0.08

## 2024-01-07 MED ORDER — INSULIN ASPART 100 UNIT/ML IJ SOLN: 2.0000 [IU] | INTRAMUSCULAR | Status: DC

## 2024-01-07 MED ORDER — ENSURE ENLIVE PO LIQD
237.0000 mL | Freq: Two times a day (BID) | ORAL | Status: DC
  Administered 2024-01-07: 237 mL via ORAL

## 2024-01-07 MED ORDER — ENSURE MAX PROTEIN PO LIQD
11.0000 [oz_av] | Freq: Two times a day (BID) | ORAL | Status: DC
  Administered 2024-01-08 – 2024-01-11 (×7): 11 [oz_av] via ORAL
  Filled 2024-01-07: qty 330

## 2024-01-07 MED ORDER — ADULT MULTIVITAMIN W/MINERALS CH
1.0000 | ORAL_TABLET | Freq: Every day | ORAL | Status: DC
  Administered 2024-01-10 – 2024-01-12 (×3): 1 via ORAL
  Filled 2024-01-07 (×3): qty 1

## 2024-01-07 MED ORDER — SODIUM CHLORIDE 3 % IV SOLN
INTRAVENOUS | Status: DC
  Filled 2024-01-07 (×2): qty 500

## 2024-01-07 MED ORDER — POTASSIUM CHLORIDE 10 MEQ/50ML IV SOLN
10.0000 meq | INTRAVENOUS | Status: AC
  Administered 2024-01-07 (×6): 10 meq via INTRAVENOUS
  Filled 2024-01-07 (×6): qty 50

## 2024-01-07 NOTE — Progress Notes (Signed)
 [facility]  ROUNDING NOTE   Subjective:   Patient awake and alert this a.m. Resting comfortably in bed. Serum sodium currently 127. Case discussed with critical care as well as pharmacy. We agreed to resume 3% saline.  Objective:  Vital signs in last 24 hours:  Temp:  [97.7 F (36.5 C)-98.4 F (36.9 C)] 98.3 F (36.8 C) (02/06 0300) Pulse Rate:  [69-103] 73 (02/06 0600) Resp:  [12-24] 13 (02/06 0600) BP: (133-165)/(47-123) 158/65 (02/06 0600) SpO2:  [87 %-99 %] 98 % (02/06 0600) FiO2 (%):  [40 %] 40 % (02/06 0300) Weight:  [116.9 kg] 116.9 kg (02/06 0500)  Weight change: -2.9 kg Filed Weights   01/05/24 0330 01/06/24 0500 01/07/24 0500  Weight: 119.8 kg 119.8 kg 116.9 kg    Intake/Output: I/O last 3 completed shifts: In: 1757.3 [P.O.:360; I.V.:443.2; IV Piggyback:954.1] Out: 4375 [Urine:4375]   Intake/Output this shift:  No intake/output data recorded.  Physical Exam: General: No acute distress  Head: Normocephalic, atraumatic. Moist oral mucosal membranes  Neck: Supple  Lungs:  Scattered rhonchi, normal effort  Heart: S1S2 no rubs  Abdomen:  Soft, nontender, bowel sounds present  Extremities: Trace peripheral edema.  Neurologic: Arousable  Skin: No acute rash  Access: No hemodialysis access    Basic Metabolic Panel: Recent Labs  Lab 01/03/24 1633 01/03/24 1735 01/03/24 1840 01/03/24 2036 01/04/24 0106 01/04/24 0413 01/04/24 0532 01/05/24 0505 01/05/24 0935 01/06/24 9471 01/06/24 0645 01/06/24 1812 01/06/24 2302 01/07/24 0219 01/07/24 0503 01/07/24 0631  NA <102*   < >  --  103*   < > 108*   < > 110*   < > 120*   < > 128* 126* 128* 125* 127*  K 3.2*  --   --   --   --  3.8  --  4.7  --  3.5  --   --   --   --  3.2*  --   CL <65*  --   --   --   --  74*  --  77*  --  84*  --   --   --   --  86*  --   CO2 24  --   --   --   --  27  --  26  --  29  --   --   --   --  31  --   GLUCOSE 324*  --   --   --   --  190*  --  260*  --  94   --   --   --   --  75  --   BUN 10  --   --   --   --  11  --  14  --  10  --   --   --   --  10  --   CREATININE 0.63  --  0.64  --   --  0.80  --  0.77  --  0.63  --   --   --   --  0.63  --   CALCIUM  8.0*  --   --   --   --  7.8*  --  8.1*  --  8.3*  --   --   --   --  8.4*  --   MG  --   --   --  2.2  --  2.1  --  2.3  --  2.2  --   --   --   --  2.2  --   PHOS  --   --   --   --   --  3.3  --  2.2*  --  2.3*  --   --   --   --  2.9  --    < > = values in this interval not displayed.    Liver Function Tests: Recent Labs  Lab 01/03/24 1633 01/04/24 0413 01/05/24 0505 01/06/24 0528  AST 90* 50* 35 28  ALT 56* 41 40 33  ALKPHOS 56 49 45 49  BILITOT 1.4* 0.8 0.7 0.9  PROT 6.8 5.7* 5.7* 5.7*  ALBUMIN 3.4* 2.6* 2.5* 2.5*   Recent Labs  Lab 01/03/24 1840  LIPASE 26   No results for input(s): AMMONIA in the last 168 hours.  CBC: Recent Labs  Lab 01/03/24 1633 01/03/24 2036 01/04/24 0413 01/05/24 0505 01/06/24 0528 01/07/24 0503  WBC 23.1* 22.7* 21.3* 30.0* 21.2* 15.0*  NEUTROABS 20.6*  --   --   --   --   --   HGB 12.0 12.4 10.9* 10.3* 10.4* 10.4*  HCT RESULTS UNAVAILABLE DUE TO INTERFERING SUBSTANCE RESULTS UNAVAILABLE DUE TO INTERFERING SUBSTANCE 29.2* 28.2* 28.7* 30.1*  MCV RESULTS UNAVAILABLE DUE TO INTERFERING SUBSTANCE RESULTS UNAVAILABLE DUE TO INTERFERING SUBSTANCE 79.6* 82.0 82.9 85.5  PLT 323 291 299 340 367 382    Cardiac Enzymes: No results for input(s): CKTOTAL, CKMB, CKMBINDEX, TROPONINI in the last 168 hours.  BNP: Invalid input(s): POCBNP  CBG: Recent Labs  Lab 01/06/24 1946 01/06/24 2203 01/06/24 2307 01/07/24 0319 01/07/24 0732  GLUCAP 290* 288* 237* 95 96    Microbiology: Results for orders placed or performed during the hospital encounter of 01/03/24  Resp panel by RT-PCR (RSV, Flu A&B, Covid) Anterior Nasal Swab     Status: Abnormal   Collection Time: 01/03/24  2:52 PM   Specimen: Anterior Nasal Swab  Result Value Ref  Range Status   SARS Coronavirus 2 by RT PCR NEGATIVE NEGATIVE Final    Comment: (NOTE) SARS-CoV-2 target nucleic acids are NOT DETECTED.  The SARS-CoV-2 RNA is generally detectable in upper respiratory specimens during the acute phase of infection. The lowest concentration of SARS-CoV-2 viral copies this assay can detect is 138 copies/mL. A negative result does not preclude SARS-Cov-2 infection and should not be used as the sole basis for treatment or other patient management decisions. A negative result may occur with  improper specimen collection/handling, submission of specimen other than nasopharyngeal swab, presence of viral mutation(s) within the areas targeted by this assay, and inadequate number of viral copies(<138 copies/mL). A negative result must be combined with clinical observations, patient history, and epidemiological information. The expected result is Negative.  Fact Sheet for Patients:  bloggercourse.com  Fact Sheet for Healthcare Providers:  seriousbroker.it  This test is no t yet approved or cleared by the [place]  FDA and  has been authorized for detection and/or diagnosis of SARS-CoV-2 by FDA under an Emergency Use Authorization (EUA). This EUA will remain  in effect (meaning this test can be used) for the duration of the COVID-19 declaration under Section 564(b)(1) of the Act, 21 U.S.C.section 360bbb-3(b)(1), unless the authorization is terminated  or revoked sooner.       Influenza A by PCR POSITIVE (A) NEGATIVE Final   Influenza B by PCR NEGATIVE NEGATIVE Final    Comment: (NOTE) The Xpert Xpress SARS-CoV-2/FLU/RSV plus assay is intended as an aid in the diagnosis of influenza from Nasopharyngeal swab specimens and should  not be used as a sole basis for treatment. Nasal washings and aspirates are unacceptable for Xpert Xpress SARS-CoV-2/FLU/RSV testing.  Fact Sheet for  Patients: bloggercourse.com  Fact Sheet for Healthcare Providers: seriousbroker.it  This test is not yet approved or cleared by the [place]  FDA and has been authorized for detection and/or diagnosis of SARS-CoV-2 by FDA under an Emergency Use Authorization (EUA). This EUA will remain in effect (meaning this test can be used) for the duration of the COVID-19 declaration under Section 564(b)(1) of the Act, 21 U.S.C. section 360bbb-3(b)(1), unless the authorization is terminated or revoked.     Resp Syncytial Virus by PCR NEGATIVE NEGATIVE Final    Comment: (NOTE) Fact Sheet for Patients: bloggercourse.com  Fact Sheet for Healthcare Providers: seriousbroker.it  This test is not yet approved or cleared by the [place]  FDA and has been authorized for detection and/or diagnosis of SARS-CoV-2 by FDA under an Emergency Use Authorization (EUA). This EUA will remain in effect (meaning this test can be used) for the duration of the COVID-19 declaration under Section 564(b)(1) of the Act, 21 U.S.C. section 360bbb-3(b)(1), unless the authorization is terminated or revoked.  Performed at [facility], [address] [place], [place] [address]   Blood culture (routine x 2)     Status: None (Preliminary result)   Collection Time: 01/03/24  5:43 PM   Specimen: BLOOD LEFT HAND  Result Value Ref Range Status   Specimen Description BLOOD LEFT HAND  Final   Special Requests   Final    BOTTLES DRAWN AEROBIC AND ANAEROBIC Blood Culture results may not be optimal due to an inadequate volume of blood received in culture bottles   Culture   Final    NO GROWTH 3 DAYS Performed at [facility], [address] [place], [place] [address]    Report Status PENDING  Incomplete  Blood culture (routine x 2)     Status: None (Preliminary result)   Collection Time: 01/03/24   6:40 PM   Specimen: BLOOD RIGHT HAND  Result Value Ref Range Status   Specimen Description BLOOD RIGHT HAND  Final   Special Requests   Final    BOTTLES DRAWN AEROBIC AND ANAEROBIC Blood Culture results may not be optimal due to an inadequate volume of blood received in culture bottles   Culture   Final    NO GROWTH 3 DAYS Performed at [facility], [address] [place], [place] [address]    Report Status PENDING  Incomplete  Culture, blood (Routine X 2) w Reflex to ID Panel     Status: None (Preliminary result)   Collection Time: 01/03/24  8:36 PM   Specimen: BLOOD  Result Value Ref Range Status   Specimen Description BLOOD RIGHT HAND  Final   Special Requests   Final    BOTTLES DRAWN AEROBIC ONLY Blood Culture results may not be optimal due to an inadequate volume of blood received in culture bottles   Culture   Final    NO GROWTH 3 DAYS Performed at [facility], [address], [place], [place] [address]    Report Status PENDING  Incomplete  Culture, blood (Routine X 2) w Reflex to ID Panel     Status: None (Preliminary result)   Collection Time: 01/03/24  8:40 PM   Specimen: BLOOD  Result Value Ref Range Status   Specimen Description BLOOD BLOOD LEFT HAND  Final   Special Requests   Final  BOTTLES DRAWN AEROBIC AND ANAEROBIC Blood Culture results may not be optimal due to an inadequate volume of blood received in culture bottles   Culture   Final    NO GROWTH 3 DAYS Performed at [facility], [address] [place], [place] [address]    Report Status PENDING  Incomplete  MRSA Next Gen by PCR, Nasal     Status: None   Collection Time: 01/03/24  8:45 PM   Specimen: Nasal Mucosa; Nasal Swab  Result Value Ref Range Status   MRSA by PCR Next Gen NOT DETECTED NOT DETECTED Final    Comment: (NOTE) The GeneXpert MRSA Assay (FDA approved for NASAL specimens only), is one component of a comprehensive MRSA colonization surveillance program. It  is not intended to diagnose MRSA infection nor to guide or monitor treatment for MRSA infections. Test performance is not FDA approved in patients less than 29 years old. Performed at [facility], [address] [place], [place] [address]   Respiratory (~20 pathogens) panel by PCR     Status: Abnormal   Collection Time: 01/04/24  5:28 PM   Specimen: Nasopharyngeal Swab; Respiratory  Result Value Ref Range Status   Adenovirus NOT DETECTED NOT DETECTED Final   Coronavirus 229E NOT DETECTED NOT DETECTED Final    Comment: (NOTE) The Coronavirus on the Respiratory Panel, DOES NOT test for the novel  Coronavirus (2019 nCoV)    Coronavirus HKU1 NOT DETECTED NOT DETECTED Final   Coronavirus NL63 NOT DETECTED NOT DETECTED Final   Coronavirus OC43 NOT DETECTED NOT DETECTED Final   Metapneumovirus NOT DETECTED NOT DETECTED Final   Rhinovirus / Enterovirus NOT DETECTED NOT DETECTED Final   Influenza A H1 2009 DETECTED (A) NOT DETECTED Final   Influenza B NOT DETECTED NOT DETECTED Final   Parainfluenza Virus 1 NOT DETECTED NOT DETECTED Final   Parainfluenza Virus 2 NOT DETECTED NOT DETECTED Final   Parainfluenza Virus 3 NOT DETECTED NOT DETECTED Final   Parainfluenza Virus 4 NOT DETECTED NOT DETECTED Final   Respiratory Syncytial Virus NOT DETECTED NOT DETECTED Final   Bordetella pertussis NOT DETECTED NOT DETECTED Final   Bordetella Parapertussis NOT DETECTED NOT DETECTED Final   Chlamydophila pneumoniae NOT DETECTED NOT DETECTED Final   Mycoplasma pneumoniae NOT DETECTED NOT DETECTED Final    Comment: Performed at [facility], [address] [place], [place] [address]    Coagulation Studies: No results for input(s): LABPROT, INR in the last 72 hours.  Urinalysis: No results for input(s): COLORURINE, LABSPEC, PHURINE, GLUCOSEU, HGBUR, BILIRUBINUR, KETONESUR, PROTEINUR, UROBILINOGEN, NITRITE, LEUKOCYTESUR in the last 72 hours.  Invalid  input(s): APPERANCEUR     Imaging: ECHOCARDIOGRAM COMPLETE Result Date: 01/05/2024    ECHOCARDIOGRAM REPORT   Patient Name:   [facility] Date of Exam: 01/05/2024 Medical Rec #:  [id number]        Height:       64.0 in Accession #:    [id number]       Weight:       264.1 lb Date of Birth:  Oct 24, 1961        BSA:          2.202 m Patient Age:    62 years         BP:           98/53 mmHg Patient Gender: F                HR:  51 bpm. Exam Location:  [facility] Procedure: 2D Echo, Cardiac Doppler and Color Doppler Indications:     Dyspnea  History:         Patient has no prior history of Echocardiogram examinations.                  Signs/Symptoms:Dyspnea and Dizziness/Lightheadedness; Risk                  Factors:Hypertension, Diabetes and Current Smoker.  Sonographer:     Naomie Reef Referring Phys:  [id number] KHABIB DGAYLI Diagnosing Phys: [name] MD  Sonographer Comments: Technically difficult study due to poor echo windows, echo performed with patient supine and on artificial respirator and patient is obese. IMPRESSIONS  1. Left ventricular ejection fraction, by estimation, is 55 to 60%. The left ventricle has normal function. Left ventricular endocardial border not optimally defined to evaluate regional wall motion. The left ventricular internal cavity size was mildly dilated. There is mild left ventricular hypertrophy. Left ventricular diastolic parameters are consistent with Grade II diastolic dysfunction (pseudonormalization).  2. Right ventricular systolic function is normal. The right ventricular size is normal.  3. Left atrial size was mildly dilated.  4. The mitral valve is grossly normal. Mild mitral valve regurgitation. No evidence of mitral stenosis.  5. The aortic valve is tricuspid. Aortic valve regurgitation is not visualized. No aortic stenosis is present. FINDINGS  Left Ventricle: Left ventricular ejection fraction, by estimation, is 55 to 60%. The left ventricle has normal  function. Left ventricular endocardial border not optimally defined to evaluate regional wall motion. The left ventricular internal cavity size was mildly dilated. There is mild left ventricular hypertrophy. Left ventricular diastolic parameters are consistent with Grade II diastolic dysfunction (pseudonormalization). Right Ventricle: The right ventricular size is normal. No increase in right ventricular wall thickness. Right ventricular systolic function is normal. Left Atrium: Left atrial size was mildly dilated. Right Atrium: Right atrial size was normal in size. Pericardium: There is no evidence of pericardial effusion. Mitral Valve: The mitral valve is grossly normal. Mild mitral valve regurgitation. No evidence of mitral valve stenosis. MV peak gradient, 5.2 mmHg. The mean mitral valve gradient is 2.0 mmHg. Tricuspid Valve: The tricuspid valve is not well visualized. Tricuspid valve regurgitation is not demonstrated. Aortic Valve: The aortic valve is tricuspid. There is mild aortic valve annular calcification. Aortic valve regurgitation is not visualized. No aortic stenosis is present. Aortic valve mean gradient measures 8.0 mmHg. Aortic valve peak gradient measures 15.5 mmHg. Aortic valve area, by VTI measures 1.66 cm. Pulmonic Valve: The pulmonic valve was not well visualized. Pulmonic valve regurgitation is not visualized. No evidence of pulmonic stenosis. Aorta: The aortic root is normal in size and structure. Venous: IVC assessment for right atrial pressure unable to be performed due to mechanical ventilation. IAS/Shunts: The interatrial septum was not well visualized.  LEFT VENTRICLE PLAX 2D LVIDd:         5.30 cm     Diastology LVIDs:         3.00 cm     LV e' medial:    7.51 cm/s LV PW:         1.10 cm     LV E/e' medial:  15.6 LV IVS:        1.00 cm     LV e' lateral:   7.83 cm/s LVOT diam:     2.00 cm     LV E/e' lateral: 14.9 LV SV:  82 LV SV Index:   37 LVOT Area:     3.14 cm  LV Volumes  (MOD) LV vol d, MOD A2C: 52.9 ml LV vol d, MOD A4C: 54.9 ml LV vol s, MOD A2C: 19.3 ml LV vol s, MOD A4C: 24.6 ml LV SV MOD A2C:     33.6 ml LV SV MOD A4C:     54.9 ml LV SV MOD BP:      33.2 ml RIGHT VENTRICLE RV Basal diam:  2.90 cm RV Mid diam:    2.80 cm RV S prime:     9.03 cm/s TAPSE (M-mode): 2.4 cm LEFT ATRIUM             Index        RIGHT ATRIUM           Index LA diam:        4.60 cm 2.09 cm/m   RA Area:     15.30 cm LA Vol (A2C):   76.3 ml 34.65 ml/m  RA Volume:   39.00 ml  17.71 ml/m LA Vol (A4C):   58.9 ml 26.75 ml/m LA Biplane Vol: 71.6 ml 32.52 ml/m  AORTIC VALVE                     PULMONIC VALVE AV Area (Vmax):    1.66 cm      PV Vmax:       0.98 m/s AV Area (Vmean):   1.67 cm      PV Peak grad:  3.8 mmHg AV Area (VTI):     1.66 cm AV Vmax:           197.00 cm/s AV Vmean:          131.000 cm/s AV VTI:            0.491 m AV Peak Grad:      15.5 mmHg AV Mean Grad:      8.0 mmHg LVOT Vmax:         104.00 cm/s LVOT Vmean:        69.600 cm/s LVOT VTI:          0.260 m LVOT/AV VTI ratio: 0.53  AORTA Ao Root diam: 3.10 cm MITRAL VALVE MV Area (PHT): 3.02 cm     SHUNTS MV Area VTI:   1.68 cm     Systemic VTI:  0.26 m MV Peak grad:  5.2 mmHg     Systemic Diam: 2.00 cm MV Mean grad:  2.0 mmHg MV Vmax:       1.14 m/s MV Vmean:      57.9 cm/s MV Decel Time: 251 msec MV E velocity: 117.00 cm/s MV A velocity: 87.40 cm/s MV E/A ratio:  1.34 Lonni End MD Electronically signed by [name] MD Signature Date/Time: 01/05/2024/10:09:06 AM    Final      Medications:    azithromycin  500 mg (01/07/24 0744)   cefTRIAXone  (ROCEPHIN )  IV 1 g (01/07/24 0658)   potassium chloride      sodium chloride  (hypertonic)      Chlorhexidine  Gluconate Cloth  6 each Topical Daily   enoxaparin  (LOVENOX ) injection  60 mg Subcutaneous Q24H   insulin  aspart  0-6 Units Subcutaneous Q4H   insulin  glargine-yfgn  10 Units Subcutaneous BID   nicotine   14 mg Transdermal Daily   mouth rinse  15 mL Mouth Rinse 4  times per day   oseltamivir   75 mg Oral BID   sodium chloride  flush  10-40 mL Intracatheter Q12H   dextrose , hydrALAZINE , mouth rinse, polyethylene glycol  Assessment/ Plan:  63 y.o. female with past medical history of diabetes mellitus type 1, hypertension, congestive heart failure, obesity, depression, hyperlipidemia who presented to emergency department with cough, shortness of breath, decreased p.o. intake as well as nausea and vomiting.  Found to be influenza A positive with respiratory distress and initially required mechanical ventilation.  1.  Hyponatremia.  Appears to be multifactorial.  Patient was on HCTZ as well as SSRIs at home.   Update: Serum sodium improved to 127.  Both DDAVP  and D5W boluses were employed to stabilize serum sodium yesterday.  Okay to resume 3% saline now with a target sodium of 134 in the next 24 hours.  2.  Acute respiratory failure.  Patient has been weaned off ventilator and BiPAP successfully.  Breathing comfortably at the moment.     LOS: 4 Blessings Inglett 2/6/20257:48 AM

## 2024-01-07 NOTE — Progress Notes (Addendum)
 Nutrition Follow Up Note   DOCUMENTATION CODES:   Morbid obesity  INTERVENTION:   Ensure Max protein supplement po BID, each supplement provides 150kcal and 30g of protein.  MVI po daily   Pt remains at high refeed risk; recommend monitor potassium, magnesium  and phosphorus labs daily until stable  Daily weights   NUTRITION DIAGNOSIS:   Inadequate oral intake related to inability to eat (pt sedated and ventilated) as evidenced by NPO status. -progressing   GOAL:   Patient will meet greater than or equal to 90% of their needs -progressing   MONITOR:   PO intake, Supplement acceptance, Labs, Weight trends, I & O's, Skin  ASSESSMENT:   63 y/o female with h/o anxiety, depression, CHF, HTN, type I DM (diangosed age 37y) and B12 deficiency who is admitted with Flu A and sepsis.  Met with pt in room today. Pt sitting in chair and reports that she is feeling much better. Pt initiated on a mechanical soft diet yesterday. Pt reports that she did eat some yesterday and she did eat some breakfast this morning. RD discussed with pt the importance of adequate nutrition needed to preserve lean muscle and help with her electrolyte abnormalities. Pt is agreeable to drink vanilla Ensure. RD will add supplements and MVI to help pt meet her estimated needs. Pt remains at refeed risk. Per chart, pt is up ~10lbs from her UBW. Pt -2.7L on her I & Os.    Update 1600: Pt with elevated blood sugar after drinking Ensure Enlive; will change to Ensure Max protein.   Medications reviewed and include: lovenox , insulin , MVI, nicotine , azithromycin , ceftriaxone , hypertonic saline  Labs reviewed: Na 127(L), K 3.2(L), P 2.9 wnl, Mg 2.2 wnl Wbc- 15.0(H), Hgb 10.4(L), Hct 30.1(L) Cbgs- 205, 96, 95 x 24 hrs   UOP-   Diet Order:   Diet Order             DIET DYS 3 Room service appropriate? Yes with Assist; Fluid consistency: Thin  Diet effective now                  EDUCATION NEEDS:    Education needs have been addressed  Skin:  Skin Assessment: Reviewed RN Assessment  Last BM:  2/6- type 6  Height:   Ht Readings from Last 1 Encounters:  01/03/24 5' 4 (1.626 m)    Weight:   Wt Readings from Last 1 Encounters:  01/07/24 116.9 kg    Ideal Body Weight:  54.5 kg  BMI:  Body mass index is 44.24 kg/m.  Estimated Nutritional Needs:   Kcal:  2200-2500kcal/day  Protein:  110-125g/day  Fluid:  1.4-1.6L/day  Augustin Shams MS, RD, LDN If unable to be reached, please send secure chat to RD inpatient available from 8:00a-4:00p daily

## 2024-01-07 NOTE — TOC PASRR Note (Signed)
 RE: Jill Crosby Date of Birth: 12/30/1960 Date: 01/07/2024   To Whom It May Concern:  Please be advised that the above-named patient will require a short-term nursing home stay - anticipated 30 days or less for rehabilitation and strengthening.  The plan is for return home.

## 2024-01-07 NOTE — Evaluation (Signed)
 Occupational Therapy Evaluation Patient Details Name: Jill Crosby MRN: [id number] DOB: 1961/07/27 Today's Date: 01/07/2024   History of Present Illness Pt is a 63 y.o. female presenting with severe hyponatremia and altered mental status. Patient is flu positive, extubated 2/4. PMH significant for T1DM, obesity, HTN.   Clinical Impression   Chart reviewed, pt greeted in room, alert and oriented x4, agreeable to OT evaluation. PTA pt is MOD I-I in ADL/IADL, amb with PRN use of SPC. Pt presents with deficits in strength, endurance, activity tolerance, balance, affecting safe and optimal ADL completion.Bed mobility completed with CGA, STS with MIN A, short amb transfer to bedside chair with RW with CGA-MIN A. Further attempts at amb limited due to pt reported fatigue. MAX A  required for LB dressing, SET UP for feeding/grooming tasks. Discussed importance of progressing mobility and out of bed attempts as appropriate to improve functional status. Pt will benefit from acute OT to address deficits and to faciliate optimal ADL performance. OT will follow acutely.       If plan is discharge home, recommend the following: A little help with walking and/or transfers;A lot of help with bathing/dressing/bathroom;Assist for transportation;Help with stairs or ramp for entrance;Assistance with cooking/housework    Functional Status Assessment  Patient has had a recent decline in their functional status and demonstrates the ability to make significant improvements in function in a reasonable and predictable amount of time.  Equipment Recommendations  BSC/3in1;Other (comment);Tub/shower bench (2WW)    Recommendations for Other Services       Precautions / Restrictions Precautions Precautions: Fall Restrictions Weight Bearing Restrictions Per Provider Order: No      Mobility Bed Mobility Overal bed mobility: Needs Assistance Bed Mobility: Supine to Sit     Supine to sit: Contact guard, HOB  elevated, Used rails          Transfers Overall transfer level: Needs assistance Equipment used: Rolling walker (2 wheels) Transfers: Sit to/from Stand Sit to Stand: Min assist                  Balance Overall balance assessment: Needs assistance Sitting-balance support: Feet supported Sitting balance-Leahy Scale: Good     Standing balance support: Reliant on assistive device for balance, During functional activity Standing balance-Leahy Scale: Fair                             ADL either performed or assessed with clinical judgement   ADL Overall ADL's : Needs assistance/impaired Eating/Feeding: Set up;Sitting   Grooming: Sitting;Set up           Upper Body Dressing : Minimal assistance Upper Body Dressing Details (indicate cue type and reason): anticipate Lower Body Dressing: Maximal assistance Lower Body Dressing Details (indicate cue type and reason): simulated Toilet Transfer: Contact guard assist;Minimal assistance;Rolling walker (2 wheels)short amb transfer  Toilet Transfer Details (indicate cue type and reason): simulated to bedside recliner, intermittent vcs for technique                 Vision Patient Visual Report: No change from baseline       Perception         Praxis         Pertinent Vitals/Pain Pain Assessment Pain Assessment: No/denies pain     Extremity/Trunk Assessment Upper Extremity Assessment Upper Extremity Assessment: Overall WFL for tasks assessed   Lower Extremity Assessment Lower Extremity Assessment: Generalized weakness  Communication Communication Communication: No apparent difficulties Cueing Techniques: Verbal cues   Cognition Arousal: Alert Behavior During Therapy: WFL for tasks assessed/performed Overall Cognitive Status: Impaired/Different from baseline Area of Impairment: Problem solving, Following commands                       Following Commands: Follows one step  commands consistently     Problem Solving: Slow processing General Comments: pt endorses she feels mildly slower than her norm     General Comments  pt on 5 L via [place] throughout, spo2 briefly down to 89% but grossly above 90% throughout mobility;    Exercises Other Exercises Other Exercises: edu pt and son re: role of OT, role of rehab, discharge recommendations, safe ADL completion   Shoulder Instructions      Home Living Family/patient expects to be discharged to:: Private residence Living Arrangements: Children (two adult children) Available Help at Discharge: Family;Available 24 hours/day (sons work night shift/third shift; report other family members could help as needed) Type of Home: House Home Access: Level entry     Home Layout: One level     Bathroom Shower/Tub: Tub/shower unit         Home Equipment: Cane - single point;Shower seat          Prior Functioning/Environment Prior Level of Function : Independent/Modified Independent             Mobility Comments: amb with SPC PRN community distances, reports she has bad knees and has been putting off a potential replacement ADLs Comments: generally MOD I-I in ADL/IADL; works FT from home        OT Problem List: Decreased strength;Decreased activity tolerance;Decreased knowledge of use of DME or AE;Impaired balance (sitting and/or standing)      OT Treatment/Interventions: Self-care/ADL training;DME and/or AE instruction;Therapeutic activities;Balance training;Therapeutic exercise;Energy conservation;Patient/family education    OT Goals(Current goals can be found in the care plan section) Acute Rehab OT Goals Patient Stated Goal: return to PLOF OT Goal Formulation: With patient/family Time For Goal Achievement: 01/21/24 Potential to Achieve Goals: Good ADL Goals Pt Will Perform Grooming: with modified independence;sitting;standing Pt Will Perform Lower Body Dressing: with modified  independence;sitting/lateral leans;sit to/from stand Pt Will Transfer to Toilet: with modified independence;ambulating Pt Will Perform Toileting - Clothing Manipulation and hygiene: with modified independence;sit to/from stand  OT Frequency: Min 1X/week    Co-evaluation              AM-PAC OT 6 Clicks Daily Activity     Outcome Measure Help from another person eating meals?: None Help from another person taking care of personal grooming?: None Help from another person toileting, which includes using toliet, bedpan, or urinal?: A Lot Help from another person bathing (including washing, rinsing, drying)?: A Lot Help from another person to put on and taking off regular upper body clothing?: A Little Help from another person to put on and taking off regular lower body clothing?: A Lot 6 Click Score: 17   End of Session Equipment Utilized During Treatment: Rolling walker (2 wheels);Standard Walker Nurse Communication: Mobility status  Activity Tolerance: Patient tolerated treatment well Patient left: in chair;with call bell/phone within reach;with family/visitor present  OT Visit Diagnosis: Other abnormalities of gait and mobility (R26.89);Muscle weakness (generalized) (M62.81)                Time: 9150-9088 OT Time Calculation (min): 22 min Charges:  OT General Charges $OT Visit: 1 Visit OT Evaluation $OT  Eval Moderate Complexity: 1 Mod  Therisa Sheffield, OTD OTR/L  01/07/24, 11:11 AM

## 2024-01-07 NOTE — Consult Note (Signed)
 PHARMACY CONSULT NOTE - Hypertonic Saline/Electrolytes  Pharmacy Consult for Hypertonic Saline Monitoring and Management  Recent Labs: Potassium (mmol/L)  Date Value  01/07/2024 3.2 (L)   Magnesium  (mg/dL)  Date Value  97/93/7974 2.2   Calcium  (mg/dL)  Date Value  97/93/7974 8.4 (L)   Albumin (g/dL)  Date Value  97/94/7974 2.5 (L)  03/24/2023 4.2   Phosphorus (mg/dL)  Date Value  97/93/7974 2.9   Sodium (mmol/L)  Date Value  01/07/2024 126 (L)  04/29/2023 136   Assessment  Jill Crosby is a 63 y.o. female presenting with cough, shortness of breath and decrease po intake in addition to N/V. They were intubated due to respiratory distress secondary to influenza. They were found to be hyponatremic possibly due to offending PTA medications. However, none have been re-started while inpatient. Pharmacy has been consulted to monitor hypertonic saline (3%) infusion.  Pertinent medications:  Hydrochlorothiazide  (not started inpatient) Sertraline  (not started inpatient)  Baseline Labs: Serum Na 115, Serum Osm ordered, Urine Na ordered, Urine Osm ordered  Goal of Therapy:  Increase in Na by 4-6 mEq/L in 4-6 hours Do not exceed increase in Na by 10-12 mEq/L in 24 hours  Monitoring:  Date Time Na Rate/Comment  (Was on at [facility] 3% 20 ml/hr until 1340 on 01/05/24) 0204 1829 - Gtt re-started at 25 mL/hour 0204  1833 116 Baseline 0204 2047 118 Increased 2 mEq in 2 hours 0204 2250 117 Down 1 mEq 0205 0232 119 Up 2 mEq 0205 0645 122 Up 3 mEq 0205 1010 126 Up 4 mEq(9 mEq in last 8 hours) **HTS stopped 02/05@1144  0205 1430 127 Up 1 mEq 0205 1812 128 Up 1 mEq 0205 2302 126 Down 2 mEq 0206 0503 125 Down 1 mEq **HTS resumed 0818 on 02/06@25ml /hr 0206 1035 127 Up 2 mEq 0206 1325 126 Down 1 mEq  Plan:  K 3.2: Kcl 10mEq IV x 6 doses Continue 3% saline at 25 mL/hr Check Na Q4H  Stop infusion if  Na increases > 4 mEq/L in first 2 hours Na increases > 6 mEq/L in first 4  hours Na increases > 6 mEq/L in 6 hours  Na increases > 8 mEq/L in 8 hours (give D5W bolus) Continue to monitor for signs of clinical improvement and recommendations per nephrology Will recheck all electrolytes with AM labs tomorrow  Thank you for involving pharmacy in this patient's care.   Macayla Ekdahl A Maranda Marte, PharmD Clinical Pharmacist 01/07/2024 3:13 PM

## 2024-01-07 NOTE — Progress Notes (Signed)
 NAME:  Jill Crosby, MRN:  [id number], DOB:  16-Nov-1961, LOS: 4 ADMISSION DATE:  01/03/2024, CHIEF COMPLAINT:  Hyponatremia, Encephalopathy   History of Present Illness:   This is 63 year old female with a history of type 1 diabetes since age 21, previously on insulin  pump which was switched to subcutaneous injections years ago.  She had an URTI recently with respiratory symptoms and worsening encephalopathy prompting presentation to the hospital.  History was obtained from family members who provided collateral information given intubation and mechanical ventilation. Additional background medical history includes essential hypertension, dyslipidemia, anxiety disorder, history of smoking, arthritis, morbid obesity and glaucoma. She presents with N/V and altered mental status after coming down with a cold over a week ago. She tested positive for influenza A in the ED. BMP showed hyperglycemia at 300 and severe hyponatremia and undetectably low levels less than 102 with hypochloremia.  She was encephalopathic and agitated and was intubated in the ED. PCCM was consulted for admission for management of encephalopathy, respiratory failure, and hyponatremia.  Pertinent  Medical History  -T1DM -HTN -HLD -Obesity  Significant Hospital Events: Including procedures, antibiotic start and stop dates in addition to other pertinent events   01/03/2024: admit to hospital, intubated, severe hyponatremia 01/04/2024: remains intubated, follows commands. Sodium 109-111 01/05/2024: passed SBT, followed commands, sodium improved, extubated to BiPAP 01/06/2024: awake and interactive on nasal cannula. Sodium improving on hypertonic saline 01/07/2024: mental status notably better, on nasal cannula, remains hypoxic.  Interim History / Subjective:  In chair having breakfast. Denies shortness of breath. Mental status clear. Denies chest pain.  Objective   Blood pressure (!) 148/54, pulse 80, temperature 98.4 F (36.9 C),  temperature source Oral, resp. rate 18, height 5' 4 (1.626 m), weight 116.9 kg, SpO2 90%.    FiO2 (%):  [40 %] 40 %   Intake/Output Summary (Last 24 hours) at 01/07/2024 0805 Last data filed at 01/07/2024 0305 Gross per 24 hour  Intake 1253.89 ml  Output 2700 ml  Net -1446.11 ml   Filed Weights   01/05/24 0330 01/06/24 0500 01/07/24 0500  Weight: 119.8 kg 119.8 kg 116.9 kg    Examination: Physical Exam Constitutional:      General: She is not in acute distress.    Appearance: She is obese. She is not ill-appearing.  Cardiovascular:     Rate and Rhythm: Normal rate and regular rhythm.     Heart sounds: Normal heart sounds.  Pulmonary:     Breath sounds: Rales (bibasilar rales on posterior auscultation) present. No wheezing.  Neurological:     Mental Status: She is oriented to person, place, and time. Mental status is at baseline.      Assessment & Plan:   #Acute Hypoxic Respiratory Failure #Severe Symptomatic Hyponatremia #Toxic Metabolic Encephalopathy #Severe Hyperglycemia without DKA #Influenza A infection #Acute on Chronic HFpEF  63 year old female with a history of T1DM presents with a few days history of URTI found to have influenza pneumonia and toxic metabolic encephalopathy secondary to severe hyponatremia. This is likely to osmotic diuresis, hydrochlorothiazide  use, SSRI use, and decreased PO intake. She was intubated for respiratory failure and encephalopathy and now extubated with improvement in respiratory status and mental status.  Neuro - discontinued analgosedation and psychotropic medications. Mental status improved. Holding SSRI given hyponatremia. Sodium improving slowly, but remains at risk for seizure. CV - required vasopressor support secondary to sedation related hypotension, now discontinued. Lactic acid within normal and is overall well perfused. Elevated BNP with  signs of volume overload suggests heart failure, TTE with signs of HFpEF. Did administer  furosemide  yesterday but holding today given rise in sodium.  Pulm - Respiratory failure following influenza infection with chest xray showing increased interstitial markings. This could represent findings secondary to influenza vs findings of decompensated heart failure given elevated BNP. Extubated to BiPAP after passing SBT and is now on nasal cannula. Will continue with nocturnal BiPAP. She remains hypoxic and has bibasilar crackles on exam. Will diurese today with 40 mg of IV furosemide . GI - Diet restarted. Renal - severe hyponatremia, initially < 102. Goal of 6 to 8 in 24 hours. Mildly overcorrected yesterday and required D5W and ddavp . This AM sodium is improving and at goal but did over correct in the afternoon. Will continue hypertonic saline. Appreciate input from nephrology. Endo - switched to basal bolus regimen of insulin  given T1DM. Hem/Onc - heparin  subQ for DVT prophylaxis ID - influenza infection, re-sent swab per ID's recommendations. Will continue antibiotics for CAP coverage. Continue Tamiflu .  Best Practice (right click and Reselect all SmartList Selections daily)   Diet/type: NPO DVT prophylaxis prophylactic heparin   Pressure ulcer(s): N/A GI prophylaxis: H2B Lines: Central line, Arterial Line, and yes and it is still needed Foley:  Yes, and it is still needed Code Status:  full code Last date of multidisciplinary goals of care discussion [01/07/2024]  Labs   CBC: Recent Labs  Lab 01/03/24 1633 01/03/24 2036 01/04/24 0413 01/05/24 0505 01/06/24 0528 01/07/24 0503  WBC 23.1* 22.7* 21.3* 30.0* 21.2* 15.0*  NEUTROABS 20.6*  --   --   --   --   --   HGB 12.0 12.4 10.9* 10.3* 10.4* 10.4*  HCT RESULTS UNAVAILABLE DUE TO INTERFERING SUBSTANCE RESULTS UNAVAILABLE DUE TO INTERFERING SUBSTANCE 29.2* 28.2* 28.7* 30.1*  MCV RESULTS UNAVAILABLE DUE TO INTERFERING SUBSTANCE RESULTS UNAVAILABLE DUE TO INTERFERING SUBSTANCE 79.6* 82.0 82.9 85.5  PLT 323 291 299 340 367 382     Basic Metabolic Panel: Recent Labs  Lab 01/03/24 1633 01/03/24 1735 01/03/24 1840 01/03/24 2036 01/04/24 0106 01/04/24 0413 01/04/24 0532 01/05/24 0505 01/05/24 0935 01/06/24 0528 01/06/24 0645 01/06/24 1812 01/06/24 2302 01/07/24 0219 01/07/24 0503 01/07/24 0631  NA <102*   < >  --  103*   < > 108*   < > 110*   < > 120*   < > 128* 126* 128* 125* 127*  K 3.2*  --   --   --   --  3.8  --  4.7  --  3.5  --   --   --   --  3.2*  --   CL <65*  --   --   --   --  74*  --  77*  --  84*  --   --   --   --  86*  --   CO2 24  --   --   --   --  27  --  26  --  29  --   --   --   --  31  --   GLUCOSE 324*  --   --   --   --  190*  --  260*  --  94  --   --   --   --  75  --   BUN 10  --   --   --   --  11  --  14  --  10  --   --   --   --  10  --   CREATININE 0.63  --  0.64  --   --  0.80  --  0.77  --  0.63  --   --   --   --  0.63  --   CALCIUM  8.0*  --   --   --   --  7.8*  --  8.1*  --  8.3*  --   --   --   --  8.4*  --   MG  --   --   --  2.2  --  2.1  --  2.3  --  2.2  --   --   --   --  2.2  --   PHOS  --   --   --   --   --  3.3  --  2.2*  --  2.3*  --   --   --   --  2.9  --    < > = values in this interval not displayed.   GFR: Estimated Creatinine Clearance: 89.3 mL/min (by C-G formula based on SCr of 0.63 mg/dL). Recent Labs  Lab 01/03/24 1738 01/03/24 1840 01/03/24 2036 01/03/24 2048 01/04/24 0341 01/04/24 0413 01/05/24 0505 01/06/24 0528 01/07/24 0503  PROCALCITON  --  0.23  --   --   --   --   --   --   --   WBC  --   --    < >  --   --  21.3* 30.0* 21.2* 15.0*  LATICACIDVEN 1.0  --   --  1.3 1.0  --   --   --   --    < > = values in this interval not displayed.    Liver Function Tests: Recent Labs  Lab 01/03/24 1633 01/04/24 0413 01/05/24 0505 01/06/24 0528  AST 90* 50* 35 28  ALT 56* 41 40 33  ALKPHOS 56 49 45 49  BILITOT 1.4* 0.8 0.7 0.9  PROT 6.8 5.7* 5.7* 5.7*  ALBUMIN 3.4* 2.6* 2.5* 2.5*   Recent Labs  Lab 01/03/24 1840  LIPASE 26    No results for input(s): AMMONIA in the last 168 hours.  ABG    Component Value Date/Time   PHART 7.44 01/06/2024 0528   PCO2ART 47 01/06/2024 0528   PO2ART 85 01/06/2024 0528   HCO3 31.9 (H) 01/06/2024 0528   O2SAT 98.6 01/06/2024 0528     Coagulation Profile: No results for input(s): INR, PROTIME in the last 168 hours.  Cardiac Enzymes: No results for input(s): CKTOTAL, CKMB, CKMBINDEX, TROPONINI in the last 168 hours.  HbA1C: Hemoglobin A1C  Date/Time Value Ref Range Status  07/25/2021 12:00 AM 7.0  Final   Hgb A1c MFr Bld  Date/Time Value Ref Range Status  01/04/2024 01:06 AM 6.9 (H) 4.8 - 5.6 % Final    Comment:    (NOTE) Pre diabetes:          5.7%-6.4%  Diabetes:              >6.4%  Glycemic control for   <7.0% adults with diabetes   03/24/2023 11:07 AM 7.1 (H) 4.8 - 5.6 % Final    Comment:             Prediabetes: 5.7 - 6.4          Diabetes: >6.4          Glycemic control for adults with diabetes: <7.0     CBG: Recent Labs  Lab 01/06/24 1946 01/06/24 2203 01/06/24 2307 01/07/24 0319 01/07/24 0732  GLUCAP 290* 288* 237* 95 96    Review of Systems:   Unable to obtain  Past Medical History:  She,  has a past medical history of Anxiety, Depression, Elevated BP, Hypercholesterolemia, Increased BMI, Menopause, Morbid obesity (HCC), and Type I diabetes mellitus (HCC).   Surgical History:   Past Surgical History:  Procedure Laterality Date   BREAST BIOPSY Left 05/09/2020   ribbon clip, stereo bx, HEMANGIOMA, BENIGN. - MAMMARY EPITHELIUM IS NOT PRESENT.   CATARACT EXTRACTION W/PHACO Left 11/08/2020   Procedure: CATARACT EXTRACTION PHACO AND INTRAOCULAR LENS PLACEMENT (IOC) LEFT DIABETIC 4.14 00:52.1 ;  Surgeon: Jaye Fallow, MD;  Location: [facility];  Service: Ophthalmology;  Laterality: Left;   CATARACT EXTRACTION W/PHACO Right 12/04/2020   Procedure: CATARACT EXTRACTION PHACO AND INTRAOCULAR LENS PLACEMENT (IOC)  RIGHT DIABETIC 6.11 00:46.1;  Surgeon: Jaye Fallow, MD;  Location[facility];  Service: Ophthalmology;  Laterality: Right;  Diabetic - insulin    CESAREAN SECTION  1997   COLONOSCOPY WITH PROPOFOL  N/A 01/18/2021   Procedure: COLONOSCOPY WITH PROPOFOL ;  Surgeon: Jinny Carmine, MD;  Location: [facility];  Service: Endoscopy;  Laterality: N/A;  priority 4     Social History:   reports that she has quit smoking. Her smoking use included cigarettes. She has a 10 pack-year smoking history. She has never used smokeless tobacco. She reports that she does not drink alcohol and does not use drugs.   Family History:  Her family history includes Breast cancer in her maternal aunt and maternal grandmother; Cancer in her father; Colon cancer in her maternal grandfather and paternal grandfather; Colon polyps in her mother; Diabetes in her maternal grandfather; Hyperlipidemia in her mother; Hypertension in her mother. There is no history of Heart disease or Ovarian cancer.   Allergies Allergies  Allergen Reactions   Codeine Nausea And Vomiting     Home Medications  Prior to Admission medications   Medication Sig Start Date End Date Taking? Authorizing Provider  meloxicam (MOBIC) 7.5 MG tablet Take 7.5 mg by mouth daily. 09/07/23  Yes [provider]  amLODipine  (NORVASC ) 10 MG tablet TAKE 1 TABLET BY MOUTH DAILY 08/05/23   Glendia Shad, MD  BAYER MICROLET LANCETS lancets USE AS DIRECTED TO CHECK  SUGARS 1 TO 2 TIMES A DAY 01/15/18   Glendia Shad, MD  cholecalciferol (VITAMIN D3) 25 MCG (1000 UT) tablet Take 1,000 Units by mouth daily.    [provider]  CONTOUR NEXT TEST test strip USE TO CHECK BLOOD GLUCOSE 6 TO  7 TIMES DAILY 07/10/23   Glendia Shad, MD  Cyanocobalamin  (B-12 PO) Take by mouth.    [provider]  hydrochlorothiazide  (HYDRODIURIL ) 25 MG tablet TAKE 1 TABLET BY MOUTH DAILY 03/03/23   Glendia Shad, MD  insulin  glargine (LANTUS   SOLOSTAR) 100 UNIT/ML Solostar Pen INJECT SUBCUTANEOUSLY 80 UNITS  AT NIGHT 12/11/23   Glendia Shad, MD  insulin  lispro (HUMALOG  KWIKPEN) 100 UNIT/ML KwikPen INJECT SUBCUTANEOUSLY 65  UNITS DAILY.  (Takes distributed over three meals counting carbs) 07/15/23   Glendia Shad, MD  Insulin  Pen Needle 32G X 4 MM MISC Use as directed with insulin  4 x daily. 07/15/23   Glendia Shad, MD  levocetirizine (XYZAL ) 5 MG tablet Take 1 tablet (5 mg total) by mouth every evening. 03/17/23   Glendia Shad, MD  lisinopril  (ZESTRIL ) 40 MG tablet Take 1 tablet (40 mg total) by mouth daily. 08/05/23  Glendia Shad, MD  REPATHA  SURECLICK 140 MG/ML SOAJ INJECT 1 PEN SUBCUTANEOUSLY  EVERY 2 WEEKS 12/30/23   Glendia Shad, MD  sertraline  (ZOLOFT ) 50 MG tablet TAKE 1 TABLET BY MOUTH DAILY 03/03/23   Glendia Shad, MD     Critical care time:  31 minutes    Belva November, MD [facility] 01/07/2024 2:33 PM

## 2024-01-07 NOTE — Progress Notes (Signed)
 Pt capillary blood sugar is357. Once re checked through the CL BS actually 118. Discussed with NP. Awating for Serum Lab results.

## 2024-01-07 NOTE — [place] FL2 (Signed)
 [place]  MEDICAID FL2 LEVEL OF CARE FORM     IDENTIFICATION  Patient Name: Jill Crosby Birthdate: [birth year] Sex: female Admission Date (Current Location): 01/03/2024  [place] and [place] Number:  Chiropodist and Address:  [facility], [address], [place], [place] [address]      Provider Number: [id number]  Attending Physician Name and Address:  Isadora Hose, MD  Relative Name and Phone Number:       Current Level of Care: Hospital Recommended Level of Care: Skilled Nursing Facility Prior Approval Number:    Date Approved/Denied:   PASRR Number: Manual review  Discharge Plan: SNF    Current Diagnoses: Patient Active Problem List   Diagnosis Date Noted   Hyponatremia 01/03/2024   Knee pain, bilateral 08/05/2023   Abscessed tooth 05/10/2023   Back pain 03/22/2023   Type 1 diabetes mellitus with hyperglycemia (HCC) 01/11/2022   Muscle cramps 01/11/2022   Abnormal mammogram 04/15/2020   Vitamin D  deficiency 06/27/2019   Nasal lesion 08/08/2018   BMI 38.0-38.9,adult 05/17/2017   Nasal congestion 11/09/2016   Dizziness 11/09/2016   Health care maintenance 12/30/2015   Essential hypertension 12/30/2015   B12 deficiency 08/29/2014   History of colonic polyps 01/07/2014   Tobacco use 10/02/2013   Skin lesion 10/02/2013   Hypercholesterolemia 03/25/2013   Anxiety 03/25/2013    Orientation RESPIRATION BLADDER Height & Weight     Self, Time, Situation, Place  O2, Other (Comment) (Nasal Cannala 4 L. Weaning as tolerated. Bipap QHS: 14/8 at 40%.) Incontinent Weight: 257 lb 11.5 oz (116.9 kg) Height:  5' 4 (162.6 cm)  BEHAVIORAL SYMPTOMS/MOOD NEUROLOGICAL BOWEL NUTRITION STATUS   (None)  (None) Incontinent Diet (Carb modified)  AMBULATORY STATUS COMMUNICATION OF NEEDS Skin   Extensive Assist Verbally Normal                       Personal Care Assistance Level of Assistance  Bathing, Feeding, Dressing Bathing  Assistance: Maximum assistance Feeding assistance: Limited assistance Dressing Assistance: Maximum assistance     Functional Limitations Info  Sight, Hearing, Speech Sight Info: Adequate Hearing Info: Adequate Speech Info: Adequate    SPECIAL CARE FACTORS FREQUENCY  PT (By licensed PT), OT (By licensed OT)     PT Frequency: 5 x week OT Frequency: 5 x week            Contractures Contractures Info: Not present    Additional Factors Info  Code Status, Allergies, Isolation Precautions Code Status Info: Full code Allergies Info: Codeine     Isolation Precautions Info: Droplet: Positive for flu A on 2/2.     Current Medications (01/07/2024):  This is the current hospital active medication list Current Facility-Administered Medications  Medication Dose Route Frequency Provider Last Rate Last Admin   azithromycin  (ZITHROMAX ) 500 mg in sodium chloride  0.9 % 250 mL IVPB  500 mg Intravenous Q24H Isadora Hose, MD   Stopped at 01/07/24 0844   cefTRIAXone  (ROCEPHIN ) 1 g in sodium chloride  0.9 % 100 mL IVPB  1 g Intravenous Q24H Isadora Hose, MD   Stopped at 01/07/24 0729   Chlorhexidine  Gluconate Cloth 2 % PADS 6 each  6 each Topical Daily Isadora Hose, MD   6 each at 01/07/24 0245   dextrose  50 % solution 0-50 mL  0-50 mL Intravenous PRN Ouma, Elizabeth Achieng, NP       enoxaparin  (LOVENOX ) injection 60 mg  60 mg Subcutaneous Q24H Isadora Hose, MD  60 mg at 01/06/24 2200   hydrALAZINE  (APRESOLINE ) injection 10 mg  10 mg Intravenous Q6H PRN Keene, Jeremiah D, NP   10 mg at 01/05/24 1159   insulin  aspart (novoLOG ) injection 0-15 Units  0-15 Units Subcutaneous TID WC Nelson, Dana G, NP       insulin  aspart (novoLOG ) injection 0-5 Units  0-5 Units Subcutaneous QHS Nelson, Dana G, NP       insulin  glargine-yfgn (SEMGLEE ) injection 12 Units  12 Units Subcutaneous BID Isadora Hose, MD       NOREEN ON 01/08/2024] multivitamin with minerals tablet 1 tablet  1 tablet Oral Daily Dgayli,  Hose, MD       nicotine  (NICODERM CQ  - dosed in mg/24 hours) patch 14 mg  14 mg Transdermal Daily Keene, Jeremiah D, NP   14 mg at 01/07/24 9071   Oral care mouth rinse  15 mL Mouth Rinse 4 times per day Isadora Hose, MD   15 mL at 01/07/24 1552   Oral care mouth rinse  15 mL Mouth Rinse PRN Isadora Hose, MD       oseltamivir  (TAMIFLU ) capsule 75 mg  75 mg Oral BID Chappell, Alex B, RPH   75 mg at 01/07/24 0925   polyethylene glycol (MIRALAX  / GLYCOLAX ) packet 17 g  17 g Oral Daily PRN Parris Manna, MD       protein supplement (ENSURE MAX) liquid  11 oz Oral BID Isadora Hose, MD       sodium chloride  (hypertonic) 3 % solution   Intravenous Continuous Lateef, Munsoor, MD 25 mL/hr at 01/07/24 1514 Infusion Verify at 01/07/24 1514   sodium chloride  flush (NS) 0.9 % injection 10-40 mL  10-40 mL Intracatheter Q12H Kathrene Almarie Bake, NP   30 mL at 01/07/24 9073     Discharge Medications: Please see discharge summary for a list of discharge medications.  Relevant Imaging Results:  Relevant Lab Results:   Additional Information SS#: [id number]  Lauraine JAYSON Carpen, LCSW

## 2024-01-07 NOTE — TOC Initial Note (Signed)
 Transition of Care [facility]) - Initial/Assessment Note    Patient Details  Name: Jill Crosby MRN: [id number] Date of Birth: Jan 06, 1961  [facility]) CM/SW Contact:    Lauraine JAYSON Carpen, LCSW Phone Number: 01/07/2024, 5:05 PM  Clinical Narrative:   CSW met with patient. Mother at bedside. CSW introduced role and explained that therapy recommendations would be discussed. Patient is agreeable to SNF placement. PASARR under manual review. Uploaded clinicals into [place] [facility]. No further concerns. CSW will continue to follow patient for support and facilitate discharge to SNF once medically stable.               Expected Discharge Plan: Skilled Nursing Facility Barriers to Discharge: Continued Medical Work up, [employer], [employer])   Patient Goals and CMS Choice            Expected Discharge Plan and Services     Post Acute Care Choice: Skilled Nursing Facility Living arrangements for the past 2 months: Single Family Home                                      Prior Living Arrangements/Services Living arrangements for the past 2 months: Single Family Home Lives with:: Adult Children Patient language and need for interpreter reviewed:: Yes Do you feel safe going back to the place where you live?: Yes      Need for Family Participation in Patient Care: Yes (Comment) Care giver support system in place?: Yes (comment)   Criminal Activity/Legal Involvement Pertinent to Current Situation/Hospitalization: No - Comment as needed  Activities of Daily Living      Permission Sought/Granted Permission sought to share information with : Facility Medical Sales Representative, Family Supports Permission granted to share information with : Yes, Verbal Permission Granted  Share Information with NAME: Donny Gosling and Mardeen Mayer  Permission granted to share info w AGENCY: SNF's  Permission granted to share info w Relationship: Sister and mother  Permission granted to  share info w Contact Information: Donny: [phone]: [phone]  Emotional Assessment Appearance:: Appears stated age Attitude/Demeanor/Rapport: Engaged, Gracious Affect (typically observed): Accepting, Appropriate, Calm, Pleasant Orientation: : Oriented to Self, Oriented to Place, Oriented to  Time, Oriented to Situation Alcohol / Substance Use: Not Applicable Psych Involvement: No (comment)  Admission diagnosis:  Hyponatremia [E87.1] Acute respiratory failure with hypoxia (HCC) [J96.01] Patient Active Problem List   Diagnosis Date Noted   Hyponatremia 01/03/2024   Knee pain, bilateral 08/05/2023   Abscessed tooth 05/10/2023   Back pain 03/22/2023   Type 1 diabetes mellitus with hyperglycemia (HCC) 01/11/2022   Muscle cramps 01/11/2022   Abnormal mammogram 04/15/2020   Vitamin D  deficiency 06/27/2019   Nasal lesion 08/08/2018   BMI 38.0-38.9,adult 05/17/2017   Nasal congestion 11/09/2016   Dizziness 11/09/2016   Health care maintenance 12/30/2015   Essential hypertension 12/30/2015   B12 deficiency 08/29/2014   History of colonic polyps 01/07/2014   Tobacco use 10/02/2013   Skin lesion 10/02/2013   Hypercholesterolemia 03/25/2013   Anxiety 03/25/2013   PCP:  Glendia Shad, MD Pharmacy:   [facility]) - [place], [place] - [address] [facility] [address] [place] [address] [place]  [address] Phone: [phone] Fax: [phone]  [id number] - [place], [place] - [address] [place] [address] Phone: [phone] Fax: [phone]  [facility] - [place],  [place] - [address] [place] [address] Phone: [phone] Fax: [phone]     Social Drivers of Health (SDOH) Social History: SDOH Screenings   Food Insecurity: Patient Unable To Answer (01/03/2024)  Housing: Patient Unable To Answer (01/03/2024)  Transportation Needs: Patient Unable To Answer  (01/03/2024)  Utilities: Patient Unable To Answer (01/03/2024)  Depression (PHQ2-9): Low Risk  (08/05/2023)  Social Connections: Patient Unable To Answer (01/03/2024)  Tobacco Use: Medium Risk (01/03/2024)   SDOH Interventions:     Readmission Risk Interventions     No data to display

## 2024-01-07 NOTE — Plan of Care (Signed)
  Problem: Education: Goal: Knowledge of General Education information will improve Description: Including pain rating scale, medication(s)/side effects and non-pharmacologic comfort measures Outcome: Progressing   Problem: Health Behavior/Discharge Planning: Goal: Ability to manage health-related needs will improve Outcome: Progressing   Problem: Clinical Measurements: Goal: Ability to maintain clinical measurements within normal limits will improve Outcome: Progressing Goal: Will remain free from infection Outcome: Progressing Goal: Diagnostic test results will improve Outcome: Progressing Goal: Respiratory complications will improve Outcome: Progressing Goal: Cardiovascular complication will be avoided Outcome: Progressing   Problem: Activity: Goal: Risk for activity intolerance will decrease Outcome: Progressing   Problem: Nutrition: Goal: Adequate nutrition will be maintained Outcome: Progressing   Problem: Coping: Goal: Level of anxiety will decrease Outcome: Progressing   Problem: Elimination: Goal: Will not experience complications related to bowel motility Outcome: Progressing Goal: Will not experience complications related to urinary retention Outcome: Progressing   Problem: Pain Managment: Goal: General experience of comfort will improve and/or be controlled Outcome: Progressing   Problem: Safety: Goal: Ability to remain free from injury will improve Outcome: Progressing   Problem: Skin Integrity: Goal: Risk for impaired skin integrity will decrease Outcome: Progressing   Problem: Education: Goal: Ability to describe self-care measures that may prevent or decrease complications (Diabetes Survival Skills Education) will improve Outcome: Progressing   Problem: Coping: Goal: Ability to adjust to condition or change in health will improve Outcome: Progressing   Problem: Fluid Volume: Goal: Ability to maintain a balanced intake and output will  improve Outcome: Progressing   Problem: Health Behavior/Discharge Planning: Goal: Ability to identify and utilize available resources and services will improve Outcome: Progressing Goal: Ability to manage health-related needs will improve Outcome: Progressing   Problem: Metabolic: Goal: Ability to maintain appropriate glucose levels will improve Outcome: Progressing   Problem: Nutritional: Goal: Maintenance of adequate nutrition will improve Outcome: Progressing Goal: Progress toward achieving an optimal weight will improve Outcome: Progressing   Problem: Skin Integrity: Goal: Risk for impaired skin integrity will decrease Outcome: Progressing   Problem: Tissue Perfusion: Goal: Adequacy of tissue perfusion will improve Outcome: Progressing   Problem: Education: Goal: Ability to describe self-care measures that may prevent or decrease complications (Diabetes Survival Skills Education) will improve Outcome: Progressing   Problem: Cardiac: Goal: Ability to maintain an adequate cardiac output will improve Outcome: Progressing   Problem: Health Behavior/Discharge Planning: Goal: Ability to identify and utilize available resources and services will improve Outcome: Progressing Goal: Ability to manage health-related needs will improve Outcome: Progressing   Problem: Fluid Volume: Goal: Ability to achieve a balanced intake and output will improve Outcome: Progressing   Problem: Metabolic: Goal: Ability to maintain appropriate glucose levels will improve Outcome: Progressing   Problem: Nutritional: Goal: Maintenance of adequate nutrition will improve Outcome: Progressing Goal: Maintenance of adequate weight for body size and type will improve Outcome: Progressing   Problem: Respiratory: Goal: Will regain and/or maintain adequate ventilation Outcome: Progressing   Problem: Urinary Elimination: Goal: Ability to achieve and maintain adequate renal perfusion and  functioning will improve Outcome: Progressing

## 2024-01-08 DIAGNOSIS — J09X2 Influenza due to identified novel influenza A virus with other respiratory manifestations: Secondary | ICD-10-CM

## 2024-01-08 DIAGNOSIS — I5033 Acute on chronic diastolic (congestive) heart failure: Secondary | ICD-10-CM

## 2024-01-08 DIAGNOSIS — E1065 Type 1 diabetes mellitus with hyperglycemia: Secondary | ICD-10-CM

## 2024-01-08 DIAGNOSIS — J9601 Acute respiratory failure with hypoxia: Secondary | ICD-10-CM | POA: Diagnosis not present

## 2024-01-08 DIAGNOSIS — E871 Hypo-osmolality and hyponatremia: Secondary | ICD-10-CM | POA: Diagnosis not present

## 2024-01-08 LAB — BASIC METABOLIC PANEL
Anion gap: 10 (ref 5–15)
BUN: 12 mg/dL (ref 8–23)
CO2: 31 mmol/L (ref 22–32)
Calcium: 8.7 mg/dL — ABNORMAL LOW (ref 8.9–10.3)
Chloride: 90 mmol/L — ABNORMAL LOW (ref 98–111)
Creatinine, Ser: 0.72 mg/dL (ref 0.44–1.00)
GFR, Estimated: >60 mL/min (ref >60)
Glucose, Bld: 140 mg/dL — ABNORMAL HIGH (ref 70–99)
Potassium: 3.5 mmol/L (ref 3.5–5.1)
Sodium: 131 mmol/L — ABNORMAL LOW (ref 135–145)

## 2024-01-08 LAB — CULTURE, BLOOD (ROUTINE X 2)
Culture: NO GROWTH
Culture: NO GROWTH
Culture: NO GROWTH
Culture: NO GROWTH

## 2024-01-08 LAB — CBC
HCT: 31.6 % — ABNORMAL LOW (ref 36.0–46.0)
Hemoglobin: 10.7 g/dL — ABNORMAL LOW (ref 12.0–15.0)
MCH: 29.3 pg (ref 26.0–34.0)
MCHC: 33.9 g/dL (ref 30.0–36.0)
MCV: 86.6 fL (ref 80.0–100.0)
Platelets: 413 10*3/uL — ABNORMAL HIGH (ref 150–400)
RBC: 3.65 MIL/uL — ABNORMAL LOW (ref 3.87–5.11)
RDW: 13.5 % (ref 11.5–15.5)
WBC: 15 10*3/uL — ABNORMAL HIGH (ref 4.0–10.5)
nRBC: 0 % (ref 0.0–0.2)

## 2024-01-08 LAB — GLUCOSE, CAPILLARY
Glucose-Capillary: 116 mg/dL — ABNORMAL HIGH (ref 70–99)
Glucose-Capillary: 186 mg/dL — ABNORMAL HIGH (ref 70–99)
Glucose-Capillary: 164 mg/dL — ABNORMAL HIGH (ref 70–99)
Glucose-Capillary: 355 mg/dL — ABNORMAL HIGH (ref 70–99)
Glucose-Capillary: 231 mg/dL — ABNORMAL HIGH (ref 70–99)
Glucose-Capillary: 163 mg/dL — ABNORMAL HIGH (ref 70–99)

## 2024-01-08 LAB — SODIUM
Sodium: 130 mmol/L — ABNORMAL LOW (ref 135–145)
Sodium: 131 mmol/L — ABNORMAL LOW (ref 135–145)
Sodium: 130 mmol/L — ABNORMAL LOW (ref 135–145)

## 2024-01-08 LAB — PHOSPHORUS: Phosphorus: 4 mg/dL (ref 2.5–4.6)

## 2024-01-08 LAB — MAGNESIUM: Magnesium: 2.2 mg/dL (ref 1.7–2.4)

## 2024-01-08 MED ORDER — AMLODIPINE BESYLATE 10 MG PO TABS
10.0000 mg | ORAL_TABLET | Freq: Every day | ORAL | Status: DC
  Administered 2024-01-08 – 2024-01-12 (×5): 10 mg via ORAL
  Filled 2024-01-08 (×5): qty 1

## 2024-01-08 MED ORDER — POTASSIUM CHLORIDE 10 MEQ/50ML IV SOLN
10.0000 meq | INTRAVENOUS | Status: AC
  Administered 2024-01-08 (×3): 10 meq via INTRAVENOUS
  Filled 2024-01-08 (×3): qty 50

## 2024-01-08 MED ORDER — SODIUM CHLORIDE 0.9 % IV SOLN
500.0000 mg | Freq: Once | INTRAVENOUS | Status: AC
  Administered 2024-01-08: 500 mg via INTRAVENOUS
  Filled 2024-01-08: qty 5

## 2024-01-08 MED ORDER — FUROSEMIDE 10 MG/ML IJ SOLN
40.0000 mg | Freq: Once | INTRAMUSCULAR | Status: AC
  Administered 2024-01-08: 40 mg via INTRAVENOUS
  Filled 2024-01-08: qty 4

## 2024-01-08 NOTE — Progress Notes (Signed)
 Physical Therapy Treatment Patient Details Name: Jill Crosby MRN: [id number] DOB: Aug 31, 1961 Today's Date: 01/08/2024   History of Present Illness Pt is a 63 y.o. female presenting with severe hyponatremia and altered mental status. Patient is flu positive, extubated 2/4. PMH significant for T1DM, obesity, HTN.    PT Comments  Patient making progress towards physical therapy goals. Patient functioning at [facility] level at this time with RW. Ambulated 25' + 15' with RW and CGA + chair follow for safety. On 4L O2 during mobility, spO2 >89% throughout session. Patient presents with decreased activity tolerance. Sister present and supportive throughout. Sister reporting that patient is limited household ambulator and typically doesn't walk far bouts in the home. Sons pick up groceries or they are delivered. Discharge plan remains appropriate at this time. Patient may progress to being able to discharge home with [facility] services if family available to stay with her 24/7.    If plan is discharge home, recommend the following: A little help with walking and/or transfers;A little help with bathing/dressing/bathroom;Assistance with cooking/housework;Assist for transportation;Help with stairs or ramp for entrance   Can travel by private vehicle     No  Equipment Recommendations  Rolling Kasra Melvin (2 wheels);BSC/3in1    Recommendations for Other Services       Precautions / Restrictions Precautions Precautions: Fall Restrictions Weight Bearing Restrictions Per Provider Order: No     Mobility  Bed Mobility Overal bed mobility: Needs Assistance Bed Mobility: Supine to Sit     Supine to sit: Contact guard, HOB elevated          Transfers Overall transfer level: Needs assistance Equipment used: Rolling Kelita Wallis (2 wheels) Transfers: Sit to/from Stand Sit to Stand: Contact guard assist   Step pivot transfers: Contact guard assist       General transfer comment: performed step pivot transfer  to recliner then to [facility].    Ambulation/Gait Ambulation/Gait assistance: Contact guard assist Gait Distance (Feet): 25 Feet (+15') Assistive device: Rolling Hawraa Stambaugh (2 wheels) Gait Pattern/deviations: Step-to pattern, Decreased stride length, Trunk flexed Gait velocity: decreased     General Gait Details: CGA for safety + chair follow. Able to ambulate 25' prior to sitting for rest break and ambulated another 15'. SpO2 drop to 89% after first bout on 4L O2   Stairs             Wheelchair Mobility     Tilt Bed    Modified Rankin (Stroke Patients Only)       Balance Overall balance assessment: Needs assistance Sitting-balance support: Feet supported Sitting balance-Leahy Scale: Good     Standing balance support: Reliant on assistive device for balance, During functional activity Standing balance-Leahy Scale: Fair                              Cognition Arousal: Alert Behavior During Therapy: WFL for tasks assessed/performed Overall Cognitive Status: Impaired/Different from baseline Area of Impairment: Problem solving, Following commands                       Following Commands: Follows one step commands consistently     Problem Solving: Slow processing          Exercises      General Comments General comments (skin integrity, edema, etc.): SpO2 >89% throughout on 4L O2      Pertinent Vitals/Pain Pain Assessment Pain Assessment: No/denies pain    Home Living  Prior Function            PT Goals (current goals can now be found in the care plan section) Acute Rehab PT Goals Patient Stated Goal: to go home PT Goal Formulation: With patient Time For Goal Achievement: 01/20/24 Potential to Achieve Goals: Good Progress towards PT goals: Progressing toward goals    Frequency    Min 1X/week      PT Plan      Co-evaluation              AM-PAC PT 6 Clicks Mobility   Outcome  Measure  Help needed turning from your back to your side while in a flat bed without using bedrails?: A Little Help needed moving from lying on your back to sitting on the side of a flat bed without using bedrails?: A Little Help needed moving to and from a bed to a chair (including a wheelchair)?: A Little Help needed standing up from a chair using your arms (e.g., wheelchair or bedside chair)?: A Little Help needed to walk in hospital room?: A Little Help needed climbing 3-5 steps with a railing? : A Lot 6 Click Score: 17    End of Session Equipment Utilized During Treatment: Oxygen Activity Tolerance: Patient tolerated treatment well Patient left: in chair;with call bell/phone within reach;with nursing/sitter in room Nurse Communication: Mobility status PT Visit Diagnosis: Other abnormalities of gait and mobility (R26.89);Difficulty in walking, not elsewhere classified (R26.2);Muscle weakness (generalized) (M62.81)     Time: 8960-8874 PT Time Calculation (min) (ACUTE ONLY): 46 min  Charges:    $Therapeutic Activity: 38-52 mins PT General Charges $$ ACUTE PT VISIT: 1 Visit                     Jill Crosby, PT, DPT Physical Therapist - [facility]  [facility]    Jill Crosby 01/08/2024, 2:13 PM

## 2024-01-08 NOTE — TOC Progression Note (Signed)
 Transition of Care [facility]) - Progression Note    Patient Details  Name: ARRIYAH MADEJ MRN: [id number] Date of Birth: Nov 06, 1961  [facility] Care [facility]) CM/SW Contact  Lauraine JAYSON Carpen, LCSW Phone Number: 01/08/2024, 3:28 PM  Clinical Narrative:   PASARR obtained: [id number] E. Expires 3/9.  Expected Discharge Plan: Skilled Nursing Facility Barriers to Discharge: Continued Medical Work up, Awaiting State Approval FINANCIAL CONTROLLER)  Expected Discharge Plan and Services     Post Acute Care Choice: Skilled Nursing Facility Living arrangements for the past 2 months: Single Family Home                                       Social Determinants of Health (SDOH) Interventions SDOH Screenings   Food Insecurity: Patient Unable To Answer (01/03/2024)  Housing: Patient Unable To Answer (01/03/2024)  Transportation Needs: Patient Unable To Answer (01/03/2024)  Utilities: Patient Unable To Answer (01/03/2024)  Depression (PHQ2-9): Low Risk  (08/05/2023)  Social Connections: Patient Unable To Answer (01/03/2024)  Tobacco Use: Medium Risk (01/03/2024)    Readmission Risk Interventions     No data to display

## 2024-01-08 NOTE — Progress Notes (Signed)
 NAME:  Jill Crosby, MRN:  [id number], DOB:  Sep 11, 1961, LOS: 5 ADMISSION DATE:  01/03/2024, CHIEF COMPLAINT:  Hyponatremia, Encephalopathy   History of Present Illness:   This is 63 year old female with a history of type 1 diabetes since age 14, previously on insulin  pump which was switched to subcutaneous injections years ago.  She had an URTI recently with respiratory symptoms and worsening encephalopathy prompting presentation to the hospital.  History was obtained from family members who provided collateral information given intubation and mechanical ventilation. Additional background medical history includes essential hypertension, dyslipidemia, anxiety disorder, history of smoking, arthritis, morbid obesity and glaucoma. She presents with N/V and altered mental status after coming down with a cold over a week ago. She tested positive for influenza A in the ED. BMP showed hyperglycemia at 300 and severe hyponatremia and undetectably low levels less than 102 with hypochloremia.  She was encephalopathic and agitated and was intubated in the ED. PCCM was consulted for admission for management of encephalopathy, respiratory failure, and hyponatremia.  Pertinent  Medical History  -T1DM -HTN -HLD -Obesity  Significant Hospital Events: Including procedures, antibiotic start and stop dates in addition to other pertinent events   01/03/2024: admit to hospital, intubated, severe hyponatremia 01/04/2024: remains intubated, follows commands. Sodium 109-111 01/05/2024: passed SBT, followed commands, sodium improved, extubated to BiPAP 01/06/2024: awake and interactive on nasal cannula. Sodium improving on hypertonic saline 01/07/2024: mental status notably better, on nasal cannula, remains hypoxic. 01/08/2024: sodium continues to improve, mental status improved, remains on oxygen  Interim History / Subjective:  Denies pain, in chair having breakfast. Sodium improving.  Objective   Blood pressure (!)  158/57, pulse 74, temperature 99.3 F (37.4 C), temperature source Oral, resp. rate 13, height 5' 4 (1.626 m), weight 110.2 kg, SpO2 100%.    FiO2 (%):  [40 %] 40 %   Intake/Output Summary (Last 24 hours) at 01/08/2024 0648 Last data filed at 01/08/2024 9365 Gross per 24 hour  Intake 1352.59 ml  Output 2000 ml  Net -647.41 ml   Filed Weights   01/06/24 0500 01/07/24 0500 01/08/24 0446  Weight: 119.8 kg 116.9 kg 110.2 kg    Examination: Physical Exam Constitutional:      General: She is not in acute distress.    Appearance: She is obese. She is not ill-appearing.  Cardiovascular:     Rate and Rhythm: Normal rate and regular rhythm.     Heart sounds: Normal heart sounds.  Pulmonary:     Breath sounds: Rales (bibasilar rales on posterior auscultation) present. No wheezing.  Neurological:     Mental Status: She is oriented to person, place, and time. Mental status is at baseline.      Assessment & Plan:   #Acute Hypoxic Respiratory Failure #Severe Symptomatic Hyponatremia #Toxic Metabolic Encephalopathy #Severe Hyperglycemia without DKA #Influenza A infection #Acute on Chronic HFpEF  63 year old female with a history of T1DM presents with a few days history of URTI found to have influenza pneumonia and toxic metabolic encephalopathy secondary to severe hyponatremia. This is likely to osmotic diuresis, hydrochlorothiazide  use, SSRI use, and decreased PO intake. She was intubated for respiratory failure and encephalopathy and now extubated with improvement in respiratory status and mental status.  Neuro - discontinued analgosedation and psychotropic medications. Mental status improved. Holding SSRI given hyponatremia. Sodium level is improved.  CV - required vasopressor support secondary to sedation related hypotension, now discontinued. Lactic acid within normal and is overall well perfused. Elevated BNP  with signs of volume overload suggests heart failure, TTE with signs of  HFpEF. Administering PRN diuresis for decompensated heart failure.  Pulm - Respiratory failure following influenza infection with chest xray showing increased interstitial markings. This could represent findings secondary to influenza vs findings of decompensated heart failure given elevated BNP. Extubated to BiPAP after passing SBT and is now on nasal cannula. Will continue with nocturnal BiPAP. She remains hypoxic and has bibasilar crackles on exam. Will diurese today with 40 mg of IV furosemide .  GI - Diet restarted.  Renal - severe hyponatremia, initially < 102. We have corrected her sodium by 6 to 8 mEq/day and her most recent check shows it at 130. Given normalization, will discontinue her hypertonic saline and increase salt intake in diet. Appreciate input from nephrology.  Endo - switched to basal bolus regimen of insulin  given T1DM.  Hem/Onc - heparin  subQ for DVT prophylaxis  ID - influenza infection on tamiflu . Will continue antibiotics for CAP coverage.   Best Practice (right click and Reselect all SmartList Selections daily)   Diet/type: NPO DVT prophylaxis prophylactic heparin   Pressure ulcer(s): N/A GI prophylaxis: H2B Lines: Central line, Arterial Line, and yes and it is still needed Foley:  Yes, and it is still needed Code Status:  full code Last date of multidisciplinary goals of care discussion [01/08/2024]  Labs   CBC: Recent Labs  Lab 01/03/24 1633 01/03/24 2036 01/04/24 0413 01/05/24 0505 01/06/24 0528 01/07/24 0503 01/08/24 0524  WBC 23.1*   < > 21.3* 30.0* 21.2* 15.0* 15.0*  NEUTROABS 20.6*  --   --   --   --   --   --   HGB 12.0   < > 10.9* 10.3* 10.4* 10.4* 10.7*  HCT RESULTS UNAVAILABLE DUE TO INTERFERING SUBSTANCE   < > 29.2* 28.2* 28.7* 30.1* 31.6*  MCV RESULTS UNAVAILABLE DUE TO INTERFERING SUBSTANCE   < > 79.6* 82.0 82.9 85.5 86.6  PLT 323   < > 299 340 367 382 413*   < > = values in this interval not displayed.    Basic Metabolic  Panel: Recent Labs  Lab 01/04/24 0413 01/04/24 0532 01/05/24 0505 01/05/24 0935 01/06/24 9471 01/06/24 0645 01/07/24 0503 01/07/24 0631 01/07/24 1325 01/07/24 1722 01/07/24 2146 01/08/24 0128 01/08/24 0524  NA 108*   < > 110*   < > 120*   < > 125*   < > 126* 127* 127*  127* 131* 130*  K 3.8  --  4.7  --  3.5  --  3.2*  --   --   --  3.4* 3.5  --   CL 74*  --  77*  --  84*  --  86*  --   --   --  87* 90*  --   CO2 27  --  26  --  29  --  31  --   --   --  29 31  --   GLUCOSE 190*  --  260*  --  94  --  75  --   --   --  273* 140*  --   BUN 11  --  14  --  10  --  10  --   --   --  13 12  --   CREATININE 0.80  --  0.77  --  0.63  --  0.63  --   --   --  0.77 0.72  --   CALCIUM  7.8*  --  8.1*  --  8.3*  --  8.4*  --   --   --  8.7* 8.7*  --   MG 2.1  --  2.3  --  2.2  --  2.2  --   --   --   --  2.2  --   PHOS 3.3  --  2.2*  --  2.3*  --  2.9  --   --   --   --  4.0  --    < > = values in this interval not displayed.   GFR: Estimated Creatinine Clearance: 86.2 mL/min (by C-G formula based on SCr of 0.72 mg/dL). Recent Labs  Lab 01/03/24 1738 01/03/24 1840 01/03/24 2036 01/03/24 2048 01/04/24 0341 01/04/24 0413 01/05/24 0505 01/06/24 0528 01/07/24 0503 01/08/24 0524  PROCALCITON  --  0.23  --   --   --   --   --   --   --   --   WBC  --   --    < >  --   --    < > 30.0* 21.2* 15.0* 15.0*  LATICACIDVEN 1.0  --   --  1.3 1.0  --   --   --   --   --    < > = values in this interval not displayed.    Liver Function Tests: Recent Labs  Lab 01/03/24 1633 01/04/24 0413 01/05/24 0505 01/06/24 0528  AST 90* 50* 35 28  ALT 56* 41 40 33  ALKPHOS 56 49 45 49  BILITOT 1.4* 0.8 0.7 0.9  PROT 6.8 5.7* 5.7* 5.7*  ALBUMIN 3.4* 2.6* 2.5* 2.5*   Recent Labs  Lab 01/03/24 1840  LIPASE 26   No results for input(s): AMMONIA in the last 168 hours.  ABG    Component Value Date/Time   PHART 7.44 01/06/2024 0528   PCO2ART 47 01/06/2024 0528   PO2ART 85 01/06/2024 0528    HCO3 31.9 (H) 01/06/2024 0528   O2SAT 98.6 01/06/2024 0528     Coagulation Profile: No results for input(s): INR, PROTIME in the last 168 hours.  Cardiac Enzymes: No results for input(s): CKTOTAL, CKMB, CKMBINDEX, TROPONINI in the last 168 hours.  HbA1C: Hemoglobin A1C  Date/Time Value Ref Range Status  07/25/2021 12:00 AM 7.0  Final   Hgb A1c MFr Bld  Date/Time Value Ref Range Status  01/04/2024 01:06 AM 6.9 (H) 4.8 - 5.6 % Final    Comment:    (NOTE) Pre diabetes:          5.7%-6.4%  Diabetes:              >6.4%  Glycemic control for   <7.0% adults with diabetes   03/24/2023 11:07 AM 7.1 (H) 4.8 - 5.6 % Final    Comment:             Prediabetes: 5.7 - 6.4          Diabetes: >6.4          Glycemic control for adults with diabetes: <7.0     CBG: Recent Labs  Lab 01/07/24 1949 01/07/24 1955 01/07/24 2013 01/07/24 2344 01/08/24 0341  GLUCAP 357* 118* 360* 190* 116*    Review of Systems:   Unable to obtain  Past Medical History:  She,  has a past medical history of Anxiety, Depression, Elevated BP, Hypercholesterolemia, Increased BMI, Menopause, Morbid obesity (HCC), and Type I diabetes mellitus (HCC).   Surgical History:   Past Surgical  History:  Procedure Laterality Date   BREAST BIOPSY Left 05/09/2020   ribbon clip, stereo bx, HEMANGIOMA, BENIGN. - MAMMARY EPITHELIUM IS NOT PRESENT.   CATARACT EXTRACTION W/PHACO Left 11/08/2020   Procedure: CATARACT EXTRACTION PHACO AND INTRAOCULAR LENS PLACEMENT (IOC) LEFT DIABETIC 4.14 00:52.1 ;  Surgeon: Jaye Fallow, MD;  Location: [facility];  Service: Ophthalmology;  Laterality: Left;   CATARACT EXTRACTION W/PHACO Right 12/04/2020   Procedure: CATARACT EXTRACTION PHACO AND INTRAOCULAR LENS PLACEMENT (IOC) RIGHT DIABETIC 6.11 00:46.1;  Surgeon: Jaye Fallow, MD;  Location: [facility];  Service: Ophthalmology;  Laterality: Right;  Diabetic - insulin    CESAREAN SECTION  1997    COLONOSCOPY WITH PROPOFOL  N/A 01/18/2021   Procedure: COLONOSCOPY WITH PROPOFOL ;  Surgeon: Jinny Carmine, MD;  Location: [facility];  Service: Endoscopy;  Laterality: N/A;  priority 4     Social History:   reports that she has quit smoking. Her smoking use included cigarettes. She has a 10 pack-year smoking history. She has never used smokeless tobacco. She reports that she does not drink alcohol and does not use drugs.   Family History:  Her family history includes Breast cancer in her maternal aunt and maternal grandmother; Cancer in her father; Colon cancer in her maternal grandfather and paternal grandfather; Colon polyps in her mother; Diabetes in her maternal grandfather; Hyperlipidemia in her mother; Hypertension in her mother. There is no history of Heart disease or Ovarian cancer.   Allergies Allergies  Allergen Reactions   Codeine Nausea And Vomiting     Home Medications  Prior to Admission medications   Medication Sig Start Date End Date Taking? Authorizing Provider  meloxicam (MOBIC) 7.5 MG tablet Take 7.5 mg by mouth daily. 09/07/23  Yes [provider]  amLODipine  (NORVASC ) 10 MG tablet TAKE 1 TABLET BY MOUTH DAILY 08/05/23   Glendia Shad, MD  BAYER MICROLET LANCETS lancets USE AS DIRECTED TO CHECK  SUGARS 1 TO 2 TIMES A DAY 01/15/18   Glendia Shad, MD  cholecalciferol (VITAMIN D3) 25 MCG (1000 UT) tablet Take 1,000 Units by mouth daily.    [provider]  CONTOUR NEXT TEST test strip USE TO CHECK BLOOD GLUCOSE 6 TO  7 TIMES DAILY 07/10/23   Glendia Shad, MD  Cyanocobalamin  (B-12 PO) Take by mouth.    [provider]  hydrochlorothiazide  (HYDRODIURIL ) 25 MG tablet TAKE 1 TABLET BY MOUTH DAILY 03/03/23   Glendia Shad, MD  insulin  glargine (LANTUS  SOLOSTAR) 100 UNIT/ML Solostar Pen INJECT SUBCUTANEOUSLY 80 UNITS  AT NIGHT 12/11/23   Glendia Shad, MD  insulin  lispro (HUMALOG  KWIKPEN) 100 UNIT/ML KwikPen INJECT SUBCUTANEOUSLY 65  UNITS  DAILY.  (Takes distributed over three meals counting carbs) 07/15/23   Glendia Shad, MD  Insulin  Pen Needle 32G X 4 MM MISC Use as directed with insulin  4 x daily. 07/15/23   Glendia Shad, MD  levocetirizine (XYZAL ) 5 MG tablet Take 1 tablet (5 mg total) by mouth every evening. 03/17/23   Glendia Shad, MD  lisinopril  (ZESTRIL ) 40 MG tablet Take 1 tablet (40 mg total) by mouth daily. 08/05/23   Glendia Shad, MD  REPATHA  SURECLICK 140 MG/ML SOAJ INJECT 1 PEN SUBCUTANEOUSLY  EVERY 2 WEEKS 12/30/23   Glendia Shad, MD  sertraline  (ZOLOFT ) 50 MG tablet TAKE 1 TABLET BY MOUTH DAILY 03/03/23   Glendia Shad, MD     Critical care time:  32 minutes    Belva November, MD [facility] 01/08/2024 4:31 PM

## 2024-01-08 NOTE — Consult Note (Signed)
 PHARMACY CONSULT NOTE - ELECTROLYTES  Pharmacy Consult for Electrolyte Monitoring and Replacement   Recent Labs: Potassium (mmol/L)  Date Value  01/08/2024 3.5   Magnesium  (mg/dL)  Date Value  97/92/7974 2.2   Calcium  (mg/dL)  Date Value  97/92/7974 8.7 (L)   Albumin (g/dL)  Date Value  97/94/7974 2.5 (L)  03/24/2023 4.2   Phosphorus (mg/dL)  Date Value  97/92/7974 4.0   Sodium (mmol/L)  Date Value  01/08/2024 131 (L)  04/29/2023 136    Height: 5' 4 (162.6 cm) Weight: 110.2 kg (242 lb 15.2 oz) IBW/kg (Calculated) : 54.7 Estimated Creatinine Clearance: 86.2 mL/min (by C-G formula based on SCr of 0.72 mg/dL).  Assessment  Jill Crosby is a 63 y.o. female presenting with severe hyponatremia and altered mental status. Patient is flu positive. PMH significant for T1DM, obesity, HTN. Pharmacy has been consulted to monitor and replace electrolytes.  Extubated 2/4  Diet: Dysphagia 3 MIVF: Hypertonic saline stopped 2/7 Pertinent medications: N/A  Goal of Therapy: Electrolytes within normal limits  Plan:  No replacement currently indicated Re-check BMP, Mg, Phos tomorrow AM  Thank you for allowing pharmacy to be a part of this patient's care.  Lue Sykora A Jasper Ruminski, PharmD Clinical Pharmacist 01/08/2024 2:10 PM

## 2024-01-08 NOTE — Plan of Care (Signed)
 Continuing with plan of care.

## 2024-01-08 NOTE — Progress Notes (Signed)
 [facility]  ROUNDING NOTE   Subjective:   Patient doing well. Serum sodium has stabilized in the low 130s. Patient awake and alert and conversant today.  Objective:  Vital signs in last 24 hours:  Temp:  [97.6 F (36.4 C)-99.3 F (37.4 C)] 97.6 F (36.4 C) (02/07 1600) Pulse Rate:  [67-99] 86 (02/07 1900) Resp:  [12-20] 15 (02/07 1900) BP: (116-160)/(51-104) 116/104 (02/07 1500) SpO2:  [92 %-100 %] 99 % (02/07 1900) FiO2 (%):  [40 %] 40 % (02/07 0752) Weight:  [110.2 kg] 110.2 kg (02/07 0446)  Weight change: -6.7 kg Filed Weights   01/06/24 0500 01/07/24 0500 01/08/24 0446  Weight: 119.8 kg 116.9 kg 110.2 kg    Intake/Output: I/O last 3 completed shifts: In: 2042.3 [P.O.:240; I.V.:624.6; IV Piggyback:1177.7] Out: 4200 [Urine:4200]   Intake/Output this shift:  No intake/output data recorded.  Physical Exam: General: No acute distress  Head: Normocephalic, atraumatic. Moist oral mucosal membranes  Neck: Supple  Lungs:  Scattered rhonchi, normal effort  Heart: S1S2 no rubs  Abdomen:  Soft, nontender, bowel sounds present  Extremities: Trace peripheral edema.  Neurologic: Arousable  Skin: No acute rash  Access: No hemodialysis access    Basic Metabolic Panel: Recent Labs  Lab 01/04/24 0413 01/04/24 0532 01/05/24 0505 01/05/24 0935 01/06/24 9471 01/06/24 9354 01/07/24 0503 01/07/24 9368 01/07/24 1722 01/07/24 2146 01/08/24 0128 01/08/24 0524 01/08/24 1149  NA 108*   < > 110*   < > 120*   < > 125*   < > 127* 127*  127* 131* 130* 131*  K 3.8  --  4.7  --  3.5  --  3.2*  --   --  3.4* 3.5  --   --   CL 74*  --  77*  --  84*  --  86*  --   --  87* 90*  --   --   CO2 27  --  26  --  29  --  31  --   --  29 31  --   --   GLUCOSE 190*  --  260*  --  94  --  75  --   --  273* 140*  --   --   BUN 11  --  14  --  10  --  10  --   --  13 12  --   --   CREATININE 0.80  --  0.77  --  0.63  --  0.63  --   --  0.77 0.72  --   --   CALCIUM  7.8*  --   8.1*  --  8.3*  --  8.4*  --   --  8.7* 8.7*  --   --   MG 2.1  --  2.3  --  2.2  --  2.2  --   --   --  2.2  --   --   PHOS 3.3  --  2.2*  --  2.3*  --  2.9  --   --   --  4.0  --   --    < > = values in this interval not displayed.    Liver Function Tests: Recent Labs  Lab 01/03/24 1633 01/04/24 0413 01/05/24 0505 01/06/24 0528  AST 90* 50* 35 28  ALT 56* 41 40 33  ALKPHOS 56 49 45 49  BILITOT 1.4* 0.8 0.7 0.9  PROT 6.8 5.7* 5.7* 5.7*  ALBUMIN 3.4* 2.6* 2.5*  2.5*   Recent Labs  Lab 01/03/24 1840  LIPASE 26   No results for input(s): AMMONIA in the last 168 hours.  CBC: Recent Labs  Lab 01/03/24 1633 01/03/24 2036 01/04/24 0413 01/05/24 0505 01/06/24 0528 01/07/24 0503 01/08/24 0524  WBC 23.1*   < > 21.3* 30.0* 21.2* 15.0* 15.0*  NEUTROABS 20.6*  --   --   --   --   --   --   HGB 12.0   < > 10.9* 10.3* 10.4* 10.4* 10.7*  HCT RESULTS UNAVAILABLE DUE TO INTERFERING SUBSTANCE   < > 29.2* 28.2* 28.7* 30.1* 31.6*  MCV RESULTS UNAVAILABLE DUE TO INTERFERING SUBSTANCE   < > 79.6* 82.0 82.9 85.5 86.6  PLT 323   < > 299 340 367 382 413*   < > = values in this interval not displayed.    Cardiac Enzymes: No results for input(s): CKTOTAL, CKMB, CKMBINDEX, TROPONINI in the last 168 hours.  BNP: Invalid input(s): POCBNP  CBG: Recent Labs  Lab 01/07/24 2344 01/08/24 0341 01/08/24 0734 01/08/24 1147 01/08/24 1537  GLUCAP 190* 116* 186* 164* 355*    Microbiology: Results for orders placed or performed during the hospital encounter of 01/03/24  Resp panel by RT-PCR (RSV, Flu A&B, Covid) Anterior Nasal Swab     Status: Abnormal   Collection Time: 01/03/24  2:52 PM   Specimen: Anterior Nasal Swab  Result Value Ref Range Status   SARS Coronavirus 2 by RT PCR NEGATIVE NEGATIVE Final    Comment: (NOTE) SARS-CoV-2 target nucleic acids are NOT DETECTED.  The SARS-CoV-2 RNA is generally detectable in upper respiratory specimens during the acute phase of  infection. The lowest concentration of SARS-CoV-2 viral copies this assay can detect is 138 copies/mL. A negative result does not preclude SARS-Cov-2 infection and should not be used as the sole basis for treatment or other patient management decisions. A negative result may occur with  improper specimen collection/handling, submission of specimen other than nasopharyngeal swab, presence of viral mutation(s) within the areas targeted by this assay, and inadequate number of viral copies(<138 copies/mL). A negative result must be combined with clinical observations, patient history, and epidemiological information. The expected result is Negative.  Fact Sheet for Patients:  bloggercourse.com  Fact Sheet for Healthcare Providers:  seriousbroker.it  This test is no t yet approved or cleared by the [place]  FDA and  has been authorized for detection and/or diagnosis of SARS-CoV-2 by FDA under an Emergency Use Authorization (EUA). This EUA will remain  in effect (meaning this test can be used) for the duration of the COVID-19 declaration under Section 564(b)(1) of the Act, 21 U.S.C.section 360bbb-3(b)(1), unless the authorization is terminated  or revoked sooner.       Influenza A by PCR POSITIVE (A) NEGATIVE Final   Influenza B by PCR NEGATIVE NEGATIVE Final    Comment: (NOTE) The Xpert Xpress SARS-CoV-2/FLU/RSV plus assay is intended as an aid in the diagnosis of influenza from Nasopharyngeal swab specimens and should not be used as a sole basis for treatment. Nasal washings and aspirates are unacceptable for Xpert Xpress SARS-CoV-2/FLU/RSV testing.  Fact Sheet for Patients: bloggercourse.com  Fact Sheet for Healthcare Providers: seriousbroker.it  This test is not yet approved or cleared by the [place]  FDA and has been authorized for detection and/or diagnosis of  SARS-CoV-2 by FDA under an Emergency Use Authorization (EUA). This EUA will remain in effect (meaning this test can be used) for the duration of the COVID-19  declaration under Section 564(b)(1) of the Act, 21 U.S.C. section 360bbb-3(b)(1), unless the authorization is terminated or revoked.     Resp Syncytial Virus by PCR NEGATIVE NEGATIVE Final    Comment: (NOTE) Fact Sheet for Patients: bloggercourse.com  Fact Sheet for Healthcare Providers: seriousbroker.it  This test is not yet approved or cleared by the [place]  FDA and has been authorized for detection and/or diagnosis of SARS-CoV-2 by FDA under an Emergency Use Authorization (EUA). This EUA will remain in effect (meaning this test can be used) for the duration of the COVID-19 declaration under Section 564(b)(1) of the Act, 21 U.S.C. section 360bbb-3(b)(1), unless the authorization is terminated or revoked.  Performed at [facility] [address] [place], [place] [address]   Blood culture (routine x 2)     Status: None   Collection Time: 01/03/24  5:43 PM   Specimen: BLOOD LEFT HAND  Result Value Ref Range Status   Specimen Description BLOOD LEFT HAND  Final   Special Requests   Final    BOTTLES DRAWN AEROBIC AND ANAEROBIC Blood Culture results may not be optimal due to an inadequate volume of blood received in culture bottles   Culture   Final    NO GROWTH 5 DAYS Performed at [facility], [address] [place], [place] [address]    Report Status 01/08/2024 FINAL  Final  Blood culture (routine x 2)     Status: None   Collection Time: 01/03/24  6:40 PM   Specimen: BLOOD RIGHT HAND  Result Value Ref Range Status   Specimen Description BLOOD RIGHT HAND  Final   Special Requests   Final    BOTTLES DRAWN AEROBIC AND ANAEROBIC Blood Culture results may not be optimal due to an inadequate volume of blood received in culture bottles   Culture    Final    NO GROWTH 5 DAYS Performed at [facility], [address] [place], [place] [address]    Report Status 01/08/2024 FINAL  Final  Culture, blood (Routine X 2) w Reflex to ID Panel     Status: None   Collection Time: 01/03/24  8:36 PM   Specimen: BLOOD  Result Value Ref Range Status   Specimen Description BLOOD RIGHT HAND  Final   Special Requests   Final    BOTTLES DRAWN AEROBIC ONLY Blood Culture results may not be optimal due to an inadequate volume of blood received in culture bottles   Culture   Final    NO GROWTH 5 DAYS Performed at [facility], [address] [place], [place] [address]    Report Status 01/08/2024 FINAL  Final  Culture, blood (Routine X 2) w Reflex to ID Panel     Status: None   Collection Time: 01/03/24  8:40 PM   Specimen: BLOOD  Result Value Ref Range Status   Specimen Description BLOOD BLOOD LEFT HAND  Final   Special Requests   Final    BOTTLES DRAWN AEROBIC AND ANAEROBIC Blood Culture results may not be optimal due to an inadequate volume of blood received in culture bottles   Culture   Final    NO GROWTH 5 DAYS Performed at [facility], [address], [place], [place] [address]    Report Status 01/08/2024 FINAL  Final  MRSA Next Gen by PCR, Nasal     Status: None   Collection Time: 01/03/24  8:45 PM   Specimen: Nasal Mucosa; Nasal Swab  Result Value Ref  Range Status   MRSA by PCR Next Gen NOT DETECTED NOT DETECTED Final    Comment: (NOTE) The GeneXpert MRSA Assay (FDA approved for NASAL specimens only), is one component of a comprehensive MRSA colonization surveillance program. It is not intended to diagnose MRSA infection nor to guide or monitor treatment for MRSA infections. Test performance is not FDA approved in patients less than 2 years old. Performed at [facility], [address] [place], [place] [address]   Respiratory (~20 pathogens) panel by PCR     Status: Abnormal   Collection  Time: 01/04/24  5:28 PM   Specimen: Nasopharyngeal Swab; Respiratory  Result Value Ref Range Status   Adenovirus NOT DETECTED NOT DETECTED Final   Coronavirus 229E NOT DETECTED NOT DETECTED Final    Comment: (NOTE) The Coronavirus on the Respiratory Panel, DOES NOT test for the novel  Coronavirus (2019 nCoV)    Coronavirus HKU1 NOT DETECTED NOT DETECTED Final   Coronavirus NL63 NOT DETECTED NOT DETECTED Final   Coronavirus OC43 NOT DETECTED NOT DETECTED Final   Metapneumovirus NOT DETECTED NOT DETECTED Final   Rhinovirus / Enterovirus NOT DETECTED NOT DETECTED Final   Influenza A H1 2009 DETECTED (A) NOT DETECTED Final   Influenza B NOT DETECTED NOT DETECTED Final   Parainfluenza Virus 1 NOT DETECTED NOT DETECTED Final   Parainfluenza Virus 2 NOT DETECTED NOT DETECTED Final   Parainfluenza Virus 3 NOT DETECTED NOT DETECTED Final   Parainfluenza Virus 4 NOT DETECTED NOT DETECTED Final   Respiratory Syncytial Virus NOT DETECTED NOT DETECTED Final   Bordetella pertussis NOT DETECTED NOT DETECTED Final   Bordetella Parapertussis NOT DETECTED NOT DETECTED Final   Chlamydophila pneumoniae NOT DETECTED NOT DETECTED Final   Mycoplasma pneumoniae NOT DETECTED NOT DETECTED Final    Comment: Performed at [facility], [address] [place], [place] [address]    Coagulation Studies: No results for input(s): LABPROT, INR in the last 72 hours.  Urinalysis: No results for input(s): COLORURINE, LABSPEC, PHURINE, GLUCOSEU, HGBUR, BILIRUBINUR, KETONESUR, PROTEINUR, UROBILINOGEN, NITRITE, LEUKOCYTESUR in the last 72 hours.  Invalid input(s): APPERANCEUR     Imaging: No results found.    Medications:      amLODipine   10 mg Oral Daily   Chlorhexidine  Gluconate Cloth  6 each Topical Daily   enoxaparin  (LOVENOX ) injection  60 mg Subcutaneous Q24H   insulin  aspart  0-20 Units Subcutaneous Q4H   insulin  glargine-yfgn  20 Units Subcutaneous BID    multivitamin with minerals  1 tablet Oral Daily   nicotine   14 mg Transdermal Daily   mouth rinse  15 mL Mouth Rinse 4 times per day   Ensure Max Protein  11 oz Oral BID   sodium chloride  flush  10-40 mL Intracatheter Q12H   dextrose , hydrALAZINE , mouth rinse, polyethylene glycol  Assessment/ Plan:  63 y.o. female with past medical history of diabetes mellitus type 1, hypertension, congestive heart failure, obesity, depression, hyperlipidemia who presented to emergency department with cough, shortness of breath, decreased p.o. intake as well as nausea and vomiting.  Found to be influenza A positive with respiratory distress and initially required mechanical ventilation.  1.  Hyponatremia.  Appears to be multifactorial.  Patient was on HCTZ as well as SSRIs at home.   Update: 3% saline was restarted yesterday and then subsequently stopped.  Serum sodium has stabilized in the low 130s.  Continue to monitor closely.  Could consider salt tablets, fluid restriction, and/or Lasix  if sodium  drops again.  2.  Acute respiratory failure.  Patient successfully weaned off of ventilator and BiPAP.  Still on nasal cannula.     LOS: 5 Jill Crosby 2/7/20257:23 PM

## 2024-01-09 DIAGNOSIS — E871 Hypo-osmolality and hyponatremia: Secondary | ICD-10-CM | POA: Diagnosis not present

## 2024-01-09 DIAGNOSIS — E1065 Type 1 diabetes mellitus with hyperglycemia: Secondary | ICD-10-CM | POA: Diagnosis not present

## 2024-01-09 DIAGNOSIS — J09X2 Influenza due to identified novel influenza A virus with other respiratory manifestations: Secondary | ICD-10-CM | POA: Diagnosis not present

## 2024-01-09 DIAGNOSIS — J9601 Acute respiratory failure with hypoxia: Secondary | ICD-10-CM | POA: Diagnosis not present

## 2024-01-09 LAB — SODIUM
Sodium: 130 mmol/L — ABNORMAL LOW (ref 135–145)
Sodium: 130 mmol/L — ABNORMAL LOW (ref 135–145)

## 2024-01-09 LAB — GLUCOSE, CAPILLARY
Glucose-Capillary: 124 mg/dL — ABNORMAL HIGH (ref 70–99)
Glucose-Capillary: 155 mg/dL — ABNORMAL HIGH (ref 70–99)
Glucose-Capillary: 279 mg/dL — ABNORMAL HIGH (ref 70–99)
Glucose-Capillary: 283 mg/dL — ABNORMAL HIGH (ref 70–99)
Glucose-Capillary: 288 mg/dL — ABNORMAL HIGH (ref 70–99)
Glucose-Capillary: 88 mg/dL (ref 70–99)

## 2024-01-09 LAB — BASIC METABOLIC PANEL
Anion gap: 10 (ref 5–15)
BUN: 13 mg/dL (ref 8–23)
CO2: 31 mmol/L (ref 22–32)
Calcium: 9 mg/dL (ref 8.9–10.3)
Chloride: 90 mmol/L — ABNORMAL LOW (ref 98–111)
Creatinine, Ser: 0.71 mg/dL (ref 0.44–1.00)
GFR, Estimated: >60 mL/min (ref >60)
Glucose, Bld: 125 mg/dL — ABNORMAL HIGH (ref 70–99)
Potassium: 4.2 mmol/L (ref 3.5–5.1)
Sodium: 131 mmol/L — ABNORMAL LOW (ref 135–145)

## 2024-01-09 LAB — MAGNESIUM: Magnesium: 2.2 mg/dL (ref 1.7–2.4)

## 2024-01-09 LAB — PHOSPHORUS: Phosphorus: 4.8 mg/dL — ABNORMAL HIGH (ref 2.5–4.6)

## 2024-01-09 NOTE — Progress Notes (Signed)
 NAME:  Jill Crosby, MRN:  [id number], DOB:  Nov 30, 1961, LOS: 6 ADMISSION DATE:  01/03/2024, CHIEF COMPLAINT:  Hyponatremia, Encephalopathy   History of Present Illness:   This is 63 year old female with a history of type 1 diabetes since age 61, previously on insulin  pump which was switched to subcutaneous injections years ago.  She had an URTI recently with respiratory symptoms and worsening encephalopathy prompting presentation to the hospital.  History was obtained from family members who provided collateral information given intubation and mechanical ventilation. Additional background medical history includes essential hypertension, dyslipidemia, anxiety disorder, history of smoking, arthritis, morbid obesity and glaucoma. She presents with N/V and altered mental status after coming down with a cold over a week ago. She tested positive for influenza A in the ED. BMP showed hyperglycemia at 300 and severe hyponatremia and undetectably low levels less than 102 with hypochloremia.  She was encephalopathic and agitated and was intubated in the ED. PCCM was consulted for admission for management of encephalopathy, respiratory failure, and hyponatremia.  Pertinent  Medical History  -T1DM -HTN -HLD -Obesity  Significant Hospital Events: Including procedures, antibiotic start and stop dates in addition to other pertinent events   01/03/2024: admit to hospital, intubated, severe hyponatremia 01/04/2024: remains intubated, follows commands. Sodium 109-111 01/05/2024: passed SBT, followed commands, sodium improved, extubated to BiPAP 01/06/2024: awake and interactive on nasal cannula. Sodium improving on hypertonic saline 01/07/2024: mental status notably better, on nasal cannula, remains hypoxic. 01/08/2024: sodium continues to improve, mental status improved, remains on oxygen 01/09/2024: sodium is improved, oxygenation back to normal.  Interim History / Subjective:  Feels well, denies pain, mental status  improved.  Objective   Blood pressure (!) 144/58, pulse 79, temperature 98.8 F (37.1 C), temperature source Oral, resp. rate 12, height 5' 4 (1.626 m), weight 110.2 kg, SpO2 97%.    FiO2 (%):  [40 %] 40 %   Intake/Output Summary (Last 24 hours) at 01/09/2024 0903 Last data filed at 01/09/2024 9350 Gross per 24 hour  Intake 293.63 ml  Output 3300 ml  Net -3006.37 ml   Filed Weights   01/06/24 0500 01/07/24 0500 01/08/24 0446  Weight: 119.8 kg 116.9 kg 110.2 kg    Examination: Physical Exam Constitutional:      General: She is not in acute distress.    Appearance: She is obese. She is not ill-appearing.  Cardiovascular:     Rate and Rhythm: Normal rate and regular rhythm.     Heart sounds: Normal heart sounds.  Pulmonary:     Breath sounds: No wheezing or rales (bibasilar rales on posterior auscultation).  Neurological:     General: No focal deficit present.     Mental Status: She is oriented to person, place, and time. Mental status is at baseline.      Assessment & Plan:   #Acute Hypoxic Respiratory Failure #Severe Symptomatic Hyponatremia #Toxic Metabolic Encephalopathy #Severe Hyperglycemia without DKA #Influenza A infection #Acute on Chronic HFpEF  63 year old female with a history of T1DM presents with a few days history of URTI found to have influenza pneumonia and toxic metabolic encephalopathy secondary to severe hyponatremia. This is likely to osmotic diuresis, hydrochlorothiazide  use, SSRI use, and decreased PO intake. She was intubated for respiratory failure and encephalopathy and now extubated with improvement in respiratory status and mental status.  Neuro - discontinued analgosedation and psychotropic medications. Mental status improved. Holding SSRI given hyponatremia. Sodium level is improved.  CV - required vasopressor support secondary to  sedation related hypotension, now discontinued. Lactic acid within normal and is overall well perfused. Elevated  BNP with signs of volume overload suggests heart failure, TTE with signs of HFpEF. Administering PRN diuresis for decompensated heart failure.  Pulm - Respiratory failure following influenza infection with chest xray showing increased interstitial markings. This could represent findings secondary to influenza vs findings of decompensated heart failure given elevated BNP. Extubated to BiPAP after passing SBT was on nasal cannula. She is now on room air and physical exam is improved.  GI - Diet restarted.  Renal - severe hyponatremia, initially < 102. We have corrected her sodium by 6 to 8 mEq/day and her most recent check shows it at 131. Given normalization, will discontinue her hypertonic saline and increase salt intake in diet. Appreciate input from nephrology.  Endo - switched to basal bolus regimen of insulin  given T1DM.  Hem/Onc - heparin  subQ for DVT prophylaxis  ID - influenza infection on tamiflu . Will continue antibiotics for CAP coverage.   Best Practice (right click and Reselect all SmartList Selections daily)   Diet/type: NPO DVT prophylaxis prophylactic heparin   Pressure ulcer(s): N/A GI prophylaxis: H2B Lines: Central line, Arterial Line, and yes and it is still needed Foley:  Yes, and it is still needed Code Status:  full code Last date of multidisciplinary goals of care discussion [01/09/2024]  Labs   CBC: Recent Labs  Lab 01/03/24 1633 01/03/24 2036 01/04/24 0413 01/05/24 0505 01/06/24 0528 01/07/24 0503 01/08/24 0524  WBC 23.1*   < > 21.3* 30.0* 21.2* 15.0* 15.0*  NEUTROABS 20.6*  --   --   --   --   --   --   HGB 12.0   < > 10.9* 10.3* 10.4* 10.4* 10.7*  HCT RESULTS UNAVAILABLE DUE TO INTERFERING SUBSTANCE   < > 29.2* 28.2* 28.7* 30.1* 31.6*  MCV RESULTS UNAVAILABLE DUE TO INTERFERING SUBSTANCE   < > 79.6* 82.0 82.9 85.5 86.6  PLT 323   < > 299 340 367 382 413*   < > = values in this interval not displayed.    Basic Metabolic Panel: Recent Labs  Lab  01/05/24 0505 01/05/24 0935 01/06/24 9471 01/06/24 0645 01/07/24 0503 01/07/24 0631 01/07/24 2146 01/08/24 0128 01/08/24 0524 01/08/24 1149 01/08/24 2019 01/09/24 0422  NA 110*   < > 120*   < > 125*   < > 127*  127* 131* 130* 131* 130* 131*  K 4.7  --  3.5  --  3.2*  --  3.4* 3.5  --   --   --  4.2  CL 77*  --  84*  --  86*  --  87* 90*  --   --   --  90*  CO2 26  --  29  --  31  --  29 31  --   --   --  31  GLUCOSE 260*  --  94  --  75  --  273* 140*  --   --   --  125*  BUN 14  --  10  --  10  --  13 12  --   --   --  13  CREATININE 0.77  --  0.63  --  0.63  --  0.77 0.72  --   --   --  0.71  CALCIUM  8.1*  --  8.3*  --  8.4*  --  8.7* 8.7*  --   --   --  9.0  MG 2.3  --  2.2  --  2.2  --   --  2.2  --   --   --  2.2  PHOS 2.2*  --  2.3*  --  2.9  --   --  4.0  --   --   --  4.8*   < > = values in this interval not displayed.   GFR: Estimated Creatinine Clearance: 86.2 mL/min (by C-G formula based on SCr of 0.71 mg/dL). Recent Labs  Lab 01/03/24 1738 01/03/24 1840 01/03/24 2036 01/03/24 2048 01/04/24 0341 01/04/24 0413 01/05/24 0505 01/06/24 0528 01/07/24 0503 01/08/24 0524  PROCALCITON  --  0.23  --   --   --   --   --   --   --   --   WBC  --   --    < >  --   --    < > 30.0* 21.2* 15.0* 15.0*  LATICACIDVEN 1.0  --   --  1.3 1.0  --   --   --   --   --    < > = values in this interval not displayed.    Liver Function Tests: Recent Labs  Lab 01/03/24 1633 01/04/24 0413 01/05/24 0505 01/06/24 0528  AST 90* 50* 35 28  ALT 56* 41 40 33  ALKPHOS 56 49 45 49  BILITOT 1.4* 0.8 0.7 0.9  PROT 6.8 5.7* 5.7* 5.7*  ALBUMIN 3.4* 2.6* 2.5* 2.5*   Recent Labs  Lab 01/03/24 1840  LIPASE 26   No results for input(s): AMMONIA in the last 168 hours.  ABG    Component Value Date/Time   PHART 7.44 01/06/2024 0528   PCO2ART 47 01/06/2024 0528   PO2ART 85 01/06/2024 0528   HCO3 31.9 (H) 01/06/2024 0528   O2SAT 98.6 01/06/2024 0528     Coagulation  Profile: No results for input(s): INR, PROTIME in the last 168 hours.  Cardiac Enzymes: No results for input(s): CKTOTAL, CKMB, CKMBINDEX, TROPONINI in the last 168 hours.  HbA1C: Hemoglobin A1C  Date/Time Value Ref Range Status  07/25/2021 12:00 AM 7.0  Final   Hgb A1c MFr Bld  Date/Time Value Ref Range Status  01/04/2024 01:06 AM 6.9 (H) 4.8 - 5.6 % Final    Comment:    (NOTE) Pre diabetes:          5.7%-6.4%  Diabetes:              >6.4%  Glycemic control for   <7.0% adults with diabetes   03/24/2023 11:07 AM 7.1 (H) 4.8 - 5.6 % Final    Comment:             Prediabetes: 5.7 - 6.4          Diabetes: >6.4          Glycemic control for adults with diabetes: <7.0     CBG: Recent Labs  Lab 01/08/24 1537 01/08/24 1942 01/08/24 2327 01/09/24 0405 01/09/24 0745  GLUCAP 355* 231* 163* 124* 88    Review of Systems:   Unable to obtain  Past Medical History:  She,  has a past medical history of Anxiety, Depression, Elevated BP, Hypercholesterolemia, Increased BMI, Menopause, Morbid obesity (HCC), and Type I diabetes mellitus (HCC).   Surgical History:   Past Surgical History:  Procedure Laterality Date   BREAST BIOPSY Left 05/09/2020   ribbon clip, stereo bx, HEMANGIOMA, BENIGN. - MAMMARY EPITHELIUM IS NOT PRESENT.   CATARACT EXTRACTION W/PHACO  Left 11/08/2020   Procedure: CATARACT EXTRACTION PHACO AND INTRAOCULAR LENS PLACEMENT (IOC) LEFT DIABETIC 4.14 00:52.1 ;  Surgeon: Jaye Fallow, MD;  Location: [facility];  Service: Ophthalmology;  Laterality: Left;   CATARACT EXTRACTION W/PHACO Right 12/04/2020   Procedure: CATARACT EXTRACTION PHACO AND INTRAOCULAR LENS PLACEMENT (IOC) RIGHT DIABETIC 6.11 00:46.1;  Surgeon: Jaye Fallow, MD;  Location: [facility];  Service: Ophthalmology;  Laterality: Right;  Diabetic - insulin    CESAREAN SECTION  1997   COLONOSCOPY WITH PROPOFOL  N/A 01/18/2021   Procedure: COLONOSCOPY WITH PROPOFOL ;   Surgeon: Jinny Carmine, MD;  Location: [facility];  Service: Endoscopy;  Laterality: N/A;  priority 4     Social History:   reports that she has quit smoking. Her smoking use included cigarettes. She has a 10 pack-year smoking history. She has never used smokeless tobacco. She reports that she does not drink alcohol and does not use drugs.   Family History:  Her family history includes Breast cancer in her maternal aunt and maternal grandmother; Cancer in her father; Colon cancer in her maternal grandfather and paternal grandfather; Colon polyps in her mother; Diabetes in her maternal grandfather; Hyperlipidemia in her mother; Hypertension in her mother. There is no history of Heart disease or Ovarian cancer.   Allergies Allergies  Allergen Reactions   Codeine Nausea And Vomiting     Home Medications  Prior to Admission medications   Medication Sig Start Date End Date Taking? Authorizing Provider  meloxicam (MOBIC) 7.5 MG tablet Take 7.5 mg by mouth daily. 09/07/23  Yes [provider]  amLODipine  (NORVASC ) 10 MG tablet TAKE 1 TABLET BY MOUTH DAILY 08/05/23   Glendia Shad, MD  BAYER MICROLET LANCETS lancets USE AS DIRECTED TO CHECK  SUGARS 1 TO 2 TIMES A DAY 01/15/18   Glendia Shad, MD  cholecalciferol (VITAMIN D3) 25 MCG (1000 UT) tablet Take 1,000 Units by mouth daily.    [provider]  CONTOUR NEXT TEST test strip USE TO CHECK BLOOD GLUCOSE 6 TO  7 TIMES DAILY 07/10/23   Glendia Shad, MD  Cyanocobalamin  (B-12 PO) Take by mouth.    [provider]  hydrochlorothiazide  (HYDRODIURIL ) 25 MG tablet TAKE 1 TABLET BY MOUTH DAILY 03/03/23   Glendia Shad, MD  insulin  glargine (LANTUS  SOLOSTAR) 100 UNIT/ML Solostar Pen INJECT SUBCUTANEOUSLY 80 UNITS  AT NIGHT 12/11/23   Glendia Shad, MD  insulin  lispro (HUMALOG  KWIKPEN) 100 UNIT/ML KwikPen INJECT SUBCUTANEOUSLY 65  UNITS DAILY.  (Takes distributed over three meals counting carbs) 07/15/23   Glendia Shad, MD  Insulin  Pen Needle 32G X 4 MM MISC Use as directed with insulin  4 x daily. 07/15/23   Glendia Shad, MD  levocetirizine (XYZAL ) 5 MG tablet Take 1 tablet (5 mg total) by mouth every evening. 03/17/23   Glendia Shad, MD  lisinopril  (ZESTRIL ) 40 MG tablet Take 1 tablet (40 mg total) by mouth daily. 08/05/23   Glendia Shad, MD  REPATHA  SURECLICK 140 MG/ML SOAJ INJECT 1 PEN SUBCUTANEOUSLY  EVERY 2 WEEKS 12/30/23   Glendia Shad, MD  sertraline  (ZOLOFT ) 50 MG tablet TAKE 1 TABLET BY MOUTH DAILY 03/03/23   Glendia Shad, MD      I spent 35 minutes caring for this patient today, including preparing to see the patient, obtaining a medical history , reviewing a separately obtained history, performing a medically appropriate examination and/or evaluation, counseling and educating the patient/family/caregiver, ordering medications, tests, or procedures, and documenting clinical information in the electronic health record  Belva November, MD [facility] 01/09/2024 9:47 AM

## 2024-01-09 NOTE — Plan of Care (Signed)
 Continuing with plan of care.

## 2024-01-09 NOTE — TOC Progression Note (Signed)
 Transition of Care [facility]) - Progression Note    Patient Details  Name: Jill Crosby MRN: [id number] Date of Birth: 04-14-1961  Transition of Care [facility]) CM/SW Contact  [address], [place], [place] Phone Number: 01/09/2024, 2:18 PM  Clinical Narrative:     This CSW met with patient at bedside to confirm bed choice. Patient would like to be considered for [facility]. Patient no longer intubated. [facility] informed.  Samel Bruna, LCSW Transition of Care    Expected Discharge Plan: Skilled Nursing Facility Barriers to Discharge: Continued Medical Work up, Engineer, Mining)  Expected Discharge Plan and Services     Post Acute Care Choice: Skilled Nursing Facility Living arrangements for the past 2 months: Single Family Home                                       Social Determinants of Health (SDOH) Interventions SDOH Screenings   Food Insecurity: Patient Unable To Answer (01/03/2024)  Housing: Patient Unable To Answer (01/03/2024)  Transportation Needs: Patient Unable To Answer (01/03/2024)  Utilities: Patient Unable To Answer (01/03/2024)  Depression (PHQ2-9): Low Risk  (08/05/2023)  Social Connections: Patient Unable To Answer (01/03/2024)  Tobacco Use: Medium Risk (01/03/2024)    Readmission Risk Interventions     No data to display

## 2024-01-09 NOTE — Consult Note (Signed)
 PHARMACY CONSULT NOTE - ELECTROLYTES  Pharmacy Consult for Electrolyte Monitoring and Replacement   Recent Labs: Potassium (mmol/L)  Date Value  01/09/2024 4.2   Magnesium  (mg/dL)  Date Value  97/91/7974 2.2   Calcium  (mg/dL)  Date Value  97/91/7974 9.0   Albumin (g/dL)  Date Value  97/94/7974 2.5 (L)  03/24/2023 4.2   Phosphorus (mg/dL)  Date Value  97/91/7974 4.8 (H)   Sodium (mmol/L)  Date Value  01/09/2024 131 (L)  04/29/2023 136    Height: 5' 4 (162.6 cm) Weight: 110.2 kg (242 lb 15.2 oz) IBW/kg (Calculated) : 54.7 Estimated Creatinine Clearance: 86.2 mL/min (by C-G formula based on SCr of 0.71 mg/dL).  Assessment  Jill Crosby is a 63 y.o. female presenting with severe hyponatremia and altered mental status. Patient is flu positive. PMH significant for T1DM, obesity, HTN. Pharmacy has been consulted to monitor and replace electrolytes.  Goal of Therapy: Electrolytes within normal limits  Plan:  No replacement currently indicated Re-check BMP, Mg, Phos tomorrow AM  Thank you for allowing pharmacy to be a part of this patient's care.  Adriana JONETTA Bolster, PharmD Clinical Pharmacist 01/09/2024 7:07 AM

## 2024-01-09 NOTE — Progress Notes (Signed)
 [facility]  ROUNDING NOTE   Subjective:   Son at bedside.   Patient alert and oriented. Currently without complaints.   UOP 3300 recorded - patient had some incontinence as well.   Na 131.   Objective:  Vital signs in last 24 hours:  Temp:  [97.6 F (36.4 C)-98.8 F (37.1 C)] 98.8 F (37.1 C) (02/08 0400) Pulse Rate:  [79-99] 79 (02/08 0700) Resp:  [12-22] 12 (02/08 0700) BP: (116-155)/(47-104) 144/58 (02/08 0400) SpO2:  [92 %-100 %] 97 % (02/08 0700) FiO2 (%):  [40 %] 40 % (02/07 2347)  Weight change:  Filed Weights   01/06/24 0500 01/07/24 0500 01/08/24 0446  Weight: 119.8 kg 116.9 kg 110.2 kg    Intake/Output: I/O last 3 completed shifts: In: 1125.2 [P.O.:240; I.V.:382; IV Piggyback:503.2] Out: 4000 [Urine:4000]   Intake/Output this shift:  No intake/output data recorded.  Physical Exam: General: No acute distress  Head: Normocephalic, atraumatic. Moist oral mucosal membranes  Neck: Supple  Lungs:  clear  Heart: regular  Abdomen:  Soft, nontender, bowel sounds present  Extremities: Trace peripheral edema.  Neurologic: Arousable  Skin: No acute rash  Access: No hemodialysis access    Basic Metabolic Panel: Recent Labs  Lab 01/05/24 0505 01/05/24 0935 01/06/24 9471 01/06/24 9354 01/07/24 0503 01/07/24 0631 01/07/24 2146 01/08/24 0128 01/08/24 0524 01/08/24 1149 01/08/24 2019 01/09/24 0422  NA 110*   < > 120*   < > 125*   < > 127*  127* 131* 130* 131* 130* 131*  K 4.7  --  3.5  --  3.2*  --  3.4* 3.5  --   --   --  4.2  CL 77*  --  84*  --  86*  --  87* 90*  --   --   --  90*  CO2 26  --  29  --  31  --  29 31  --   --   --  31  GLUCOSE 260*  --  94  --  75  --  273* 140*  --   --   --  125*  BUN 14  --  10  --  10  --  13 12  --   --   --  13  CREATININE 0.77  --  0.63  --  0.63  --  0.77 0.72  --   --   --  0.71  CALCIUM  8.1*  --  8.3*  --  8.4*  --  8.7* 8.7*  --   --   --  9.0  MG 2.3  --  2.2  --  2.2  --   --  2.2  --    --   --  2.2  PHOS 2.2*  --  2.3*  --  2.9  --   --  4.0  --   --   --  4.8*   < > = values in this interval not displayed.    Liver Function Tests: Recent Labs  Lab 01/03/24 1633 01/04/24 0413 01/05/24 0505 01/06/24 0528  AST 90* 50* 35 28  ALT 56* 41 40 33  ALKPHOS 56 49 45 49  BILITOT 1.4* 0.8 0.7 0.9  PROT 6.8 5.7* 5.7* 5.7*  ALBUMIN 3.4* 2.6* 2.5* 2.5*   Recent Labs  Lab 01/03/24 1840  LIPASE 26   No results for input(s): AMMONIA in the last 168 hours.  CBC: Recent Labs  Lab 01/03/24 1633 01/03/24 2036 01/04/24 0413  01/05/24 0505 01/06/24 0528 01/07/24 0503 01/08/24 0524  WBC 23.1*   < > 21.3* 30.0* 21.2* 15.0* 15.0*  NEUTROABS 20.6*  --   --   --   --   --   --   HGB 12.0   < > 10.9* 10.3* 10.4* 10.4* 10.7*  HCT RESULTS UNAVAILABLE DUE TO INTERFERING SUBSTANCE   < > 29.2* 28.2* 28.7* 30.1* 31.6*  MCV RESULTS UNAVAILABLE DUE TO INTERFERING SUBSTANCE   < > 79.6* 82.0 82.9 85.5 86.6  PLT 323   < > 299 340 367 382 413*   < > = values in this interval not displayed.    Cardiac Enzymes: No results for input(s): CKTOTAL, CKMB, CKMBINDEX, TROPONINI in the last 168 hours.  BNP: Invalid input(s): POCBNP  CBG: Recent Labs  Lab 01/08/24 1537 01/08/24 1942 01/08/24 2327 01/09/24 0405 01/09/24 0745  GLUCAP 355* 231* 163* 124* 88    Microbiology: Results for orders placed or performed during the hospital encounter of 01/03/24  Resp panel by RT-PCR (RSV, Flu A&B, Covid) Anterior Nasal Swab     Status: Abnormal   Collection Time: 01/03/24  2:52 PM   Specimen: Anterior Nasal Swab  Result Value Ref Range Status   SARS Coronavirus 2 by RT PCR NEGATIVE NEGATIVE Final    Comment: (NOTE) SARS-CoV-2 target nucleic acids are NOT DETECTED.  The SARS-CoV-2 RNA is generally detectable in upper respiratory specimens during the acute phase of infection. The lowest concentration of SARS-CoV-2 viral copies this assay can detect is 138 copies/mL. A negative  result does not preclude SARS-Cov-2 infection and should not be used as the sole basis for treatment or other patient management decisions. A negative result may occur with  improper specimen collection/handling, submission of specimen other than nasopharyngeal swab, presence of viral mutation(s) within the areas targeted by this assay, and inadequate number of viral copies(<138 copies/mL). A negative result must be combined with clinical observations, patient history, and epidemiological information. The expected result is Negative.  Fact Sheet for Patients:  bloggercourse.com  Fact Sheet for Healthcare Providers:  seriousbroker.it  This test is no t yet approved or cleared by the [place]  FDA and  has been authorized for detection and/or diagnosis of SARS-CoV-2 by FDA under an Emergency Use Authorization (EUA). This EUA will remain  in effect (meaning this test can be used) for the duration of the COVID-19 declaration under Section 564(b)(1) of the Act, 21 U.S.C.section 360bbb-3(b)(1), unless the authorization is terminated  or revoked sooner.       Influenza A by PCR POSITIVE (A) NEGATIVE Final   Influenza B by PCR NEGATIVE NEGATIVE Final    Comment: (NOTE) The Xpert Xpress SARS-CoV-2/FLU/RSV plus assay is intended as an aid in the diagnosis of influenza from Nasopharyngeal swab specimens and should not be used as a sole basis for treatment. Nasal washings and aspirates are unacceptable for Xpert Xpress SARS-CoV-2/FLU/RSV testing.  Fact Sheet for Patients: bloggercourse.com  Fact Sheet for Healthcare Providers: seriousbroker.it  This test is not yet approved or cleared by the [place]  FDA and has been authorized for detection and/or diagnosis of SARS-CoV-2 by FDA under an Emergency Use Authorization (EUA). This EUA will remain in effect (meaning this test can be  used) for the duration of the COVID-19 declaration under Section 564(b)(1) of the Act, 21 U.S.C. section 360bbb-3(b)(1), unless the authorization is terminated or revoked.     Resp Syncytial Virus by PCR NEGATIVE NEGATIVE Final    Comment: (NOTE) Fact  Sheet for Patients: bloggercourse.com  Fact Sheet for Healthcare Providers: seriousbroker.it  This test is not yet approved or cleared by the [place]  FDA and has been authorized for detection and/or diagnosis of SARS-CoV-2 by FDA under an Emergency Use Authorization (EUA). This EUA will remain in effect (meaning this test can be used) for the duration of the COVID-19 declaration under Section 564(b)(1) of the Act, 21 U.S.C. section 360bbb-3(b)(1), unless the authorization is terminated or revoked.  Performed at [facility], [address] [place], [place] [address]   Blood culture (routine x 2)     Status: None   Collection Time: 01/03/24  5:43 PM   Specimen: BLOOD LEFT HAND  Result Value Ref Range Status   Specimen Description BLOOD LEFT HAND  Final   Special Requests   Final    BOTTLES DRAWN AEROBIC AND ANAEROBIC Blood Culture results may not be optimal due to an inadequate volume of blood received in culture bottles   Culture   Final    NO GROWTH 5 DAYS Performed at [facility], [address] [place], [place] [address]    Report Status 01/08/2024 FINAL  Final  Blood culture (routine x 2)     Status: None   Collection Time: 01/03/24  6:40 PM   Specimen: BLOOD RIGHT HAND  Result Value Ref Range Status   Specimen Description BLOOD RIGHT HAND  Final   Special Requests   Final    BOTTLES DRAWN AEROBIC AND ANAEROBIC Blood Culture results may not be optimal due to an inadequate volume of blood received in culture bottles   Culture   Final    NO GROWTH 5 DAYS Performed at [facility], [address] [place], [place] [address]    Report  Status 01/08/2024 FINAL  Final  Culture, blood (Routine X 2) w Reflex to ID Panel     Status: None   Collection Time: 01/03/24  8:36 PM   Specimen: BLOOD  Result Value Ref Range Status   Specimen Description BLOOD RIGHT HAND  Final   Special Requests   Final    BOTTLES DRAWN AEROBIC ONLY Blood Culture results may not be optimal due to an inadequate volume of blood received in culture bottles   Culture   Final    NO GROWTH 5 DAYS Performed at [facility], [address] [place], [place] [address]    Report Status 01/08/2024 FINAL  Final  Culture, blood (Routine X 2) w Reflex to ID Panel     Status: None   Collection Time: 01/03/24  8:40 PM   Specimen: BLOOD  Result Value Ref Range Status   Specimen Description BLOOD BLOOD LEFT HAND  Final   Special Requests   Final    BOTTLES DRAWN AEROBIC AND ANAEROBIC Blood Culture results may not be optimal due to an inadequate volume of blood received in culture bottles   Culture   Final    NO GROWTH 5 DAYS Performed at [facility], [address], [place], [place] [address]    Report Status 01/08/2024 FINAL  Final  MRSA Next Gen by PCR, Nasal     Status: None   Collection Time: 01/03/24  8:45 PM   Specimen: Nasal Mucosa; Nasal Swab  Result Value Ref Range Status   MRSA by PCR Next Gen NOT DETECTED NOT DETECTED Final    Comment: (NOTE) The GeneXpert MRSA Assay (FDA approved for NASAL specimens only), is one component of a comprehensive MRSA  colonization surveillance program. It is not intended to diagnose MRSA infection nor to guide or monitor treatment for MRSA infections. Test performance is not FDA approved in patients less than 34 years old. Performed at [facility], [address] [place], [place] [address]   Respiratory (~20 pathogens) panel by PCR     Status: Abnormal   Collection Time: 01/04/24  5:28 PM   Specimen: Nasopharyngeal Swab; Respiratory  Result Value Ref Range Status   Adenovirus NOT  DETECTED NOT DETECTED Final   Coronavirus 229E NOT DETECTED NOT DETECTED Final    Comment: (NOTE) The Coronavirus on the Respiratory Panel, DOES NOT test for the novel  Coronavirus (2019 nCoV)    Coronavirus HKU1 NOT DETECTED NOT DETECTED Final   Coronavirus NL63 NOT DETECTED NOT DETECTED Final   Coronavirus OC43 NOT DETECTED NOT DETECTED Final   Metapneumovirus NOT DETECTED NOT DETECTED Final   Rhinovirus / Enterovirus NOT DETECTED NOT DETECTED Final   Influenza A H1 2009 DETECTED (A) NOT DETECTED Final   Influenza B NOT DETECTED NOT DETECTED Final   Parainfluenza Virus 1 NOT DETECTED NOT DETECTED Final   Parainfluenza Virus 2 NOT DETECTED NOT DETECTED Final   Parainfluenza Virus 3 NOT DETECTED NOT DETECTED Final   Parainfluenza Virus 4 NOT DETECTED NOT DETECTED Final   Respiratory Syncytial Virus NOT DETECTED NOT DETECTED Final   Bordetella pertussis NOT DETECTED NOT DETECTED Final   Bordetella Parapertussis NOT DETECTED NOT DETECTED Final   Chlamydophila pneumoniae NOT DETECTED NOT DETECTED Final   Mycoplasma pneumoniae NOT DETECTED NOT DETECTED Final    Comment: Performed at [facility], [address], [place], [place] [address]    Coagulation Studies: No results for input(s): LABPROT, INR in the last 72 hours.  Urinalysis: No results for input(s): COLORURINE, LABSPEC, PHURINE, GLUCOSEU, HGBUR, BILIRUBINUR, KETONESUR, PROTEINUR, UROBILINOGEN, NITRITE, LEUKOCYTESUR in the last 72 hours.  Invalid input(s): APPERANCEUR     Imaging: No results found.    Medications:      amLODipine   10 mg Oral Daily   Chlorhexidine  Gluconate Cloth  6 each Topical Daily   enoxaparin  (LOVENOX ) injection  60 mg Subcutaneous Q24H   insulin  aspart  0-20 Units Subcutaneous Q4H   insulin  glargine-yfgn  20 Units Subcutaneous BID   multivitamin with minerals  1 tablet Oral Daily   nicotine   14 mg Transdermal Daily   mouth rinse  15 mL Mouth Rinse 4 times  per day   Ensure Max Protein  11 oz Oral BID   sodium chloride  flush  10-40 mL Intracatheter Q12H   dextrose , hydrALAZINE , mouth rinse, polyethylene glycol  Assessment/ Plan:  63 y.o. female with past medical history of diabetes mellitus type 1, hypertension, congestive heart failure, obesity, depression, hyperlipidemia who presented to emergency department with cough, shortness of breath, decreased p.o. intake as well as nausea and vomiting.  Found to be influenza A positive with respiratory distress and initially required mechanical ventilation.  1.  Hyponatremia.  Appears to be multifactorial.  Patient was on HCTZ as well as SSRIs at home.   Status post: 3% saline  - restart fluid restriction  2.  Acute respiratory failure.  Patient successfully weaned off of ventilator and BiPAP.  Still on nasal cannula.     LOS: 6 Jill Crosby 2/8/202510:38 AM

## 2024-01-10 DIAGNOSIS — E871 Hypo-osmolality and hyponatremia: Secondary | ICD-10-CM | POA: Diagnosis not present

## 2024-01-10 LAB — BASIC METABOLIC PANEL
Anion gap: 10 (ref 5–15)
BUN: 17 mg/dL (ref 8–23)
CO2: 31 mmol/L (ref 22–32)
Calcium: 9.1 mg/dL (ref 8.9–10.3)
Chloride: 92 mmol/L — ABNORMAL LOW (ref 98–111)
Creatinine, Ser: 0.77 mg/dL (ref 0.44–1.00)
GFR, Estimated: >60 mL/min (ref >60)
Glucose, Bld: 111 mg/dL — ABNORMAL HIGH (ref 70–99)
Potassium: 4.6 mmol/L (ref 3.5–5.1)
Sodium: 133 mmol/L — ABNORMAL LOW (ref 135–145)

## 2024-01-10 LAB — GLUCOSE, CAPILLARY
Glucose-Capillary: 83 mg/dL (ref 70–99)
Glucose-Capillary: 193 mg/dL — ABNORMAL HIGH (ref 70–99)
Glucose-Capillary: 200 mg/dL — ABNORMAL HIGH (ref 70–99)
Glucose-Capillary: 456 mg/dL — ABNORMAL HIGH (ref 70–99)
Glucose-Capillary: 466 mg/dL — ABNORMAL HIGH (ref 70–99)
Glucose-Capillary: 379 mg/dL — ABNORMAL HIGH (ref 70–99)
Glucose-Capillary: 410 mg/dL — ABNORMAL HIGH (ref 70–99)
Glucose-Capillary: 426 mg/dL — ABNORMAL HIGH (ref 70–99)
Glucose-Capillary: 234 mg/dL — ABNORMAL HIGH (ref 70–99)

## 2024-01-10 LAB — MAGNESIUM: Magnesium: 2.3 mg/dL (ref 1.7–2.4)

## 2024-01-10 LAB — CBC
HCT: 30.2 % — ABNORMAL LOW (ref 36.0–46.0)
Hemoglobin: 10 g/dL — ABNORMAL LOW (ref 12.0–15.0)
MCH: 29.6 pg (ref 26.0–34.0)
MCHC: 33.1 g/dL (ref 30.0–36.0)
MCV: 89.3 fL (ref 80.0–100.0)
Platelets: 390 10*3/uL (ref 150–400)
RBC: 3.38 MIL/uL — ABNORMAL LOW (ref 3.87–5.11)
RDW: 13.8 % (ref 11.5–15.5)
WBC: 13.8 10*3/uL — ABNORMAL HIGH (ref 4.0–10.5)
nRBC: 0 % (ref 0.0–0.2)

## 2024-01-10 LAB — PHOSPHORUS: Phosphorus: 5.4 mg/dL — ABNORMAL HIGH (ref 2.5–4.6)

## 2024-01-10 MED ORDER — INSULIN ASPART 100 UNIT/ML IJ SOLN
10.0000 [IU] | Freq: Once | INTRAMUSCULAR | Status: AC
  Administered 2024-01-10: 10 [IU] via SUBCUTANEOUS
  Filled 2024-01-10: qty 1

## 2024-01-10 MED ORDER — CARMEX CLASSIC LIP BALM EX OINT: TOPICAL_OINTMENT | CUTANEOUS | Status: DC | PRN

## 2024-01-10 NOTE — Consult Note (Signed)
 PHARMACY CONSULT NOTE - ELECTROLYTES  Pharmacy Consult for Electrolyte Monitoring and Replacement   Recent Labs: Potassium (mmol/L)  Date Value  01/10/2024 4.6   Magnesium  (mg/dL)  Date Value  97/90/7974 2.3   Calcium  (mg/dL)  Date Value  97/90/7974 9.1   Albumin (g/dL)  Date Value  97/94/7974 2.5 (L)  03/24/2023 4.2   Phosphorus (mg/dL)  Date Value  97/90/7974 5.4 (H)   Sodium (mmol/L)  Date Value  01/10/2024 133 (L)  04/29/2023 136    Height: 5' 4 (162.6 cm) Weight: 112.4 kg (247 lb 12.8 oz) IBW/kg (Calculated) : 54.7 Estimated Creatinine Clearance: 87.3 mL/min (by C-G formula based on SCr of 0.77 mg/dL).  Assessment  Jill Crosby is a 63 y.o. female presenting with severe hyponatremia and altered mental status. Patient is flu positive. PMH significant for T1DM, obesity, HTN. Pharmacy has been consulted to monitor and replace electrolytes. Patient was on HCTZ as well as SSRIs at home.   Goal of Therapy: Electrolytes within normal limits  Plan:  No replacement currently indicated Patient has transferred out of ICU, nephrology following hyponatremia- will sign off of CCM pharmacy monitoring consult.   May reconsult if desired  Thank you for allowing pharmacy to be a part of this patient's care.  Jill Crosby, PharmD Clinical Pharmacist 01/10/2024 8:06 AM

## 2024-01-10 NOTE — Plan of Care (Signed)
  Problem: Education: Goal: Knowledge of General Education information will improve Description: Including pain rating scale, medication(s)/side effects and non-pharmacologic comfort measures 01/10/2024 0429 by Ruffus Michelene KIDD, RN Outcome: Progressing 01/10/2024 0429 by Ruffus Michelene KIDD, RN Outcome: Progressing   Problem: Health Behavior/Discharge Planning: Goal: Ability to manage health-related needs will improve 01/10/2024 0429 by Ruffus Michelene KIDD, RN Outcome: Progressing 01/10/2024 0429 by Ruffus Michelene KIDD, RN Outcome: Progressing   Problem: Clinical Measurements: Goal: Ability to maintain clinical measurements within normal limits will improve 01/10/2024 0429 by Ruffus Michelene KIDD, RN Outcome: Progressing 01/10/2024 0429 by Ruffus Michelene KIDD, RN Outcome: Progressing Goal: Will remain free from infection 01/10/2024 0429 by Ruffus Michelene KIDD, RN Outcome: Progressing 01/10/2024 0429 by Ruffus Michelene KIDD, RN Outcome: Progressing

## 2024-01-10 NOTE — Progress Notes (Signed)
 Physical Therapy Treatment Patient Details Name: Jill Crosby MRN: [id number] DOB: 01-14-1961 Today's Date: 01/10/2024   History of Present Illness Pt is a 63 y.o. female presenting with severe hyponatremia and altered mental status. Patient is flu positive, extubated 2/4. PMH significant for T1DM, obesity, HTN.    PT Comments  Pt in chair.  On room air with high flow canula on bed at 3 lpm.  Stated she is unsure if she took it off or nursing.  Sats on room air 92% at rest.  She is able to stand with CGA x 1 and walk 30' with RW and cga/min a x 1 with some decreased gait quality/increased latera sway as she fatigues.  Seated rest with sats 82% which slowly returns to baseline.  O2 is placed at 3 lpm and she is able to walk an additional 30' with sats at 93%.  Discussed with RN.  Left off O2 at rest in room but may need further O2 support with mobility.   SaO2 on room air at rest = 92 SaO2 on room air while ambulating = 82 SaO2 on 3 liters of O2 while ambulating = 93%  Overall progressing but not at baseline.  Supportive son in room and assists with chair follow.   If plan is discharge home, recommend the following: A little help with walking and/or transfers;A little help with bathing/dressing/bathroom;Assistance with cooking/housework;Assist for transportation;Help with stairs or ramp for entrance   Can travel by private vehicle        Equipment Recommendations  Rolling walker (2 wheels);BSC/3in1    Recommendations for Other Services       Precautions / Restrictions Precautions Precautions: Fall Restrictions Weight Bearing Restrictions Per Provider Order: No     Mobility  Bed Mobility               General bed mobility comments: in chair before and after    Transfers Overall transfer level: Needs assistance Equipment used: Rolling walker (2 wheels) Transfers: Sit to/from Stand Sit to Stand: Contact guard assist                 Ambulation/Gait Ambulation/Gait assistance: Contact guard assist Gait Distance (Feet): 30 Feet Assistive device: Rolling walker (2 wheels) Gait Pattern/deviations: Step-to pattern, Decreased stride length, Trunk flexed, Wide base of support Gait velocity: decreased     General Gait Details: lateral sway increases with fatigue.  son provides recliner follow for safety   Stairs             Wheelchair Mobility     Tilt Bed    Modified Rankin (Stroke Patients Only)       Balance Overall balance assessment: Needs assistance Sitting-balance support: Feet supported Sitting balance-Leahy Scale: Good     Standing balance support: Reliant on assistive device for balance, During functional activity Standing balance-Leahy Scale: Fair                              Cognition Arousal: Alert Behavior During Therapy: WFL for tasks assessed/performed Overall Cognitive Status: Within Functional Limits for tasks assessed                                          Exercises      General Comments        Pertinent Vitals/Pain Pain Assessment Pain Assessment:  No/denies pain    Home Living                          Prior Function            PT Goals (current goals can now be found in the care plan section) Progress towards PT goals: Progressing toward goals    Frequency    Min 1X/week      PT Plan      Co-evaluation              AM-PAC PT 6 Clicks Mobility   Outcome Measure  Help needed turning from your back to your side while in a flat bed without using bedrails?: A Little Help needed moving from lying on your back to sitting on the side of a flat bed without using bedrails?: A Little Help needed moving to and from a bed to a chair (including a wheelchair)?: A Little Help needed standing up from a chair using your arms (e.g., wheelchair or bedside chair)?: A Little Help needed to walk in hospital room?: A  Little Help needed climbing 3-5 steps with a railing? : A Lot 6 Click Score: 17    End of Session Equipment Utilized During Treatment: Gait belt;Oxygen Activity Tolerance: Patient tolerated treatment well Patient left: in chair;with call bell/phone within reach;with nursing/sitter in room;with family/visitor present;with chair alarm set Nurse Communication: Mobility status PT Visit Diagnosis: Other abnormalities of gait and mobility (R26.89);Difficulty in walking, not elsewhere classified (R26.2);Muscle weakness (generalized) (M62.81)     Time: 9098-9079 PT Time Calculation (min) (ACUTE ONLY): 19 min  Charges:    $Gait Training: 8-22 mins PT General Charges $$ ACUTE PT VISIT: 1 Visit                   Lauraine Gills, PTA 01/10/24, 9:50 AM

## 2024-01-10 NOTE — Progress Notes (Signed)
 Transferred to 144. She has her cell phone and glasses in land. Contacted IV team for DBIV .

## 2024-01-10 NOTE — Progress Notes (Signed)
 Glucose recheck 379. Dr Mosetta Areola notified new order received

## 2024-01-10 NOTE — Progress Notes (Signed)
 Patient resting comfortably in bed with no complaints. CBG 466 Dr. Mosetta Areola notified. No need orders at this time. MD advised to follow current SSI order and reassess glucose one hour post treatment.

## 2024-01-10 NOTE — Progress Notes (Signed)
 PROGRESS NOTE    Jill Crosby  FMW:[id number] DOB: Jun 02, 1961 DOA: 01/03/2024 PCP: Glendia Shad, MD    Brief Narrative:   63 year old female with a history of type 1 diabetes since age 56, previously on insulin  pump which was switched to subcutaneous injections years ago.  She had an URTI recently with respiratory symptoms and worsening encephalopathy prompting presentation to the hospital.   History was obtained from family members who provided collateral information given intubation and mechanical ventilation. Additional background medical history includes essential hypertension, dyslipidemia, anxiety disorder, history of smoking, arthritis, morbid obesity and glaucoma. She presents with N/V and altered mental status after coming down with a cold over a week ago. She tested positive for influenza A in the ED. BMP showed hyperglycemia at 300 and severe hyponatremia and undetectably low levels less than 102 with hypochloremia.  She was encephalopathic and agitated and was intubated in the ED. PCCM was consulted for admission for management of encephalopathy, respiratory failure, and hyponatremia.  Extubated successfully on 2/4.  Remained stable on nasal cannula.  Sodium improving mental status improving.   Assessment & Plan:   Principal Problem:   Hyponatremia  Severe hyponatremia Initially presented with sodium less than 102.  Sodium corrected by 60 mEq a day.  Most recently 133 saline discontinued. Plan: Fluid restrict Daily BMP  Influenza pneumonia Extubated successfully On nasal cannula Wean as tolerated  Acute metabolic encephalopathy Likely secondary to infection and hyponatremia Mental status baseline  Essential hypertension PTA Norvasc   Insulin -dependent diabetes mellitus Basal bolus regimen Carb modified diet  Class III obesity BMI 42.5.  Complicating factor in overall care and prognosis.   DVT prophylaxis: SQ lovenox  Code Status: FULL Family  Communication:Son at bedside 2/9 Disposition Plan: Status is: Inpatient Remains inpatient appropriate because: Severe hyponatremia   Level of care: Med-Surg  Consultants:  Nephrology  Procedures:  None  Antimicrobials: None    Subjective: Seen and examined.  Sitting up in chair.  Appears fatigued otherwise stable.  Mental status intact.  Objective: Vitals:   01/10/24 0602 01/10/24 0630 01/10/24 0750 01/10/24 0909  BP: 139/77  (!) 129/38 (!) 155/58  Pulse: 88  83 85  Resp: 17  18   Temp: 98.9 F (37.2 C)  98.3 F (36.8 C)   TempSrc:   Oral   SpO2: 99%  97% 96%  Weight:  112.4 kg    Height:        Intake/Output Summary (Last 24 hours) at 01/10/2024 1112 Last data filed at 01/10/2024 0804 Gross per 24 hour  Intake 600 ml  Output 3600 ml  Net -3000 ml   Filed Weights   01/08/24 0446 01/09/24 0707 01/10/24 0630  Weight: 110.2 kg 109.4 kg 112.4 kg    Examination:  General exam: Appears calm and comfortable  Respiratory system: Clear to auscultation. Respiratory effort normal. Cardiovascular system: S1-S2, RRR, no murmurs, no pedal edema Gastrointestinal system: Obese, soft, NT/ND, bowel sounds Central nervous system: Alert and oriented. No focal neurological deficits. Extremities: Symmetric 5 x 5 power. Skin: No rashes, lesions or ulcers Psychiatry: Judgement and insight appear normal. Mood & affect appropriate.     Data Reviewed: I have personally reviewed following labs and imaging studies  CBC: Recent Labs  Lab 01/03/24 1633 01/03/24 2036 01/05/24 0505 01/06/24 0528 01/07/24 0503 01/08/24 0524 01/10/24 0325  WBC 23.1*   < > 30.0* 21.2* 15.0* 15.0* 13.8*  NEUTROABS 20.6*  --   --   --   --   --   --  HGB 12.0   < > 10.3* 10.4* 10.4* 10.7* 10.0*  HCT RESULTS UNAVAILABLE DUE TO INTERFERING SUBSTANCE   < > 28.2* 28.7* 30.1* 31.6* 30.2*  MCV RESULTS UNAVAILABLE DUE TO INTERFERING SUBSTANCE   < > 82.0 82.9 85.5 86.6 89.3  PLT 323   < > 340 367 382 413*  390   < > = values in this interval not displayed.   Basic Metabolic Panel: Recent Labs  Lab 01/06/24 0528 01/06/24 0645 01/07/24 0503 01/07/24 0631 01/07/24 2146 01/08/24 0128 01/08/24 0524 01/08/24 2019 01/09/24 0422 01/09/24 1215 01/09/24 2006 01/10/24 0325  NA 120*   < > 125*   < > 127*  127* 131*   < > 130* 131* 130* 130* 133*  K 3.5  --  3.2*  --  3.4* 3.5  --   --  4.2  --   --  4.6  CL 84*  --  86*  --  87* 90*  --   --  90*  --   --  92*  CO2 29  --  31  --  29 31  --   --  31  --   --  31  GLUCOSE 94  --  75  --  273* 140*  --   --  125*  --   --  111*  BUN 10  --  10  --  13 12  --   --  13  --   --  17  CREATININE 0.63  --  0.63  --  0.77 0.72  --   --  0.71  --   --  0.77  CALCIUM  8.3*  --  8.4*  --  8.7* 8.7*  --   --  9.0  --   --  9.1  MG 2.2  --  2.2  --   --  2.2  --   --  2.2  --   --  2.3  PHOS 2.3*  --  2.9  --   --  4.0  --   --  4.8*  --   --  5.4*   < > = values in this interval not displayed.   GFR: Estimated Creatinine Clearance: 87.3 mL/min (by C-G formula based on SCr of 0.77 mg/dL). Liver Function Tests: Recent Labs  Lab 01/03/24 1633 01/04/24 0413 01/05/24 0505 01/06/24 0528  AST 90* 50* 35 28  ALT 56* 41 40 33  ALKPHOS 56 49 45 49  BILITOT 1.4* 0.8 0.7 0.9  PROT 6.8 5.7* 5.7* 5.7*  ALBUMIN 3.4* 2.6* 2.5* 2.5*   Recent Labs  Lab 01/03/24 1840  LIPASE 26   No results for input(s): AMMONIA in the last 168 hours. Coagulation Profile: No results for input(s): INR, PROTIME in the last 168 hours. Cardiac Enzymes: No results for input(s): CKTOTAL, CKMB, CKMBINDEX, TROPONINI in the last 168 hours. BNP (last 3 results) No results for input(s): PROBNP in the last 8760 hours. HbA1C: No results for input(s): HGBA1C in the last 72 hours. CBG: Recent Labs  Lab 01/09/24 1558 01/09/24 1939 01/09/24 2348 01/10/24 0405 01/10/24 0752  GLUCAP 288* 279* 155* 83 193*   Lipid Profile: No results for input(s): CHOL,  HDL, LDLCALC, TRIG, CHOLHDL, LDLDIRECT in the last 72 hours. Thyroid Function Tests: No results for input(s): TSH, T4TOTAL, FREET4, T3FREE, THYROIDAB in the last 72 hours. Anemia Panel: No results for input(s): VITAMINB12, FOLATE, FERRITIN, TIBC, IRON, RETICCTPCT in the last 72 hours. Sepsis Labs: Recent Labs  Lab 01/03/24 1738 01/03/24 1840 01/03/24 2048 01/04/24 0341  PROCALCITON  --  0.23  --   --   LATICACIDVEN 1.0  --  1.3 1.0    Recent Results (from the past 240 hours)  Resp panel by RT-PCR (RSV, Flu A&B, Covid) Anterior Nasal Swab     Status: Abnormal   Collection Time: 01/03/24  2:52 PM   Specimen: Anterior Nasal Swab  Result Value Ref Range Status   SARS Coronavirus 2 by RT PCR NEGATIVE NEGATIVE Final    Comment: (NOTE) SARS-CoV-2 target nucleic acids are NOT DETECTED.  The SARS-CoV-2 RNA is generally detectable in upper respiratory specimens during the acute phase of infection. The lowest concentration of SARS-CoV-2 viral copies this assay can detect is 138 copies/mL. A negative result does not preclude SARS-Cov-2 infection and should not be used as the sole basis for treatment or other patient management decisions. A negative result may occur with  improper specimen collection/handling, submission of specimen other than nasopharyngeal swab, presence of viral mutation(s) within the areas targeted by this assay, and inadequate number of viral copies(<138 copies/mL). A negative result must be combined with clinical observations, patient history, and epidemiological information. The expected result is Negative.  Fact Sheet for Patients:  bloggercourse.com  Fact Sheet for Healthcare Providers:  seriousbroker.it  This test is no t yet approved or cleared by the [place]  FDA and  has been authorized for detection and/or diagnosis of SARS-CoV-2 by FDA under an Emergency Use  Authorization (EUA). This EUA will remain  in effect (meaning this test can be used) for the duration of the COVID-19 declaration under Section 564(b)(1) of the Act, 21 U.S.C.section 360bbb-3(b)(1), unless the authorization is terminated  or revoked sooner.       Influenza A by PCR POSITIVE (A) NEGATIVE Final   Influenza B by PCR NEGATIVE NEGATIVE Final    Comment: (NOTE) The Xpert Xpress SARS-CoV-2/FLU/RSV plus assay is intended as an aid in the diagnosis of influenza from Nasopharyngeal swab specimens and should not be used as a sole basis for treatment. Nasal washings and aspirates are unacceptable for Xpert Xpress SARS-CoV-2/FLU/RSV testing.  Fact Sheet for Patients: bloggercourse.com  Fact Sheet for Healthcare Providers: seriousbroker.it  This test is not yet approved or cleared by the [place]  FDA and has been authorized for detection and/or diagnosis of SARS-CoV-2 by FDA under an Emergency Use Authorization (EUA). This EUA will remain in effect (meaning this test can be used) for the duration of the COVID-19 declaration under Section 564(b)(1) of the Act, 21 U.S.C. section 360bbb-3(b)(1), unless the authorization is terminated or revoked.     Resp Syncytial Virus by PCR NEGATIVE NEGATIVE Final    Comment: (NOTE) Fact Sheet for Patients: bloggercourse.com  Fact Sheet for Healthcare Providers: seriousbroker.it  This test is not yet approved or cleared by the [place]  FDA and has been authorized for detection and/or diagnosis of SARS-CoV-2 by FDA under an Emergency Use Authorization (EUA). This EUA will remain in effect (meaning this test can be used) for the duration of the COVID-19 declaration under Section 564(b)(1) of the Act, 21 U.S.C. section 360bbb-3(b)(1), unless the authorization is terminated or revoked.  Performed at [facility], [address] [place], [place] [address]   Blood culture (routine x 2)     Status: None   Collection Time: 01/03/24  5:43 PM   Specimen: BLOOD LEFT HAND  Result Value Ref Range Status   Specimen Description BLOOD LEFT HAND  Final   Special Requests   Final    BOTTLES DRAWN AEROBIC AND ANAEROBIC Blood Culture results may not be optimal due to an inadequate volume of blood received in culture bottles   Culture   Final    NO GROWTH 5 DAYS Performed at [facility], [address] [place], [place] [address]    Report Status 01/08/2024 FINAL  Final  Blood culture (routine x 2)     Status: None   Collection Time: 01/03/24  6:40 PM   Specimen: BLOOD RIGHT HAND  Result Value Ref Range Status   Specimen Description BLOOD RIGHT HAND  Final   Special Requests   Final    BOTTLES DRAWN AEROBIC AND ANAEROBIC Blood Culture results may not be optimal due to an inadequate volume of blood received in culture bottles   Culture   Final    NO GROWTH 5 DAYS Performed at [facility], [address] [place], [place] [address]    Report Status 01/08/2024 FINAL  Final  Culture, blood (Routine X 2) w Reflex to ID Panel     Status: None   Collection Time: 01/03/24  8:36 PM   Specimen: BLOOD  Result Value Ref Range Status   Specimen Description BLOOD RIGHT HAND  Final   Special Requests   Final    BOTTLES DRAWN AEROBIC ONLY Blood Culture results may not be optimal due to an inadequate volume of blood received in culture bottles   Culture   Final    NO GROWTH 5 DAYS Performed at [facility], [address] [place], [place] [address]    Report Status 01/08/2024 FINAL  Final  Culture, blood (Routine X 2) w Reflex to ID Panel     Status: None   Collection Time: 01/03/24  8:40 PM   Specimen: BLOOD  Result Value Ref Range Status   Specimen Description BLOOD BLOOD LEFT HAND  Final   Special Requests   Final    BOTTLES DRAWN AEROBIC AND ANAEROBIC Blood Culture results may not  be optimal due to an inadequate volume of blood received in culture bottles   Culture   Final    NO GROWTH 5 DAYS Performed at [facility], [address], [place], [place] [address]    Report Status 01/08/2024 FINAL  Final  MRSA Next Gen by PCR, Nasal     Status: None   Collection Time: 01/03/24  8:45 PM   Specimen: Nasal Mucosa; Nasal Swab  Result Value Ref Range Status   MRSA by PCR Next Gen NOT DETECTED NOT DETECTED Final    Comment: (NOTE) The GeneXpert MRSA Assay (FDA approved for NASAL specimens only), is one component of a comprehensive MRSA colonization surveillance program. It is not intended to diagnose MRSA infection nor to guide or monitor treatment for MRSA infections. Test performance is not FDA approved in patients less than 36 years old. Performed at [facility], [address] [place], [place] [address]   Respiratory (~20 pathogens) panel by PCR     Status: Abnormal   Collection Time: 01/04/24  5:28 PM   Specimen: Nasopharyngeal Swab; Respiratory  Result Value Ref Range Status   Adenovirus NOT DETECTED NOT DETECTED Final   Coronavirus 229E NOT DETECTED NOT DETECTED Final    Comment: (NOTE) The Coronavirus on the Respiratory Panel, DOES NOT test for the novel  Coronavirus (2019 nCoV)    Coronavirus HKU1 NOT DETECTED NOT DETECTED Final   Coronavirus NL63 NOT DETECTED  NOT DETECTED Final   Coronavirus OC43 NOT DETECTED NOT DETECTED Final   Metapneumovirus NOT DETECTED NOT DETECTED Final   Rhinovirus / Enterovirus NOT DETECTED NOT DETECTED Final   Influenza A H1 2009 DETECTED (A) NOT DETECTED Final   Influenza B NOT DETECTED NOT DETECTED Final   Parainfluenza Virus 1 NOT DETECTED NOT DETECTED Final   Parainfluenza Virus 2 NOT DETECTED NOT DETECTED Final   Parainfluenza Virus 3 NOT DETECTED NOT DETECTED Final   Parainfluenza Virus 4 NOT DETECTED NOT DETECTED Final   Respiratory Syncytial Virus NOT DETECTED NOT DETECTED Final   Bordetella  pertussis NOT DETECTED NOT DETECTED Final   Bordetella Parapertussis NOT DETECTED NOT DETECTED Final   Chlamydophila pneumoniae NOT DETECTED NOT DETECTED Final   Mycoplasma pneumoniae NOT DETECTED NOT DETECTED Final    Comment: Performed at [facility], [address], [place], [place] [address]         Radiology Studies: No results found.      Scheduled Meds:  amLODipine   10 mg Oral Daily   Chlorhexidine  Gluconate Cloth  6 each Topical Daily   enoxaparin  (LOVENOX ) injection  60 mg Subcutaneous Q24H   insulin  aspart  0-20 Units Subcutaneous Q4H   insulin  glargine-yfgn  20 Units Subcutaneous BID   multivitamin with minerals  1 tablet Oral Daily   nicotine   14 mg Transdermal Daily   mouth rinse  15 mL Mouth Rinse 4 times per day   Ensure Max Protein  11 oz Oral BID   sodium chloride  flush  10-40 mL Intracatheter Q12H   Continuous Infusions:   LOS: 7 days     Calvin KATHEE Robson, MD [facility]   If 7PM-7AM, please contact night-coverage  01/10/2024, 11:12 AM

## 2024-01-11 DIAGNOSIS — E871 Hypo-osmolality and hyponatremia: Secondary | ICD-10-CM | POA: Diagnosis not present

## 2024-01-11 LAB — BASIC METABOLIC PANEL
Anion gap: 8 (ref 5–15)
BUN: 19 mg/dL (ref 8–23)
CO2: 28 mmol/L (ref 22–32)
Calcium: 9.1 mg/dL (ref 8.9–10.3)
Chloride: 94 mmol/L — ABNORMAL LOW (ref 98–111)
Creatinine, Ser: 0.74 mg/dL (ref 0.44–1.00)
GFR, Estimated: >60 mL/min (ref >60)
Glucose, Bld: 208 mg/dL — ABNORMAL HIGH (ref 70–99)
Potassium: 4.5 mmol/L (ref 3.5–5.1)
Sodium: 130 mmol/L — ABNORMAL LOW (ref 135–145)

## 2024-01-11 LAB — GLUCOSE, CAPILLARY
Glucose-Capillary: 180 mg/dL — ABNORMAL HIGH (ref 70–99)
Glucose-Capillary: 196 mg/dL — ABNORMAL HIGH (ref 70–99)
Glucose-Capillary: 231 mg/dL — ABNORMAL HIGH (ref 70–99)
Glucose-Capillary: 239 mg/dL — ABNORMAL HIGH (ref 70–99)
Glucose-Capillary: 260 mg/dL — ABNORMAL HIGH (ref 70–99)
Glucose-Capillary: 317 mg/dL — ABNORMAL HIGH (ref 70–99)

## 2024-01-11 NOTE — Progress Notes (Signed)
 Occupational Therapy Treatment Patient Details Name: Jill Crosby MRN: [id number] DOB: Nov 20, 1961 Today's Date: 01/11/2024   History of present illness Pt is a 63 y.o. female presenting with severe hyponatremia and altered mental status. Patient is flu positive, extubated 2/4. PMH significant for T1DM, obesity, HTN.   OT comments  Chart reviewed, pt greeted with PT present. Pt presents with improved overall activity tolerance with pt amb  with RW CGA with chair follow in hallway on 3L via [place].Hand off from PT for further OT tx then provided. Pt performed LB dressing with MIN A. Discussed use of AE/DME for safe ADL completion as pt is considering discharging home. Per pt, she will have assist as needed from family. Discharge recommendations have been updated to reflect current level of functioning. OT will continue to follow.       If plan is discharge home, recommend the following:  A little help with walking and/or transfers;Assist for transportation;Help with stairs or ramp for entrance;Assistance with cooking/housework;A little help with bathing/dressing/bathroom   Equipment Recommendations  BSC/3in1;Other (comment);Tub/shower bench (2WW)    Recommendations for Other Services      Precautions / Restrictions Precautions Precautions: Fall Restrictions Weight Bearing Restrictions Per Provider Order: No       Mobility Bed Mobility               General bed mobility comments: NT in recliner pre/post session    Transfers Overall transfer level: Needs assistance Equipment used: Rolling walker (2 wheels) Transfers: Sit to/from Stand Sit to Stand: Supervision                 Balance Overall balance assessment: Needs assistance Sitting-balance support: Feet supported Sitting balance-Leahy Scale: Good     Standing balance support: Reliant on assistive device for balance, During functional activity Standing balance-Leahy Scale: Fair                              ADL either performed or assessed with clinical judgement   ADL Overall ADL's : Needs assistance/impaired Eating/Feeding: Set up;Sitting   Grooming: Sitting;Set up               Lower Body Dressing: Minimal assistance;Sitting/lateral leans Lower Body Dressing Details (indicate cue type and reason): donn/doff socks; discussed use of AE for energy conservation Toilet Transfer: Supervision/safety;Contact guard assist;Ambulation;Rolling walker (2 wheels) Toilet Transfer Details (indicate cue type and reason): simulated         Functional mobility during ADLs: Supervision/safety;Contact guard assist;Rolling walker (2 wheels)      Extremity/Trunk Assessment              Vision       Perception     Praxis      Cognition Arousal: Alert Behavior During Therapy: WFL for tasks assessed/performed Overall Cognitive Status: Within Functional Limits for tasks assessed                                          Exercises Other Exercises Other Exercises: edu pt and sister re: role of OT, role of rehab, DME recommendations, safe ADL completion with/without AE    Shoulder Instructions       General Comments spo2 >90% on 3L via [place] during mobility, HR 130 bpm after mobility    Pertinent Vitals/ Pain  Home Living                                          Prior Functioning/Environment              Frequency  Min 1X/week        Progress Toward Goals  OT Goals(current goals can now be found in the care plan section)  Progress towards OT goals: Progressing toward goals  Acute Rehab OT Goals Time For Goal Achievement: 01/21/24  Plan      Co-evaluation                 AM-PAC OT "6 Clicks" Daily Activity     Outcome Measure   Help from another person eating meals?: None Help from another person taking care of personal grooming?: None Help from another person toileting, which includes using toliet,  bedpan, or urinal?: A Little Help from another person bathing (including washing, rinsing, drying)?: A Little Help from another person to put on and taking off regular upper body clothing?: None Help from another person to put on and taking off regular lower body clothing?: A Little 6 Click Score: 21    End of Session Equipment Utilized During Treatment: Rolling walker (2 wheels)  OT Visit Diagnosis: Other abnormalities of gait and mobility (R26.89);Muscle weakness (generalized) (M62.81)   Activity Tolerance Patient tolerated treatment well   Patient Left in chair;with call bell/phone within reach;with family/visitor present;with chair alarm set   Nurse Communication Mobility status        Time: 1610-9604 OT Time Calculation (min): 19 min  Charges: OT General Charges $OT Visit: 1 Visit OT Treatments $Therapeutic Activity: 8-22 mins  Gerre Kraft, OTD OTR/L  01/11/24, 1:14 PM

## 2024-01-11 NOTE — Progress Notes (Signed)
 PROGRESS NOTE    Jill Crosby  ZOX:[id number] DOB: [birth year] DOA: 01/03/2024 PCP: Dellar Fenton, MD    Brief Narrative:   63 year old female with a history of type 1 diabetes since age 31, previously on insulin  pump which was switched to subcutaneous injections years ago.  She had an URTI recently with respiratory symptoms and worsening encephalopathy prompting presentation to the hospital.   History was obtained from family members who provided collateral information given intubation and mechanical ventilation. Additional background medical history includes essential hypertension, dyslipidemia, anxiety disorder, history of smoking, arthritis, morbid obesity and glaucoma. She presents with N/V and altered mental status after coming down with a cold over a week ago. She tested positive for influenza A in the ED. BMP showed hyperglycemia at 300 and severe hyponatremia and undetectably low levels less than 102 with hypochloremia.  She was encephalopathic and agitated and was intubated in the ED. PCCM was consulted for admission for management of encephalopathy, respiratory failure, and hyponatremia.  Extubated successfully on 2/4.  Remained stable on nasal cannula.  Sodium improving mental status improving.   Assessment & Plan:   Principal Problem:   Hyponatremia  Severe hyponatremia Initially presented with sodium less than 102.  Sodium corrected by 60 mEq a day.  Most recently 130 Plan: Fluid restrict Daily BMP  Influenza pneumonia Extubated successfully On nasal cannula Will dc on home oxygen  Acute metabolic encephalopathy Likely secondary to infection and hyponatremia Mental status baseline  Essential hypertension PTA Norvasc   Insulin -dependent diabetes mellitus Basal bolus regimen Carb modified diet  Class III obesity BMI 42.5.  Complicating factor in overall care and prognosis.   DVT prophylaxis: SQ lovenox  Code Status: FULL Family Communication:Son at bedside  2/9, 2/10 Disposition Plan: Status is: Inpatient Remains inpatient appropriate because: Severe hyponatremia   Level of care: Med-Surg  Consultants:  Nephrology  Procedures:  None  Antimicrobials: None    Subjective: Seen and examined.  Sitting up in chair.  Fatigued, otherwise stable.  Sister and son at bedside.  Objective: Vitals:   01/10/24 0909 01/10/24 1630 01/11/24 0140 01/11/24 0808  BP: (!) 155/58 (!) 144/49 (!) 137/53 (!) 150/66  Pulse: 85 91 87 (!) 109  Resp:  20 20 16   Temp:  97.6 F (36.4 C) 98.1 F (36.7 C) 98.2 F (36.8 C)  TempSrc:      SpO2: 96% 95% 95% 97%  Weight:      Height:        Intake/Output Summary (Last 24 hours) at 01/11/2024 1340 Last data filed at 01/10/2024 2250 Gross per 24 hour  Intake 540 ml  Output --  Net 540 ml   Filed Weights   01/08/24 0446 01/09/24 0707 01/10/24 0630  Weight: 110.2 kg 109.4 kg 112.4 kg    Examination:  General exam: NAD, appears fatigued Respiratory system: Bibasilar crackles, normal WOB, 2L Cardiovascular system: S1-S2, RRR, no murmurs, no pedal edema Gastrointestinal system: Obese, soft, NT/ND, bowel sounds Central nervous system: Alert and oriented. No focal neurological deficits. Extremities: Symmetric 5 x 5 power. Skin: No rashes, lesions or ulcers Psychiatry: Judgement and insight appear normal. Mood & affect appropriate.     Data Reviewed: I have personally reviewed following labs and imaging studies  CBC: Recent Labs  Lab 01/05/24 0505 01/06/24 0528 01/07/24 0503 01/08/24 0524 01/10/24 0325  WBC 30.0* 21.2* 15.0* 15.0* 13.8*  HGB 10.3* 10.4* 10.4* 10.7* 10.0*  HCT 28.2* 28.7* 30.1* 31.6* 30.2*  MCV 82.0 82.9 85.5 86.6 89.3  PLT  340 367 382 413* 390   Basic Metabolic Panel: Recent Labs  Lab 01/06/24 0528 01/06/24 0645 01/07/24 0503 01/07/24 0631 01/07/24 2146 01/08/24 0128 01/08/24 0524 01/09/24 0422 01/09/24 1215 01/09/24 2006 01/10/24 0325 01/11/24 0428  NA 120*   <  > 125*   < > 127*  127* 131*   < > 131* 130* 130* 133* 130*  K 3.5  --  3.2*  --  3.4* 3.5  --  4.2  --   --  4.6 4.5  CL 84*  --  86*  --  87* 90*  --  90*  --   --  92* 94*  CO2 29  --  31  --  29 31  --  31  --   --  31 28  GLUCOSE 94  --  75  --  273* 140*  --  125*  --   --  111* 208*  BUN 10  --  10  --  13 12  --  13  --   --  17 19  CREATININE 0.63  --  0.63  --  0.77 0.72  --  0.71  --   --  0.77 0.74  CALCIUM  8.3*  --  8.4*  --  8.7* 8.7*  --  9.0  --   --  9.1 9.1  MG 2.2  --  2.2  --   --  2.2  --  2.2  --   --  2.3  --   PHOS 2.3*  --  2.9  --   --  4.0  --  4.8*  --   --  5.4*  --    < > = values in this interval not displayed.   GFR: Estimated Creatinine Clearance: 87.3 mL/min (by C-G formula based on SCr of 0.74 mg/dL). Liver Function Tests: Recent Labs  Lab 01/05/24 0505 01/06/24 0528  AST 35 28  ALT 40 33  ALKPHOS 45 49  BILITOT 0.7 0.9  PROT 5.7* 5.7*  ALBUMIN 2.5* 2.5*   No results for input(s): "LIPASE", "AMYLASE" in the last 168 hours.  No results for input(s): "AMMONIA" in the last 168 hours. Coagulation Profile: No results for input(s): "INR", "PROTIME" in the last 168 hours. Cardiac Enzymes: No results for input(s): "CKTOTAL", "CKMB", "CKMBINDEX", "TROPONINI" in the last 168 hours. BNP (last 3 results) No results for input(s): "PROBNP" in the last 8760 hours. HbA1C: No results for input(s): "HGBA1C" in the last 72 hours. CBG: Recent Labs  Lab 01/10/24 1752 01/10/24 1818 01/10/24 2014 01/11/24 0816 01/11/24 1234  GLUCAP 426* 379* 234* 260* 180*   Lipid Profile: No results for input(s): "CHOL", "HDL", "LDLCALC", "TRIG", "CHOLHDL", "LDLDIRECT" in the last 72 hours. Thyroid Function Tests: No results for input(s): "TSH", "T4TOTAL", "FREET4", "T3FREE", "THYROIDAB" in the last 72 hours. Anemia Panel: No results for input(s): "VITAMINB12", "FOLATE", "FERRITIN", "TIBC", "IRON", "RETICCTPCT" in the last 72 hours. Sepsis Labs: No results for  input(s): "PROCALCITON", "LATICACIDVEN" in the last 168 hours.   Recent Results (from the past 240 hours)  Resp panel by RT-PCR (RSV, Flu A&B, Covid) Anterior Nasal Swab     Status: Abnormal   Collection Time: 01/03/24  2:52 PM   Specimen: Anterior Nasal Swab  Result Value Ref Range Status   SARS Coronavirus 2 by RT PCR NEGATIVE NEGATIVE Final    Comment: (NOTE) SARS-CoV-2 target nucleic acids are NOT DETECTED.  The SARS-CoV-2 RNA is generally detectable in upper respiratory specimens during the  acute phase of infection. The lowest concentration of SARS-CoV-2 viral copies this assay can detect is 138 copies/mL. A negative result does not preclude SARS-Cov-2 infection and should not be used as the sole basis for treatment or other patient management decisions. A negative result may occur with  improper specimen collection/handling, submission of specimen other than nasopharyngeal swab, presence of viral mutation(s) within the areas targeted by this assay, and inadequate number of viral copies(<138 copies/mL). A negative result must be combined with clinical observations, patient history, and epidemiological information. The expected result is Negative.  Fact Sheet for Patients:  BloggerCourse.com  Fact Sheet for Healthcare Providers:  SeriousBroker.it  This test is no t yet approved or cleared by the [place]  FDA and  has been authorized for detection and/or diagnosis of SARS-CoV-2 by FDA under an Emergency Use Authorization (EUA). This EUA will remain  in effect (meaning this test can be used) for the duration of the COVID-19 declaration under Section 564(b)(1) of the Act, 21 U.S.C.section 360bbb-3(b)(1), unless the authorization is terminated  or revoked sooner.       Influenza A by PCR POSITIVE (A) NEGATIVE Final   Influenza B by PCR NEGATIVE NEGATIVE Final    Comment: (NOTE) The Xpert Xpress SARS-CoV-2/FLU/RSV plus  assay is intended as an aid in the diagnosis of influenza from Nasopharyngeal swab specimens and should not be used as a sole basis for treatment. Nasal washings and aspirates are unacceptable for Xpert Xpress SARS-CoV-2/FLU/RSV testing.  Fact Sheet for Patients: BloggerCourse.com  Fact Sheet for Healthcare Providers: SeriousBroker.it  This test is not yet approved or cleared by the [place]  FDA and has been authorized for detection and/or diagnosis of SARS-CoV-2 by FDA under an Emergency Use Authorization (EUA). This EUA will remain in effect (meaning this test can be used) for the duration of the COVID-19 declaration under Section 564(b)(1) of the Act, 21 U.S.C. section 360bbb-3(b)(1), unless the authorization is terminated or revoked.     Resp Syncytial Virus by PCR NEGATIVE NEGATIVE Final    Comment: (NOTE) Fact Sheet for Patients: BloggerCourse.com  Fact Sheet for Healthcare Providers: SeriousBroker.it  This test is not yet approved or cleared by the [place]  FDA and has been authorized for detection and/or diagnosis of SARS-CoV-2 by FDA under an Emergency Use Authorization (EUA). This EUA will remain in effect (meaning this test can be used) for the duration of the COVID-19 declaration under Section 564(b)(1) of the Act, 21 U.S.C. section 360bbb-3(b)(1), unless the authorization is terminated or revoked.  Performed at [facility], [address] [place], [place] [address]   Blood culture (routine x 2)     Status: None   Collection Time: 01/03/24  5:43 PM   Specimen: BLOOD LEFT HAND  Result Value Ref Range Status   Specimen Description BLOOD LEFT HAND  Final   Special Requests   Final    BOTTLES DRAWN AEROBIC AND ANAEROBIC Blood Culture results may not be optimal due to an inadequate volume of blood received in culture bottles   Culture   Final     NO GROWTH 5 DAYS Performed at [facility], [address] [place], [place] [address]    Report Status 01/08/2024 FINAL  Final  Blood culture (routine x 2)     Status: None   Collection Time: 01/03/24  6:40 PM   Specimen: BLOOD RIGHT HAND  Result Value Ref Range Status   Specimen Description BLOOD RIGHT HAND  Final   Special Requests  Final    BOTTLES DRAWN AEROBIC AND ANAEROBIC Blood Culture results may not be optimal due to an inadequate volume of blood received in culture bottles   Culture   Final    NO GROWTH 5 DAYS Performed at [facility], [address] [place], [place] [address]    Report Status 01/08/2024 FINAL  Final  Culture, blood (Routine X 2) w Reflex to ID Panel     Status: None   Collection Time: 01/03/24  8:36 PM   Specimen: BLOOD  Result Value Ref Range Status   Specimen Description BLOOD RIGHT HAND  Final   Special Requests   Final    BOTTLES DRAWN AEROBIC ONLY Blood Culture results may not be optimal due to an inadequate volume of blood received in culture bottles   Culture   Final    NO GROWTH 5 DAYS Performed at [facility], [address] [place], [place] [address]    Report Status 01/08/2024 FINAL  Final  Culture, blood (Routine X 2) w Reflex to ID Panel     Status: None   Collection Time: 01/03/24  8:40 PM   Specimen: BLOOD  Result Value Ref Range Status   Specimen Description BLOOD BLOOD LEFT HAND  Final   Special Requests   Final    BOTTLES DRAWN AEROBIC AND ANAEROBIC Blood Culture results may not be optimal due to an inadequate volume of blood received in culture bottles   Culture   Final    NO GROWTH 5 DAYS Performed at [facility], [address], [place], [place] [address]    Report Status 01/08/2024 FINAL  Final  MRSA Next Gen by PCR, Nasal     Status: None   Collection Time: 01/03/24  8:45 PM   Specimen: Nasal Mucosa; Nasal Swab  Result Value Ref Range Status   MRSA by PCR Next Gen NOT  DETECTED NOT DETECTED Final    Comment: (NOTE) The GeneXpert MRSA Assay (FDA approved for NASAL specimens only), is one component of a comprehensive MRSA colonization surveillance program. It is not intended to diagnose MRSA infection nor to guide or monitor treatment for MRSA infections. Test performance is not FDA approved in patients less than 22 years old. Performed at [facility], [address] [place], [place] [address]   Respiratory (~20 pathogens) panel by PCR     Status: Abnormal   Collection Time: 01/04/24  5:28 PM   Specimen: Nasopharyngeal Swab; Respiratory  Result Value Ref Range Status   Adenovirus NOT DETECTED NOT DETECTED Final   Coronavirus 229E NOT DETECTED NOT DETECTED Final    Comment: (NOTE) The Coronavirus on the Respiratory Panel, DOES NOT test for the novel  Coronavirus (2019 nCoV)    Coronavirus HKU1 NOT DETECTED NOT DETECTED Final   Coronavirus NL63 NOT DETECTED NOT DETECTED Final   Coronavirus OC43 NOT DETECTED NOT DETECTED Final   Metapneumovirus NOT DETECTED NOT DETECTED Final   Rhinovirus / Enterovirus NOT DETECTED NOT DETECTED Final   Influenza A H1 2009 DETECTED (A) NOT DETECTED Final   Influenza B NOT DETECTED NOT DETECTED Final   Parainfluenza Virus 1 NOT DETECTED NOT DETECTED Final   Parainfluenza Virus 2 NOT DETECTED NOT DETECTED Final   Parainfluenza Virus 3 NOT DETECTED NOT DETECTED Final   Parainfluenza Virus 4 NOT DETECTED NOT DETECTED Final   Respiratory Syncytial Virus NOT DETECTED NOT DETECTED Final   Bordetella pertussis NOT DETECTED NOT DETECTED Final   Bordetella Parapertussis NOT DETECTED  NOT DETECTED Final   Chlamydophila pneumoniae NOT DETECTED NOT DETECTED Final   Mycoplasma pneumoniae NOT DETECTED NOT DETECTED Final    Comment: Performed at [facility], [address] [place], [place] [address]         Radiology Studies: No results found.      Scheduled Meds:  amLODipine   10 mg Oral Daily    Chlorhexidine  Gluconate Cloth  6 each Topical Daily   enoxaparin  (LOVENOX ) injection  60 mg Subcutaneous Q24H   insulin  aspart  0-20 Units Subcutaneous Q4H   insulin  glargine-yfgn  20 Units Subcutaneous BID   multivitamin with minerals  1 tablet Oral Daily   nicotine   14 mg Transdermal Daily   mouth rinse  15 mL Mouth Rinse 4 times per day   Ensure Max Protein  11 oz Oral BID   sodium chloride  flush  10-40 mL Intracatheter Q12H   Continuous Infusions:   LOS: 8 days     Tiajuana Fluke, MD [facility]   If 7PM-7AM, please contact night-coverage  01/11/2024, 1:40 PM

## 2024-01-11 NOTE — Progress Notes (Signed)
 Physical Therapy Treatment Patient Details Name: Jill Crosby MRN: [id number] DOB: Apr 27, 1961 Today's Date: 01/11/2024   History of Present Illness Pt is a 63 y.o. female presenting with severe hyponatremia and altered mental status. Patient is flu positive, extubated 2/4. PMH significant for T1DM, obesity, HTN.    PT Comments  Amb to and from bathroom on room air.  Sats decrease to 86% with short distance.  She is able to compete standing ex with walker support then walk in hallway with 3 lpm 100' then 23' with RW and cga x 1 with sats remaining 97%.  Sister in and discussion DC plan.  Pt wishes to return home and based on improved mobility it is reasonable at this time.  Discussed with team.  SaO2 on room air at rest = 94% SaO2 on room air while ambulating = 86% SaO2 on 3 liters of O2 while ambulating = 96%    If plan is discharge home, recommend the following: A little help with walking and/or transfers;A little help with bathing/dressing/bathroom;Assistance with cooking/housework;Assist for transportation;Help with stairs or ramp for entrance   Can travel by private vehicle        Equipment Recommendations       Recommendations for Other Services       Precautions / Restrictions Precautions Precautions: Fall Restrictions Weight Bearing Restrictions Per Provider Order: No     Mobility  Bed Mobility               General bed mobility comments: in chair before and after Patient Response: Cooperative  Transfers Overall transfer level: Needs assistance Equipment used: Rolling walker (2 wheels) Transfers: Sit to/from Stand Sit to Stand: Supervision                Ambulation/Gait Ambulation/Gait assistance: Contact guard assist Gait Distance (Feet): 90 Feet Assistive device: Rolling walker (2 wheels) Gait Pattern/deviations: Step-to pattern, Decreased stride length, Trunk flexed, Wide base of support Gait velocity: decreased     General Gait Details:  lateral sway but no LOB's noted   Stairs             Wheelchair Mobility     Tilt Bed Tilt Bed Patient Response: Cooperative  Modified Rankin (Stroke Patients Only)       Balance Overall balance assessment: Needs assistance Sitting-balance support: Feet supported Sitting balance-Leahy Scale: Good     Standing balance support: Reliant on assistive device for balance, During functional activity Standing balance-Leahy Scale: Fair                              Cognition Arousal: Alert Behavior During Therapy: WFL for tasks assessed/performed Overall Cognitive Status: Within Functional Limits for tasks assessed                                          Exercises Other Exercises Other Exercises: standing ex with walker support, 10x sit to stand and bathroom to void where she does her own self care    General Comments        Pertinent Vitals/Pain Pain Assessment Pain Assessment: No/denies pain    Home Living                          Prior Function  PT Goals (current goals can now be found in the care plan section) Progress towards PT goals: Progressing toward goals    Frequency    Min 1X/week      PT Plan      Co-evaluation              AM-PAC PT "6 Clicks" Mobility   Outcome Measure  Help needed turning from your back to your side while in a flat bed without using bedrails?: None Help needed moving from lying on your back to sitting on the side of a flat bed without using bedrails?: None Help needed moving to and from a bed to a chair (including a wheelchair)?: None Help needed standing up from a chair using your arms (e.g., wheelchair or bedside chair)?: None Help needed to walk in hospital room?: A Little Help needed climbing 3-5 steps with a railing? : A Little 6 Click Score: 22    End of Session Equipment Utilized During Treatment: Gait belt;Oxygen Activity Tolerance: Patient  tolerated treatment well Patient left: in chair;with call bell/phone within reach;with nursing/sitter in room;with family/visitor present;with chair alarm set Nurse Communication: Mobility status PT Visit Diagnosis: Other abnormalities of gait and mobility (R26.89);Difficulty in walking, not elsewhere classified (R26.2);Muscle weakness (generalized) (M62.81)     Time: 1610-9604 PT Time Calculation (min) (ACUTE ONLY): 29 min  Charges:    $Gait Training: 23-37 mins PT General Charges $$ ACUTE PT VISIT: 1 Visit                   Charlanne Cong, PTA 01/11/24, 11:05 AM

## 2024-01-11 NOTE — Progress Notes (Signed)
 [facility]  ROUNDING NOTE   Subjective:   Patient seen sitting up in bed Alert and oriented Adequate appetite  Na 130  Objective:  Vital signs in last 24 hours:  Temp:  [97.6 F (36.4 C)-98.2 F (36.8 C)] 98.2 F (36.8 C) (02/10 0808) Pulse Rate:  [87-109] 109 (02/10 0808) Resp:  [16-20] 16 (02/10 0808) BP: (137-150)/(49-66) 150/66 (02/10 0808) SpO2:  [95 %-97 %] 97 % (02/10 0808)  Weight change:  Filed Weights   01/08/24 0446 01/09/24 0707 01/10/24 0630  Weight: 110.2 kg 109.4 kg 112.4 kg    Intake/Output: I/O last 3 completed shifts: In: 540 [P.O.:540] Out: 2050 [Urine:2050]   Intake/Output this shift:  No intake/output data recorded.  Physical Exam: General: No acute distress  Head: Normocephalic, atraumatic. Moist oral mucosal membranes  Lungs:  clear  Heart: regular  Abdomen:  Soft, nontender, bowel sounds present  Extremities: Trace peripheral edema.  Neurologic: Arousable  Skin: No acute rash  Access: No hemodialysis access    Basic Metabolic Panel: Recent Labs  Lab 01/06/24 0528 01/06/24 0645 01/07/24 0503 01/07/24 0631 01/07/24 2146 01/08/24 0128 01/08/24 6578 01/09/24 0422 01/09/24 1215 01/09/24 2006 01/10/24 0325 01/11/24 0428  NA 120*   < > 125*   < > 127*  127* 131*   < > 131* 130* 130* 133* 130*  K 3.5  --  3.2*  --  3.4* 3.5  --  4.2  --   --  4.6 4.5  CL 84*  --  86*  --  87* 90*  --  90*  --   --  92* 94*  CO2 29  --  31  --  29 31  --  31  --   --  31 28  GLUCOSE 94  --  75  --  273* 140*  --  125*  --   --  111* 208*  BUN 10  --  10  --  13 12  --  13  --   --  17 19  CREATININE 0.63  --  0.63  --  0.77 0.72  --  0.71  --   --  0.77 0.74  CALCIUM  8.3*  --  8.4*  --  8.7* 8.7*  --  9.0  --   --  9.1 9.1  MG 2.2  --  2.2  --   --  2.2  --  2.2  --   --  2.3  --   PHOS 2.3*  --  2.9  --   --  4.0  --  4.8*  --   --  5.4*  --    < > = values in this interval not displayed.    Liver Function Tests: Recent Labs   Lab 01/05/24 0505 01/06/24 0528  AST 35 28  ALT 40 33  ALKPHOS 45 49  BILITOT 0.7 0.9  PROT 5.7* 5.7*  ALBUMIN 2.5* 2.5*   No results for input(s): "LIPASE", "AMYLASE" in the last 168 hours.  No results for input(s): "AMMONIA" in the last 168 hours.  CBC: Recent Labs  Lab 01/05/24 0505 01/06/24 0528 01/07/24 0503 01/08/24 0524 01/10/24 0325  WBC 30.0* 21.2* 15.0* 15.0* 13.8*  HGB 10.3* 10.4* 10.4* 10.7* 10.0*  HCT 28.2* 28.7* 30.1* 31.6* 30.2*  MCV 82.0 82.9 85.5 86.6 89.3  PLT 340 367 382 413* 390    Cardiac Enzymes: No results for input(s): "CKTOTAL", "CKMB", "CKMBINDEX", "TROPONINI" in the last 168 hours.  BNP:  Invalid input(s): "POCBNP"  CBG: Recent Labs  Lab 01/10/24 1752 01/10/24 1818 01/10/24 2014 01/11/24 0816 01/11/24 1234  GLUCAP 426* 379* 234* 260* 180*    Microbiology: Results for orders placed or performed during the hospital encounter of 01/03/24  Resp panel by RT-PCR (RSV, Flu A&B, Covid) Anterior Nasal Swab     Status: Abnormal   Collection Time: 01/03/24  2:52 PM   Specimen: Anterior Nasal Swab  Result Value Ref Range Status   SARS Coronavirus 2 by RT PCR NEGATIVE NEGATIVE Final    Comment: (NOTE) SARS-CoV-2 target nucleic acids are NOT DETECTED.  The SARS-CoV-2 RNA is generally detectable in upper respiratory specimens during the acute phase of infection. The lowest concentration of SARS-CoV-2 viral copies this assay can detect is 138 copies/mL. A negative result does not preclude SARS-Cov-2 infection and should not be used as the sole basis for treatment or other patient management decisions. A negative result may occur with  improper specimen collection/handling, submission of specimen other than nasopharyngeal swab, presence of viral mutation(s) within the areas targeted by this assay, and inadequate number of viral copies(<138 copies/mL). A negative result must be combined with clinical observations, patient history, and  epidemiological information. The expected result is Negative.  Fact Sheet for Patients:  BloggerCourse.com  Fact Sheet for Healthcare Providers:  SeriousBroker.it  This test is no t yet approved or cleared by the [place]  FDA and  has been authorized for detection and/or diagnosis of SARS-CoV-2 by FDA under an Emergency Use Authorization (EUA). This EUA will remain  in effect (meaning this test can be used) for the duration of the COVID-19 declaration under Section 564(b)(1) of the Act, 21 U.S.C.section 360bbb-3(b)(1), unless the authorization is terminated  or revoked sooner.       Influenza A by PCR POSITIVE (A) NEGATIVE Final   Influenza B by PCR NEGATIVE NEGATIVE Final    Comment: (NOTE) The Xpert Xpress SARS-CoV-2/FLU/RSV plus assay is intended as an aid in the diagnosis of influenza from Nasopharyngeal swab specimens and should not be used as a sole basis for treatment. Nasal washings and aspirates are unacceptable for Xpert Xpress SARS-CoV-2/FLU/RSV testing.  Fact Sheet for Patients: BloggerCourse.com  Fact Sheet for Healthcare Providers: SeriousBroker.it  This test is not yet approved or cleared by the [place]  FDA and has been authorized for detection and/or diagnosis of SARS-CoV-2 by FDA under an Emergency Use Authorization (EUA). This EUA will remain in effect (meaning this test can be used) for the duration of the COVID-19 declaration under Section 564(b)(1) of the Act, 21 U.S.C. section 360bbb-3(b)(1), unless the authorization is terminated or revoked.     Resp Syncytial Virus by PCR NEGATIVE NEGATIVE Final    Comment: (NOTE) Fact Sheet for Patients: BloggerCourse.com  Fact Sheet for Healthcare Providers: SeriousBroker.it  This test is not yet approved or cleared by the [place]  FDA and has  been authorized for detection and/or diagnosis of SARS-CoV-2 by FDA under an Emergency Use Authorization (EUA). This EUA will remain in effect (meaning this test can be used) for the duration of the COVID-19 declaration under Section 564(b)(1) of the Act, 21 U.S.C. section 360bbb-3(b)(1), unless the authorization is terminated or revoked.  Performed at [facility], [address] [place], [place] [address]   Blood culture (routine x 2)     Status: None   Collection Time: 01/03/24  5:43 PM   Specimen: BLOOD LEFT HAND  Result Value Ref Range Status   Specimen Description BLOOD  LEFT HAND  Final   Special Requests   Final    BOTTLES DRAWN AEROBIC AND ANAEROBIC Blood Culture results may not be optimal due to an inadequate volume of blood received in culture bottles   Culture   Final    NO GROWTH 5 DAYS Performed at [facility], [address] [place], [place] [address]    Report Status 01/08/2024 FINAL  Final  Blood culture (routine x 2)     Status: None   Collection Time: 01/03/24  6:40 PM   Specimen: BLOOD RIGHT HAND  Result Value Ref Range Status   Specimen Description BLOOD RIGHT HAND  Final   Special Requests   Final    BOTTLES DRAWN AEROBIC AND ANAEROBIC Blood Culture results may not be optimal due to an inadequate volume of blood received in culture bottles   Culture   Final    NO GROWTH 5 DAYS Performed at [facility], [address] [place], [place] [address]    Report Status 01/08/2024 FINAL  Final  Culture, blood (Routine X 2) w Reflex to ID Panel     Status: None   Collection Time: 01/03/24  8:36 PM   Specimen: BLOOD  Result Value Ref Range Status   Specimen Description BLOOD RIGHT HAND  Final   Special Requests   Final    BOTTLES DRAWN AEROBIC ONLY Blood Culture results may not be optimal due to an inadequate volume of blood received in culture bottles   Culture   Final    NO GROWTH 5 DAYS Performed at [facility], [address] [place], [place] [address]    Report Status 01/08/2024 FINAL  Final  Culture, blood (Routine X 2) w Reflex to ID Panel     Status: None   Collection Time: 01/03/24  8:40 PM   Specimen: BLOOD  Result Value Ref Range Status   Specimen Description BLOOD BLOOD LEFT HAND  Final   Special Requests   Final    BOTTLES DRAWN AEROBIC AND ANAEROBIC Blood Culture results may not be optimal due to an inadequate volume of blood received in culture bottles   Culture   Final    NO GROWTH 5 DAYS Performed at [facility], [address], [place], [place] [address]    Report Status 01/08/2024 FINAL  Final  MRSA Next Gen by PCR, Nasal     Status: None   Collection Time: 01/03/24  8:45 PM   Specimen: Nasal Mucosa; Nasal Swab  Result Value Ref Range Status   MRSA by PCR Next Gen NOT DETECTED NOT DETECTED Final    Comment: (NOTE) The GeneXpert MRSA Assay (FDA approved for NASAL specimens only), is one component of a comprehensive MRSA colonization surveillance program. It is not intended to diagnose MRSA infection nor to guide or monitor treatment for MRSA infections. Test performance is not FDA approved in patients less than 70 years old. Performed at [facility], [address] [place], [place] [address]   Respiratory (~20 pathogens) panel by PCR     Status: Abnormal   Collection Time: 01/04/24  5:28 PM   Specimen: Nasopharyngeal Swab; Respiratory  Result Value Ref Range Status   Adenovirus NOT DETECTED NOT DETECTED Final   Coronavirus 229E NOT DETECTED NOT DETECTED Final    Comment: (NOTE) The Coronavirus on the Respiratory Panel, DOES NOT test for the novel  Coronavirus (2019 nCoV)    Coronavirus HKU1 NOT DETECTED NOT DETECTED Final   Coronavirus  NL63 NOT DETECTED NOT DETECTED Final   Coronavirus OC43 NOT DETECTED NOT DETECTED Final   Metapneumovirus NOT DETECTED NOT DETECTED Final   Rhinovirus / Enterovirus NOT DETECTED NOT DETECTED Final   Influenza A H1  2009 DETECTED (A) NOT DETECTED Final   Influenza B NOT DETECTED NOT DETECTED Final   Parainfluenza Virus 1 NOT DETECTED NOT DETECTED Final   Parainfluenza Virus 2 NOT DETECTED NOT DETECTED Final   Parainfluenza Virus 3 NOT DETECTED NOT DETECTED Final   Parainfluenza Virus 4 NOT DETECTED NOT DETECTED Final   Respiratory Syncytial Virus NOT DETECTED NOT DETECTED Final   Bordetella pertussis NOT DETECTED NOT DETECTED Final   Bordetella Parapertussis NOT DETECTED NOT DETECTED Final   Chlamydophila pneumoniae NOT DETECTED NOT DETECTED Final   Mycoplasma pneumoniae NOT DETECTED NOT DETECTED Final    Comment: Performed at [facility], [address], [place], [place] [address]    Coagulation Studies: No results for input(s): "LABPROT", "INR" in the last 72 hours.  Urinalysis: No results for input(s): "COLORURINE", "LABSPEC", "PHURINE", "GLUCOSEU", "HGBUR", "BILIRUBINUR", "KETONESUR", "PROTEINUR", "UROBILINOGEN", "NITRITE", "LEUKOCYTESUR" in the last 72 hours.  Invalid input(s): "APPERANCEUR"     Imaging: No results found.    Medications:      amLODipine   10 mg Oral Daily   Chlorhexidine  Gluconate Cloth  6 each Topical Daily   enoxaparin  (LOVENOX ) injection  60 mg Subcutaneous Q24H   insulin  aspart  0-20 Units Subcutaneous Q4H   insulin  glargine-yfgn  20 Units Subcutaneous BID   multivitamin with minerals  1 tablet Oral Daily   nicotine   14 mg Transdermal Daily   mouth rinse  15 mL Mouth Rinse 4 times per day   Ensure Max Protein  11 oz Oral BID   sodium chloride  flush  10-40 mL Intracatheter Q12H   dextrose , hydrALAZINE , lip balm, mouth rinse, polyethylene glycol  Assessment/ Plan:  63 y.o. female with past medical history of diabetes mellitus type 1, hypertension, congestive heart failure, obesity, depression, hyperlipidemia who presented to emergency department with cough, shortness of breath, decreased p.o. intake as well as nausea and vomiting.  Found to be  influenza A positive with respiratory distress and initially required mechanical ventilation.  1.  Hyponatremia.  Appears to be multifactorial.  Patient was on HCTZ as well as SSRIs at home.   Status post: 3% saline  -Sodium remains stable, 130. - Continue fluid restriction.  2.  Acute respiratory failure.  Patient successfully weaned off of ventilator and BiPAP.  Weaned to room air     LOS: 8 [employer] 2/10/20251:28 PM

## 2024-01-11 NOTE — Progress Notes (Signed)
 Pt refused her blood sugars to be done for the night. Pt was educated.

## 2024-01-11 NOTE — Plan of Care (Signed)
  Problem: Education: Goal: Knowledge of General Education information will improve Description: Including pain rating scale, medication(s)/side effects and non-pharmacologic comfort measures Outcome: Progressing   Problem: Health Behavior/Discharge Planning: Goal: Ability to manage health-related needs will improve Outcome: Progressing   Problem: Clinical Measurements: Goal: Ability to maintain clinical measurements within normal limits will improve Outcome: Progressing Goal: Will remain free from infection Outcome: Progressing Goal: Diagnostic test results will improve Outcome: Progressing Goal: Respiratory complications will improve Outcome: Progressing Goal: Cardiovascular complication will be avoided Outcome: Progressing   Problem: Activity: Goal: Risk for activity intolerance will decrease Outcome: Progressing   Problem: Nutrition: Goal: Adequate nutrition will be maintained Outcome: Progressing   Problem: Coping: Goal: Level of anxiety will decrease Outcome: Progressing   Problem: Elimination: Goal: Will not experience complications related to bowel motility Outcome: Progressing Goal: Will not experience complications related to urinary retention Outcome: Progressing   Problem: Pain Managment: Goal: General experience of comfort will improve and/or be controlled Outcome: Progressing   Problem: Safety: Goal: Ability to remain free from injury will improve Outcome: Progressing   Problem: Skin Integrity: Goal: Risk for impaired skin integrity will decrease Outcome: Progressing   Problem: Education: Goal: Ability to describe self-care measures that may prevent or decrease complications (Diabetes Survival Skills Education) will improve Outcome: Progressing   Problem: Coping: Goal: Ability to adjust to condition or change in health will improve Outcome: Progressing   Problem: Fluid Volume: Goal: Ability to maintain a balanced intake and output will  improve Outcome: Progressing   Problem: Health Behavior/Discharge Planning: Goal: Ability to identify and utilize available resources and services will improve Outcome: Progressing Goal: Ability to manage health-related needs will improve Outcome: Progressing   Problem: Metabolic: Goal: Ability to maintain appropriate glucose levels will improve Outcome: Progressing   Problem: Nutritional: Goal: Maintenance of adequate nutrition will improve Outcome: Progressing Goal: Progress toward achieving an optimal weight will improve Outcome: Progressing   Problem: Skin Integrity: Goal: Risk for impaired skin integrity will decrease Outcome: Progressing   Problem: Tissue Perfusion: Goal: Adequacy of tissue perfusion will improve Outcome: Progressing   Problem: Education: Goal: Ability to describe self-care measures that may prevent or decrease complications (Diabetes Survival Skills Education) will improve Outcome: Progressing   Problem: Cardiac: Goal: Ability to maintain an adequate cardiac output will improve Outcome: Progressing   Problem: Health Behavior/Discharge Planning: Goal: Ability to identify and utilize available resources and services will improve Outcome: Progressing Goal: Ability to manage health-related needs will improve Outcome: Progressing   Problem: Fluid Volume: Goal: Ability to achieve a balanced intake and output will improve Outcome: Progressing   Problem: Metabolic: Goal: Ability to maintain appropriate glucose levels will improve Outcome: Progressing   Problem: Nutritional: Goal: Maintenance of adequate nutrition will improve Outcome: Progressing Goal: Maintenance of adequate weight for body size and type will improve Outcome: Progressing   Problem: Respiratory: Goal: Will regain and/or maintain adequate ventilation Outcome: Progressing   Problem: Urinary Elimination: Goal: Ability to achieve and maintain adequate renal perfusion and  functioning will improve Outcome: Progressing

## 2024-01-12 DIAGNOSIS — E871 Hypo-osmolality and hyponatremia: Secondary | ICD-10-CM | POA: Diagnosis not present

## 2024-01-12 LAB — GLUCOSE, CAPILLARY
Glucose-Capillary: 165 mg/dL — ABNORMAL HIGH (ref 70–99)
Glucose-Capillary: 133 mg/dL — ABNORMAL HIGH (ref 70–99)

## 2024-01-12 LAB — BASIC METABOLIC PANEL
Anion gap: 9 (ref 5–15)
BUN: 17 mg/dL (ref 8–23)
CO2: 26 mmol/L (ref 22–32)
Calcium: 9 mg/dL (ref 8.9–10.3)
Chloride: 96 mmol/L — ABNORMAL LOW (ref 98–111)
Creatinine, Ser: 0.71 mg/dL (ref 0.44–1.00)
GFR, Estimated: >60 mL/min (ref >60)
Glucose, Bld: 107 mg/dL — ABNORMAL HIGH (ref 70–99)
Potassium: 4.2 mmol/L (ref 3.5–5.1)
Sodium: 131 mmol/L — ABNORMAL LOW (ref 135–145)

## 2024-01-12 MED ORDER — FLUCONAZOLE 50 MG PO TABS
150.0000 mg | ORAL_TABLET | Freq: Once | ORAL | Status: AC
  Administered 2024-01-12: 150 mg via ORAL
  Filled 2024-01-12: qty 1

## 2024-01-12 MED ORDER — FLUCONAZOLE 100 MG PO TABS: 100.0000 mg | ORAL_TABLET | Freq: Every day | ORAL | 0 refills | Status: AC

## 2024-01-12 NOTE — Discharge Summary (Signed)
Physician Discharge Summary  Jill Crosby ZOX:[id number] DOB: 08-Feb-1961 DOA: 01/03/2024  PCP: Dale [place], MD  Admit date: 01/03/2024 Discharge date: 01/12/2024  Admitted From: Home Disposition:  Home w home health  Recommendations for Outpatient Follow-up:  Follow up with PCP in 1-2 weeks   Home Health:Yes PT OT  Equipment/Devices:Yes oxygen 2L via [place]   Discharge Condition:Stable  CODE STATUS:FULL  Diet recommendation: Carb  Brief/Interim Summary:   63 year old female with a history of type 1 diabetes since age 4, previously on insulin pump which was switched to subcutaneous injections years ago.  She had an URTI recently with respiratory symptoms and worsening encephalopathy prompting presentation to the hospital.   History was obtained from family members who provided collateral information given intubation and mechanical ventilation. Additional background medical history includes essential hypertension, dyslipidemia, anxiety disorder, history of smoking, arthritis, morbid obesity and glaucoma. She presents with N/V and altered mental status after coming down with a cold over a week ago. She tested positive for influenza A in the ED. BMP showed hyperglycemia at 300 and severe hyponatremia and undetectably low levels less than 102 with hypochloremia.  She was encephalopathic and agitated and was intubated in the ED. PCCM was consulted for admission for management of encephalopathy, respiratory failure, and hyponatremia.   Extubated successfully on 2/4.  Remained stable on nasal cannula.  Sodium improving mental status improving.      Discharge Diagnoses:  Principal Problem:   Hyponatremia   Severe hyponatremia Initially presented with sodium less than 102.  Sodium corrected by 60 mEq a day.  Most recently 131 at the time of discharge Plan: Discharged home.  Advised continued fluid restrict to 800 cc at.  Advised to hold hydrochlorothiazide at time of discharge until  further discussion with outpatient providers to discuss antihypertensive strategy.   Influenza pneumonia Extubated successfully On nasal cannula Will dc on home oxygen, 2 L via nasal cannula   Acute metabolic encephalopathy Likely secondary to infection and hyponatremia Mental status baseline   Essential hypertension PTA Norvasc Hold home hydrochlorothiazide at discharge   Insulin-dependent diabetes mellitus Resume home regimen   Class III obesity BMI 42.5.  Complicating factor in overall care and prognosis.  Discharge Instructions  Discharge Instructions     Diet - low sodium heart healthy   Complete by: As directed    Increase activity slowly   Complete by: As directed       Allergies as of 01/12/2024       Reactions   Codeine Nausea And Vomiting        Medication List     PAUSE taking these medications    hydrochlorothiazide 25 MG tablet Wait to take this until your doctor or other care provider tells you to start again. Commonly known as: HYDRODIURIL TAKE 1 TABLET BY MOUTH DAILY       TAKE these medications    amLODipine 10 MG tablet Commonly known as: NORVASC TAKE 1 TABLET BY MOUTH DAILY   B-12 PO Take by mouth.   Bayer Microlet Lancets lancets USE AS DIRECTED TO CHECK  SUGARS 1 TO 2 TIMES A DAY   cholecalciferol 25 MCG (1000 UNIT) tablet Commonly known as: VITAMIN D3 Take 1,000 Units by mouth daily.   Contour Next Test test strip Generic drug: glucose blood USE TO CHECK BLOOD GLUCOSE 6 TO  7 TIMES DAILY   fluconazole 100 MG tablet Commonly known as: DIFLUCAN Take 1 tablet (100 mg total) by mouth daily for 5 doses.  insulin lispro 100 UNIT/ML KwikPen Commonly known as: HumaLOG KwikPen INJECT SUBCUTANEOUSLY 65  UNITS DAILY.  (Takes distributed over three meals counting carbs)   Insulin Pen Needle 32G X 4 MM Misc Use as directed with insulin 4 x daily.   Lantus SoloStar 100 UNIT/ML Solostar Pen Generic drug: insulin  glargine INJECT SUBCUTANEOUSLY 80 UNITS  AT NIGHT   levocetirizine 5 MG tablet Commonly known as: XYZAL Take 1 tablet (5 mg total) by mouth every evening.   lisinopril 40 MG tablet Commonly known as: ZESTRIL Take 1 tablet (40 mg total) by mouth daily.   meloxicam 7.5 MG tablet Commonly known as: MOBIC Take 7.5 mg by mouth daily.   Repatha SureClick 140 MG/ML Soaj Generic drug: Evolocumab INJECT 1 PEN SUBCUTANEOUSLY  EVERY 2 WEEKS   sertraline 50 MG tablet Commonly known as: ZOLOFT TAKE 1 TABLET BY MOUTH DAILY               Durable Medical Equipment  (From admission, onward)           Start     Ordered   01/11/24 1100  For home use only DME oxygen  Once       Question Answer Comment  Length of Need 6 Months   Mode or (Route) Nasal cannula   Liters per Minute 2   Frequency Continuous (stationary and portable oxygen unit needed)   Oxygen conserving device Yes   Oxygen delivery system Gas      01/11/24 1059   01/11/24 1059  For home use only DME Walker rolling  Once       Question Answer Comment  Walker: With 5 Inch Wheels   Patient needs a walker to treat with the following condition Weakness      01/11/24 1059   01/11/24 1059  For home use only DME Bedside commode  Once       Question:  Patient needs a bedside commode to treat with the following condition  Answer:  Weakness   01/11/24 1059            Follow-up Information     Dale Sweetwater, MD. Go on 01/15/2024.   Specialty: Internal Medicine Why: Appt @ 11:00 am Contact information: [address] [place] [address] [phone]                Allergies  Allergen Reactions   Codeine Nausea And Vomiting    Consultations: PCCM Nephrology   Procedures/Studies: ECHOCARDIOGRAM COMPLETE Result Date: 01/05/2024    ECHOCARDIOGRAM REPORT   Patient Name:   Jill Crosby Date of Exam: 01/05/2024 Medical Rec #:  [id number]        Height:       64.0 in Accession  #:    [id number]       Weight:       264.1 lb Date of Birth:  1961-01-04        BSA:          2.202 m Patient Age:    62 years         BP:           98/53 mmHg Patient Gender: F                HR:           51 bpm. Exam Location:  [facility] Procedure: 2D Echo, Cardiac Doppler and Color Doppler Indications:     Dyspnea  History:  Patient has no prior history of Echocardiogram examinations.                  Signs/Symptoms:Dyspnea and Dizziness/Lightheadedness; Risk                  Factors:Hypertension, Diabetes and Current Smoker.  Sonographer:     Mikki Harbor Referring Phys:  [id number] KHABIB DGAYLI Diagnosing Phys: Yvonne Kendall MD  Sonographer Comments: Technically difficult study due to poor echo windows, echo performed with patient supine and on artificial respirator and patient is obese. IMPRESSIONS  1. Left ventricular ejection fraction, by estimation, is 55 to 60%. The left ventricle has normal function. Left ventricular endocardial border not optimally defined to evaluate regional wall motion. The left ventricular internal cavity size was mildly dilated. There is mild left ventricular hypertrophy. Left ventricular diastolic parameters are consistent with Grade II diastolic dysfunction (pseudonormalization).  2. Right ventricular systolic function is normal. The right ventricular size is normal.  3. Left atrial size was mildly dilated.  4. The mitral valve is grossly normal. Mild mitral valve regurgitation. No evidence of mitral stenosis.  5. The aortic valve is tricuspid. Aortic valve regurgitation is not visualized. No aortic stenosis is present. FINDINGS  Left Ventricle: Left ventricular ejection fraction, by estimation, is 55 to 60%. The left ventricle has normal function. Left ventricular endocardial border not optimally defined to evaluate regional wall motion. The left ventricular internal cavity size was mildly dilated. There is mild left ventricular hypertrophy. Left ventricular diastolic  parameters are consistent with Grade II diastolic dysfunction (pseudonormalization). Right Ventricle: The right ventricular size is normal. No increase in right ventricular wall thickness. Right ventricular systolic function is normal. Left Atrium: Left atrial size was mildly dilated. Right Atrium: Right atrial size was normal in size. Pericardium: There is no evidence of pericardial effusion. Mitral Valve: The mitral valve is grossly normal. Mild mitral valve regurgitation. No evidence of mitral valve stenosis. MV peak gradient, 5.2 mmHg. The mean mitral valve gradient is 2.0 mmHg. Tricuspid Valve: The tricuspid valve is not well visualized. Tricuspid valve regurgitation is not demonstrated. Aortic Valve: The aortic valve is tricuspid. There is mild aortic valve annular calcification. Aortic valve regurgitation is not visualized. No aortic stenosis is present. Aortic valve mean gradient measures 8.0 mmHg. Aortic valve peak gradient measures 15.5 mmHg. Aortic valve area, by VTI measures 1.66 cm. Pulmonic Valve: The pulmonic valve was not well visualized. Pulmonic valve regurgitation is not visualized. No evidence of pulmonic stenosis. Aorta: The aortic root is normal in size and structure. Venous: IVC assessment for right atrial pressure unable to be performed due to mechanical ventilation. IAS/Shunts: The interatrial septum was not well visualized.  LEFT VENTRICLE PLAX 2D LVIDd:         5.30 cm     Diastology LVIDs:         3.00 cm     LV e' medial:    7.51 cm/s LV PW:         1.10 cm     LV E/e' medial:  15.6 LV IVS:        1.00 cm     LV e' lateral:   7.83 cm/s LVOT diam:     2.00 cm     LV E/e' lateral: 14.9 LV SV:         82 LV SV Index:   37 LVOT Area:     3.14 cm  LV Volumes (MOD) LV vol d, MOD A2C: 52.9 ml  LV vol d, MOD A4C: 54.9 ml LV vol s, MOD A2C: 19.3 ml LV vol s, MOD A4C: 24.6 ml LV SV MOD A2C:     33.6 ml LV SV MOD A4C:     54.9 ml LV SV MOD BP:      33.2 ml RIGHT VENTRICLE RV Basal diam:  2.90 cm  RV Mid diam:    2.80 cm RV S prime:     9.03 cm/s TAPSE (M-mode): 2.4 cm LEFT ATRIUM             Index        RIGHT ATRIUM           Index LA diam:        4.60 cm 2.09 cm/m   RA Area:     15.30 cm LA Vol (A2C):   76.3 ml 34.65 ml/m  RA Volume:   39.00 ml  17.71 ml/m LA Vol (A4C):   58.9 ml 26.75 ml/m LA Biplane Vol: 71.6 ml 32.52 ml/m  AORTIC VALVE                     PULMONIC VALVE AV Area (Vmax):    1.66 cm      PV Vmax:       0.98 m/s AV Area (Vmean):   1.67 cm      PV Peak grad:  3.8 mmHg AV Area (VTI):     1.66 cm AV Vmax:           197.00 cm/s AV Vmean:          131.000 cm/s AV VTI:            0.491 m AV Peak Grad:      15.5 mmHg AV Mean Grad:      8.0 mmHg LVOT Vmax:         104.00 cm/s LVOT Vmean:        69.600 cm/s LVOT VTI:          0.260 m LVOT/AV VTI ratio: 0.53  AORTA Ao Root diam: 3.10 cm MITRAL VALVE MV Area (PHT): 3.02 cm     SHUNTS MV Area VTI:   1.68 cm     Systemic VTI:  0.26 m MV Peak grad:  5.2 mmHg     Systemic Diam: 2.00 cm MV Mean grad:  2.0 mmHg MV Vmax:       1.14 m/s MV Vmean:      57.9 cm/s MV Decel Time: 251 msec MV E velocity: 117.00 cm/s MV A velocity: 87.40 cm/s MV E/A ratio:  1.34 Yvonne Kendall MD Electronically signed by Yvonne Kendall MD Signature Date/Time: 01/05/2024/10:09:06 AM    Final    DG Chest Port 1 View Addendum Date: 01/04/2024 ADDENDUM REPORT: 01/04/2024 00:34 ADDENDUM: Addendum to include position of the central line. Right IJ CVC tip in the low SVC.  No pneumothorax. Electronically Signed   By: Minerva Fester M.D.   On: 01/04/2024 00:34   Result Date: 01/04/2024 CLINICAL DATA:  Central line placement EXAM: PORTABLE CHEST 1 VIEW COMPARISON:  Radiograph 01/03/2024 at 6:40 p.m. FINDINGS: Stable cardiomegaly. Patchy bilateral airspace and interstitial opacities are similar. Subdiaphragmatic enteric tube. The endotracheal tube tip is approximately 2.3 cm from the carina. IMPRESSION: 1. Endotracheal tube tip is approximately 2.3 cm from the carina. 2.  Similar cardiomegaly and bilateral airspace opacities. Electronically Signed: By: Minerva Fester M.D. On: 01/04/2024 00:16   DG Abd 1 View Result Date: 01/04/2024 CLINICAL  DATA:  Abdominal distension EXAM: ABDOMEN - 1 VIEW COMPARISON:  Radiograph 01/03/2024 at 6:43 p.m. FINDINGS: Enteric tube tip and side-port in the stomach. IMPRESSION: Enteric tube tip and side-port in the stomach. Electronically Signed   By: Minerva Fester M.D.   On: 01/04/2024 00:15   CT HEAD WO CONTRAST ( ) Result Date: 01/03/2024 CLINICAL DATA:  Altered mental status EXAM: CT HEAD WITHOUT CONTRAST TECHNIQUE: Contiguous axial images were obtained from the base of the skull through the vertex without intravenous contrast. RADIATION DOSE REDUCTION: This exam was performed according to the departmental dose-optimization program which includes automated exposure control, adjustment of the mA and/or kV according to patient size and/or use of iterative reconstruction technique. COMPARISON:  None Available. FINDINGS: Brain: No mass,hemorrhage or extra-axial collection. Normal appearance of the parenchyma and CSF spaces. Vascular: No hyperdense vessel or unexpected vascular calcification. Skull: The visualized skull base, calvarium and extracranial soft tissues are normal. Sinuses/Orbits: Left mastoid effusion. Normal orbits. Small right maxillary sinus retention cyst. Other: None. IMPRESSION: 1. No acute intracranial abnormality. 2. Left mastoid effusion, possibly due to intubation. Electronically Signed   By: Deatra Robinson M.D.   On: 01/03/2024 20:24   DG Abdomen 1 View Result Date: 01/03/2024 CLINICAL DATA:  Orogastric tube placement. EXAM: ABDOMEN - 1 VIEW COMPARISON:  None Available. FINDINGS: Distal tip of nasogastric tube is seen in proximal stomach. IMPRESSION: Distal tip of nasogastric tube seen in proximal stomach. Electronically Signed   By: Lupita Raider M.D.   On: 01/03/2024 18:59   DG Chest Portable 1 View Result Date:  01/03/2024 CLINICAL DATA:  Endotracheal tube placement. EXAM: PORTABLE CHEST 1 VIEW COMPARISON:  Same day. FINDINGS: Endotracheal tube tip is 2 cm above the carina in grossly good position. Nasogastric tube is seen entering stomach. Stable cardiomegaly and bilateral lung opacities. IMPRESSION: Endotracheal tube in grossly good position. Electronically Signed   By: Lupita Raider M.D.   On: 01/03/2024 18:59   DG Chest Portable 1 View Result Date: 01/03/2024 CLINICAL DATA:  Shortness of breath EXAM: PORTABLE CHEST 1 VIEW COMPARISON:  None Available. FINDINGS: The heart size and mediastinal contours are within normal limits. Extensive patchy airspace opacities throughout both lungs. No pleural effusion or pneumothorax. The visualized skeletal structures are unremarkable. IMPRESSION: Extensive patchy airspace opacities throughout both lungs, which could represent multifocal pneumonia or edema. Electronically Signed   By: Duanne Guess D.O.   On: 01/03/2024 15:21      Subjective: Seen and examined on the day of discharge.  Stable no distress.  Okay for discharge home.  Discharge Exam: Vitals:   01/11/24 2356 01/12/24 0900  BP: (!) 157/55 (!) 142/57  Pulse: 98 90  Resp: 16 16  Temp: 98.3 F (36.8 C) 98 F (36.7 C)  SpO2: 94% 96%   Vitals:   01/11/24 1542 01/11/24 1547 01/11/24 2356 01/12/24 0900  BP: 139/62 (!) 142/68 (!) 157/55 (!) 142/57  Pulse: (!) 111 (!) 104 98 90  Resp: 16 18 16 16   Temp: 98.4 F (36.9 C) 98.5 F (36.9 C) 98.3 F (36.8 C) 98 F (36.7 C)  TempSrc:      SpO2: 100% 100% 94% 96%  Weight:      Height:        General: Pt is alert, awake, not in acute distress Cardiovascular: RRR, S1/S2 +, no rubs, no gallops Respiratory: CTA bilaterally, no wheezing, no rhonchi Abdominal: Soft, NT, ND, bowel sounds + Extremities: no edema, no cyanosis  The results of significant diagnostics from this hospitalization (including imaging, microbiology, ancillary and  laboratory) are listed below for reference.     Microbiology: Recent Results (from the past 240 hours)  Resp panel by RT-PCR (RSV, Flu A&B, Covid) Anterior Nasal Swab     Status: Abnormal   Collection Time: 01/03/24  2:52 PM   Specimen: Anterior Nasal Swab  Result Value Ref Range Status   SARS Coronavirus 2 by RT PCR NEGATIVE NEGATIVE Final    Comment: (NOTE) SARS-CoV-2 target nucleic acids are NOT DETECTED.  The SARS-CoV-2 RNA is generally detectable in upper respiratory specimens during the acute phase of infection. The lowest concentration of SARS-CoV-2 viral copies this assay can detect is 138 copies/mL. A negative result does not preclude SARS-Cov-2 infection and should not be used as the sole basis for treatment or other patient management decisions. A negative result may occur with  improper specimen collection/handling, submission of specimen other than nasopharyngeal swab, presence of viral mutation(s) within the areas targeted by this assay, and inadequate number of viral copies(<138 copies/mL). A negative result must be combined with clinical observations, patient history, and epidemiological information. The expected result is Negative.  Fact Sheet for Patients:  BloggerCourse.com  Fact Sheet for Healthcare Providers:  SeriousBroker.it  This test is no t yet approved or cleared by the [place] FDA and  has been authorized for detection and/or diagnosis of SARS-CoV-2 by FDA under an Emergency Use Authorization (EUA). This EUA will remain  in effect (meaning this test can be used) for the duration of the COVID-19 declaration under Section 564(b)(1) of the Act, 21 U.S.C.section 360bbb-3(b)(1), unless the authorization is terminated  or revoked sooner.       Influenza A by PCR POSITIVE (A) NEGATIVE Final   Influenza B by PCR NEGATIVE NEGATIVE Final    Comment: (NOTE) The Xpert Xpress SARS-CoV-2/FLU/RSV plus  assay is intended as an aid in the diagnosis of influenza from Nasopharyngeal swab specimens and should not be used as a sole basis for treatment. Nasal washings and aspirates are unacceptable for Xpert Xpress SARS-CoV-2/FLU/RSV testing.  Fact Sheet for Patients: BloggerCourse.com  Fact Sheet for Healthcare Providers: SeriousBroker.it  This test is not yet approved or cleared by the [place] FDA and has been authorized for detection and/or diagnosis of SARS-CoV-2 by FDA under an Emergency Use Authorization (EUA). This EUA will remain in effect (meaning this test can be used) for the duration of the COVID-19 declaration under Section 564(b)(1) of the Act, 21 U.S.C. section 360bbb-3(b)(1), unless the authorization is terminated or revoked.     Resp Syncytial Virus by PCR NEGATIVE NEGATIVE Final    Comment: (NOTE) Fact Sheet for Patients: BloggerCourse.com  Fact Sheet for Healthcare Providers: SeriousBroker.it  This test is not yet approved or cleared by the [place] FDA and has been authorized for detection and/or diagnosis of SARS-CoV-2 by FDA under an Emergency Use Authorization (EUA). This EUA will remain in effect (meaning this test can be used) for the duration of the COVID-19 declaration under Section 564(b)(1) of the Act, 21 U.S.C. section 360bbb-3(b)(1), unless the authorization is terminated or revoked.  Performed at [facility], [address] [place], [place] [address]   Blood culture (routine x 2)     Status: None   Collection Time: 01/03/24  5:43 PM   Specimen: BLOOD LEFT HAND  Result Value Ref Range Status   Specimen Description BLOOD LEFT HAND  Final   Special Requests   Final  BOTTLES DRAWN AEROBIC AND ANAEROBIC Blood Culture results may not be optimal due to an inadequate volume of blood received in culture bottles   Culture   Final     NO GROWTH 5 DAYS Performed at [facility], [address] [place], [place] [address]    Report Status 01/08/2024 FINAL  Final  Blood culture (routine x 2)     Status: None   Collection Time: 01/03/24  6:40 PM   Specimen: BLOOD RIGHT HAND  Result Value Ref Range Status   Specimen Description BLOOD RIGHT HAND  Final   Special Requests   Final    BOTTLES DRAWN AEROBIC AND ANAEROBIC Blood Culture results may not be optimal due to an inadequate volume of blood received in culture bottles   Culture   Final    NO GROWTH 5 DAYS Performed at [facility], [address] [place], [place] [address]    Report Status 01/08/2024 FINAL  Final  Culture, blood (Routine X 2) w Reflex to ID Panel     Status: None   Collection Time: 01/03/24  8:36 PM   Specimen: BLOOD  Result Value Ref Range Status   Specimen Description BLOOD RIGHT HAND  Final   Special Requests   Final    BOTTLES DRAWN AEROBIC ONLY Blood Culture results may not be optimal due to an inadequate volume of blood received in culture bottles   Culture   Final    NO GROWTH 5 DAYS Performed at [facility], [address] [place], [place] [address]    Report Status 01/08/2024 FINAL  Final  Culture, blood (Routine X 2) w Reflex to ID Panel     Status: None   Collection Time: 01/03/24  8:40 PM   Specimen: BLOOD  Result Value Ref Range Status   Specimen Description BLOOD BLOOD LEFT HAND  Final   Special Requests   Final    BOTTLES DRAWN AEROBIC AND ANAEROBIC Blood Culture results may not be optimal due to an inadequate volume of blood received in culture bottles   Culture   Final    NO GROWTH 5 DAYS Performed at [facility], [address] [place], [place] [address]    Report Status 01/08/2024 FINAL  Final  MRSA Next Gen by PCR, Nasal     Status: None   Collection Time: 01/03/24  8:45 PM   Specimen: Nasal Mucosa; Nasal Swab  Result Value Ref Range Status   MRSA by PCR Next Gen NOT  DETECTED NOT DETECTED Final    Comment: (NOTE) The GeneXpert MRSA Assay (FDA approved for NASAL specimens only), is one component of a comprehensive MRSA colonization surveillance program. It is not intended to diagnose MRSA infection nor to guide or monitor treatment for MRSA infections. Test performance is not FDA approved in patients less than 66 years old. Performed at [facility], [address] [place], [place] [address]   Respiratory (~20 pathogens) panel by PCR     Status: Abnormal   Collection Time: 01/04/24  5:28 PM   Specimen: Nasopharyngeal Swab; Respiratory  Result Value Ref Range Status   Adenovirus NOT DETECTED NOT DETECTED Final   Coronavirus 229E NOT DETECTED NOT DETECTED Final    Comment: (NOTE) The Coronavirus on the Respiratory Panel, DOES NOT test for the novel  Coronavirus (2019 nCoV)    Coronavirus HKU1 NOT DETECTED NOT DETECTED Final   Coronavirus NL63 NOT DETECTED NOT DETECTED Final   Coronavirus OC43 NOT DETECTED NOT  DETECTED Final   Metapneumovirus NOT DETECTED NOT DETECTED Final   Rhinovirus / Enterovirus NOT DETECTED NOT DETECTED Final   Influenza A H1 2009 DETECTED (A) NOT DETECTED Final   Influenza B NOT DETECTED NOT DETECTED Final   Parainfluenza Virus 1 NOT DETECTED NOT DETECTED Final   Parainfluenza Virus 2 NOT DETECTED NOT DETECTED Final   Parainfluenza Virus 3 NOT DETECTED NOT DETECTED Final   Parainfluenza Virus 4 NOT DETECTED NOT DETECTED Final   Respiratory Syncytial Virus NOT DETECTED NOT DETECTED Final   Bordetella pertussis NOT DETECTED NOT DETECTED Final   Bordetella Parapertussis NOT DETECTED NOT DETECTED Final   Chlamydophila pneumoniae NOT DETECTED NOT DETECTED Final   Mycoplasma pneumoniae NOT DETECTED NOT DETECTED Final    Comment: Performed at [facility], [address], [place], [place] [address]     Labs: BNP (last 3 results) Recent Labs    01/03/24 1633 01/03/24 2036  BNP 288.5* 239.2*   Basic  Metabolic Panel: Recent Labs  Lab 01/06/24 0528 01/06/24 0645 01/07/24 0503 01/07/24 0631 01/08/24 0128 01/08/24 0524 01/09/24 0422 01/09/24 1215 01/09/24 2006 01/10/24 0325 01/11/24 0428 01/12/24 0646  NA 120*   < > 125*   < > 131*   < > 131* 130* 130* 133* 130* 131*  K 3.5  --  3.2*   < > 3.5  --  4.2  --   --  4.6 4.5 4.2  CL 84*  --  86*   < > 90*  --  90*  --   --  92* 94* 96*  CO2 29  --  31   < > 31  --  31  --   --  31 28 26   GLUCOSE 94  --  75   < > 140*  --  125*  --   --  111* 208* 107*  BUN 10  --  10   < > 12  --  13  --   --  17 19 17   CREATININE 0.63  --  0.63   < > 0.72  --  0.71  --   --  0.77 0.74 0.71  CALCIUM 8.3*  --  8.4*   < > 8.7*  --  9.0  --   --  9.1 9.1 9.0  MG 2.2  --  2.2  --  2.2  --  2.2  --   --  2.3  --   --   PHOS 2.3*  --  2.9  --  4.0  --  4.8*  --   --  5.4*  --   --    < > = values in this interval not displayed.   Liver Function Tests: Recent Labs  Lab 01/06/24 0528  AST 28  ALT 33  ALKPHOS 49  BILITOT 0.9  PROT 5.7*  ALBUMIN 2.5*   No results for input(s): "LIPASE", "AMYLASE" in the last 168 hours. No results for input(s): "AMMONIA" in the last 168 hours. CBC: Recent Labs  Lab 01/06/24 0528 01/07/24 0503 01/08/24 0524 01/10/24 0325  WBC 21.2* 15.0* 15.0* 13.8*  HGB 10.4* 10.4* 10.7* 10.0*  HCT 28.7* 30.1* 31.6* 30.2*  MCV 82.9 85.5 86.6 89.3  PLT 367 382 413* 390   Cardiac Enzymes: No results for input(s): "CKTOTAL", "CKMB", "CKMBINDEX", "TROPONINI" in the last 168 hours. BNP: Invalid input(s): "POCBNP" CBG: Recent Labs  Lab 01/11/24 1720 01/11/24 2042 01/11/24 2353 01/12/24 0445 01/12/24 0852  GLUCAP 317* 231* 239*  165* 133*   D-Dimer No results for input(s): "DDIMER" in the last 72 hours. Hgb A1c No results for input(s): "HGBA1C" in the last 72 hours. Lipid Profile No results for input(s): "CHOL", "HDL", "LDLCALC", "TRIG", "CHOLHDL", "LDLDIRECT" in the last 72 hours. Thyroid function studies No  results for input(s): "TSH", "T4TOTAL", "T3FREE", "THYROIDAB" in the last 72 hours.  Invalid input(s): "FREET3" Anemia work up No results for input(s): "VITAMINB12", "FOLATE", "FERRITIN", "TIBC", "IRON", "RETICCTPCT" in the last 72 hours. Urinalysis    Component Value Date/Time   COLORURINE STRAW (A) 01/03/2024 2204   APPEARANCEUR CLEAR (A) 01/03/2024 2204   APPEARANCEUR Clear 04/08/2023 1044   LABSPEC 1.005 01/03/2024 2204   PHURINE 6.0 01/03/2024 2204   GLUCOSEU >=500 (A) 01/03/2024 2204   HGBUR NEGATIVE 01/03/2024 2204   BILIRUBINUR NEGATIVE 01/03/2024 2204   BILIRUBINUR Negative 04/08/2023 1044   KETONESUR NEGATIVE 01/03/2024 2204   PROTEINUR NEGATIVE 01/03/2024 2204   NITRITE NEGATIVE 01/03/2024 2204   LEUKOCYTESUR NEGATIVE 01/03/2024 2204   Sepsis Labs Recent Labs  Lab 01/06/24 0528 01/07/24 0503 01/08/24 0524 01/10/24 0325  WBC 21.2* 15.0* 15.0* 13.8*   Microbiology Recent Results (from the past 240 hours)  Resp panel by RT-PCR (RSV, Flu A&B, Covid) Anterior Nasal Swab     Status: Abnormal   Collection Time: 01/03/24  2:52 PM   Specimen: Anterior Nasal Swab  Result Value Ref Range Status   SARS Coronavirus 2 by RT PCR NEGATIVE NEGATIVE Final    Comment: (NOTE) SARS-CoV-2 target nucleic acids are NOT DETECTED.  The SARS-CoV-2 RNA is generally detectable in upper respiratory specimens during the acute phase of infection. The lowest concentration of SARS-CoV-2 viral copies this assay can detect is 138 copies/mL. A negative result does not preclude SARS-Cov-2 infection and should not be used as the sole basis for treatment or other patient management decisions. A negative result may occur with  improper specimen collection/handling, submission of specimen other than nasopharyngeal swab, presence of viral mutation(s) within the areas targeted by this assay, and inadequate number of viral copies(<138 copies/mL). A negative result must be combined with clinical  observations, patient history, and epidemiological information. The expected result is Negative.  Fact Sheet for Patients:  BloggerCourse.com  Fact Sheet for Healthcare Providers:  SeriousBroker.it  This test is no t yet approved or cleared by the [place] FDA and  has been authorized for detection and/or diagnosis of SARS-CoV-2 by FDA under an Emergency Use Authorization (EUA). This EUA will remain  in effect (meaning this test can be used) for the duration of the COVID-19 declaration under Section 564(b)(1) of the Act, 21 U.S.C.section 360bbb-3(b)(1), unless the authorization is terminated  or revoked sooner.       Influenza A by PCR POSITIVE (A) NEGATIVE Final   Influenza B by PCR NEGATIVE NEGATIVE Final    Comment: (NOTE) The Xpert Xpress SARS-CoV-2/FLU/RSV plus assay is intended as an aid in the diagnosis of influenza from Nasopharyngeal swab specimens and should not be used as a sole basis for treatment. Nasal washings and aspirates are unacceptable for Xpert Xpress SARS-CoV-2/FLU/RSV testing.  Fact Sheet for Patients: BloggerCourse.com  Fact Sheet for Healthcare Providers: SeriousBroker.it  This test is not yet approved or cleared by the [place] FDA and has been authorized for detection and/or diagnosis of SARS-CoV-2 by FDA under an Emergency Use Authorization (EUA). This EUA will remain in effect (meaning this test can be used) for the duration of the COVID-19 declaration under Section 564(b)(1) of the  Act, 21 U.S.C. section 360bbb-3(b)(1), unless the authorization is terminated or revoked.     Resp Syncytial Virus by PCR NEGATIVE NEGATIVE Final    Comment: (NOTE) Fact Sheet for Patients: BloggerCourse.com  Fact Sheet for Healthcare Providers: SeriousBroker.it  This test is not yet approved or cleared  by the [place] FDA and has been authorized for detection and/or diagnosis of SARS-CoV-2 by FDA under an Emergency Use Authorization (EUA). This EUA will remain in effect (meaning this test can be used) for the duration of the COVID-19 declaration under Section 564(b)(1) of the Act, 21 U.S.C. section 360bbb-3(b)(1), unless the authorization is terminated or revoked.  Performed at [facility], [address] [place], [place] [address]   Blood culture (routine x 2)     Status: None   Collection Time: 01/03/24  5:43 PM   Specimen: BLOOD LEFT HAND  Result Value Ref Range Status   Specimen Description BLOOD LEFT HAND  Final   Special Requests   Final    BOTTLES DRAWN AEROBIC AND ANAEROBIC Blood Culture results may not be optimal due to an inadequate volume of blood received in culture bottles   Culture   Final    NO GROWTH 5 DAYS Performed at [facility], [address] [place], [place] [address]    Report Status 01/08/2024 FINAL  Final  Blood culture (routine x 2)     Status: None   Collection Time: 01/03/24  6:40 PM   Specimen: BLOOD RIGHT HAND  Result Value Ref Range Status   Specimen Description BLOOD RIGHT HAND  Final   Special Requests   Final    BOTTLES DRAWN AEROBIC AND ANAEROBIC Blood Culture results may not be optimal due to an inadequate volume of blood received in culture bottles   Culture   Final    NO GROWTH 5 DAYS Performed at [facility], [address] [place], [place] [address]    Report Status 01/08/2024 FINAL  Final  Culture, blood (Routine X 2) w Reflex to ID Panel     Status: None   Collection Time: 01/03/24  8:36 PM   Specimen: BLOOD  Result Value Ref Range Status   Specimen Description BLOOD RIGHT HAND  Final   Special Requests   Final    BOTTLES DRAWN AEROBIC ONLY Blood Culture results may not be optimal due to an inadequate volume of blood received in culture bottles   Culture   Final    NO GROWTH 5  DAYS Performed at [facility], [address] [place], [place] [address]    Report Status 01/08/2024 FINAL  Final  Culture, blood (Routine X 2) w Reflex to ID Panel     Status: None   Collection Time: 01/03/24  8:40 PM   Specimen: BLOOD  Result Value Ref Range Status   Specimen Description BLOOD BLOOD LEFT HAND  Final   Special Requests   Final    BOTTLES DRAWN AEROBIC AND ANAEROBIC Blood Culture results may not be optimal due to an inadequate volume of blood received in culture bottles   Culture   Final    NO GROWTH 5 DAYS Performed at [facility], [address] [place], [place] [address]    Report Status 01/08/2024 FINAL  Final  MRSA Next Gen by PCR, Nasal     Status: None   Collection Time: 01/03/24  8:45 PM   Specimen: Nasal Mucosa; Nasal Swab  Result Value Ref Range Status   MRSA  by PCR Next Gen NOT DETECTED NOT DETECTED Final    Comment: (NOTE) The GeneXpert MRSA Assay (FDA approved for NASAL specimens only), is one component of a comprehensive MRSA colonization surveillance program. It is not intended to diagnose MRSA infection nor to guide or monitor treatment for MRSA infections. Test performance is not FDA approved in patients less than 88 years old. Performed at [facility], [address] [place], [place] [address]   Respiratory (~20 pathogens) panel by PCR     Status: Abnormal   Collection Time: 01/04/24  5:28 PM   Specimen: Nasopharyngeal Swab; Respiratory  Result Value Ref Range Status   Adenovirus NOT DETECTED NOT DETECTED Final   Coronavirus 229E NOT DETECTED NOT DETECTED Final    Comment: (NOTE) The Coronavirus on the Respiratory Panel, DOES NOT test for the novel  Coronavirus (2019 nCoV)    Coronavirus HKU1 NOT DETECTED NOT DETECTED Final   Coronavirus NL63 NOT DETECTED NOT DETECTED Final   Coronavirus OC43 NOT DETECTED NOT DETECTED Final   Metapneumovirus NOT DETECTED NOT DETECTED Final   Rhinovirus / Enterovirus NOT  DETECTED NOT DETECTED Final   Influenza A H1 2009 DETECTED (A) NOT DETECTED Final   Influenza B NOT DETECTED NOT DETECTED Final   Parainfluenza Virus 1 NOT DETECTED NOT DETECTED Final   Parainfluenza Virus 2 NOT DETECTED NOT DETECTED Final   Parainfluenza Virus 3 NOT DETECTED NOT DETECTED Final   Parainfluenza Virus 4 NOT DETECTED NOT DETECTED Final   Respiratory Syncytial Virus NOT DETECTED NOT DETECTED Final   Bordetella pertussis NOT DETECTED NOT DETECTED Final   Bordetella Parapertussis NOT DETECTED NOT DETECTED Final   Chlamydophila pneumoniae NOT DETECTED NOT DETECTED Final   Mycoplasma pneumoniae NOT DETECTED NOT DETECTED Final    Comment: Performed at [facility], [address], [place], [place] [address]     Time coordinating discharge: Over 30 minutes  SIGNED:   Tresa Moore, MD  [facility] 01/12/2024, 11:48 AM Pager   If 7PM-7AM, please contact night-coverage

## 2024-01-12 NOTE — TOC Transition Note (Signed)
Transition of Care [facility]) - Discharge Note   Patient Details  Name: Jill Crosby MRN: [id number] Date of Birth: [birth year]  Transition of Care [facility]) CM/SW Contact:  Garret Reddish, RN Phone Number: 01/12/2024, 4:15 PM   Clinical Narrative:    Chart reviewed.  Noted that patient has orders for discharge today.   I have informed patient that PT has recommended Home Health services on discharge.  Patient did not have a home health preference.  I have asked Velna Hatchet with [facility] to accept home health referral.  Velna Hatchet reports that start of care would be Friday February 14 th 2025.    I have asked Jon with [employer] to provide patient with home oxygen, Rolling walker, and BSC.    I have informed staff nurse of the above information.     Final next level of care: Home w Home Health Services Barriers to Discharge: No Barriers Identified   Patient Goals and CMS Choice   CMS Medicare.gov Compare Post Acute Care list provided to:: Patient Choice offered to / list presented to : Patient      Discharge Placement                    Patient and family notified of of transfer: 01/12/24  Discharge Plan and Services Additional resources added to the After Visit Summary for       Post Acute Care Choice: Skilled Nursing Facility          DME Arranged: 3-N-1, Oxygen, 3-N-1, Oxygen, Walker rolling   Date DME Agency Contacted: 01/12/24   Representative spoke with at DME Agency: [facility] HH Arranged: PT, OT [facility] Agency: [facility] Date [facility] Agency Contacted: 01/11/24   Representative spoke with at [facility] Agency: Velna Hatchet  Social Drivers of Health (SDOH) Interventions SDOH Screenings   Food Insecurity: Patient Unable To Answer (01/03/2024)  Housing: Patient Unable To Answer (01/03/2024)  Transportation Needs: Patient Unable To Answer (01/03/2024)  Utilities: Patient Unable To Answer (01/03/2024)  Depression (PHQ2-9): Low Risk  (08/05/2023)  Social Connections: Patient Unable To Answer  (01/03/2024)  Tobacco Use: Medium Risk (01/03/2024)     Readmission Risk Interventions     No data to display

## 2024-01-12 NOTE — Plan of Care (Signed)
  Problem: Education: Goal: Knowledge of General Education information will improve Description: Including pain rating scale, medication(s)/side effects and non-pharmacologic comfort measures Outcome: Adequate for Discharge   Problem: Health Behavior/Discharge Planning: Goal: Ability to manage health-related needs will improve Outcome: Adequate for Discharge   Problem: Clinical Measurements: Goal: Ability to maintain clinical measurements within normal limits will improve Outcome: Adequate for Discharge Goal: Will remain free from infection Outcome: Adequate for Discharge Goal: Diagnostic test results will improve Outcome: Adequate for Discharge Goal: Respiratory complications will improve Outcome: Adequate for Discharge Goal: Cardiovascular complication will be avoided Outcome: Adequate for Discharge   Problem: Activity: Goal: Risk for activity intolerance will decrease Outcome: Adequate for Discharge   Problem: Nutrition: Goal: Adequate nutrition will be maintained Outcome: Adequate for Discharge   Problem: Coping: Goal: Level of anxiety will decrease Outcome: Adequate for Discharge   Problem: Elimination: Goal: Will not experience complications related to bowel motility Outcome: Adequate for Discharge Goal: Will not experience complications related to urinary retention Outcome: Adequate for Discharge   Problem: Pain Managment: Goal: General experience of comfort will improve and/or be controlled Outcome: Adequate for Discharge   Problem: Safety: Goal: Ability to remain free from injury will improve Outcome: Adequate for Discharge   Problem: Skin Integrity: Goal: Risk for impaired skin integrity will decrease Outcome: Adequate for Discharge   Problem: Education: Goal: Ability to describe self-care measures that may prevent or decrease complications (Diabetes Survival Skills Education) will improve Outcome: Adequate for Discharge   Problem: Coping: Goal:  Ability to adjust to condition or change in health will improve Outcome: Adequate for Discharge   Problem: Fluid Volume: Goal: Ability to maintain a balanced intake and output will improve Outcome: Adequate for Discharge   Problem: Health Behavior/Discharge Planning: Goal: Ability to identify and utilize available resources and services will improve Outcome: Adequate for Discharge Goal: Ability to manage health-related needs will improve Outcome: Adequate for Discharge   Problem: Metabolic: Goal: Ability to maintain appropriate glucose levels will improve Outcome: Adequate for Discharge   Problem: Nutritional: Goal: Maintenance of adequate nutrition will improve Outcome: Adequate for Discharge Goal: Progress toward achieving an optimal weight will improve Outcome: Adequate for Discharge   Problem: Skin Integrity: Goal: Risk for impaired skin integrity will decrease Outcome: Adequate for Discharge   Problem: Tissue Perfusion: Goal: Adequacy of tissue perfusion will improve Outcome: Adequate for Discharge   Problem: Education: Goal: Ability to describe self-care measures that may prevent or decrease complications (Diabetes Survival Skills Education) will improve Outcome: Adequate for Discharge   Problem: Cardiac: Goal: Ability to maintain an adequate cardiac output will improve Outcome: Adequate for Discharge   Problem: Health Behavior/Discharge Planning: Goal: Ability to identify and utilize available resources and services will improve Outcome: Adequate for Discharge Goal: Ability to manage health-related needs will improve Outcome: Adequate for Discharge   Problem: Fluid Volume: Goal: Ability to achieve a balanced intake and output will improve Outcome: Adequate for Discharge   Problem: Metabolic: Goal: Ability to maintain appropriate glucose levels will improve Outcome: Adequate for Discharge   Problem: Nutritional: Goal: Maintenance of adequate nutrition will  improve Outcome: Adequate for Discharge Goal: Maintenance of adequate weight for body size and type will improve Outcome: Adequate for Discharge   Problem: Respiratory: Goal: Will regain and/or maintain adequate ventilation Outcome: Adequate for Discharge   Problem: Urinary Elimination: Goal: Ability to achieve and maintain adequate renal perfusion and functioning will improve Outcome: Adequate for Discharge

## 2024-01-12 NOTE — Progress Notes (Signed)
Mobility Specialist - Progress Note    01/12/24 1036  Mobility  Activity Ambulated with assistance in hallway;Stood at bedside  Level of Assistance Standby assist, set-up cues, supervision of patient - no hands on  Assistive Device Front wheel walker  Distance Ambulated (ft) 70 ft  Range of Motion/Exercises Active  Activity Response Tolerated well  Mobility Referral Yes  Mobility visit 1 Mobility   Pt resting in recliner on RA upon entry. Pt STS and ambulates to hallway around NS SBA with RW and chair follow. Pt took a seated rest break before being wheeled back to room. Pt maintained upright position throughout session without LOB. Pt returned to recliner and left with needs in reach.    Johnathan Hausen Mobility Specialist 01/12/24, 10:48 AM

## 2024-01-13 ENCOUNTER — Telehealth: Payer: Self-pay

## 2024-01-13 NOTE — Telephone Encounter (Signed)
Copied from CRM [id number]. Topic: General - Other >> Jan 13, 2024 10:03 AM [place] J wrote: Reason for CRM: Patient returning a call from  [facility], LPN. Read most recent telephone encounter to patient and let patient know it was regarding transition of care. Patient would like a call back as soon as possible.

## 2024-01-13 NOTE — Transitions of Care (Post Inpatient/ED Visit) (Unsigned)
   01/13/2024  Name: Jill Crosby MRN: [id number] DOB: 20-Dec-1960  Today's TOC FU Call Status: Today's TOC FU Call Status:: Unsuccessful Call (1st Attempt) Unsuccessful Call (1st Attempt) Date: 01/13/24  Attempted to reach the patient regarding the most recent Inpatient/ED visit.  Follow Up Plan: Additional outreach attempts will be made to reach the patient to complete the Transitions of Care (Post Inpatient/ED visit) call.   Signature Karena Addison, LPN [facility] Nurse Health Advisor Direct Dial [phone]

## 2024-01-14 NOTE — Transitions of Care (Post Inpatient/ED Visit) (Signed)
01/14/2024  Name: Jill Crosby MRN: [id number] DOB: March 21, 1961  Today's TOC FU Call Status: Today's TOC FU Call Status:: Successful TOC FU Call Completed Unsuccessful Call (1st Attempt) Date: 01/13/24 [facility] FU Call Complete Date: 01/14/24 Patient's Name and Date of Birth confirmed.  Transition Care Management Follow-up Telephone Call Date of Discharge: 01/12/24 Discharge Facility: [facility]) Type of Discharge: Inpatient Admission Primary Inpatient Discharge Diagnosis:: hypo-osolaty How have you been since you were released from the hospital?: Better Any questions or concerns?: No  Items Reviewed: Did you receive and understand the discharge instructions provided?: Yes Medications obtained,verified, and reconciled?: Yes (Medications Reviewed) Any new allergies since your discharge?: No Dietary orders reviewed?: Yes Do you have support at home?: Yes People in Home: child(ren), adult  Medications Reviewed Today: Medications Reviewed Today     Reviewed by Karena Addison, LPN (Licensed Practical Nurse) on 01/14/24 at 1046  Med List Status: <None>   Medication Order Taking? Sig Documenting Provider Last Dose Status Informant  amLODipine (NORVASC) 10 MG tablet [id number] No TAKE 1 TABLET BY MOUTH DAILY Dale [place], MD Past Week Active Other, Pharmacy Records  BAYER MICROLET LANCETS lancets [id number] No USE AS DIRECTED TO CHECK  SUGARS 1 TO 2 TIMES A Donne Anon, MD Taking Active Other, Pharmacy Records  cholecalciferol (VITAMIN D3) 25 MCG (1000 UT) tablet [id number] No Take 1,000 Units by mouth daily. [provider] Past Week Active Other, Pharmacy Records  CONTOUR NEXT TEST test strip [id number]  USE TO CHECK BLOOD GLUCOSE 6 TO  7 TIMES DAILY Dale [place], MD  Active Other, Pharmacy Records  Cyanocobalamin (B-12 PO) [id number] No Take by mouth. [provider] Past Week Active Other, Pharmacy Records  fluconazole  (DIFLUCAN) 100 MG tablet [id number]  Take 1 tablet (100 mg total) by mouth daily for 5 doses. Tresa Moore, MD  Active   hydrochlorothiazide (HYDRODIURIL) 25 MG tablet [id number] No TAKE 1 TABLET BY MOUTH DAILY Dale [place], MD Past Week Active Other, Pharmacy Records  insulin glargine (LANTUS SOLOSTAR) 100 UNIT/ML Solostar Pen [id number] No INJECT SUBCUTANEOUSLY 80 UNITS  AT Shayne [place], MD Past Week Active Other, Pharmacy Records  insulin lispro (HUMALOG KWIKPEN) 100 UNIT/ML KwikPen [id number] No INJECT SUBCUTANEOUSLY 65  UNITS DAILY.  (Takes distributed over three meals counting carbs) Dale [place], MD Past Week Active Other, Pharmacy Records  Insulin Pen Needle 32G X 4 MM MISC [id number]  Use as directed with insulin 4 x daily. Dale [place], MD  Active Other, Pharmacy Records  levocetirizine (XYZAL) 5 MG tablet [id number] No Take 1 tablet (5 mg total) by mouth every evening. Dale [place], MD Past Week Active Other, Pharmacy Records  lisinopril (ZESTRIL) 40 MG tablet [id number] No Take 1 tablet (40 mg total) by mouth daily. Dale [place], MD Past Week Active Other, Pharmacy Records  meloxicam [facility]) 7.5 MG tablet [id number]  Take 7.5 mg by mouth daily. [provider]  Active Other, Pharmacy Records           Med Note Sharia Reeve   Thu Jan 07, 2024  7:32 AM) prn  REPATHA SURECLICK 140 MG/ML SOAJ [id number] No INJECT 1 PEN SUBCUTANEOUSLY  EVERY 2 WEEKS Dale [place], MD Past Month Active Other, Pharmacy Records  sertraline (ZOLOFT) 50 MG tablet [id number] No TAKE 1 TABLET BY MOUTH DAILY Dale [place], MD Past Week Active Other, Pharmacy Records  Med List Note Warden Fill[place], [place] 09/24/18 1227): [employer] EMPLOYEE  Home Care and Equipment/Supplies: Were Home Health Services Ordered?: NA Any new equipment or medical supplies ordered?: Yes Name of Medical supply agency?: [facility] Were you able to get the equipment/medical supplies?:  No Do you have any questions related to the use of the equipment/supplies?: Yes What questions do you have?: not delivered  Functional Questionnaire: Do you need assistance with bathing/showering or dressing?: No Do you need assistance with meal preparation?: No Do you need assistance with eating?: No Do you have difficulty maintaining continence: No Do you need assistance with getting out of bed/getting out of a chair/moving?: No Do you have difficulty managing or taking your medications?: No  Follow up appointments reviewed: PCP Follow-up appointment confirmed?: Yes Date of PCP follow-up appointment?: 01/15/24 Follow-up Provider: [facility] Follow-up appointment confirmed?: NA Do you need transportation to your follow-up appointment?: No Do you understand care options if your condition(s) worsen?: Yes-patient verbalized understanding    SIGNATURE Karena Addison, LPN [facility] Nurse Health Advisor Direct Dial [phone]

## 2024-01-15 ENCOUNTER — Ambulatory Visit: Payer: Managed Care, Other (non HMO) | Admitting: Internal Medicine

## 2024-01-15 ENCOUNTER — Encounter: Payer: Self-pay | Admitting: Internal Medicine

## 2024-01-15 ENCOUNTER — Telehealth: Payer: Self-pay

## 2024-01-15 VITALS — BP 132/70 | HR 82 | Temp 98.0°F | Resp 16 | Ht 64.0 in | Wt 240.0 lb

## 2024-01-15 DIAGNOSIS — I10 Essential (primary) hypertension: Secondary | ICD-10-CM

## 2024-01-15 DIAGNOSIS — E538 Deficiency of other specified B group vitamins: Secondary | ICD-10-CM

## 2024-01-15 DIAGNOSIS — E871 Hypo-osmolality and hyponatremia: Secondary | ICD-10-CM | POA: Diagnosis not present

## 2024-01-15 DIAGNOSIS — F419 Anxiety disorder, unspecified: Secondary | ICD-10-CM

## 2024-01-15 DIAGNOSIS — E7800 Pure hypercholesterolemia, unspecified: Secondary | ICD-10-CM

## 2024-01-15 DIAGNOSIS — E1065 Type 1 diabetes mellitus with hyperglycemia: Secondary | ICD-10-CM | POA: Diagnosis not present

## 2024-01-15 DIAGNOSIS — R531 Weakness: Secondary | ICD-10-CM

## 2024-01-15 DIAGNOSIS — D649 Anemia, unspecified: Secondary | ICD-10-CM

## 2024-01-15 DIAGNOSIS — N898 Other specified noninflammatory disorders of vagina: Secondary | ICD-10-CM

## 2024-01-15 MED ORDER — NYSTATIN 100000 UNIT/GM EX CREA: 1.0000 | TOPICAL_CREAM | Freq: Two times a day (BID) | CUTANEOUS | 0 refills | Status: DC

## 2024-01-15 NOTE — Telephone Encounter (Signed)
Copied from CRM [id number]. Topic: Clinical - Home Health Verbal Orders >> Jan 15, 2024  2:50 PM Louie Boston wrote: Caller/Agency: Elmer Bales, [facility] Callback Number: [phone] Service Requested: Physical Therapy Frequency: 1 week 1, 2 week 3, 1 week 1 Any new concerns about the patient? No

## 2024-01-15 NOTE — Telephone Encounter (Signed)
LMTCB

## 2024-01-15 NOTE — Progress Notes (Signed)
 Subjective:    Patient ID: Jill Crosby, female    DOB: 1961/10/10, 63 y.o.   MRN: [id number]  Patient here for  Chief Complaint  Patient presents with   Hospitalization Follow-up    HPI Here for hospital follow up. Admitted 01/03/24 - 01/12/24 after presenting with - nausea, vomiting and altered mental status after respiratory infection. Tested positive for influenza A. Severe hyponatremia. She was encephalopathic and agitated and was intubated in ED. Extubated on 01/05/24. Discharged home on 2 L [place]. Initially sodium <102. Sodium corrected by q day. Discharge sodium 131. Discharged with recommendation to fluid restrict to 800ccs. She has been eating. Some decrease int the amount of fluid drinking, but not strict 800ccs. Holding hydrochlorothiazide. Mental status per report back to baseline at discharge. Starts home PT today. Is feeling better. Still weak. Sister accompanies her today. History obtained from both of them. No chest pain. Breathing stable. States sugars are ok. She has adjusted her insulin down due to low blood sugars. Sugars doing better. Reports redness and irritation - vaginal area. No discharge. On diflucan. Discussed using nystatin cream.   Past Medical History:  Diagnosis Date   Anxiety    Depression    Elevated BP    Hypercholesterolemia    Increased BMI    Menopause    Morbid obesity (HCC)    Type I diabetes mellitus (HCC)    Past Surgical History:  Procedure Laterality Date   BREAST BIOPSY Left 05/09/2020   ribbon clip, stereo bx, HEMANGIOMA, BENIGN. - MAMMARY EPITHELIUM IS NOT PRESENT.   CATARACT EXTRACTION W/PHACO Left 11/08/2020   Procedure: CATARACT EXTRACTION PHACO AND INTRAOCULAR LENS PLACEMENT (IOC) LEFT DIABETIC 4.14 00:52.1 ;  Surgeon: Galen Manila, MD;  Location: [facility];  Service: Ophthalmology;  Laterality: Left;   CATARACT EXTRACTION W/PHACO Right 12/04/2020   Procedure: CATARACT EXTRACTION PHACO AND INTRAOCULAR LENS PLACEMENT  (IOC) RIGHT DIABETIC 6.11 00:46.1;  Surgeon: Galen Manila, MD;  Location: [facility];  Service: Ophthalmology;  Laterality: Right;  Diabetic - insulin   CESAREAN SECTION  1997   COLONOSCOPY WITH PROPOFOL N/A 01/18/2021   Procedure: COLONOSCOPY WITH PROPOFOL;  Surgeon: Midge Minium, MD;  Location: [facility];  Service: Endoscopy;  Laterality: N/A;  priority 4   Family History  Problem Relation Age of Onset   Hypertension Mother    Hyperlipidemia Mother    Colon polyps Mother    Cancer Father        lung   Colon cancer Maternal Grandfather    Diabetes Maternal Grandfather    Breast cancer Maternal Grandmother    Colon cancer Paternal Grandfather    Breast cancer Maternal Aunt    Heart disease Neg Hx    Ovarian cancer Neg Hx    Social History   Socioeconomic History   Marital status: Single    Spouse name: Not on file   Number of children: Not on file   Years of education: Not on file   Highest education level: Not on file  Occupational History   Not on file  Tobacco Use   Smoking status: Former    Current packs/day: 0.50    Average packs/day: 0.5 packs/day for 20.0 years (10.0 ttl pk-yrs)    Types: Cigarettes   Smokeless tobacco: Never  Substance and Sexual Activity   Alcohol use: No    Alcohol/week: 0.0 standard drinks of alcohol   Drug use: No   Sexual activity: Not Currently    Birth  control/protection: Post-menopausal  Other Topics Concern   Not on file  Social History Narrative   Not on file   Social Drivers of Health   Financial Resource Strain: Not on file  Food Insecurity: Patient Unable To Answer (01/03/2024)   Hunger Vital Sign    Worried About Running Out of Food in the Last Year: Patient unable to answer    Ran Out of Food in the Last Year: Patient unable to answer  Transportation Needs: Patient Unable To Answer (01/03/2024)   PRAPARE - Transportation    Lack of Transportation (Medical): Patient unable to answer    Lack of  Transportation (Non-Medical): Patient unable to answer  Physical Activity: Not on file  Stress: Not on file  Social Connections: Patient Unable To Answer (01/03/2024)   Social Connection and Isolation Panel [NHANES]    Frequency of Communication with Friends and Family: Patient unable to answer    Frequency of Social Gatherings with Friends and Family: Patient unable to answer    Attends Religious Services: Patient unable to answer    Active Member of Clubs or Organizations: Patient unable to answer    Attends Banker Meetings: Patient unable to answer    Marital Status: Patient unable to answer     Review of Systems  Constitutional:  Negative for unexpected weight change.       Appetite has improved.   HENT:  Negative for congestion and sinus pressure.   Respiratory:  Negative for cough, chest tightness and shortness of breath.   Cardiovascular:  Negative for chest pain and palpitations.  Gastrointestinal:  Negative for abdominal pain, diarrhea, nausea and vomiting.  Genitourinary:  Negative for difficulty urinating and dysuria.  Musculoskeletal:  Negative for joint swelling and myalgias.  Skin:  Negative for color change and rash.       Reports redness - vaginal area.   Neurological:  Negative for dizziness and headaches.  Psychiatric/Behavioral:  Negative for agitation and dysphoric mood.        Objective:     BP 132/70   Pulse 82   Temp 98 F (36.7 C)   Resp 16   Ht 5\' 4"  (1.626 m)   Wt 240 lb (108.9 kg)   SpO2 98%   BMI 41.20 kg/m  Wt Readings from Last 3 Encounters:  01/15/24 240 lb (108.9 kg)  01/10/24 247 lb 12.8 oz (112.4 kg)  08/05/23 247 lb 6.4 oz (112.2 kg)    Physical Exam Vitals reviewed.  Constitutional:      General: She is not in acute distress.    Appearance: Normal appearance.  HENT:     Head: Normocephalic and atraumatic.     Right Ear: External ear normal.     Left Ear: External ear normal.  Eyes:     General: No scleral  icterus.       Right eye: No discharge.        Left eye: No discharge.     Conjunctiva/sclera: Conjunctivae normal.  Neck:     Thyroid: No thyromegaly.  Cardiovascular:     Rate and Rhythm: Normal rate and regular rhythm.  Pulmonary:     Effort: No respiratory distress.     Breath sounds: Normal breath sounds. No wheezing.  Abdominal:     General: Bowel sounds are normal.     Palpations: Abdomen is soft.     Tenderness: There is no abdominal tenderness.  Genitourinary:    Comments: Declined.  Musculoskeletal:  General: No swelling or tenderness.     Cervical back: Neck supple. No tenderness.  Lymphadenopathy:     Cervical: No cervical adenopathy.  Skin:    Findings: No erythema or rash.  Neurological:     Mental Status: She is alert.  Psychiatric:        Mood and Affect: Mood normal.        Behavior: Behavior normal.         Outpatient Encounter Medications as of 01/15/2024  Medication Sig   nystatin cream (MYCOSTATIN) Apply 1 Application topically 2 (two) times daily.   amLODipine (NORVASC) 10 MG tablet TAKE 1 TABLET BY MOUTH DAILY   BAYER MICROLET LANCETS lancets USE AS DIRECTED TO CHECK  SUGARS 1 TO 2 TIMES A DAY   cholecalciferol (VITAMIN D3) 25 MCG (1000 UT) tablet Take 1,000 Units by mouth daily.   CONTOUR NEXT TEST test strip USE TO CHECK BLOOD GLUCOSE 6 TO  7 TIMES DAILY   Cyanocobalamin (B-12 PO) Take by mouth.   fluconazole (DIFLUCAN) 100 MG tablet Take 1 tablet (100 mg total) by mouth daily for 5 doses.   insulin glargine (LANTUS SOLOSTAR) 100 UNIT/ML Solostar Pen INJECT SUBCUTANEOUSLY 80 UNITS  AT NIGHT   insulin lispro (HUMALOG KWIKPEN) 100 UNIT/ML KwikPen INJECT SUBCUTANEOUSLY 65  UNITS DAILY.  (Takes distributed over three meals counting carbs)   Insulin Pen Needle 32G X 4 MM MISC Use as directed with insulin 4 x daily.   levocetirizine (XYZAL) 5 MG tablet Take 1 tablet (5 mg total) by mouth every evening.   lisinopril (ZESTRIL) 40 MG tablet Take 1  tablet (40 mg total) by mouth daily.   meloxicam (MOBIC) 7.5 MG tablet Take 7.5 mg by mouth daily.   REPATHA SURECLICK 140 MG/ML SOAJ INJECT 1 PEN SUBCUTANEOUSLY  EVERY 2 WEEKS   sertraline (ZOLOFT) 50 MG tablet TAKE 1 TABLET BY MOUTH DAILY   [DISCONTINUED] hydrochlorothiazide (HYDRODIURIL) 25 MG tablet TAKE 1 TABLET BY MOUTH DAILY   No facility-administered encounter medications on file as of 01/15/2024.    Reconciled medications with pt.   Lab Results  Component Value Date   WBC 10.7 01/15/2024   HGB 11.1 01/15/2024   HCT 34.1 01/15/2024   PLT 427 01/15/2024   GLUCOSE 123 (H) 01/15/2024   CHOL 101 01/03/2024   TRIG 82 01/03/2024   HDL 47 01/03/2024   LDLDIRECT 32 01/03/2024   LDLCALC 36 01/03/2024   ALT 33 01/06/2024   AST 28 01/06/2024   NA 134 01/15/2024   K 4.7 01/15/2024   CL 97 01/15/2024   CREATININE 0.74 01/15/2024   BUN 12 01/15/2024   CO2 24 01/15/2024   TSH 0.617 01/03/2024   HGBA1C 6.9 (H) 01/04/2024    ECHOCARDIOGRAM COMPLETE Result Date: 01/05/2024    ECHOCARDIOGRAM REPORT   Patient Name:   [facility] Date of Exam: 01/05/2024 Medical Rec #:  [id number]        Height:       64.0 in Accession #:    [id number]       Weight:       264.1 lb Date of Birth:  1961-05-05        BSA:          2.202 m Patient Age:    62 years         BP:           98/53 mmHg Patient Gender: F  HR:           51 bpm. Exam Location:  [facility] Procedure: 2D Echo, Cardiac Doppler and Color Doppler Indications:     Dyspnea  History:         Patient has no prior history of Echocardiogram examinations.                  Signs/Symptoms:Dyspnea and Dizziness/Lightheadedness; Risk                  Factors:Hypertension, Diabetes and Current Smoker.  Sonographer:     Mikki Harbor Referring Phys:  [id number] KHABIB DGAYLI Diagnosing Phys: Yvonne Kendall MD  Sonographer Comments: Technically difficult study due to poor echo windows, echo performed with patient supine and on artificial respirator  and patient is obese. IMPRESSIONS  1. Left ventricular ejection fraction, by estimation, is 55 to 60%. The left ventricle has normal function. Left ventricular endocardial border not optimally defined to evaluate regional wall motion. The left ventricular internal cavity size was mildly dilated. There is mild left ventricular hypertrophy. Left ventricular diastolic parameters are consistent with Grade II diastolic dysfunction (pseudonormalization).  2. Right ventricular systolic function is normal. The right ventricular size is normal.  3. Left atrial size was mildly dilated.  4. The mitral valve is grossly normal. Mild mitral valve regurgitation. No evidence of mitral stenosis.  5. The aortic valve is tricuspid. Aortic valve regurgitation is not visualized. No aortic stenosis is present. FINDINGS  Left Ventricle: Left ventricular ejection fraction, by estimation, is 55 to 60%. The left ventricle has normal function. Left ventricular endocardial border not optimally defined to evaluate regional wall motion. The left ventricular internal cavity size was mildly dilated. There is mild left ventricular hypertrophy. Left ventricular diastolic parameters are consistent with Grade II diastolic dysfunction (pseudonormalization). Right Ventricle: The right ventricular size is normal. No increase in right ventricular wall thickness. Right ventricular systolic function is normal. Left Atrium: Left atrial size was mildly dilated. Right Atrium: Right atrial size was normal in size. Pericardium: There is no evidence of pericardial effusion. Mitral Valve: The mitral valve is grossly normal. Mild mitral valve regurgitation. No evidence of mitral valve stenosis. MV peak gradient, 5.2 mmHg. The mean mitral valve gradient is 2.0 mmHg. Tricuspid Valve: The tricuspid valve is not well visualized. Tricuspid valve regurgitation is not demonstrated. Aortic Valve: The aortic valve is tricuspid. There is mild aortic valve annular  calcification. Aortic valve regurgitation is not visualized. No aortic stenosis is present. Aortic valve mean gradient measures 8.0 mmHg. Aortic valve peak gradient measures 15.5 mmHg. Aortic valve area, by VTI measures 1.66 cm. Pulmonic Valve: The pulmonic valve was not well visualized. Pulmonic valve regurgitation is not visualized. No evidence of pulmonic stenosis. Aorta: The aortic root is normal in size and structure. Venous: IVC assessment for right atrial pressure unable to be performed due to mechanical ventilation. IAS/Shunts: The interatrial septum was not well visualized.  LEFT VENTRICLE PLAX 2D LVIDd:         5.30 cm     Diastology LVIDs:         3.00 cm     LV e' medial:    7.51 cm/s LV PW:         1.10 cm     LV E/e' medial:  15.6 LV IVS:        1.00 cm     LV e' lateral:   7.83 cm/s LVOT diam:     2.00 cm  LV E/e' lateral: 14.9 LV SV:         82 LV SV Index:   37 LVOT Area:     3.14 cm  LV Volumes (MOD) LV vol d, MOD A2C: 52.9 ml LV vol d, MOD A4C: 54.9 ml LV vol s, MOD A2C: 19.3 ml LV vol s, MOD A4C: 24.6 ml LV SV MOD A2C:     33.6 ml LV SV MOD A4C:     54.9 ml LV SV MOD BP:      33.2 ml RIGHT VENTRICLE RV Basal diam:  2.90 cm RV Mid diam:    2.80 cm RV S prime:     9.03 cm/s TAPSE (M-mode): 2.4 cm LEFT ATRIUM             Index        RIGHT ATRIUM           Index LA diam:        4.60 cm 2.09 cm/m   RA Area:     15.30 cm LA Vol (A2C):   76.3 ml 34.65 ml/m  RA Volume:   39.00 ml  17.71 ml/m LA Vol (A4C):   58.9 ml 26.75 ml/m LA Biplane Vol: 71.6 ml 32.52 ml/m  AORTIC VALVE                     PULMONIC VALVE AV Area (Vmax):    1.66 cm      PV Vmax:       0.98 m/s AV Area (Vmean):   1.67 cm      PV Peak grad:  3.8 mmHg AV Area (VTI):     1.66 cm AV Vmax:           197.00 cm/s AV Vmean:          131.000 cm/s AV VTI:            0.491 m AV Peak Grad:      15.5 mmHg AV Mean Grad:      8.0 mmHg LVOT Vmax:         104.00 cm/s LVOT Vmean:        69.600 cm/s LVOT VTI:          0.260 m LVOT/AV VTI  ratio: 0.53  AORTA Ao Root diam: 3.10 cm MITRAL VALVE MV Area (PHT): 3.02 cm     SHUNTS MV Area VTI:   1.68 cm     Systemic VTI:  0.26 m MV Peak grad:  5.2 mmHg     Systemic Diam: 2.00 cm MV Mean grad:  2.0 mmHg MV Vmax:       1.14 m/s MV Vmean:      57.9 cm/s MV Decel Time: 251 msec MV E velocity: 117.00 cm/s MV A velocity: 87.40 cm/s MV E/A ratio:  1.34 Cristal [place] MD Electronically signed by Yvonne Kendall MD Signature Date/Time: 01/05/2024/10:09:06 AM    Final        Assessment & Plan:  Type 1 diabetes mellitus with hyperglycemia (HCC) Assessment & Plan: Low carb diet and exercise. No recorded sugar readings to review. Reports doing better. She has adjusted her dosing of insulin and continues to adjust based on her sugar readings. Follow.  Send in readings.   Orders: -     Basic metabolic panel  Hyponatremia Assessment & Plan: Severe hyponatremia during recent hospitalization. She was encephalopathic and agitated and was intubated in ED. Extubated on 01/05/24. Discharged home on 2 L [place]. Initially  sodium <102. Sodium corrected by q day. Discharge sodium 131. Discharged with recommendation to fluid restrict to 800ccs. She has been eating. Some decrease int the amount of fluid drinking, but not strict 800ccs. Holding hydrochlorothiazide. Recheck sodium today.   Orders: -     Basic metabolic panel  Hypercholesterolemia Assessment & Plan: On repatha.  Off zetia. Low cholesterol diet and exercise.  Follow lipid panel.   Lab Results  Component Value Date   CHOL 101 01/03/2024   HDL 47 01/03/2024   LDLCALC 36 01/03/2024   LDLDIRECT 32 01/03/2024   TRIG 82 01/03/2024   CHOLHDL 2.1 01/03/2024      Essential hypertension Assessment & Plan: On amlodipine and lisinopril.  Spot check pressures.  Follow pressures and metabolic panel.  Continue current medication regimen. Remain off hydrochlorothiazide. If elevation, discussed may need to adjust medication.    B12  deficiency Assessment & Plan: Recheck B12 today.   Orders: -     Vitamin B12  Anemia, unspecified type Assessment & Plan: Hgb noted to be decreased in the hospital. Recheck cbc today. Also check B12, iron studies.   Orders: -     CBC with Differential/Platelet -     Iron, TIBC and Ferritin Panel  Anxiety Assessment & Plan: Continue zoloft. Has good support. Follow.    Vaginal irritation Assessment & Plan: Nystatin cream as directed. Follow.  Notify if persistent.    Weakness Assessment & Plan: Persistent weakness. Starting home PT today. Discussed importance of exercising on days therapy not coming out.    Other orders -     Nystatin; Apply 1 Application topically 2 (two) times daily.  Dispense: 30 g; Refill: 0     Dale Peletier, MD

## 2024-01-16 ENCOUNTER — Encounter: Payer: Self-pay | Admitting: Internal Medicine

## 2024-01-16 DIAGNOSIS — R531 Weakness: Secondary | ICD-10-CM | POA: Insufficient documentation

## 2024-01-16 DIAGNOSIS — N898 Other specified noninflammatory disorders of vagina: Secondary | ICD-10-CM | POA: Insufficient documentation

## 2024-01-16 LAB — CBC WITH DIFFERENTIAL/PLATELET
Basophils Absolute: 0.1 10*3/uL (ref 0.0–0.2)
Basos: 1 %
EOS (ABSOLUTE): 0.2 10*3/uL (ref 0.0–0.4)
Eos: 2 %
Hematocrit: 34.1 % (ref 34.0–46.6)
Hemoglobin: 11.1 g/dL (ref 11.1–15.9)
Immature Grans (Abs): 0 10*3/uL (ref 0.0–0.1)
Immature Granulocytes: 0 %
Lymphocytes Absolute: 2.6 10*3/uL (ref 0.7–3.1)
Lymphs: 24 %
MCH: 29.3 pg (ref 26.6–33.0)
MCHC: 32.6 g/dL (ref 31.5–35.7)
MCV: 90 fL (ref 79–97)
Monocytes Absolute: 0.8 10*3/uL (ref 0.1–0.9)
Monocytes: 7 %
Neutrophils Absolute: 7.1 10*3/uL — ABNORMAL HIGH (ref 1.4–7.0)
Neutrophils: 66 %
Platelets: 427 10*3/uL (ref 150–450)
RBC: 3.79 x10E6/uL (ref 3.77–5.28)
RDW: 13.1 % (ref 11.7–15.4)
WBC: 10.7 10*3/uL (ref 3.4–10.8)

## 2024-01-16 LAB — IRON,TIBC AND FERRITIN PANEL
Ferritin: 174 ng/mL — ABNORMAL HIGH (ref 15–150)
Iron Saturation: 13 % — ABNORMAL LOW (ref 15–55)
Iron: 38 ug/dL (ref 27–139)
Total Iron Binding Capacity: 300 ug/dL (ref 250–450)
UIBC: 262 ug/dL (ref 118–369)

## 2024-01-16 LAB — BASIC METABOLIC PANEL
BUN/Creatinine Ratio: 16 (ref 12–28)
BUN: 12 mg/dL (ref 8–27)
CO2: 24 mmol/L (ref 20–29)
Calcium: 9.7 mg/dL (ref 8.7–10.3)
Chloride: 97 mmol/L (ref 96–106)
Creatinine, Ser: 0.74 mg/dL (ref 0.57–1.00)
Glucose: 123 mg/dL — ABNORMAL HIGH (ref 70–99)
Potassium: 4.7 mmol/L (ref 3.5–5.2)
Sodium: 134 mmol/L (ref 134–144)
eGFR: 90 mL/min/{1.73_m2} (ref >59)

## 2024-01-16 LAB — VITAMIN B12: Vitamin B-12: 1746 pg/mL — ABNORMAL HIGH (ref 232–1245)

## 2024-01-16 NOTE — Assessment & Plan Note (Signed)
 Nystatin cream as directed. Follow.  Notify if persistent.

## 2024-01-16 NOTE — Assessment & Plan Note (Signed)
 Low carb diet and exercise. No recorded sugar readings to review. Reports doing better. She has adjusted her dosing of insulin and continues to adjust based on her sugar readings. Follow.  Send in readings.

## 2024-01-16 NOTE — Assessment & Plan Note (Signed)
 Persistent weakness. Starting home PT today. Discussed importance of exercising on days therapy not coming out.

## 2024-01-16 NOTE — Assessment & Plan Note (Signed)
 Continue zoloft. Has good support. Follow.

## 2024-01-16 NOTE — Assessment & Plan Note (Signed)
Recheck B12 today. 

## 2024-01-16 NOTE — Assessment & Plan Note (Signed)
 Hgb noted to be decreased in the hospital. Recheck cbc today. Also check B12, iron studies.

## 2024-01-16 NOTE — Assessment & Plan Note (Signed)
 On repatha.  Off zetia. Low cholesterol diet and exercise.  Follow lipid panel.   Lab Results  Component Value Date   CHOL 101 01/03/2024   HDL 47 01/03/2024   LDLCALC 36 01/03/2024   LDLDIRECT 32 01/03/2024   TRIG 82 01/03/2024   CHOLHDL 2.1 01/03/2024

## 2024-01-16 NOTE — Assessment & Plan Note (Addendum)
 On amlodipine and lisinopril.  Spot check pressures.  Follow pressures and metabolic panel.  Continue current medication regimen. Remain off hydrochlorothiazide. If elevation, discussed may need to adjust medication.

## 2024-01-16 NOTE — Assessment & Plan Note (Signed)
 Severe hyponatremia during recent hospitalization. She was encephalopathic and agitated and was intubated in ED. Extubated on 01/05/24. Discharged home on 2 L [place]. Initially sodium <102. Sodium corrected by q day. Discharge sodium 131. Discharged with recommendation to fluid restrict to 800ccs. She has been eating. Some decrease int the amount of fluid drinking, but not strict 800ccs. Holding hydrochlorothiazide. Recheck sodium today.

## 2024-01-18 NOTE — Telephone Encounter (Signed)
Noted.  Let me know if I need to do anything more.  

## 2024-01-18 NOTE — Telephone Encounter (Signed)
 Jill Crosby given for PT and patient is aware and agreeable to services.

## 2024-01-18 NOTE — Telephone Encounter (Signed)
 Let me know if I need to do anything more.

## 2024-01-19 ENCOUNTER — Encounter: Payer: Managed Care, Other (non HMO) | Admitting: Internal Medicine

## 2024-01-19 NOTE — Telephone Encounter (Signed)
 Nothing needed at this time.

## 2024-01-21 ENCOUNTER — Other Ambulatory Visit: Payer: Self-pay | Admitting: Internal Medicine

## 2024-01-22 ENCOUNTER — Other Ambulatory Visit: Payer: Self-pay | Admitting: Internal Medicine

## 2024-01-22 NOTE — Telephone Encounter (Signed)
 Hydrochlorothiazide on hold. Did not refill. Is off zetia. On repatha.

## 2024-01-25 NOTE — Telephone Encounter (Signed)
 Please refuse these. Pt is not on either of these medications.

## 2024-02-02 ENCOUNTER — Telehealth: Payer: Self-pay

## 2024-02-02 NOTE — Telephone Encounter (Signed)
 Copied from CRM [id number]. Topic: Clinical - Home Health Verbal Orders >> Feb 02, 2024  3:45 PM Truddie Crumble wrote: Caller/Agency: ashley from [employer] Callback Number: [phone] Service Requested: Occupational Therapy( patient declined services) Frequency: nla Any new concerns about the patient? No

## 2024-02-03 NOTE — Telephone Encounter (Signed)
 FYI patient declined OT

## 2024-02-05 ENCOUNTER — Ambulatory Visit: Payer: Managed Care, Other (non HMO) | Admitting: Internal Medicine

## 2024-02-05 VITALS — BP 130/72 | HR 89 | Temp 98.0°F | Resp 16 | Ht 64.0 in | Wt 241.0 lb

## 2024-02-05 DIAGNOSIS — F419 Anxiety disorder, unspecified: Secondary | ICD-10-CM | POA: Diagnosis not present

## 2024-02-05 DIAGNOSIS — I10 Essential (primary) hypertension: Secondary | ICD-10-CM

## 2024-02-05 DIAGNOSIS — M25562 Pain in left knee: Secondary | ICD-10-CM

## 2024-02-05 DIAGNOSIS — R5383 Other fatigue: Secondary | ICD-10-CM

## 2024-02-05 DIAGNOSIS — D649 Anemia, unspecified: Secondary | ICD-10-CM | POA: Diagnosis not present

## 2024-02-05 DIAGNOSIS — R531 Weakness: Secondary | ICD-10-CM

## 2024-02-05 DIAGNOSIS — M25561 Pain in right knee: Secondary | ICD-10-CM

## 2024-02-05 DIAGNOSIS — E7800 Pure hypercholesterolemia, unspecified: Secondary | ICD-10-CM

## 2024-02-05 DIAGNOSIS — E1065 Type 1 diabetes mellitus with hyperglycemia: Secondary | ICD-10-CM

## 2024-02-05 NOTE — Progress Notes (Signed)
 Subjective:    Patient ID: Jill Crosby, female    DOB: 08-09-1961, 63 y.o.   MRN: [id number]  Patient here for  Chief Complaint  Patient presents with   Medical Management of Chronic Issues    HPI Here for a scheduled follow up - Admitted 01/03/24 - 01/12/24 after presenting with - nausea, vomiting and altered mental status after respiratory infection. Tested positive for influenza A. Severe hyponatremia. She was encephalopathic and agitated and was intubated in ED. Extubated on 01/05/24. Discharged home on 2 L [place]. Discharged with recommendation to fluid restrict. Holding hydrochlorothiazide. Has been working with PT 2x/week. Doing exercises on other days as well. Doing repetitions - sitting/standing. Having some pain in her knees.right > left.  Distance walking has increased. Overall feeling better. Still with some decreased appetite. Is adjusting her insulin. Taking 25 units of lantus before bed. Some elevated sugar in am. Still with what she describes as brain fog. Has been out of work. Discussed recommendation to remain out of work, given trying to increase her stamina, etc. Breathing stable.    Past Medical History:  Diagnosis Date   Anxiety    Depression    Elevated BP    Hypercholesterolemia    Increased BMI    Menopause    Morbid obesity (HCC)    Type I diabetes mellitus (HCC)    Past Surgical History:  Procedure Laterality Date   BREAST BIOPSY Left 05/09/2020   ribbon clip, stereo bx, HEMANGIOMA, BENIGN. - MAMMARY EPITHELIUM IS NOT PRESENT.   CATARACT EXTRACTION W/PHACO Left 11/08/2020   Procedure: CATARACT EXTRACTION PHACO AND INTRAOCULAR LENS PLACEMENT (IOC) LEFT DIABETIC 4.14 00:52.1 ;  Surgeon: Galen Manila, MD;  Location: [facility];  Service: Ophthalmology;  Laterality: Left;   CATARACT EXTRACTION W/PHACO Right 12/04/2020   Procedure: CATARACT EXTRACTION PHACO AND INTRAOCULAR LENS PLACEMENT (IOC) RIGHT DIABETIC 6.11 00:46.1;  Surgeon: Galen Manila,  MD;  Location: [facility];  Service: Ophthalmology;  Laterality: Right;  Diabetic - insulin   CESAREAN SECTION  1997   COLONOSCOPY WITH PROPOFOL N/A 01/18/2021   Procedure: COLONOSCOPY WITH PROPOFOL;  Surgeon: Midge Minium, MD;  Location: [facility];  Service: Endoscopy;  Laterality: N/A;  priority 4   Family History  Problem Relation Age of Onset   Hypertension Mother    Hyperlipidemia Mother    Colon polyps Mother    Cancer Father        lung   Colon cancer Maternal Grandfather    Diabetes Maternal Grandfather    Breast cancer Maternal Grandmother    Colon cancer Paternal Grandfather    Breast cancer Maternal Aunt    Heart disease Neg Hx    Ovarian cancer Neg Hx    Social History   Socioeconomic History   Marital status: Single    Spouse name: Not on file   Number of children: Not on file   Years of education: Not on file   Highest education level: 12th grade  Occupational History   Not on file  Tobacco Use   Smoking status: Former    Current packs/day: 0.50    Average packs/day: 0.5 packs/day for 20.0 years (10.0 ttl pk-yrs)    Types: Cigarettes   Smokeless tobacco: Never  Substance and Sexual Activity   Alcohol use: No    Alcohol/week: 0.0 standard drinks of alcohol   Drug use: No   Sexual activity: Not Currently    Birth control/protection: Post-menopausal  Other Topics Concern  Not on file  Social History Narrative   Not on file   Social Drivers of Health   Financial Resource Strain: Low Risk  (02/01/2024)   Overall Financial Resource Strain (CARDIA)    Difficulty of Paying Living Expenses: Not very hard  Food Insecurity: No Food Insecurity (02/01/2024)   Hunger Vital Sign    Worried About Running Out of Food in the Last Year: Never true    Ran Out of Food in the Last Year: Never true  Transportation Needs: No Transportation Needs (02/01/2024)   PRAPARE - Administrator, Civil Service (Medical): No    Lack of Transportation  (Non-Medical): No  Physical Activity: Unknown (02/01/2024)   Exercise Vital Sign    Days of Exercise per Week: 0 days    Minutes of Exercise per Session: Not on file  Stress: No Stress Concern Present (02/01/2024)   [employer] of Occupational Health - Occupational Stress Questionnaire    Feeling of Stress : Only a little  Social Connections: Moderately Isolated (02/01/2024)   Social Connection and Isolation Panel [NHANES]    Frequency of Communication with Friends and Family: More than three times a week    Frequency of Social Gatherings with Friends and Family: Once a week    Attends Religious Services: 1 to 4 times per year    Active Member of [employer] or Organizations: No    Attends Banker Meetings: Patient unable to answer    Marital Status: Divorced     Review of Systems  Constitutional:  Positive for appetite change and fatigue.  HENT:  Negative for congestion and sinus pressure.   Respiratory:  Negative for cough and chest tightness.        Breathing stable.   Cardiovascular:  Negative for chest pain and palpitations.       No increased swelling.   Gastrointestinal:  Negative for abdominal pain, nausea and vomiting.  Genitourinary:  Negative for difficulty urinating and dysuria.  Musculoskeletal:        Knee pain as outlined. Still with weakness. Stamina - distance walking - gradually improving.   Skin:  Negative for color change and rash.  Neurological:  Negative for dizziness and headaches.  Psychiatric/Behavioral:  Negative for agitation and dysphoric mood.        Objective:     BP 130/72   Pulse 89   Temp 98 F (36.7 C)   Resp 16   Ht 5\' 4"  (1.626 m)   Wt 241 lb (109.3 kg)   SpO2 97%   BMI 41.37 kg/m  Wt Readings from Last 3 Encounters:  02/05/24 241 lb (109.3 kg)  01/15/24 240 lb (108.9 kg)  01/10/24 247 lb 12.8 oz (112.4 kg)    Physical Exam Vitals reviewed.  Constitutional:      General: She is not in acute distress.    Appearance:  Normal appearance.  HENT:     Head: Normocephalic and atraumatic.     Right Ear: External ear normal.     Left Ear: External ear normal.     Mouth/Throat:     Pharynx: No oropharyngeal exudate or posterior oropharyngeal erythema.  Eyes:     General: No scleral icterus.       Right eye: No discharge.        Left eye: No discharge.     Conjunctiva/sclera: Conjunctivae normal.  Neck:     Thyroid: No thyromegaly.  Cardiovascular:     Rate and Rhythm: Normal rate  and regular rhythm.  Pulmonary:     Effort: No respiratory distress.     Breath sounds: Normal breath sounds. No wheezing.  Abdominal:     General: Bowel sounds are normal.     Palpations: Abdomen is soft.     Tenderness: There is no abdominal tenderness.  Musculoskeletal:        General: No swelling or tenderness.     Cervical back: Neck supple. No tenderness.  Lymphadenopathy:     Cervical: No cervical adenopathy.  Skin:    Findings: No erythema or rash.  Neurological:     Mental Status: She is alert.  Psychiatric:        Mood and Affect: Mood normal.        Behavior: Behavior normal.         Outpatient Encounter Medications as of 02/05/2024  Medication Sig   amLODipine (NORVASC) 10 MG tablet TAKE 1 TABLET BY MOUTH DAILY   BAYER MICROLET LANCETS lancets USE AS DIRECTED TO CHECK  SUGARS 1 TO 2 TIMES A DAY   cholecalciferol (VITAMIN D3) 25 MCG (1000 UT) tablet Take 1,000 Units by mouth daily.   CONTOUR NEXT TEST test strip USE TO CHECK BLOOD GLUCOSE 6 TO  7 TIMES DAILY   Cyanocobalamin (B-12 PO) Take by mouth.   insulin glargine (LANTUS SOLOSTAR) 100 UNIT/ML Solostar Pen INJECT SUBCUTANEOUSLY 80 UNITS  AT NIGHT   insulin lispro (HUMALOG KWIKPEN) 100 UNIT/ML KwikPen INJECT SUBCUTANEOUSLY 65  UNITS DAILY.  (Takes distributed over three meals counting carbs)   Insulin Pen Needle 32G X 4 MM MISC Use as directed with insulin 4 x daily.   levocetirizine (XYZAL) 5 MG tablet Take 1 tablet (5 mg total) by mouth every  evening.   lisinopril (ZESTRIL) 40 MG tablet Take 1 tablet (40 mg total) by mouth daily.   meloxicam (MOBIC) 7.5 MG tablet Take 7.5 mg by mouth daily.   nystatin cream (MYCOSTATIN) Apply 1 Application topically 2 (two) times daily.   REPATHA SURECLICK 140 MG/ML SOAJ INJECT 1 PEN SUBCUTANEOUSLY  EVERY 2 WEEKS   sertraline (ZOLOFT) 50 MG tablet TAKE 1 TABLET BY MOUTH DAILY   No facility-administered encounter medications on file as of 02/05/2024.     Lab Results  Component Value Date   WBC 10.6 02/05/2024   HGB 11.9 02/05/2024   HCT 36.8 02/05/2024   PLT 402 02/05/2024   GLUCOSE 197 (H) 02/05/2024   CHOL 101 01/03/2024   TRIG 82 01/03/2024   HDL 47 01/03/2024   LDLDIRECT 32 01/03/2024   LDLCALC 36 01/03/2024   ALT 14 02/05/2024   AST 15 02/05/2024   NA 139 02/05/2024   K 4.3 02/05/2024   CL 100 02/05/2024   CREATININE 0.79 02/05/2024   BUN 13 02/05/2024   CO2 22 02/05/2024   TSH 0.617 01/03/2024   HGBA1C 6.9 (H) 01/04/2024    ECHOCARDIOGRAM COMPLETE Result Date: 01/05/2024    ECHOCARDIOGRAM REPORT   Patient Name:   [facility] Ercole Date of Exam: 01/05/2024 Medical Rec #:  [id number]        Height:       64.0 in Accession #:    [id number]       Weight:       264.1 lb Date of Birth:  1960-12-11        BSA:          2.202 m Patient Age:    62 years         BP:  98/53 mmHg Patient Gender: F                HR:           51 bpm. Exam Location:  [facility] Procedure: 2D Echo, Cardiac Doppler and Color Doppler Indications:     Dyspnea  History:         Patient has no prior history of Echocardiogram examinations.                  Signs/Symptoms:Dyspnea and Dizziness/Lightheadedness; Risk                  Factors:Hypertension, Diabetes and Current Smoker.  Sonographer:     Mikki Harbor Referring Phys:  [id number] KHABIB DGAYLI Diagnosing Phys: Yvonne Kendall MD  Sonographer Comments: Technically difficult study due to poor echo windows, echo performed with patient supine and on artificial  respirator and patient is obese. IMPRESSIONS  1. Left ventricular ejection fraction, by estimation, is 55 to 60%. The left ventricle has normal function. Left ventricular endocardial border not optimally defined to evaluate regional wall motion. The left ventricular internal cavity size was mildly dilated. There is mild left ventricular hypertrophy. Left ventricular diastolic parameters are consistent with Grade II diastolic dysfunction (pseudonormalization).  2. Right ventricular systolic function is normal. The right ventricular size is normal.  3. Left atrial size was mildly dilated.  4. The mitral valve is grossly normal. Mild mitral valve regurgitation. No evidence of mitral stenosis.  5. The aortic valve is tricuspid. Aortic valve regurgitation is not visualized. No aortic stenosis is present. FINDINGS  Left Ventricle: Left ventricular ejection fraction, by estimation, is 55 to 60%. The left ventricle has normal function. Left ventricular endocardial border not optimally defined to evaluate regional wall motion. The left ventricular internal cavity size was mildly dilated. There is mild left ventricular hypertrophy. Left ventricular diastolic parameters are consistent with Grade II diastolic dysfunction (pseudonormalization). Right Ventricle: The right ventricular size is normal. No increase in right ventricular wall thickness. Right ventricular systolic function is normal. Left Atrium: Left atrial size was mildly dilated. Right Atrium: Right atrial size was normal in size. Pericardium: There is no evidence of pericardial effusion. Mitral Valve: The mitral valve is grossly normal. Mild mitral valve regurgitation. No evidence of mitral valve stenosis. MV peak gradient, 5.2 mmHg. The mean mitral valve gradient is 2.0 mmHg. Tricuspid Valve: The tricuspid valve is not well visualized. Tricuspid valve regurgitation is not demonstrated. Aortic Valve: The aortic valve is tricuspid. There is mild aortic valve annular  calcification. Aortic valve regurgitation is not visualized. No aortic stenosis is present. Aortic valve mean gradient measures 8.0 mmHg. Aortic valve peak gradient measures 15.5 mmHg. Aortic valve area, by VTI measures 1.66 cm. Pulmonic Valve: The pulmonic valve was not well visualized. Pulmonic valve regurgitation is not visualized. No evidence of pulmonic stenosis. Aorta: The aortic root is normal in size and structure. Venous: IVC assessment for right atrial pressure unable to be performed due to mechanical ventilation. IAS/Shunts: The interatrial septum was not well visualized.  LEFT VENTRICLE PLAX 2D LVIDd:         5.30 cm     Diastology LVIDs:         3.00 cm     LV e' medial:    7.51 cm/s LV PW:         1.10 cm     LV E/e' medial:  15.6 LV IVS:        1.00 cm  LV e' lateral:   7.83 cm/s LVOT diam:     2.00 cm     LV E/e' lateral: 14.9 LV SV:         82 LV SV Index:   37 LVOT Area:     3.14 cm  LV Volumes (MOD) LV vol d, MOD A2C: 52.9 ml LV vol d, MOD A4C: 54.9 ml LV vol s, MOD A2C: 19.3 ml LV vol s, MOD A4C: 24.6 ml LV SV MOD A2C:     33.6 ml LV SV MOD A4C:     54.9 ml LV SV MOD BP:      33.2 ml RIGHT VENTRICLE RV Basal diam:  2.90 cm RV Mid diam:    2.80 cm RV S prime:     9.03 cm/s TAPSE (M-mode): 2.4 cm LEFT ATRIUM             Index        RIGHT ATRIUM           Index LA diam:        4.60 cm 2.09 cm/m   RA Area:     15.30 cm LA Vol (A2C):   76.3 ml 34.65 ml/m  RA Volume:   39.00 ml  17.71 ml/m LA Vol (A4C):   58.9 ml 26.75 ml/m LA Biplane Vol: 71.6 ml 32.52 ml/m  AORTIC VALVE                     PULMONIC VALVE AV Area (Vmax):    1.66 cm      PV Vmax:       0.98 m/s AV Area (Vmean):   1.67 cm      PV Peak grad:  3.8 mmHg AV Area (VTI):     1.66 cm AV Vmax:           197.00 cm/s AV Vmean:          131.000 cm/s AV VTI:            0.491 m AV Peak Grad:      15.5 mmHg AV Mean Grad:      8.0 mmHg LVOT Vmax:         104.00 cm/s LVOT Vmean:        69.600 cm/s LVOT VTI:          0.260 m LVOT/AV VTI  ratio: 0.53  AORTA Ao Root diam: 3.10 cm MITRAL VALVE MV Area (PHT): 3.02 cm     SHUNTS MV Area VTI:   1.68 cm     Systemic VTI:  0.26 m MV Peak grad:  5.2 mmHg     Systemic Diam: 2.00 cm MV Mean grad:  2.0 mmHg MV Vmax:       1.14 m/s MV Vmean:      57.9 cm/s MV Decel Time: 251 msec MV E velocity: 117.00 cm/s MV A velocity: 87.40 cm/s MV E/A ratio:  1.34 Cristal [place] MD Electronically signed by Yvonne Kendall MD Signature Date/Time: 01/05/2024/10:09:06 AM    Final        Assessment & Plan:  Other fatigue -     CBC with Differential/Platelet -     Basic metabolic panel -     Hepatic function panel -     Iron, TIBC and Ferritin Panel  Anxiety Assessment & Plan: Continue zoloft. Has good support. Notify me if feel needs any further intervention.    Anemia, unspecified type Assessment & Plan: Hgb 02/05/24 - 11.9.  follow.    Essential hypertension Assessment & Plan: Has been on amlodipine and lisinopril. Remain off hydrochlorothiazide. Blood pressure as outlined. No changes in medication. Follow pressures. Follow metabolic panel.    Hypercholesterolemia Assessment & Plan: On repatha.  Off zetia. Low cholesterol diet and exercise.  Follow lipid panel.   Lab Results  Component Value Date   CHOL 101 01/03/2024   HDL 47 01/03/2024   LDLCALC 36 01/03/2024   LDLDIRECT 32 01/03/2024   TRIG 82 01/03/2024   CHOLHDL 2.1 01/03/2024      Pain in both knees, unspecified chronicity Assessment & Plan: Some knee pain. Continue with PT. Follow.    Type 1 diabetes mellitus with hyperglycemia (HCC) Assessment & Plan: Low carb diet and exercise. She has adjusted her dosing of insulin and continues to adjust based on her sugar readings. Follow sugars. Send in readings.    Weakness Assessment & Plan: Persistent weakness. Working with PT. Gradually improving. Given persistent decreased stamina, some persistent brain fog, etc, recommend to remain out of work. Will reevaluate in the next  several weeks. Continue PT. Follow.       Dale Barlow, MD

## 2024-02-06 LAB — CBC WITH DIFFERENTIAL/PLATELET
Basophils Absolute: 0.1 10*3/uL (ref 0.0–0.2)
Basos: 1 %
EOS (ABSOLUTE): 0.1 10*3/uL (ref 0.0–0.4)
Eos: 1 %
Hematocrit: 36.8 % (ref 34.0–46.6)
Hemoglobin: 11.9 g/dL (ref 11.1–15.9)
Immature Grans (Abs): 0.1 10*3/uL (ref 0.0–0.1)
Immature Granulocytes: 1 %
Lymphocytes Absolute: 2.2 10*3/uL (ref 0.7–3.1)
Lymphs: 21 %
MCH: 28.9 pg (ref 26.6–33.0)
MCHC: 32.3 g/dL (ref 31.5–35.7)
MCV: 89 fL (ref 79–97)
Monocytes Absolute: 0.8 10*3/uL (ref 0.1–0.9)
Monocytes: 8 %
Neutrophils Absolute: 7.3 10*3/uL — ABNORMAL HIGH (ref 1.4–7.0)
Neutrophils: 68 %
Platelets: 402 10*3/uL (ref 150–450)
RBC: 4.12 x10E6/uL (ref 3.77–5.28)
RDW: 13 % (ref 11.7–15.4)
WBC: 10.6 10*3/uL (ref 3.4–10.8)

## 2024-02-06 LAB — HEPATIC FUNCTION PANEL
ALT: 14 IU/L (ref 0–32)
AST: 15 IU/L (ref 0–40)
Albumin: 4.2 g/dL (ref 3.9–4.9)
Alkaline Phosphatase: 71 IU/L (ref 44–121)
Bilirubin Total: 0.3 mg/dL (ref 0.0–1.2)
Bilirubin, Direct: 0.13 mg/dL (ref 0.00–0.40)
Total Protein: 7.2 g/dL (ref 6.0–8.5)

## 2024-02-06 LAB — BASIC METABOLIC PANEL
BUN/Creatinine Ratio: 16 (ref 12–28)
BUN: 13 mg/dL (ref 8–27)
CO2: 22 mmol/L (ref 20–29)
Calcium: 9.9 mg/dL (ref 8.7–10.3)
Chloride: 100 mmol/L (ref 96–106)
Creatinine, Ser: 0.79 mg/dL (ref 0.57–1.00)
Glucose: 197 mg/dL — ABNORMAL HIGH (ref 70–99)
Potassium: 4.3 mmol/L (ref 3.5–5.2)
Sodium: 139 mmol/L (ref 134–144)
eGFR: 83 mL/min/{1.73_m2} (ref >59)

## 2024-02-06 LAB — IRON,TIBC AND FERRITIN PANEL
Ferritin: 64 ng/mL (ref 15–150)
Iron Saturation: 13 % — ABNORMAL LOW (ref 15–55)
Iron: 45 ug/dL (ref 27–139)
Total Iron Binding Capacity: 335 ug/dL (ref 250–450)
UIBC: 290 ug/dL (ref 118–369)

## 2024-02-08 ENCOUNTER — Telehealth: Payer: Self-pay

## 2024-02-08 NOTE — Telephone Encounter (Signed)
 Will need a walk test to document not needing oxygen.

## 2024-02-08 NOTE — Telephone Encounter (Signed)
 Noted. Please document on schedule to do walk test for oxygen.

## 2024-02-08 NOTE — Telephone Encounter (Signed)
 Patient says she needs a D/C order for her oxygen through [employer]. She has not used oxygen at all since she was discharged from the hospital.

## 2024-02-08 NOTE — Telephone Encounter (Signed)
 Done

## 2024-02-08 NOTE — Telephone Encounter (Signed)
 Copied from CRM [id number]. Topic: General - Other >> Feb 08, 2024  9:22 AM Kathryne Eriksson wrote: Reason for CRM: Requesting A Call Back From [facility] >> Feb 08, 2024  9:25 AM Kathryne Eriksson wrote: Patient states she is requesting a call back from Dale Amsterdam, MD nurse Bethann [facility]. States it's in regards to her oxygen. Patient call back number is [phone]

## 2024-02-12 DIAGNOSIS — E7800 Pure hypercholesterolemia, unspecified: Secondary | ICD-10-CM

## 2024-02-12 DIAGNOSIS — F32A Depression, unspecified: Secondary | ICD-10-CM

## 2024-02-12 DIAGNOSIS — H409 Unspecified glaucoma: Secondary | ICD-10-CM

## 2024-02-12 DIAGNOSIS — G9341 Metabolic encephalopathy: Secondary | ICD-10-CM | POA: Diagnosis not present

## 2024-02-12 DIAGNOSIS — E871 Hypo-osmolality and hyponatremia: Secondary | ICD-10-CM | POA: Diagnosis not present

## 2024-02-12 DIAGNOSIS — J9601 Acute respiratory failure with hypoxia: Secondary | ICD-10-CM | POA: Diagnosis not present

## 2024-02-12 DIAGNOSIS — I10 Essential (primary) hypertension: Secondary | ICD-10-CM

## 2024-02-12 DIAGNOSIS — E1065 Type 1 diabetes mellitus with hyperglycemia: Secondary | ICD-10-CM

## 2024-02-12 DIAGNOSIS — F419 Anxiety disorder, unspecified: Secondary | ICD-10-CM

## 2024-02-12 DIAGNOSIS — M1990 Unspecified osteoarthritis, unspecified site: Secondary | ICD-10-CM

## 2024-02-12 DIAGNOSIS — J1000 Influenza due to other identified influenza virus with unspecified type of pneumonia: Secondary | ICD-10-CM | POA: Diagnosis not present

## 2024-02-14 ENCOUNTER — Encounter: Payer: Self-pay | Admitting: Internal Medicine

## 2024-02-14 NOTE — Assessment & Plan Note (Signed)
 Hgb 02/05/24 - 11.9. follow.

## 2024-02-14 NOTE — Assessment & Plan Note (Signed)
 Some knee pain. Continue with PT. Follow.

## 2024-02-14 NOTE — Assessment & Plan Note (Signed)
 Low carb diet and exercise. She has adjusted her dosing of insulin and continues to adjust based on her sugar readings. Follow sugars. Send in readings.

## 2024-02-14 NOTE — Assessment & Plan Note (Signed)
 Persistent weakness. Working with PT. Gradually improving. Given persistent decreased stamina, some persistent brain fog, etc, recommend to remain out of work. Will reevaluate in the next several weeks. Continue PT. Follow.

## 2024-02-14 NOTE — Assessment & Plan Note (Signed)
 Has been on amlodipine and lisinopril. Remain off hydrochlorothiazide. Blood pressure as outlined. No changes in medication. Follow pressures. Follow metabolic panel.

## 2024-02-14 NOTE — Assessment & Plan Note (Signed)
 Continue zoloft. Has good support. Notify me if feel needs any further intervention.

## 2024-02-14 NOTE — Assessment & Plan Note (Signed)
 On repatha.  Off zetia. Low cholesterol diet and exercise.  Follow lipid panel.   Lab Results  Component Value Date   CHOL 101 01/03/2024   HDL 47 01/03/2024   LDLCALC 36 01/03/2024   LDLDIRECT 32 01/03/2024   TRIG 82 01/03/2024   CHOLHDL 2.1 01/03/2024

## 2024-02-16 ENCOUNTER — Other Ambulatory Visit: Payer: Self-pay | Admitting: Internal Medicine

## 2024-03-14 ENCOUNTER — Encounter: Payer: Self-pay | Admitting: Internal Medicine

## 2024-03-14 ENCOUNTER — Ambulatory Visit (INDEPENDENT_AMBULATORY_CARE_PROVIDER_SITE_OTHER): Admitting: Internal Medicine

## 2024-03-14 VITALS — BP 128/64 | HR 74 | Temp 98.0°F | Resp 16 | Ht 64.0 in | Wt 250.0 lb

## 2024-03-14 DIAGNOSIS — I10 Essential (primary) hypertension: Secondary | ICD-10-CM | POA: Diagnosis not present

## 2024-03-14 DIAGNOSIS — F419 Anxiety disorder, unspecified: Secondary | ICD-10-CM

## 2024-03-14 DIAGNOSIS — D649 Anemia, unspecified: Secondary | ICD-10-CM

## 2024-03-14 DIAGNOSIS — R531 Weakness: Secondary | ICD-10-CM

## 2024-03-14 DIAGNOSIS — E7800 Pure hypercholesterolemia, unspecified: Secondary | ICD-10-CM

## 2024-03-14 DIAGNOSIS — E1065 Type 1 diabetes mellitus with hyperglycemia: Secondary | ICD-10-CM | POA: Diagnosis not present

## 2024-03-14 DIAGNOSIS — Z720 Tobacco use: Secondary | ICD-10-CM

## 2024-03-14 DIAGNOSIS — M25562 Pain in left knee: Secondary | ICD-10-CM

## 2024-03-14 DIAGNOSIS — Z860100 Personal history of colon polyps, unspecified: Secondary | ICD-10-CM

## 2024-03-14 DIAGNOSIS — M25561 Pain in right knee: Secondary | ICD-10-CM

## 2024-03-14 NOTE — Assessment & Plan Note (Addendum)
 Has been amlodipine  and lisinopril . Remain off hydrochlorothiazide . Blood pressure recheck wnl. No changes in medication. Follow pressures. Follow metabolic panel.  Leg elevation and compression hose to treat lower extremity swelling.

## 2024-03-14 NOTE — Assessment & Plan Note (Signed)
 Continue zoloft. Has good support. Notify me if feel needs any further intervention. Overall doing well.

## 2024-03-14 NOTE — Assessment & Plan Note (Signed)
 Last hgb 11.9.  follow

## 2024-03-14 NOTE — Progress Notes (Signed)
 Subjective:    Patient ID: Jill Crosby, female    DOB: [birth year], 63 y.o.   MRN: [id number]  Patient here for  Chief Complaint  Patient presents with   Medical Management of Chronic Issues    HPI Here for a scheduled follow up - Admitted 01/03/24 - 01/12/24 after presenting with - nausea, vomiting and altered mental status after respiratory infection. Tested positive for influenza A. Severe hyponatremia. She was encephalopathic and agitated and was intubated in ED. Extubated on 01/05/24. Discharged home on 2 L [place]. Discharged with recommendation to fluid restrict. Holding hydrochlorothiazide . Last visit, she had been working with PT. Distance walking had increased. She continues to do her exercises three times per day. Still weak. With bilateral knee pain as well. Plans to f/u with ortho. Has seen Bert Britain. Walked with PT and walked today. Does not require her oxygen. Appetite is better. Blood sugars in am - 100-160, mid am same, 3-4 pm 200-300 and 130-140 before evening mel. Taking lantus  50 units before bed and on average - 10 units in am and 10 units at lunch and 15 units with evening meal. Discussed diet. No chest pain. No sob. No increased cough or congestion. No abdominal pain or bowel change reported. Does report increased swelling - lower extremities. Resolves over night and if keeps legs elevated. She has stopped smoking and continues to not smoke.   Past Medical History:  Diagnosis Date   Anxiety    Depression    Elevated BP    Hypercholesterolemia    Increased BMI    Menopause    Morbid obesity (HCC)    Type I diabetes mellitus (HCC)    Past Surgical History:  Procedure Laterality Date   BREAST BIOPSY Left 05/09/2020   ribbon clip, stereo bx, HEMANGIOMA, BENIGN. - MAMMARY EPITHELIUM IS NOT PRESENT.   CATARACT EXTRACTION W/PHACO Left 11/08/2020   Procedure: CATARACT EXTRACTION PHACO AND INTRAOCULAR LENS PLACEMENT (IOC) LEFT DIABETIC 4.14 00:52.1 ;  Surgeon: Clair Crews, MD;  Location: [facility];  Service: Ophthalmology;  Laterality: Left;   CATARACT EXTRACTION W/PHACO Right 12/04/2020   Procedure: CATARACT EXTRACTION PHACO AND INTRAOCULAR LENS PLACEMENT (IOC) RIGHT DIABETIC 6.11 00:46.1;  Surgeon: Clair Crews, MD;  Location: [facility];  Service: Ophthalmology;  Laterality: Right;  Diabetic - insulin    CESAREAN SECTION  1997   COLONOSCOPY WITH PROPOFOL  N/A 01/18/2021   Procedure: COLONOSCOPY WITH PROPOFOL ;  Surgeon: Marnee Sink, MD;  Location: [facility];  Service: Endoscopy;  Laterality: N/A;  priority 4   Family History  Problem Relation Age of Onset   Hypertension Mother    Hyperlipidemia Mother    Colon polyps Mother    Cancer Father        lung   Colon cancer Maternal Grandfather    Diabetes Maternal Grandfather    Breast cancer Maternal Grandmother    Colon cancer Paternal Grandfather    Breast cancer Maternal Aunt    Heart disease Neg Hx    Ovarian cancer Neg Hx    Social History   Socioeconomic History   Marital status: Single    Spouse name: Not on file   Number of children: Not on file   Years of education: Not on file   Highest education level: 12th grade  Occupational History   Not on file  Tobacco Use   Smoking status: Former    Current packs/day: 0.50    Average packs/day: 0.5 packs/day for 20.0 years (10.0  ttl pk-yrs)    Types: Cigarettes   Smokeless tobacco: Never  Substance and Sexual Activity   Alcohol use: No    Alcohol/week: 0.0 standard drinks of alcohol   Drug use: No   Sexual activity: Not Currently    Birth control/protection: Post-menopausal  Other Topics Concern   Not on file  Social History Narrative   Not on file   Social Drivers of Health   Financial Resource Strain: Low Risk  (02/01/2024)   Overall Financial Resource Strain (CARDIA)    Difficulty of Paying Living Expenses: Not very hard  Food Insecurity: No Food Insecurity (02/01/2024)   Hunger Vital Sign     Worried About Running Out of Food in the Last Year: Never true    Ran Out of Food in the Last Year: Never true  Transportation Needs: No Transportation Needs (02/01/2024)   PRAPARE - Administrator, Civil Service (Medical): No    Lack of Transportation (Non-Medical): No  Physical Activity: Unknown (02/01/2024)   Exercise Vital Sign    Days of Exercise per Week: 0 days    Minutes of Exercise per Session: Not on file  Stress: No Stress Concern Present (02/01/2024)   [employer] of Occupational Health - Occupational Stress Questionnaire    Feeling of Stress : Only a little  Social Connections: Moderately Isolated (02/01/2024)   Social Connection and Isolation Panel [NHANES]    Frequency of Communication with Friends and Family: More than three times a week    Frequency of Social Gatherings with Friends and Family: Once a week    Attends Religious Services: 1 to 4 times per year    Active Member of [employer] or Organizations: No    Attends Banker Meetings: Patient unable to answer    Marital Status: Divorced     Review of Systems  Constitutional:  Positive for fatigue.       Appetite has improved.   HENT:  Negative for congestion and sinus pressure.   Respiratory:  Negative for cough, chest tightness and shortness of breath.   Cardiovascular:  Positive for leg swelling. Negative for chest pain and palpitations.  Gastrointestinal:  Negative for abdominal pain, diarrhea, nausea and vomiting.  Genitourinary:  Negative for difficulty urinating and dysuria.  Musculoskeletal:  Negative for myalgias.       Knee pain as outlined.   Skin:  Negative for color change and rash.  Neurological:  Negative for dizziness and headaches.  Psychiatric/Behavioral:  Negative for agitation and dysphoric mood.        Objective:     BP 128/64   Pulse 74   Temp 98 F (36.7 C)   Resp 16   Ht 5\' 4"  (1.626 m)   Wt 250 lb (113.4 kg)   SpO2 97%   BMI 42.91 kg/m  Wt Readings from  Last 3 Encounters:  03/14/24 250 lb (113.4 kg)  02/05/24 241 lb (109.3 kg)  01/15/24 240 lb (108.9 kg)    Physical Exam Vitals reviewed.  Constitutional:      General: She is not in acute distress.    Appearance: Normal appearance.  HENT:     Head: Normocephalic and atraumatic.     Right Ear: External ear normal.     Left Ear: External ear normal.     Mouth/Throat:     Pharynx: No oropharyngeal exudate or posterior oropharyngeal erythema.  Eyes:     General: No scleral icterus.       Right  eye: No discharge.        Left eye: No discharge.     Conjunctiva/sclera: Conjunctivae normal.  Neck:     Thyroid: No thyromegaly.  Cardiovascular:     Rate and Rhythm: Normal rate and regular rhythm.  Pulmonary:     Effort: No respiratory distress.     Breath sounds: Normal breath sounds. No wheezing.  Abdominal:     General: Bowel sounds are normal.     Palpations: Abdomen is soft.     Tenderness: There is no abdominal tenderness.  Musculoskeletal:        General: No tenderness.     Cervical back: Neck supple. No tenderness.     Comments: Lower extremity swelling. No increased erythema.   Lymphadenopathy:     Cervical: No cervical adenopathy.  Skin:    Findings: No erythema or rash.  Neurological:     Mental Status: She is alert.  Psychiatric:        Mood and Affect: Mood normal.        Behavior: Behavior normal.         Outpatient Encounter Medications as of 03/14/2024  Medication Sig   amLODipine  (NORVASC ) 10 MG tablet TAKE 1 TABLET BY MOUTH DAILY   BAYER MICROLET LANCETS lancets USE AS DIRECTED TO CHECK  SUGARS 1 TO 2 TIMES A DAY   cholecalciferol (VITAMIN D3) 25 MCG (1000 UT) tablet Take 1,000 Units by mouth daily.   CONTOUR NEXT TEST test strip USE TO CHECK BLOOD GLUCOSE 6 TO  7 TIMES DAILY   Cyanocobalamin  (B-12 PO) Take by mouth.   insulin  lispro (HUMALOG  KWIKPEN) 100 UNIT/ML KwikPen INJECT SUBCUTANEOUSLY 65  UNITS DAILY.  (Takes distributed over three meals  counting carbs)   Insulin  Pen Needle 32G X 4 MM MISC Use as directed with insulin  4 x daily.   LANTUS  SOLOSTAR 100 UNIT/ML Solostar Pen INJECT SUBCUTANEOUSLY 80 UNITS  AT NIGHT   levocetirizine (XYZAL ) 5 MG tablet Take 1 tablet (5 mg total) by mouth every evening.   lisinopril  (ZESTRIL ) 40 MG tablet Take 1 tablet (40 mg total) by mouth daily.   meloxicam (MOBIC) 7.5 MG tablet Take 7.5 mg by mouth daily.   nystatin  cream (MYCOSTATIN ) Apply 1 Application topically 2 (two) times daily.   REPATHA  SURECLICK 140 MG/ML SOAJ INJECT 1 PEN SUBCUTANEOUSLY  EVERY 2 WEEKS   sertraline  (ZOLOFT ) 50 MG tablet TAKE 1 TABLET BY MOUTH DAILY   No facility-administered encounter medications on file as of 03/14/2024.     Lab Results  Component Value Date   WBC 10.6 02/05/2024   HGB 11.9 02/05/2024   HCT 36.8 02/05/2024   PLT 402 02/05/2024   GLUCOSE 197 (H) 02/05/2024   CHOL 101 01/03/2024   TRIG 82 01/03/2024   HDL 47 01/03/2024   LDLDIRECT 32 01/03/2024   LDLCALC 36 01/03/2024   ALT 14 02/05/2024   AST 15 02/05/2024   NA 139 02/05/2024   K 4.3 02/05/2024   CL 100 02/05/2024   CREATININE 0.79 02/05/2024   BUN 13 02/05/2024   CO2 22 02/05/2024   TSH 0.617 01/03/2024   HGBA1C 6.9 (H) 01/04/2024    ECHOCARDIOGRAM COMPLETE Result Date: 01/05/2024    ECHOCARDIOGRAM REPORT   Patient Name:   [facility] A Dunaj Date of Exam: 01/05/2024 Medical Rec #:  [id number]        Height:       64.0 in Accession #:    [id number]  Weight:       264.1 lb Date of Birth:  1961-06-19        BSA:          2.202 m Patient Age:    62 years         BP:           98/53 mmHg Patient Gender: F                HR:           51 bpm. Exam Location:  [facility] Procedure: 2D Echo, Cardiac Doppler and Color Doppler Indications:     Dyspnea  History:         Patient has no prior history of Echocardiogram examinations.                  Signs/Symptoms:Dyspnea and Dizziness/Lightheadedness; Risk                  Factors:Hypertension, Diabetes and  Current Smoker.  Sonographer:     Clarke Crouch Referring Phys:  [id number] KHABIB DGAYLI Diagnosing Phys: Sammy Crisp MD  Sonographer Comments: Technically difficult study due to poor echo windows, echo performed with patient supine and on artificial respirator and patient is obese. IMPRESSIONS  1. Left ventricular ejection fraction, by estimation, is 55 to 60%. The left ventricle has normal function. Left ventricular endocardial border not optimally defined to evaluate regional wall motion. The left ventricular internal cavity size was mildly dilated. There is mild left ventricular hypertrophy. Left ventricular diastolic parameters are consistent with Grade II diastolic dysfunction (pseudonormalization).  2. Right ventricular systolic function is normal. The right ventricular size is normal.  3. Left atrial size was mildly dilated.  4. The mitral valve is grossly normal. Mild mitral valve regurgitation. No evidence of mitral stenosis.  5. The aortic valve is tricuspid. Aortic valve regurgitation is not visualized. No aortic stenosis is present. FINDINGS  Left Ventricle: Left ventricular ejection fraction, by estimation, is 55 to 60%. The left ventricle has normal function. Left ventricular endocardial border not optimally defined to evaluate regional wall motion. The left ventricular internal cavity size was mildly dilated. There is mild left ventricular hypertrophy. Left ventricular diastolic parameters are consistent with Grade II diastolic dysfunction (pseudonormalization). Right Ventricle: The right ventricular size is normal. No increase in right ventricular wall thickness. Right ventricular systolic function is normal. Left Atrium: Left atrial size was mildly dilated. Right Atrium: Right atrial size was normal in size. Pericardium: There is no evidence of pericardial effusion. Mitral Valve: The mitral valve is grossly normal. Mild mitral valve regurgitation. No evidence of mitral valve stenosis. MV peak  gradient, 5.2 mmHg. The mean mitral valve gradient is 2.0 mmHg. Tricuspid Valve: The tricuspid valve is not well visualized. Tricuspid valve regurgitation is not demonstrated. Aortic Valve: The aortic valve is tricuspid. There is mild aortic valve annular calcification. Aortic valve regurgitation is not visualized. No aortic stenosis is present. Aortic valve mean gradient measures 8.0 mmHg. Aortic valve peak gradient measures 15.5 mmHg. Aortic valve area, by VTI measures 1.66 cm. Pulmonic Valve: The pulmonic valve was not well visualized. Pulmonic valve regurgitation is not visualized. No evidence of pulmonic stenosis. Aorta: The aortic root is normal in size and structure. Venous: IVC assessment for right atrial pressure unable to be performed due to mechanical ventilation. IAS/Shunts: The interatrial septum was not well visualized.  LEFT VENTRICLE PLAX 2D LVIDd:         5.30 cm  Diastology LVIDs:         3.00 cm     LV e' medial:    7.51 cm/s LV PW:         1.10 cm     LV E/e' medial:  15.6 LV IVS:        1.00 cm     LV e' lateral:   7.83 cm/s LVOT diam:     2.00 cm     LV E/e' lateral: 14.9 LV SV:         82 LV SV Index:   37 LVOT Area:     3.14 cm  LV Volumes (MOD) LV vol d, MOD A2C: 52.9 ml LV vol d, MOD A4C: 54.9 ml LV vol s, MOD A2C: 19.3 ml LV vol s, MOD A4C: 24.6 ml LV SV MOD A2C:     33.6 ml LV SV MOD A4C:     54.9 ml LV SV MOD BP:      33.2 ml RIGHT VENTRICLE RV Basal diam:  2.90 cm RV Mid diam:    2.80 cm RV S prime:     9.03 cm/s TAPSE (M-mode): 2.4 cm LEFT ATRIUM             Index        RIGHT ATRIUM           Index LA diam:        4.60 cm 2.09 cm/m   RA Area:     15.30 cm LA Vol (A2C):   76.3 ml 34.65 ml/m  RA Volume:   39.00 ml  17.71 ml/m LA Vol (A4C):   58.9 ml 26.75 ml/m LA Biplane Vol: 71.6 ml 32.52 ml/m  AORTIC VALVE                     PULMONIC VALVE AV Area (Vmax):    1.66 cm      PV Vmax:       0.98 m/s AV Area (Vmean):   1.67 cm      PV Peak grad:  3.8 mmHg AV Area (VTI):      1.66 cm AV Vmax:           197.00 cm/s AV Vmean:          131.000 cm/s AV VTI:            0.491 m AV Peak Grad:      15.5 mmHg AV Mean Grad:      8.0 mmHg LVOT Vmax:         104.00 cm/s LVOT Vmean:        69.600 cm/s LVOT VTI:          0.260 m LVOT/AV VTI ratio: 0.53  AORTA Ao Root diam: 3.10 cm MITRAL VALVE MV Area (PHT): 3.02 cm     SHUNTS MV Area VTI:   1.68 cm     Systemic VTI:  0.26 m MV Peak grad:  5.2 mmHg     Systemic Diam: 2.00 cm MV Mean grad:  2.0 mmHg MV Vmax:       1.14 m/s MV Vmean:      57.9 cm/s MV Decel Time: 251 msec MV E velocity: 117.00 cm/s MV A velocity: 87.40 cm/s MV E/A ratio:  1.34 Jill Crosby End MD Electronically signed by Sammy Crisp MD Signature Date/Time: 01/05/2024/10:09:06 AM    Final        Assessment & Plan:  Anemia, unspecified type Assessment & Plan: Last  hgb 11.9.  follow    Anxiety Assessment & Plan: Continue zoloft . Has good support. Notify me if feel needs any further intervention. Overall doing well.    Essential hypertension Assessment & Plan: Has been amlodipine  and lisinopril . Remain off hydrochlorothiazide . Blood pressure recheck wnl. No changes in medication. Follow pressures. Follow metabolic panel.  Leg elevation and compression hose to treat lower extremity swelling.    Type 1 diabetes mellitus with hyperglycemia (HCC) Assessment & Plan: Low carb diet and exercise. She has adjusted her dosing of insulin  and continues to adjust based on her sugar readings. Follow sugars. Discussed CGM.    Tobacco use Assessment & Plan: Not smoking now. Follow.    History of colonic polyps Assessment & Plan: Colonoscopy 01/2021 - normal (Dr Ole Berkeley).  Recommended f/u colonoscopy in 5 years.    Hypercholesterolemia Assessment & Plan: On repatha .  Off zetia . Low cholesterol diet and exercise.  Follow lipid panel.  Lab Results  Component Value Date   CHOL 101 01/03/2024   HDL 47 01/03/2024   LDLCALC 36 01/03/2024   LDLDIRECT 32 01/03/2024   TRIG  82 01/03/2024   CHOLHDL 2.1 01/03/2024      Pain in both knees, unspecified chronicity Assessment & Plan: Increased pain. Plans to f/u with ortho.    Weakness Assessment & Plan: Persistent weakness. Working with PT. Gradually improving. Given persistent decreased stamina, some persistent brain fog, etc, recommend to continue to remain out of work. Continue PT.       Dellar Fenton, MD

## 2024-03-15 ENCOUNTER — Telehealth: Payer: Self-pay

## 2024-03-15 NOTE — Telephone Encounter (Signed)
Noted.  Let me know what I need to do.   

## 2024-03-15 NOTE — Telephone Encounter (Signed)
 Copied from CRM [id number]. Topic: General - Other >> Mar 15, 2024 12:54 PM Luane Rumps D wrote: Reason for CRM: Ms. Mousel calling to leave a message for Dr. Thayne Fine nurse Milana Ali. She would like her to give her a call back at [phone] in regards to her work leave. Patient stated that she needs new paperwork submitted.

## 2024-03-15 NOTE — Telephone Encounter (Signed)
 See me. Discussed with patient. She has a new return to work date. The lady that she was talking to about her paper work said we could send old forms with changed return to work date if you sign and date the change.

## 2024-03-17 NOTE — Telephone Encounter (Signed)
 FMLA placed out for signature

## 2024-03-19 ENCOUNTER — Encounter: Payer: Self-pay | Admitting: Internal Medicine

## 2024-03-19 NOTE — Assessment & Plan Note (Signed)
 Increased pain. Plans to f/u with ortho.

## 2024-03-19 NOTE — Assessment & Plan Note (Signed)
 Low carb diet and exercise. She has adjusted her dosing of insulin  and continues to adjust based on her sugar readings. Follow sugars. Discussed CGM.

## 2024-03-19 NOTE — Assessment & Plan Note (Signed)
 Persistent weakness. Working with PT. Gradually improving. Given persistent decreased stamina, some persistent brain fog, etc, recommend to continue to remain out of work. Continue PT.

## 2024-03-19 NOTE — Assessment & Plan Note (Signed)
 On repatha.  Off zetia. Low cholesterol diet and exercise.  Follow lipid panel.   Lab Results  Component Value Date   CHOL 101 01/03/2024   HDL 47 01/03/2024   LDLCALC 36 01/03/2024   LDLDIRECT 32 01/03/2024   TRIG 82 01/03/2024   CHOLHDL 2.1 01/03/2024

## 2024-03-19 NOTE — Assessment & Plan Note (Signed)
Colonoscopy 01/2021 - normal (Dr Allen Norris).  Recommended f/u colonoscopy in 5 years.

## 2024-03-19 NOTE — Assessment & Plan Note (Signed)
Not smoking now.  Follow.

## 2024-03-28 ENCOUNTER — Telehealth: Payer: Self-pay

## 2024-03-28 NOTE — Telephone Encounter (Signed)
 Copied from CRM [id number]. Topic: General - Other >> Mar 28, 2024 10:06 AM Allyne Areola wrote: Reason for CRM: Patient is calling to follow up with Dr.Scott's nurse regarding an FMLA form as well as a form that needed to be sent to [employer] regarding oxygen.

## 2024-03-29 NOTE — Telephone Encounter (Signed)
 Advised patient that her d/c order has been faxed to [employer] and her forms have been refaxed. Pt will let me know if anything further is needed.

## 2024-03-29 NOTE — Telephone Encounter (Signed)
 D/c order for oxygen printed for signature

## 2024-04-20 ENCOUNTER — Other Ambulatory Visit (HOSPITAL_COMMUNITY): Payer: Self-pay

## 2024-04-26 ENCOUNTER — Ambulatory Visit: Admitting: Internal Medicine

## 2024-04-27 ENCOUNTER — Ambulatory Visit (INDEPENDENT_AMBULATORY_CARE_PROVIDER_SITE_OTHER): Admitting: Internal Medicine

## 2024-04-27 VITALS — BP 138/72 | HR 90 | Temp 97.9°F | Resp 16 | Ht 64.0 in | Wt 269.2 lb

## 2024-04-27 DIAGNOSIS — R609 Edema, unspecified: Secondary | ICD-10-CM

## 2024-04-27 DIAGNOSIS — M25562 Pain in left knee: Secondary | ICD-10-CM

## 2024-04-27 DIAGNOSIS — M25561 Pain in right knee: Secondary | ICD-10-CM

## 2024-04-27 DIAGNOSIS — I10 Essential (primary) hypertension: Secondary | ICD-10-CM | POA: Diagnosis not present

## 2024-04-27 DIAGNOSIS — E7800 Pure hypercholesterolemia, unspecified: Secondary | ICD-10-CM

## 2024-04-27 DIAGNOSIS — F419 Anxiety disorder, unspecified: Secondary | ICD-10-CM

## 2024-04-27 DIAGNOSIS — D649 Anemia, unspecified: Secondary | ICD-10-CM

## 2024-04-27 DIAGNOSIS — E1065 Type 1 diabetes mellitus with hyperglycemia: Secondary | ICD-10-CM

## 2024-04-27 DIAGNOSIS — E871 Hypo-osmolality and hyponatremia: Secondary | ICD-10-CM

## 2024-04-27 DIAGNOSIS — R531 Weakness: Secondary | ICD-10-CM

## 2024-04-27 LAB — HM DIABETES FOOT EXAM

## 2024-04-27 MED ORDER — FUROSEMIDE 20 MG PO TABS: 20.0000 mg | ORAL_TABLET | Freq: Every day | ORAL | 0 refills | Status: DC | PRN

## 2024-04-27 NOTE — Progress Notes (Signed)
 Subjective:    Patient ID: Jill Crosby, female    DOB: 11-Apr-1961, 63 y.o.   MRN: [id number]  Patient here for  Chief Complaint  Patient presents with   Medical Management of Chronic Issues    HPI Here for a scheduled follow up. Admitted 01/03/24 - 01/12/24 after presenting with - nausea, vomiting and altered mental status after respiratory infection. Tested positive for influenza A. Severe hyponatremia. She was encephalopathic and agitated and was intubated in ED. Extubated on 01/05/24. Discharged home on 2 L [place]. Discharged with recommendation to fluid restrict. Holding hydrochlorothiazide . Has been on amlodipine  and lisinopril . Worked with PT. Has been out of work due to above. Still with some fatigue. She is eating. Sugars have been higher per her reported. She has been adjusting her insulin . Lantus  - 55 units q pm and 30 units q am. Has been given 10 units/15 grams of carb for her meal (averaging 20-25 units). States am sugars averaging 117-134. She does intermittently give a correction dose. Reports increased weight gain. Increased lower extremity swelling and lower abdominal swelling. Also with increased knee pain. Limiting her activity. Has seen ortho. Request referral back to discuss gel injections. No chest pain. Breathing overall stable.    Past Medical History:  Diagnosis Date   Anxiety    Depression    Elevated BP    Hypercholesterolemia    Increased BMI    Menopause    Morbid obesity (HCC)    Type I diabetes mellitus (HCC)    Past Surgical History:  Procedure Laterality Date   BREAST BIOPSY Left 05/09/2020   ribbon clip, stereo bx, HEMANGIOMA, BENIGN. - MAMMARY EPITHELIUM IS NOT PRESENT.   CATARACT EXTRACTION W/PHACO Left 11/08/2020   Procedure: CATARACT EXTRACTION PHACO AND INTRAOCULAR LENS PLACEMENT (IOC) LEFT DIABETIC 4.14 00:52.1 ;  Surgeon: Clair Crews, MD;  Location: [facility];  Service: Ophthalmology;  Laterality: Left;   CATARACT EXTRACTION  W/PHACO Right 12/04/2020   Procedure: CATARACT EXTRACTION PHACO AND INTRAOCULAR LENS PLACEMENT (IOC) RIGHT DIABETIC 6.11 00:46.1;  Surgeon: Clair Crews, MD;  Location: [facility];  Service: Ophthalmology;  Laterality: Right;  Diabetic - insulin    CESAREAN SECTION  1997   COLONOSCOPY WITH PROPOFOL  N/A 01/18/2021   Procedure: COLONOSCOPY WITH PROPOFOL ;  Surgeon: Marnee Sink, MD;  Location: [facility];  Service: Endoscopy;  Laterality: N/A;  priority 4   Family History  Problem Relation Age of Onset   Hypertension Mother    Hyperlipidemia Mother    Colon polyps Mother    Cancer Father        lung   Colon cancer Maternal Grandfather    Diabetes Maternal Grandfather    Breast cancer Maternal Grandmother    Colon cancer Paternal Grandfather    Breast cancer Maternal Aunt    Heart disease Neg Hx    Ovarian cancer Neg Hx    Social History   Socioeconomic History   Marital status: Single    Spouse name: Not on file   Number of children: Not on file   Years of education: Not on file   Highest education level: 12th grade  Occupational History   Not on file  Tobacco Use   Smoking status: Former    Current packs/day: 0.50    Average packs/day: 0.5 packs/day for 20.0 years (10.0 ttl pk-yrs)    Types: Cigarettes   Smokeless tobacco: Never  Substance and Sexual Activity   Alcohol use: No    Alcohol/week: 0.0 standard  drinks of alcohol   Drug use: No   Sexual activity: Not Currently    Birth control/protection: Post-menopausal  Other Topics Concern   Not on file  Social History Narrative   Not on file   Social Drivers of Health   Financial Resource Strain: Low Risk  (02/01/2024)   Overall Financial Resource Strain (CARDIA)    Difficulty of Paying Living Expenses: Not very hard  Food Insecurity: No Food Insecurity (02/01/2024)   Hunger Vital Sign    Worried About Running Out of Food in the Last Year: Never true    Ran Out of Food in the Last Year: Never true   Transportation Needs: No Transportation Needs (02/01/2024)   PRAPARE - Administrator, Civil Service (Medical): No    Lack of Transportation (Non-Medical): No  Physical Activity: Unknown (02/01/2024)   Exercise Vital Sign    Days of Exercise per Week: 0 days    Minutes of Exercise per Session: Not on file  Stress: No Stress Concern Present (02/01/2024)   [employer] of Occupational Health - Occupational Stress Questionnaire    Feeling of Stress : Only a little  Social Connections: Moderately Isolated (02/01/2024)   Social Connection and Isolation Panel [NHANES]    Frequency of Communication with Friends and Family: More than three times a week    Frequency of Social Gatherings with Friends and Family: Once a week    Attends Religious Services: 1 to 4 times per year    Active Member of [employer] or Organizations: No    Attends Banker Meetings: Patient unable to answer    Marital Status: Divorced     Review of Systems  Constitutional:  Positive for fatigue. Negative for fever.  HENT:  Negative for congestion and sinus pressure.   Respiratory:  Negative for cough and chest tightness.        No increased sob.   Cardiovascular:  Positive for leg swelling. Negative for chest pain and palpitations.  Gastrointestinal:  Negative for diarrhea and vomiting.  Genitourinary:  Negative for difficulty urinating and dysuria.  Musculoskeletal:  Negative for myalgias.       Knee pain as outlined.   Skin:  Negative for color change and rash.  Neurological:  Negative for dizziness and headaches.  Psychiatric/Behavioral:  Negative for agitation and dysphoric mood.        Objective:     BP 138/72   Pulse 90   Temp 97.9 F (36.6 C)   Resp 16   Ht 5\' 4"  (1.626 m)   Wt 269 lb 3.2 oz (122.1 kg)   SpO2 97%   BMI 46.21 kg/m  Wt Readings from Last 3 Encounters:  04/27/24 269 lb 3.2 oz (122.1 kg)  03/14/24 250 lb (113.4 kg)  02/05/24 241 lb (109.3 kg)    Physical  Exam Vitals reviewed.  Constitutional:      General: She is not in acute distress.    Appearance: Normal appearance.  HENT:     Head: Normocephalic and atraumatic.     Right Ear: External ear normal.     Left Ear: External ear normal.     Mouth/Throat:     Pharynx: No oropharyngeal exudate or posterior oropharyngeal erythema.  Eyes:     General: No scleral icterus.       Right eye: No discharge.        Left eye: No discharge.     Conjunctiva/sclera: Conjunctivae normal.  Neck:  Thyroid: No thyromegaly.  Cardiovascular:     Rate and Rhythm: Normal rate and regular rhythm.  Pulmonary:     Effort: No respiratory distress.     Breath sounds: Normal breath sounds. No wheezing.  Abdominal:     General: Bowel sounds are normal.     Palpations: Abdomen is soft.     Tenderness: There is no abdominal tenderness.  Musculoskeletal:     Cervical back: Neck supple. No tenderness.     Comments: Increased swelling - to knees - pedal and lower extremity swelling. No increased erythema.   Lymphadenopathy:     Cervical: No cervical adenopathy.  Skin:    Findings: No erythema or rash.  Neurological:     Mental Status: She is alert.  Psychiatric:        Mood and Affect: Mood normal.        Behavior: Behavior normal.      Diabetic foot exam was performed with the following findings:   No deformities, ulcerations, or other skin breakdown Normal sensation of 10g monofilament Increased pedal and lower extremity swelling.       Outpatient Encounter Medications as of 04/27/2024  Medication Sig   furosemide  (LASIX ) 20 MG tablet Take 1 tablet (20 mg total) by mouth daily as needed.   amLODipine  (NORVASC ) 10 MG tablet TAKE 1 TABLET BY MOUTH DAILY   BAYER MICROLET LANCETS lancets USE AS DIRECTED TO CHECK  SUGARS 1 TO 2 TIMES A DAY   cholecalciferol (VITAMIN D3) 25 MCG (1000 UT) tablet Take 1,000 Units by mouth daily.   CONTOUR NEXT TEST test strip USE TO CHECK BLOOD GLUCOSE 6 TO  7 TIMES  DAILY   Cyanocobalamin  (B-12 PO) Take by mouth.   insulin  lispro (HUMALOG  KWIKPEN) 100 UNIT/ML KwikPen INJECT SUBCUTANEOUSLY 65  UNITS DAILY.  (Takes distributed over three meals counting carbs)   Insulin  Pen Needle 32G X 4 MM MISC Use as directed with insulin  4 x daily.   LANTUS  SOLOSTAR 100 UNIT/ML Solostar Pen INJECT SUBCUTANEOUSLY 80 UNITS  AT NIGHT   levocetirizine (XYZAL ) 5 MG tablet Take 1 tablet (5 mg total) by mouth every evening.   lisinopril  (ZESTRIL ) 40 MG tablet Take 1 tablet (40 mg total) by mouth daily.   meloxicam (MOBIC) 7.5 MG tablet Take 7.5 mg by mouth daily.   nystatin  cream (MYCOSTATIN ) Apply 1 Application topically 2 (two) times daily.   REPATHA  SURECLICK 140 MG/ML SOAJ INJECT 1 PEN SUBCUTANEOUSLY  EVERY 2 WEEKS   sertraline  (ZOLOFT ) 50 MG tablet TAKE 1 TABLET BY MOUTH DAILY   No facility-administered encounter medications on file as of 04/27/2024.     Lab Results  Component Value Date   WBC 12.9 (H) 04/27/2024   HGB 12.2 04/27/2024   HCT 37.0 04/27/2024   PLT 338 04/27/2024   GLUCOSE 118 (H) 04/27/2024   CHOL 129 04/27/2024   TRIG 255 (H) 04/27/2024   HDL 60 04/27/2024   LDLDIRECT 32 01/03/2024   LDLCALC 31 04/27/2024   ALT 31 04/27/2024   AST 30 04/27/2024   NA 137 04/27/2024   K 4.7 04/27/2024   CL 95 (L) 04/27/2024   CREATININE 1.02 (H) 04/27/2024   BUN 16 04/27/2024   CO2 22 04/27/2024   TSH 2.010 04/27/2024   HGBA1C 7.4 (H) 04/27/2024    ECHOCARDIOGRAM COMPLETE Result Date: 01/05/2024    ECHOCARDIOGRAM REPORT   Patient Name:   [facility] [place] Date of Exam: 01/05/2024 Medical Rec #:  [id number]  Height:       64.0 in Accession #:    [id number]       Weight:       264.1 lb Date of Birth:  01/10/1961        BSA:          2.202 m Patient Age:    62 years         BP:           98/53 mmHg Patient Gender: F                HR:           51 bpm. Exam Location:  [facility] Procedure: 2D Echo, Cardiac Doppler and Color Doppler Indications:     Dyspnea  History:          Patient has no prior history of Echocardiogram examinations.                  Signs/Symptoms:Dyspnea and Dizziness/Lightheadedness; Risk                  Factors:Hypertension, Diabetes and Current Smoker.  Sonographer:     Clarke Crouch Referring Phys:  [id number] KHABIB DGAYLI Diagnosing Phys: Sammy Crisp MD  Sonographer Comments: Technically difficult study due to poor echo windows, echo performed with patient supine and on artificial respirator and patient is obese. IMPRESSIONS  1. Left ventricular ejection fraction, by estimation, is 55 to 60%. The left ventricle has normal function. Left ventricular endocardial border not optimally defined to evaluate regional wall motion. The left ventricular internal cavity size was mildly dilated. There is mild left ventricular hypertrophy. Left ventricular diastolic parameters are consistent with Grade II diastolic dysfunction (pseudonormalization).  2. Right ventricular systolic function is normal. The right ventricular size is normal.  3. Left atrial size was mildly dilated.  4. The mitral valve is grossly normal. Mild mitral valve regurgitation. No evidence of mitral stenosis.  5. The aortic valve is tricuspid. Aortic valve regurgitation is not visualized. No aortic stenosis is present. FINDINGS  Left Ventricle: Left ventricular ejection fraction, by estimation, is 55 to 60%. The left ventricle has normal function. Left ventricular endocardial border not optimally defined to evaluate regional wall motion. The left ventricular internal cavity size was mildly dilated. There is mild left ventricular hypertrophy. Left ventricular diastolic parameters are consistent with Grade II diastolic dysfunction (pseudonormalization). Right Ventricle: The right ventricular size is normal. No increase in right ventricular wall thickness. Right ventricular systolic function is normal. Left Atrium: Left atrial size was mildly dilated. Right Atrium: Right atrial size was normal in  size. Pericardium: There is no evidence of pericardial effusion. Mitral Valve: The mitral valve is grossly normal. Mild mitral valve regurgitation. No evidence of mitral valve stenosis. MV peak gradient, 5.2 mmHg. The mean mitral valve gradient is 2.0 mmHg. Tricuspid Valve: The tricuspid valve is not well visualized. Tricuspid valve regurgitation is not demonstrated. Aortic Valve: The aortic valve is tricuspid. There is mild aortic valve annular calcification. Aortic valve regurgitation is not visualized. No aortic stenosis is present. Aortic valve mean gradient measures 8.0 mmHg. Aortic valve peak gradient measures 15.5 mmHg. Aortic valve area, by VTI measures 1.66 cm. Pulmonic Valve: The pulmonic valve was not well visualized. Pulmonic valve regurgitation is not visualized. No evidence of pulmonic stenosis. Aorta: The aortic root is normal in size and structure. Venous: IVC assessment for right atrial pressure unable to be performed due to mechanical ventilation. IAS/Shunts: The interatrial septum was  not well visualized.  LEFT VENTRICLE PLAX 2D LVIDd:         5.30 cm     Diastology LVIDs:         3.00 cm     LV e' medial:    7.51 cm/s LV PW:         1.10 cm     LV E/e' medial:  15.6 LV IVS:        1.00 cm     LV e' lateral:   7.83 cm/s LVOT diam:     2.00 cm     LV E/e' lateral: 14.9 LV SV:         82 LV SV Index:   37 LVOT Area:     3.14 cm  LV Volumes (MOD) LV vol d, MOD A2C: 52.9 ml LV vol d, MOD A4C: 54.9 ml LV vol s, MOD A2C: 19.3 ml LV vol s, MOD A4C: 24.6 ml LV SV MOD A2C:     33.6 ml LV SV MOD A4C:     54.9 ml LV SV MOD BP:      33.2 ml RIGHT VENTRICLE RV Basal diam:  2.90 cm RV Mid diam:    2.80 cm RV S prime:     9.03 cm/s TAPSE (M-mode): 2.4 cm LEFT ATRIUM             Index        RIGHT ATRIUM           Index LA diam:        4.60 cm 2.09 cm/m   RA Area:     15.30 cm LA Vol (A2C):   76.3 ml 34.65 ml/m  RA Volume:   39.00 ml  17.71 ml/m LA Vol (A4C):   58.9 ml 26.75 ml/m LA Biplane Vol: 71.6 ml  32.52 ml/m  AORTIC VALVE                     PULMONIC VALVE AV Area (Vmax):    1.66 cm      PV Vmax:       0.98 m/s AV Area (Vmean):   1.67 cm      PV Peak grad:  3.8 mmHg AV Area (VTI):     1.66 cm AV Vmax:           197.00 cm/s AV Vmean:          131.000 cm/s AV VTI:            0.491 m AV Peak Grad:      15.5 mmHg AV Mean Grad:      8.0 mmHg LVOT Vmax:         104.00 cm/s LVOT Vmean:        69.600 cm/s LVOT VTI:          0.260 m LVOT/AV VTI ratio: 0.53  AORTA Ao Root diam: 3.10 cm MITRAL VALVE MV Area (PHT): 3.02 cm     SHUNTS MV Area VTI:   1.68 cm     Systemic VTI:  0.26 m MV Peak grad:  5.2 mmHg     Systemic Diam: 2.00 cm MV Mean grad:  2.0 mmHg MV Vmax:       1.14 m/s MV Vmean:      57.9 cm/s MV Decel Time: 251 msec MV E velocity: 117.00 cm/s MV A velocity: 87.40 cm/s MV E/A ratio:  1.34 Veryl Gottron End MD Electronically signed by Sammy Crisp MD Signature Date/Time: 01/05/2024/10:09:06  AM    Final        Assessment & Plan:  Essential hypertension Assessment & Plan: Has been amlodipine  and lisinopril . Has been off hydrochlorothiazide , given recent admission for extremely low sodium.  Blood pressure has been ok, so remained off hydrochlorothiazide . . Lasix  as outlined.  Check metabolic panel.   Orders: -     Basic metabolic panel with GFR -     Ambulatory referral to Cardiology  Type 1 diabetes mellitus with hyperglycemia (HCC) Assessment & Plan: Having to adjust her insulin  as outlined. Sugars as outlined.  Check metabolic panel and A1c today. No changes at this time. Monitor sugars.   Orders: -     Basic metabolic panel with GFR -     Hemoglobin A1c -     Ambulatory referral to Cardiology  Hypercholesterolemia Assessment & Plan: On repatha .  Off zetia . Low cholesterol diet and exercise.  Follow lipid panel.  Check today.  Lab Results  Component Value Date   CHOL 129 04/27/2024   HDL 60 04/27/2024   LDLCALC 31 04/27/2024   LDLDIRECT 32 01/03/2024   TRIG 255 (H) 04/27/2024    CHOLHDL 2.2 04/27/2024     Orders: -     CBC with Differential/Platelet -     Hepatic function panel -     Lipid panel -     TSH  Edema, unspecified type Assessment & Plan: Increased pedal and lower extremity swelling. Some lower abdominal swelling. Weight gain recently. Has varied with recent hospitalization. Discussed diet. Discussed avoiding increased salt intake. Breathing overall stable. EKG - SR, non specific ST/T changes. No acute ischemic changes. Continue support hose and leg elevation. Start lasix  20mg . Will have her take daily over the next three days and call with update. Weigh daily. Check labs, including cbc, met b and liver panel. Discussed cardiology evaluation - with question of need for echo an further w/up. She is on amlodipine . (Not a new medication). Will monitor. May need to adjust dose down.   Orders: -     EKG 12-Lead -     Ambulatory referral to Cardiology  Anemia, unspecified type Assessment & Plan: Check cbc today.    Pain in both knees, unspecified chronicity Assessment & Plan: Has seen ortho. Request f/u with Bert Britain to discuss possible gel injections.    Hyponatremia Assessment & Plan: Extremely low sodium during recent hospitalization. Recent checks ok. Recheck today.    Anxiety Assessment & Plan: Continue zoloft . Notify if feels needs further intervention.    Weakness Assessment & Plan: Has improved.  Still with fatigue and above issues / limitations. Will remain out of work for now with plans to return at the end of June.    Other orders -     Furosemide ; Take 1 tablet (20 mg total) by mouth daily as needed.  Dispense: 30 tablet; Refill: 0   I spent 45 minutes with the patient.  Time spent discussing her current concerns and symptoms.  Specifically time spent discussing her weight gain, increased swelling and discussing her return to work plans. Time also spent discussing further w/up, evaluation and treatment.    Dellar Fenton,  MD

## 2024-04-28 ENCOUNTER — Telehealth: Payer: Self-pay

## 2024-04-28 LAB — CBC WITH DIFFERENTIAL/PLATELET
Basophils Absolute: 0.1 10*3/uL (ref 0.0–0.2)
Basos: 0 %
EOS (ABSOLUTE): 0.3 10*3/uL (ref 0.0–0.4)
Eos: 2 %
Hematocrit: 37 % (ref 34.0–46.6)
Hemoglobin: 12.2 g/dL (ref 11.1–15.9)
Immature Grans (Abs): 0.1 10*3/uL (ref 0.0–0.1)
Immature Granulocytes: 1 %
Lymphocytes Absolute: 2.3 10*3/uL (ref 0.7–3.1)
Lymphs: 18 %
MCH: 29.6 pg (ref 26.6–33.0)
MCHC: 33 g/dL (ref 31.5–35.7)
MCV: 90 fL (ref 79–97)
Monocytes Absolute: 0.8 10*3/uL (ref 0.1–0.9)
Monocytes: 7 %
Neutrophils Absolute: 9.4 10*3/uL — ABNORMAL HIGH (ref 1.4–7.0)
Neutrophils: 72 %
Platelets: 338 10*3/uL (ref 150–450)
RBC: 4.12 x10E6/uL (ref 3.77–5.28)
RDW: 13.5 % (ref 11.7–15.4)
WBC: 12.9 10*3/uL — ABNORMAL HIGH (ref 3.4–10.8)

## 2024-04-28 LAB — HEPATIC FUNCTION PANEL
ALT: 31 IU/L (ref 0–32)
AST: 30 IU/L (ref 0–40)
Albumin: 4.6 g/dL (ref 3.9–4.9)
Alkaline Phosphatase: 65 IU/L (ref 44–121)
Bilirubin Total: 0.3 mg/dL (ref 0.0–1.2)
Bilirubin, Direct: 0.12 mg/dL (ref 0.00–0.40)
Total Protein: 7.5 g/dL (ref 6.0–8.5)

## 2024-04-28 LAB — LIPID PANEL
Chol/HDL Ratio: 2.2 ratio (ref 0.0–4.4)
Cholesterol, Total: 129 mg/dL (ref 100–199)
HDL: 60 mg/dL (ref >39)
LDL Chol Calc (NIH): 31 mg/dL (ref 0–99)
Triglycerides: 255 mg/dL — ABNORMAL HIGH (ref 0–149)
VLDL Cholesterol Cal: 38 mg/dL (ref 5–40)

## 2024-04-28 LAB — BASIC METABOLIC PANEL WITH GFR
BUN/Creatinine Ratio: 16 (ref 12–28)
BUN: 16 mg/dL (ref 8–27)
CO2: 22 mmol/L (ref 20–29)
Calcium: 10.9 mg/dL — ABNORMAL HIGH (ref 8.7–10.3)
Chloride: 95 mmol/L — ABNORMAL LOW (ref 96–106)
Creatinine, Ser: 1.02 mg/dL — ABNORMAL HIGH (ref 0.57–1.00)
Glucose: 118 mg/dL — ABNORMAL HIGH (ref 70–99)
Potassium: 4.7 mmol/L (ref 3.5–5.2)
Sodium: 137 mmol/L (ref 134–144)
eGFR: 61 mL/min/{1.73_m2} (ref >59)

## 2024-04-28 LAB — HEMOGLOBIN A1C
Est. average glucose Bld gHb Est-mCnc: 166 mg/dL
Hgb A1c MFr Bld: 7.4 % — ABNORMAL HIGH (ref 4.8–5.6)

## 2024-04-28 LAB — TSH: TSH: 2.01 u[IU]/mL (ref 0.450–4.500)

## 2024-04-28 NOTE — Telephone Encounter (Signed)
 Pharmacy Patient Advocate Encounter   Received notification from Onbase that prior authorization for Repatha  Sureclick 140MG /ML is required/requested.   Insurance verification completed.   The patient is insured through [facility] .   Per test claim: PA required; PA submitted to above mentioned insurance via CoverMyMeds Key/confirmation #/EOC [id number]  Status is pending

## 2024-04-29 ENCOUNTER — Other Ambulatory Visit (HOSPITAL_COMMUNITY): Payer: Self-pay

## 2024-04-29 ENCOUNTER — Telehealth: Payer: Self-pay

## 2024-04-29 NOTE — Telephone Encounter (Signed)
 Pt is aware and gave a verbal understanding.

## 2024-04-29 NOTE — Telephone Encounter (Signed)
 Pharmacy Patient Advocate Encounter  Received notification from [employer] that Prior Authorization for Repatha  Sureclick 140MG /ML has been APPROVED from 05/21/24 to 11/30/2038   PA #/Case ID/Reference #: [id number]

## 2024-04-29 NOTE — Telephone Encounter (Signed)
 Copied from CRM [id number]. Topic: General - Other >> Apr 29, 2024  8:45 AM Jill Crosby wrote: Reason for CRM: Patient called to inform Dr. Geralyn Knee of her recent weights n relation to starting Lasix . Today's weight is 264, yesterday's was 266 , and Wednesday's weight was recorded during the office visit.

## 2024-04-29 NOTE — Telephone Encounter (Signed)
 Reviewed weights.  Decreasing. Please call her and see how she is feeling.  Any improvement in symptoms?

## 2024-04-29 NOTE — Telephone Encounter (Signed)
 Spoke with Jill Crosby and she stated that she is feeling better and her stomach feels much better. Jill Crosby was advised per a verbal from Dr. Geralyn Knee that since her weight is decreasing she should hold the lasix  for the weekend and give us  a call back on Monday. I advised Jill Crosby to continue checking her weight daily. Jill Crosby gave a verbal understanding.

## 2024-05-01 ENCOUNTER — Ambulatory Visit: Payer: Self-pay | Admitting: Internal Medicine

## 2024-05-01 ENCOUNTER — Encounter: Payer: Self-pay | Admitting: Internal Medicine

## 2024-05-01 DIAGNOSIS — R944 Abnormal results of kidney function studies: Secondary | ICD-10-CM

## 2024-05-01 NOTE — Assessment & Plan Note (Signed)
 Having to adjust her insulin  as outlined. Sugars as outlined.  Check metabolic panel and A1c today. No changes at this time. Monitor sugars.

## 2024-05-01 NOTE — Telephone Encounter (Signed)
 See result note with recommendation to call pt for update.

## 2024-05-01 NOTE — Assessment & Plan Note (Signed)
 On repatha .  Off zetia . Low cholesterol diet and exercise.  Follow lipid panel.  Check today.  Lab Results  Component Value Date   CHOL 129 04/27/2024   HDL 60 04/27/2024   LDLCALC 31 04/27/2024   LDLDIRECT 32 01/03/2024   TRIG 255 (H) 04/27/2024   CHOLHDL 2.2 04/27/2024

## 2024-05-01 NOTE — Assessment & Plan Note (Signed)
 Continue zoloft . Notify if feels needs further intervention.

## 2024-05-01 NOTE — Assessment & Plan Note (Signed)
 Has been amlodipine  and lisinopril . Has been off hydrochlorothiazide , given recent admission for extremely low sodium.  Blood pressure has been ok, so remained off hydrochlorothiazide . . Lasix  as outlined.  Check metabolic panel.

## 2024-05-01 NOTE — Assessment & Plan Note (Signed)
 Has improved.  Still with fatigue and above issues / limitations. Will remain out of work for now with plans to return at the end of June.

## 2024-05-01 NOTE — Assessment & Plan Note (Signed)
Check cbc today 

## 2024-05-01 NOTE — Assessment & Plan Note (Signed)
 Has seen ortho. Request f/u with Bert Britain to discuss possible gel injections.

## 2024-05-01 NOTE — Assessment & Plan Note (Addendum)
 Increased pedal and lower extremity swelling. Some lower abdominal swelling. Weight gain recently. Has varied with recent hospitalization. Discussed diet. Discussed avoiding increased salt intake. Breathing overall stable. EKG - SR, non specific ST/T changes. No acute ischemic changes. Continue support hose and leg elevation. Start lasix  20mg . Will have her take daily over the next three days and call with update. Weigh daily. Check labs, including cbc, met b and liver panel. Discussed cardiology evaluation - with question of need for echo an further w/up. She is on amlodipine . (Not a new medication). Will monitor. May need to adjust dose down.

## 2024-05-01 NOTE — Assessment & Plan Note (Signed)
 Extremely low sodium during recent hospitalization. Recent checks ok. Recheck today.

## 2024-05-02 ENCOUNTER — Telehealth: Payer: Self-pay

## 2024-05-02 NOTE — Telephone Encounter (Signed)
 Copied from CRM [id number]. Topic: General - Other >> Apr 29, 2024  8:45 AM Caliyah H wrote: Reason for CRM: Patient called to inform Dr. Geralyn Knee of her recent weights n relation to starting Lasix . Today's weight is 264, yesterday's was 266 , and Wednesday's weight was recorded during the office visit. >> May 02, 2024  9:21 AM Howard Macho wrote: Patient called stating the doctor wanted her to record her weight. Patient called stating she discontinued the laxis over the weekend  Saturday-263, Sunday-265 and today-267

## 2024-05-02 NOTE — Telephone Encounter (Signed)
 See result note.

## 2024-05-04 ENCOUNTER — Telehealth: Payer: Self-pay | Admitting: Internal Medicine

## 2024-05-04 ENCOUNTER — Other Ambulatory Visit

## 2024-05-04 NOTE — Telephone Encounter (Signed)
 Please call Jill Crosby and let her know that I scheduled her an appt with Bert Britain - 05/13/24 in [facility] for her knees.

## 2024-05-06 ENCOUNTER — Other Ambulatory Visit (INDEPENDENT_AMBULATORY_CARE_PROVIDER_SITE_OTHER)

## 2024-05-06 DIAGNOSIS — R944 Abnormal results of kidney function studies: Secondary | ICD-10-CM | POA: Diagnosis not present

## 2024-05-06 NOTE — Telephone Encounter (Signed)
 Patient is aware

## 2024-05-07 ENCOUNTER — Ambulatory Visit: Payer: Self-pay | Admitting: Internal Medicine

## 2024-05-07 LAB — BASIC METABOLIC PANEL WITH GFR
BUN/Creatinine Ratio: 15 (ref 12–28)
BUN: 15 mg/dL (ref 8–27)
CO2: 23 mmol/L (ref 20–29)
Calcium: 9.7 mg/dL (ref 8.7–10.3)
Chloride: 98 mmol/L (ref 96–106)
Creatinine, Ser: 0.98 mg/dL (ref 0.57–1.00)
Glucose: 92 mg/dL (ref 70–99)
Potassium: 4.4 mmol/L (ref 3.5–5.2)
Sodium: 140 mmol/L (ref 134–144)
eGFR: 64 mL/min/{1.73_m2} (ref >59)

## 2024-05-13 ENCOUNTER — Other Ambulatory Visit: Payer: Self-pay | Admitting: Internal Medicine

## 2024-05-16 ENCOUNTER — Telehealth: Admitting: Internal Medicine

## 2024-05-16 VITALS — Ht 64.0 in | Wt 260.0 lb

## 2024-05-16 DIAGNOSIS — E871 Hypo-osmolality and hyponatremia: Secondary | ICD-10-CM

## 2024-05-16 DIAGNOSIS — E7800 Pure hypercholesterolemia, unspecified: Secondary | ICD-10-CM

## 2024-05-16 DIAGNOSIS — I10 Essential (primary) hypertension: Secondary | ICD-10-CM | POA: Diagnosis not present

## 2024-05-16 DIAGNOSIS — R531 Weakness: Secondary | ICD-10-CM

## 2024-05-16 DIAGNOSIS — R609 Edema, unspecified: Secondary | ICD-10-CM

## 2024-05-16 DIAGNOSIS — F419 Anxiety disorder, unspecified: Secondary | ICD-10-CM | POA: Diagnosis not present

## 2024-05-16 DIAGNOSIS — E1065 Type 1 diabetes mellitus with hyperglycemia: Secondary | ICD-10-CM

## 2024-05-16 NOTE — Progress Notes (Signed)
 Patient ID: Jill Crosby, female   DOB: [birth year], 63 y.o.   MRN: [id number]   Virtual Visit via video Note  I connected with Jill Crosby today by a video enabled telemedicine application and verified that I am speaking with the correct person using two identifiers. Location patient: home Location provider: work  Persons participating in the virtual visit: patient, provider  The limitations, risks, security and privacy concerns of performing an evaluation and management service by video and the availability of in person appointments have been discussed. It has also been discussed with the patient that there may be a patient responsible charge related to this service. The patient expressed understanding and agreed to proceed.   Reason for visit: work in appt.   HPI: Work in to discuss return to work date. Was seen 04/27/24 - concerned regarding weight gain and increased lower extremity swelling. Started on lasix . Planning cardiac evaluation this week. Reports weight has leveled off at 260 pounds. Does not feel as swollen in her abdomen. Breathing stable. No chest pain reported. No vomiting or bowel change reported. Saw Krystal Doyne - f/u knee pain. Recommended voltaren gel prn. Planning gel injections. With persistent knee pain. Discussed return to work. Discussed driving. Previous brain fog has resolved. Overall doing better. Knee limiting activity.   ROS: See pertinent positives and negatives per HPI.  Past Medical History:  Diagnosis Date   Anxiety    Depression    Elevated BP    Hypercholesterolemia    Increased BMI    Menopause    Morbid obesity (HCC)    Type I diabetes mellitus (HCC)     Past Surgical History:  Procedure Laterality Date   BREAST BIOPSY Left 05/09/2020   ribbon clip, stereo bx, HEMANGIOMA, BENIGN. - MAMMARY EPITHELIUM IS NOT PRESENT.   CATARACT EXTRACTION W/PHACO Left 11/08/2020   Procedure: CATARACT EXTRACTION PHACO AND INTRAOCULAR LENS PLACEMENT  (IOC) LEFT DIABETIC 4.14 00:52.1 ;  Surgeon: Jaye Fallow, MD;  Location: [facility];  Service: Ophthalmology;  Laterality: Left;   CATARACT EXTRACTION W/PHACO Right 12/04/2020   Procedure: CATARACT EXTRACTION PHACO AND INTRAOCULAR LENS PLACEMENT (IOC) RIGHT DIABETIC 6.11 00:46.1;  Surgeon: Jaye Fallow, MD;  Location: [facility];  Service: Ophthalmology;  Laterality: Right;  Diabetic - insulin    CESAREAN SECTION  1997   COLONOSCOPY WITH PROPOFOL  N/A 01/18/2021   Procedure: COLONOSCOPY WITH PROPOFOL ;  Surgeon: Jinny Carmine, MD;  Location: [facility];  Service: Endoscopy;  Laterality: N/A;  priority 4    Family History  Problem Relation Age of Onset   Hypertension Mother    Hyperlipidemia Mother    Colon polyps Mother    Cancer Father        lung   Colon cancer Maternal Grandfather    Diabetes Maternal Grandfather    Breast cancer Maternal Grandmother    Colon cancer Paternal Grandfather    Breast cancer Maternal Aunt    Heart disease Neg Hx    Ovarian cancer Neg Hx     SOCIAL HX: reviewed.    Current Outpatient Medications:    amLODipine  (NORVASC ) 10 MG tablet, TAKE 1 TABLET BY MOUTH DAILY, Disp: 90 tablet, Rfl: 3   BAYER MICROLET LANCETS lancets, USE AS DIRECTED TO CHECK  SUGARS 1 TO 2 TIMES A DAY, Disp: 300 each, Rfl: 2   cholecalciferol (VITAMIN D3) 25 MCG (1000 UT) tablet, Take 1,000 Units by mouth daily., Disp: , Rfl:    CONTOUR NEXT TEST test strip, USE TO  CHECK BLOOD GLUCOSE 6 TO  7 TIMES DAILY, Disp: 700 strip, Rfl: 3   Cyanocobalamin  (B-12 PO), Take by mouth., Disp: , Rfl:    furosemide  (LASIX ) 40 MG tablet, Take 1 tablet (40 mg total) by mouth daily., Disp: 90 tablet, Rfl: 3   insulin  lispro (HUMALOG  KWIKPEN) 100 UNIT/ML KwikPen, INJECT SUBCUTANEOUSLY 65  UNITS DAILY.  (Takes distributed over three meals counting carbs), Disp: 45 mL, Rfl: 3   Insulin  Pen Needle 32G X 4 MM MISC, Use as directed with insulin  4 x daily., Disp: 200 each,  Rfl: 3   LANTUS  SOLOSTAR 100 UNIT/ML Solostar Pen, INJECT SUBCUTANEOUSLY 80 UNITS  AT NIGHT, Disp: 75 mL, Rfl: 3   levocetirizine (XYZAL ) 5 MG tablet, TAKE 1 TABLET BY MOUTH IN THE  EVENING, Disp: 90 tablet, Rfl: 3   lisinopril  (ZESTRIL ) 40 MG tablet, Take 1 tablet (40 mg total) by mouth daily., Disp: 90 tablet, Rfl: 3   REPATHA  SURECLICK 140 MG/ML SOAJ, INJECT 1 PEN SUBCUTANEOUSLY  EVERY 2 WEEKS, Disp: 6 mL, Rfl: 3   sertraline  (ZOLOFT ) 50 MG tablet, TAKE 1 TABLET BY MOUTH DAILY, Disp: 90 tablet, Rfl: 3  EXAM:  GENERAL: alert, oriented, appears well and in no acute distress  HEENT: atraumatic, conjunttiva clear, no obvious abnormalities on inspection of external nose and ears  NECK: normal movements of the head and neck  LUNGS: on inspection no signs of respiratory distress, breathing rate appears normal, no obvious gross SOB, gasping or wheezing  CV: no obvious cyanosis  PSYCH/NEURO: pleasant and cooperative, no obvious depression or anxiety, speech and thought processing grossly intact  ASSESSMENT AND PLAN:  Discussed the following assessment and plan:  Problem List Items Addressed This Visit     Anxiety   Continue zoloft .  Overall appears to be handling things relatively well.       Edema   Last visit, discussed increased pedal and lower extremity swelling. Also some lower abdominal swelling. Weight gain recently. Has varied with recent hospitalization. Discussed diet. Discussed avoiding increased salt intake. Breathing overall stable. Previous EKG - SR, non specific ST/T changes. No acute ischemic changes. Advised to continue support hose and leg elevation. Started lasix  20mg . Taking daily. Does report some improvement. Will continue lasix  20mg  q day for now. Discussed cardiology evaluation - with question of need for echo an further w/up. She is on amlodipine . (Not a new medication). Will monitor. May need to adjust dose down. Keep cardiology appt this week.       Essential  hypertension - Primary   Has been amlodipine  and lisinopril . Has been off hydrochlorothiazide , given recent admission for extremely low sodium.  Blood pressure has been ok, so remained off hydrochlorothiazide . .taking lasix  as outlined. Recent GFR stable. Does feel some better regarding her swelling. Has appt with cardiology this week to determine if further cardiac w/up warranted. Discussed.        Hypercholesterolemia   On repatha .  Off zetia . Low cholesterol diet and exercise.  Follow lipid panel.  Lab Results  Component Value Date   CHOL 129 04/27/2024   HDL 60 04/27/2024   LDLCALC 31 04/27/2024   LDLDIRECT 32 01/03/2024   TRIG 255 (H) 04/27/2024   CHOLHDL 2.2 04/27/2024         Hyponatremia   Extremely low sodium during hospitalization. Recent sodium wnl. Continue lasix .       Type 1 diabetes mellitus with hyperglycemia (HCC)   Has had to adjust her insulin  as outlined previously. Continue  low carb diet and exercise. Follow met b and A1c.       Weakness   Plan for return to work by the end of the month. Form has been completed. Letter sent.        Return for keep scheduled. .   I discussed the assessment and treatment plan with the patient. The patient was provided an opportunity to ask questions and all were answered. The patient agreed with the plan and demonstrated an understanding of the instructions.   The patient was advised to call back or seek an in-person evaluation if the symptoms worsen or if the condition fails to improve as anticipated.    Allena Hamilton, MD

## 2024-05-18 ENCOUNTER — Encounter: Payer: Self-pay | Admitting: Internal Medicine

## 2024-05-18 ENCOUNTER — Ambulatory Visit: Attending: Internal Medicine | Admitting: Internal Medicine

## 2024-05-18 VITALS — BP 150/60 | HR 90 | Ht 64.0 in | Wt 274.2 lb

## 2024-05-18 DIAGNOSIS — I10 Essential (primary) hypertension: Secondary | ICD-10-CM | POA: Diagnosis not present

## 2024-05-18 DIAGNOSIS — R600 Localized edema: Secondary | ICD-10-CM | POA: Insufficient documentation

## 2024-05-18 DIAGNOSIS — I5032 Chronic diastolic (congestive) heart failure: Secondary | ICD-10-CM | POA: Diagnosis not present

## 2024-05-18 MED ORDER — FUROSEMIDE 40 MG PO TABS: 40.0000 mg | ORAL_TABLET | Freq: Every day | ORAL | 3 refills | Status: DC

## 2024-05-18 MED ORDER — FUROSEMIDE 40 MG PO TABS: 40.0000 mg | ORAL_TABLET | Freq: Every day | ORAL | 0 refills | Status: DC

## 2024-05-18 NOTE — Patient Instructions (Signed)
 Medication Instructions:  Your physician recommends the following medication changes.  INCREASE: Lasix  to 40 mg by mouth daily   *If you need a refill on your cardiac medications before your next appointment, please call your pharmacy*  Lab Work: No labs ordered today .  Testing/Procedures: No test ordered today   Follow-Up: At [facility], you and your health needs are our priority.  As part of our continuing mission to provide you with exceptional heart care, our providers are all part of one team.  This team includes your primary Cardiologist (physician) and Advanced Practice Providers or APPs (Physician Assistants and Nurse Practitioners) who all work together to provide you with the care you need, when you need it.  Your next appointment:   1 month(s)  Provider:   You may see Sammy Crisp, MD or one of the following Advanced Practice Providers on your designated Care Team:   Laneta Pintos, NP Gildardo Labrador, PA-C Varney Gentleman, PA-C Cadence [place], PA-C Ronald Cockayne, NP Morey Ar, NP

## 2024-05-18 NOTE — Progress Notes (Signed)
 Cardiology Office Note:  .   Date:  05/18/2024  ID:  Jill Crosby, DOB 1961-08-01, MRN [id number] PCP: Jill Fenton, MD  [facility] Providers Cardiologist:  Sammy Crisp, MD     History of Present Illness: .   Jill Crosby is a 63 y.o. female hypertension, hyperlipidemia, type 1 diabetes mellitus, and morbid obesity, who has been referred by Dr. Geralyn Crosby for urgent evaluation of progressive weight gain and swelling.  She had an echocardiogram in February of this year during a hospitalization for altered mental status in the setting of severe hyponatremia and influenza infection, which showed normal LVEF with grade 2 diastolic dysfunction and mild mitral regurgitation.  Since her hospitalization in February, Jill Crosby has noted some increased leg swelling.  In particular over the last 6 weeks, she has put on 17 pounds.  She began using compression stockings with modest improvement.  She was also prescribed furosemide  20 mg daily as needed by Dr. Geralyn Crosby earlier this month, which she has been using on a regular basis.  She feels like abdominal bloating has improved with this though her weight has not gone down much.  She denies chest pain, shortness of breath, palpitations, and lightheadedness.  Home blood pressures are typically a bit better, most recently 130/68.  She notes that she was on hydrochlorothiazide  leading up to her hospitalization in February, though this has not been restarted given concern that it may have contributed to her severe hyponatremia.  Of note, she had also had a severe GI illness shortly before then and was only drinking water .  ROS: See HPI  Studies Reviewed: Jill Crosby   EKG Interpretation Date/Time:  Wednesday May 18 2024 10:50:11 EDT Ventricular Rate:  90 PR Interval:  114 QRS Duration:  80 QT Interval:  366 QTC Calculation: 447 R Axis:   69  Text Interpretation: Normal sinus rhythm Normal ECG When compared with ECG of 03-Jan-2024 14:45, QRS axis has  changed Confirmed by Jill Crosby [phone]) on 05/18/2024 11:07:13 AM    TTE (01/05/2024): Mildly dilated left ventricle with mild LVH.  LVEF 55-6% with grade 2 diastolic dysfunction.  Normal RV size and function.  Mild left atrial enlargement.  No pericardial effusion.  Grossly normal mitral valve with mild regurgitation.  Aortic sclerosis without stenosis.  Risk Assessment/Calculations:     HYPERTENSION CONTROL Vitals:   05/18/24 1044 05/18/24 1119  BP: (!) 144/62 (!) 150/60    The patient's blood pressure is elevated above target today.  In order to address the patient's elevated BP: A current anti-hypertensive medication was adjusted today.; Blood pressure will be monitored at home to determine if medication changes need to be made.          Physical Exam:   VS:  BP (!) 150/60 (Cuff Size: Large)   Pulse 90   Ht 5' 4 (1.626 m)   Wt 274 lb 3.2 oz (124.4 kg)   SpO2 97%   BMI 47.07 kg/m    Wt Readings from Last 3 Encounters:  05/18/24 274 lb 3.2 oz (124.4 kg)  05/16/24 260 lb (117.9 kg)  04/27/24 269 lb 3.2 oz (122.1 kg)    General:  NAD. Neck: Unable to assess JVP due to body habitus. Lungs: Clear to auscultation bilaterally without wheezes or crackles. Heart: Regular rate and rhythm without murmurs, rubs, or gallops. Abdomen: Obese and firm. Extremities: 2+ chronic appearing edema in both calves.  ASSESSMENT AND PLAN: .    Chronic HFpEF and lower extremity edema:  Lower extremity edema is likely multifactorial though a component of HFpEF could be contributing given diastolic dysfunction noted on echocardiogram in 01/2023.  We have discussed importance of fluid and sodium restriction.  We have agreed to increase furosemide  to 40 mg daily with plans to repeat a BMP when she returns in a month.  Of note, sodium was normal on 05/06/2024 after furosemide  was started.  Given multiple cardiac risk factors, may need to consider ischemia evaluation down the road once euvolemic status  has been achieved.  If we struggle to diurese Ms. Jill Crosby, right +/- left heart catheterization to better understand her hemodynamics may be helpful.  Continued pression stocking use encouraged.  Hypertension: Blood pressure modestly elevated today.  Home blood pressures have been a bit better.  As above, we will increase furosemide  to 40 mg daily, which may help a little bit with bringing down her blood pressure.  We will continue her current regimen of amlodipine  and lisinopril , though in the future we may try to de-escalate/stop amlodipine  if her blood pressure allows, as this could be contributing to some of her fluid retention.  Morbid obesity: BMI greater than 45 with multiple comorbidities.  Weight loss encouraged through diet and exercise.    Dispo: Return to clinic in 1 month.  Signed, Sammy Crisp, MD

## 2024-05-20 ENCOUNTER — Encounter: Payer: Self-pay | Admitting: Internal Medicine

## 2024-05-20 NOTE — Assessment & Plan Note (Signed)
 Last visit, discussed increased pedal and lower extremity swelling. Also some lower abdominal swelling. Weight gain recently. Has varied with recent hospitalization. Discussed diet. Discussed avoiding increased salt intake. Breathing overall stable. Previous EKG - SR, non specific ST/T changes. No acute ischemic changes. Advised to continue support hose and leg elevation. Started lasix  20mg . Taking daily. Does report some improvement. Will continue lasix  20mg  q day for now. Discussed cardiology evaluation - with question of need for echo an further w/up. She is on amlodipine . (Not a new medication). Will monitor. May need to adjust dose down. Keep cardiology appt this week.

## 2024-05-20 NOTE — Assessment & Plan Note (Signed)
 Has had to adjust her insulin  as outlined previously. Continue low carb diet and exercise. Follow met b and A1c.

## 2024-05-20 NOTE — Assessment & Plan Note (Signed)
 On repatha .  Off zetia . Low cholesterol diet and exercise.  Follow lipid panel.  Lab Results  Component Value Date   CHOL 129 04/27/2024   HDL 60 04/27/2024   LDLCALC 31 04/27/2024   LDLDIRECT 32 01/03/2024   TRIG 255 (H) 04/27/2024   CHOLHDL 2.2 04/27/2024

## 2024-05-20 NOTE — Assessment & Plan Note (Signed)
 Has been amlodipine  and lisinopril . Has been off hydrochlorothiazide , given recent admission for extremely low sodium.  Blood pressure has been ok, so remained off hydrochlorothiazide . .taking lasix  as outlined. Recent GFR stable. Does feel some better regarding her swelling. Has appt with cardiology this week to determine if further cardiac w/up warranted. Discussed.

## 2024-05-20 NOTE — Assessment & Plan Note (Signed)
 Extremely low sodium during hospitalization. Recent sodium wnl. Continue lasix .

## 2024-05-20 NOTE — Assessment & Plan Note (Signed)
 Plan for return to work by the end of the month. Form has been completed. Letter sent.

## 2024-05-20 NOTE — Assessment & Plan Note (Signed)
 Continue zoloft .  Overall appears to be handling things relatively well.

## 2024-06-21 NOTE — Progress Notes (Unsigned)
 Cardiology Office Note    Date:  06/22/2024   ID:  Jill Crosby, DOB [birth year], MRN [id number]  PCP:  Glendia Shad, MD  Cardiologist:  [name], MD  Electrophysiologist:  None   Chief Complaint: Follow up  History of Present Illness:   Jill Crosby is a 63 y.o. female with history of type 1 diabetes, HTN, HLD, and morbid obesity who presents for follow-up of echo.  She was evaluated as a new patient by Dr. Hanson on 05/18/2024 for evaluation of progressive weight gain and swelling.  She had previously undergone an echo in 01/2024 during a hospital admission for altered mental status in the setting of severe hyponatremia and influenza that showed an EF of 55 to 60%, mildly dilated LV internal cavity size, mild LVH, grade 2 diastolic dysfunction, normal RV systolic function and ventricular cavity size, mildly dilated left atrium, mild mitral regurgitation, and a tricuspid aortic valve.  At the time of her visit in 05/2024, she reported a 17 pound weight gain over the preceding 6 weeks and was using compression stockings with modest improvement.  She had also been started on furosemide  20 mg daily as needed earlier that month that she was taking on a regular basis.  She reported having previously been on hydrochlorothiazide  leading up to her admission in 01/2024, though this had not been restarted given concern that it may have contributed to her significant hyponatremia (of note she also had a severe GI illness shortly before her admission and was only drinking water ).  It was felt her lower extremity edema was likely multifactorial with a degree of HFpEF possibly contributing given diastolic dysfunction noted on echo in 01/2024.  Furosemide  was titrated to 40 mg daily with recommendation to restrict excess fluid and sodium.  She comes in today and is without symptoms of angina, dyspnea, palpitations, dizziness, presyncope, or syncope.  Her weight is up another 13 pounds today when  compared to her visit in 05/2024.  She continues to note lower extremity swelling that is typically improved early in the morning and more progressive throughout the day.  She notes good urine output with furosemide  for 2 to 3 hours.  She also notes the swelling became more pronounced upon resuming work (works from home with her feet hanging down from a chair most of the day).  She does wear compression socks.  Trying to monitor her sodium intake.  She also wonders if her lower extremity swelling is more pronounced in the setting of significant arthritis involving both knees for which she is being evaluated by orthopedics and requiring injections.  No progressive orthopnea.   Labs independently reviewed: 05/2024 - BUN 15, serum creatinine 0.98, potassium 4.4 03/2024 - TSH normal, TC 129, TG 255, HDL 60, LDL 31, albumin 4.6, AST/ALT normal, A1c 7.4, Hgb 12.2, PLT 338  Past Medical History:  Diagnosis Date   Anxiety    Depression    Elevated BP    Hypercholesterolemia    Increased BMI    Menopause    Morbid obesity (HCC)    Type I diabetes mellitus (HCC)     Past Surgical History:  Procedure Laterality Date   BREAST BIOPSY Left 05/09/2020   ribbon clip, stereo bx, HEMANGIOMA, BENIGN. - MAMMARY EPITHELIUM IS NOT PRESENT.   CATARACT EXTRACTION W/PHACO Left 11/08/2020   Procedure: CATARACT EXTRACTION PHACO AND INTRAOCULAR LENS PLACEMENT (IOC) LEFT DIABETIC 4.14 00:52.1 ;  Surgeon: Jaye Fallow, MD;  Location: [facility];  Service: Ophthalmology;  Laterality: Left;   CATARACT EXTRACTION W/PHACO Right 12/04/2020   Procedure: CATARACT EXTRACTION PHACO AND INTRAOCULAR LENS PLACEMENT (IOC) RIGHT DIABETIC 6.11 00:46.1;  Surgeon: Jaye Fallow, MD;  Location: [facility];  Service: Ophthalmology;  Laterality: Right;  Diabetic - insulin    CESAREAN SECTION  1997   COLONOSCOPY WITH PROPOFOL  N/A 01/18/2021   Procedure: COLONOSCOPY WITH PROPOFOL ;  Surgeon: Jinny Carmine, MD;   Location: [facility];  Service: Endoscopy;  Laterality: N/A;  priority 4    Current Medications: Current Meds  Medication Sig   BAYER MICROLET LANCETS lancets USE AS DIRECTED TO CHECK  SUGARS 1 TO 2 TIMES A DAY   cholecalciferol (VITAMIN D3) 25 MCG (1000 UT) tablet Take 1,000 Units by mouth daily.   CONTOUR NEXT TEST test strip USE TO CHECK BLOOD GLUCOSE 6 TO  7 TIMES DAILY   Cyanocobalamin  (B-12 PO) Take by mouth.   furosemide  (LASIX ) 40 MG tablet Take 1 tablet (40 mg total) by mouth daily.   insulin  lispro (HUMALOG  KWIKPEN) 100 UNIT/ML KwikPen INJECT SUBCUTANEOUSLY 65  UNITS DAILY.  (Takes distributed over three meals counting carbs)   Insulin  Pen Needle 32G X 4 MM MISC Use as directed with insulin  4 x daily.   LANTUS  SOLOSTAR 100 UNIT/ML Solostar Pen INJECT SUBCUTANEOUSLY 80 UNITS  AT NIGHT   levocetirizine (XYZAL ) 5 MG tablet TAKE 1 TABLET BY MOUTH IN THE  EVENING   lisinopril  (ZESTRIL ) 40 MG tablet Take 1 tablet (40 mg total) by mouth daily.   REPATHA  SURECLICK 140 MG/ML SOAJ INJECT 1 PEN SUBCUTANEOUSLY  EVERY 2 WEEKS   sertraline  (ZOLOFT ) 50 MG tablet TAKE 1 TABLET BY MOUTH DAILY   [DISCONTINUED] amLODipine  (NORVASC ) 10 MG tablet TAKE 1 TABLET BY MOUTH DAILY   [DISCONTINUED] carvedilol  (COREG ) 6.25 MG tablet Take 1 tablet (6.25 mg total) by mouth 2 (two) times daily.    Allergies:   Codeine   Social History   Socioeconomic History   Marital status: Single    Spouse name: Not on file   Number of children: Not on file   Years of education: Not on file   Highest education level: 12th grade  Occupational History   Not on file  Tobacco Use   Smoking status: Former    Current packs/day: 0.50    Average packs/day: 0.5 packs/day for 20.0 years (10.0 ttl pk-yrs)    Types: Cigarettes   Smokeless tobacco: Never  Substance and Sexual Activity   Alcohol use: No    Alcohol/week: 0.0 standard drinks of alcohol   Drug use: No   Sexual activity: Not Currently    Birth  control/protection: Post-menopausal  Other Topics Concern   Not on file  Social History Narrative   Not on file   Social Drivers of Health   Financial Resource Strain: Low Risk  (06/08/2024)   Received from [facility]   Overall Financial Resource Strain (CARDIA)    Difficulty of Paying Living Expenses: Not hard at all  Food Insecurity: No Food Insecurity (06/08/2024)   Received from [facility]   Hunger Vital Sign    Within the past 12 months, you worried that your food would run out before you got the money to buy more.: Never true    Within the past 12 months, the food you bought just didn't last and you didn't have money to get more.: Never true  Transportation Needs: No Transportation Needs (06/08/2024)   Received from [facility]  PRAPARE - Transportation    In the past 12 months, has lack of transportation kept you from medical appointments or from getting medications?: No    Lack of Transportation (Non-Medical): No  Physical Activity: Inactive (05/12/2024)   Exercise Vital Sign    Days of Exercise per Week: 0 days    Minutes of Exercise per Session: Not on file  Stress: Stress Concern Present (05/12/2024)   [employer] of Occupational Health - Occupational Stress Questionnaire    Feeling of Stress: To some extent  Social Connections: Moderately Integrated (05/12/2024)   Social Connection and Isolation Panel    Frequency of Communication with Friends and Family: More than three times a week    Frequency of Social Gatherings with Friends and Family: Once a week    Attends Religious Services: More than 4 times per year    Active Member of [employer] or Organizations: Yes    Attends Engineer, structural: More than 4 times per year    Marital Status: Divorced     Family History:  The patient's family history includes Breast cancer in her maternal aunt and maternal grandmother; Cancer in her father; Colon cancer in her  maternal grandfather and paternal grandfather; Colon polyps in her mother; Diabetes in her maternal grandfather; Hyperlipidemia in her mother; Hypertension in her mother. There is no history of Heart disease or Ovarian cancer.  ROS:   12-point review of systems is negative unless otherwise noted in the HPI.   EKGs/Labs/Other Studies Reviewed:    Studies reviewed were summarized above. The additional studies were reviewed today:  2D echo 01/05/2024: 1. Left ventricular ejection fraction, by estimation, is 55 to 60%. The  left ventricle has normal function. Left ventricular endocardial border  not optimally defined to evaluate regional wall motion. The left  ventricular internal cavity size was mildly  dilated. There is mild left ventricular hypertrophy. Left ventricular  diastolic parameters are consistent with Grade II diastolic dysfunction  (pseudonormalization).   2. Right ventricular systolic function is normal. The right ventricular  size is normal.   3. Left atrial size was mildly dilated.   4. The mitral valve is grossly normal. Mild mitral valve regurgitation.  No evidence of mitral stenosis.   5. The aortic valve is tricuspid. Aortic valve regurgitation is not  visualized. No aortic stenosis is present.    EKG:  EKG is not ordered today.    Recent Labs: 01/03/2024: B Natriuretic Peptide 239.2 01/10/2024: Magnesium  2.3 04/27/2024: ALT 31; Hemoglobin 12.2; Platelets 338; TSH 2.010 05/06/2024: BUN 15; Creatinine, Ser 0.98; Potassium 4.4; Sodium 140  Recent Lipid Panel    Component Value Date/Time   CHOL 129 04/27/2024 1226   TRIG 255 (H) 04/27/2024 1226   HDL 60 04/27/2024 1226   CHOLHDL 2.2 04/27/2024 1226   CHOLHDL 2.1 01/03/2024 2036   VLDL 17 01/03/2024 2036   LDLCALC 31 04/27/2024 1226   LDLDIRECT 32 01/03/2024 2036    PHYSICAL EXAM:    VS:  BP (!) 150/60 (BP Location: Left Arm, Patient Position: Sitting, Cuff Size: Normal)   Pulse 88   Ht 5' 4 (1.626 m)   Wt 287  lb (130.2 kg)   SpO2 97%   BMI 49.26 kg/m   BMI: Body mass index is 49.26 kg/m.  Physical Exam Vitals reviewed.  Constitutional:      Appearance: She is well-developed.  HENT:     Head: Normocephalic and atraumatic.  Eyes:     General:  Right eye: No discharge.        Left eye: No discharge.  Neck:     Comments: JVD difficult to assess secondary to body habitus. Cardiovascular:     Rate and Rhythm: Normal rate and regular rhythm.     Heart sounds: Normal heart sounds, S1 normal and S2 normal. Heart sounds not distant. No midsystolic click and no opening snap. No murmur heard.    No friction rub.  Pulmonary:     Effort: Pulmonary effort is normal. No respiratory distress.     Breath sounds: Normal breath sounds. No decreased breath sounds, wheezing, rhonchi or rales.  Chest:     Chest wall: No tenderness.  Musculoskeletal:     Cervical back: Normal range of motion.     Right lower leg: Edema present.     Left lower leg: Edema present.     Comments: 2+ chronic appearing woody edema to the knees bilaterally.  Skin:    General: Skin is warm and dry.     Nails: There is no clubbing.  Neurological:     Mental Status: She is alert and oriented to person, place, and time.  Psychiatric:        Speech: Speech normal.        Behavior: Behavior normal.        Thought Content: Thought content normal.        Judgment: Judgment normal.     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  We will decrease amlodipine  to 5 mg daily with plans to discontinue at next visit.  Check BMP, if renal function and electrolytes are stable  anticipate titrating furosemide .  Recommend leg elevation and compression socks.  Given multiple cardiac risk factors, if she continues to appear volume up and follow-up, or if there is limitation regarding diuresis in the setting of laboratory abnormalities, we may need to consider R/LHC to better understand her hemodynamics and assist with pharmacotherapy.  HTN: Blood pressure is mildly elevated in the office today.  As outlined above, we will decrease amlodipine  to 5 mg daily with plans to ultimately discontinue this medication at next visit.  To compensate for this we will add carvedilol  6.25 mg twice daily with recommendation to titrate as needed along with continuation of lisinopril  40 mg daily.  Low-sodium diet is recommended.    Disposition: F/u with Dr. Mady or an APP in 1 week.   Medication Adjustments/Labs and Tests Ordered: Current medicines are reviewed at length with the patient today.  Concerns regarding medicines are outlined above. Medication changes, Labs and Tests ordered today are summarized above and listed in the Patient Instructions accessible in Encounters.   Signed, Bernardino Bring, PA-C 06/22/2024 1:21 PM     [facility] [place] [address] [place], [place] [address] [phone]

## 2024-06-22 ENCOUNTER — Encounter: Payer: Self-pay | Admitting: Physician Assistant

## 2024-06-22 ENCOUNTER — Ambulatory Visit: Attending: Physician Assistant | Admitting: Physician Assistant

## 2024-06-22 VITALS — BP 150/60 | HR 88 | Ht 64.0 in | Wt 287.0 lb

## 2024-06-22 DIAGNOSIS — R600 Localized edema: Secondary | ICD-10-CM

## 2024-06-22 DIAGNOSIS — I5032 Chronic diastolic (congestive) heart failure: Secondary | ICD-10-CM

## 2024-06-22 DIAGNOSIS — Z79899 Other long term (current) drug therapy: Secondary | ICD-10-CM

## 2024-06-22 DIAGNOSIS — I10 Essential (primary) hypertension: Secondary | ICD-10-CM | POA: Diagnosis not present

## 2024-06-22 MED ORDER — AMLODIPINE BESYLATE 5 MG PO TABS: 5.0000 mg | ORAL_TABLET | Freq: Every day | ORAL | 3 refills | Status: DC

## 2024-06-22 MED ORDER — CARVEDILOL 6.25 MG PO TABS: 6.2500 mg | ORAL_TABLET | Freq: Two times a day (BID) | ORAL | 0 refills | Status: DC

## 2024-06-22 MED ORDER — CARVEDILOL 6.25 MG PO TABS: 6.2500 mg | ORAL_TABLET | Freq: Two times a day (BID) | ORAL | 3 refills | Status: DC

## 2024-06-22 NOTE — Patient Instructions (Signed)
 Medication Instructions:  Your physician recommends the following medication changes.  START TAKING: Coreg  6.25 mg twice daily  DECREASE: Amlodipine  5 mg daily  *If you need a refill on your cardiac medications before your next appointment, please call your pharmacy*  Lab Work: Your provider would like for you to have following labs drawn today BMeT.   If you have labs (blood work) drawn today and your tests are completely normal, you will receive your results only by: MyChart Message (if you have MyChart) OR A paper copy in the mail If you have any lab test that is abnormal or we need to change your treatment, we will call you to review the results.   Follow-Up: At [facility], you and your health needs are our priority.  As part of our continuing mission to provide you with exceptional heart care, our providers are all part of one team.  This team includes your primary Cardiologist (physician) and Advanced Practice Providers or APPs (Physician Assistants and Nurse Practitioners) who all work together to provide you with the care you need, when you need it.  Your next appointment:   1 week(s)  Provider:   You may see [name], MD or Bernardino Bring, PA-C

## 2024-06-23 ENCOUNTER — Telehealth: Payer: Self-pay | Admitting: Physician Assistant

## 2024-06-23 ENCOUNTER — Other Ambulatory Visit: Payer: Self-pay

## 2024-06-23 ENCOUNTER — Ambulatory Visit: Payer: Self-pay | Admitting: Physician Assistant

## 2024-06-23 DIAGNOSIS — Z79899 Other long term (current) drug therapy: Secondary | ICD-10-CM

## 2024-06-23 LAB — BASIC METABOLIC PANEL WITH GFR
BUN/Creatinine Ratio: 20 (ref 12–28)
BUN: 17 mg/dL (ref 8–27)
CO2: 24 mmol/L (ref 20–29)
Calcium: 9.7 mg/dL (ref 8.7–10.3)
Chloride: 97 mmol/L (ref 96–106)
Creatinine, Ser: 0.86 mg/dL (ref 0.57–1.00)
Glucose: 105 mg/dL — ABNORMAL HIGH (ref 70–99)
Potassium: 4.5 mmol/L (ref 3.5–5.2)
Sodium: 139 mmol/L (ref 134–144)
eGFR: 75 mL/min/1.73 (ref >59)

## 2024-06-23 MED ORDER — FUROSEMIDE 40 MG PO TABS: 40.0000 mg | ORAL_TABLET | Freq: Two times a day (BID) | ORAL | 3 refills | Status: DC

## 2024-06-23 MED ORDER — CARVEDILOL 6.25 MG PO TABS: 6.2500 mg | ORAL_TABLET | Freq: Two times a day (BID) | ORAL | 0 refills | Status: DC

## 2024-06-23 NOTE — Telephone Encounter (Signed)
 Pt c/o medication issue:  1. Name of Medication: carvedilol  (COREG ) 6.25 MG tablet   2. How are you currently taking this medication (dosage and times per day)? not taking yet  3. Are you having a reaction (difficulty breathing--STAT)? No   4. What is your medication issue? Script was [facility] to [facility] but Per pt's ins it needs to be sent to [facility] [address] for them to cover it . Please re-send the 30 day supply [id number] - [place], [place] - [address] AT [facility] [address] [facility] Phone: [phone]  Fax: [phone]

## 2024-06-23 NOTE — Telephone Encounter (Signed)
 Changed RX to [facility] and notified patient of change

## 2024-06-28 NOTE — Progress Notes (Unsigned)
 Cardiology Office Note    Date:  06/29/2024   ID:  Jill Crosby, DOB [birth year], MRN [id number]  PCP:  Glendia Shad, MD  Cardiologist:  [name], MD  Electrophysiologist:  None   Chief Complaint: Follow-up  History of Present Illness:   Jill Crosby is a 63 y.o. female with history of HFpEF, type 1 diabetes, HTN, HLD, and morbid obesity who presents for follow-up of HFpEF.   She was evaluated as a new patient by Dr. Hanson on 05/18/2024 for evaluation of progressive weight gain and swelling.  She had previously undergone an echo in 01/2024 during a hospital admission for altered mental status in the setting of severe hyponatremia and influenza that showed an EF of 55 to 60%, mildly dilated LV internal cavity size, mild LVH, grade 2 diastolic dysfunction, normal RV systolic function and ventricular cavity size, mildly dilated left atrium, mild mitral regurgitation, and a tricuspid aortic valve.  At the time of her visit in 05/2024, she reported a 17 pound weight gain over the preceding 6 weeks and was using compression stockings with modest improvement.  She had also been started on furosemide  20 mg daily as needed earlier that month that she was taking on a regular basis.  She reported having previously been on hydrochlorothiazide  leading up to her admission in 01/2024, though this had not been restarted given concern that it may have contributed to her significant hyponatremia (of note she also had a severe GI illness shortly before her admission and was only drinking water ).  It was felt her lower extremity edema was likely multifactorial with a degree of HFpEF possibly contributing given diastolic dysfunction noted on echo in 01/2024.  Furosemide  was titrated to 40 mg daily with recommendation to restrict excess fluid and sodium.  She followed up in the office on 06/22/2024 with her weight being up another 13 pounds today when compared to her visit in 05/2024.  She continued to note lower  extremity swelling that was typically improved early in the morning and 1 progressive throughout the day.  She reported good urine output with furosemide  for 2 to 3 hours.  She also noted her lower extremity swelling became more pronounced after resuming work (works from home with her feet hanging down from the chair most of the day).  She did report wearing compression socks.  Labs obtained at that time showed stable renal function and electrolytes with recommendation for her to titrate furosemide  to 40 mg twice daily and follow-up today.  She comes in doing well today and is without symptoms of angina or cardiac decompensation.  She denies shortness of breath, progressive orthopnea, or early satiety.  She feels like her lower extremity swelling is a little improved when compared to her visit last week.  Her weight is down 5 pounds today when compared to her visit on 06/22/2024.  She does add small amounts of salt to food.  She is drinking less than 2 L of liquid daily.  She is tolerating titrated dose of furosemide  40 mg twice daily with target effect.  Reports that she does wear compression socks, not wearing in the office today.  No dizziness, presyncope, or syncope.   Labs independently reviewed: 05/2024 - BUN 17, serum creatinine 0.86, potassium 4.5 03/2024 - TSH normal, TC 129, TG 255, HDL 60, LDL 31, albumin 4.6, AST/ALT normal, A1c 7.4, Hgb 12.2, PLT 338   Past Medical History:  Diagnosis Date   Anxiety    Depression  Elevated BP    Hypercholesterolemia    Increased BMI    Menopause    Morbid obesity (HCC)    Type I diabetes mellitus (HCC)     Past Surgical History:  Procedure Laterality Date   BREAST BIOPSY Left 05/09/2020   ribbon clip, stereo bx, HEMANGIOMA, BENIGN. - MAMMARY EPITHELIUM IS NOT PRESENT.   CATARACT EXTRACTION W/PHACO Left 11/08/2020   Procedure: CATARACT EXTRACTION PHACO AND INTRAOCULAR LENS PLACEMENT (IOC) LEFT DIABETIC 4.14 00:52.1 ;  Surgeon: Jaye Fallow,  MD;  Location: [facility];  Service: Ophthalmology;  Laterality: Left;   CATARACT EXTRACTION W/PHACO Right 12/04/2020   Procedure: CATARACT EXTRACTION PHACO AND INTRAOCULAR LENS PLACEMENT (IOC) RIGHT DIABETIC 6.11 00:46.1;  Surgeon: Jaye Fallow, MD;  Location: [facility];  Service: Ophthalmology;  Laterality: Right;  Diabetic - insulin    CESAREAN SECTION  1997   COLONOSCOPY WITH PROPOFOL  N/A 01/18/2021   Procedure: COLONOSCOPY WITH PROPOFOL ;  Surgeon: Jinny Carmine, MD;  Location: [facility];  Service: Endoscopy;  Laterality: N/A;  priority 4    Current Medications: Current Meds  Medication Sig   BAYER MICROLET LANCETS lancets USE AS DIRECTED TO CHECK  SUGARS 1 TO 2 TIMES A DAY   cholecalciferol (VITAMIN D3) 25 MCG (1000 UT) tablet Take 1,000 Units by mouth daily.   CONTOUR NEXT TEST test strip USE TO CHECK BLOOD GLUCOSE 6 TO  7 TIMES DAILY   Cyanocobalamin  (B-12 PO) Take by mouth.   furosemide  (LASIX ) 40 MG tablet Take 1 tablet (40 mg total) by mouth 2 (two) times daily.   insulin  lispro (HUMALOG  KWIKPEN) 100 UNIT/ML KwikPen INJECT SUBCUTANEOUSLY 65  UNITS DAILY.  (Takes distributed over three meals counting carbs)   Insulin  Pen Needle 32G X 4 MM MISC Use as directed with insulin  4 x daily.   LANTUS  SOLOSTAR 100 UNIT/ML Solostar Pen INJECT SUBCUTANEOUSLY 80 UNITS  AT NIGHT   levocetirizine (XYZAL ) 5 MG tablet TAKE 1 TABLET BY MOUTH IN THE  EVENING   lisinopril  (ZESTRIL ) 40 MG tablet Take 1 tablet (40 mg total) by mouth daily.   REPATHA  SURECLICK 140 MG/ML SOAJ INJECT 1 PEN SUBCUTANEOUSLY  EVERY 2 WEEKS   sertraline  (ZOLOFT ) 50 MG tablet TAKE 1 TABLET BY MOUTH DAILY   [DISCONTINUED] amLODipine  (NORVASC ) 5 MG tablet Take 1 tablet (5 mg total) by mouth daily.   [DISCONTINUED] carvedilol  (COREG ) 6.25 MG tablet Take 1 tablet (6.25 mg total) by mouth 2 (two) times daily.    Allergies:   Codeine   Social History   Socioeconomic History   Marital status:  Single    Spouse name: Not on file   Number of children: Not on file   Years of education: Not on file   Highest education level: 12th grade  Occupational History   Not on file  Tobacco Use   Smoking status: Former    Current packs/day: 0.50    Average packs/day: 0.5 packs/day for 20.0 years (10.0 ttl pk-yrs)    Types: Cigarettes   Smokeless tobacco: Never  Substance and Sexual Activity   Alcohol use: No    Alcohol/week: 0.0 standard drinks of alcohol   Drug use: No   Sexual activity: Not Currently    Birth control/protection: Post-menopausal  Other Topics Concern   Not on file  Social History Narrative   Not on file   Social Drivers of Health   Financial Resource Strain: Low Risk  (06/08/2024)   Received from [facility]   Overall  Financial Resource Strain (CARDIA)    Difficulty of Paying Living Expenses: Not hard at all  Food Insecurity: No Food Insecurity (06/08/2024)   Received from [facility]   Hunger Vital Sign    Within the past 12 months, you worried that your food would run out before you got the money to buy more.: Never true    Within the past 12 months, the food you bought just didn't last and you didn't have money to get more.: Never true  Transportation Needs: No Transportation Needs (06/08/2024)   Received from [facility] - Transportation    In the past 12 months, has lack of transportation kept you from medical appointments or from getting medications?: No    Lack of Transportation (Non-Medical): No  Physical Activity: Inactive (05/12/2024)   Exercise Vital Sign    Days of Exercise per Week: 0 days    Minutes of Exercise per Session: Not on file  Stress: Stress Concern Present (05/12/2024)   [employer] of Occupational Health - Occupational Stress Questionnaire    Feeling of Stress: To some extent  Social Connections: Moderately Integrated (05/12/2024)   Social Connection and Isolation Panel     Frequency of Communication with Friends and Family: More than three times a week    Frequency of Social Gatherings with Friends and Family: Once a week    Attends Religious Services: More than 4 times per year    Active Member of [employer] or Organizations: Yes    Attends Engineer, structural: More than 4 times per year    Marital Status: Divorced     Family History:  The patient's family history includes Breast cancer in her maternal aunt and maternal grandmother; Cancer in her father; Colon cancer in her maternal grandfather and paternal grandfather; Colon polyps in her mother; Diabetes in her maternal grandfather; Hyperlipidemia in her mother; Hypertension in her mother. There is no history of Heart disease or Ovarian cancer.  ROS:   12-point review of systems is negative unless otherwise noted in the HPI.   EKGs/Labs/Other Studies Reviewed:    Studies reviewed were summarized above. The additional studies were reviewed today:   2D echo 01/05/2024: 1. Left ventricular ejection fraction, by estimation, is 55 to 60%. The  left ventricle has normal function. Left ventricular endocardial border  not optimally defined to evaluate regional wall motion. The left  ventricular internal cavity size was mildly  dilated. There is mild left ventricular hypertrophy. Left ventricular  diastolic parameters are consistent with Grade II diastolic dysfunction  (pseudonormalization).   2. Right ventricular systolic function is normal. The right ventricular  size is normal.   3. Left atrial size was mildly dilated.   4. The mitral valve is grossly normal. Mild mitral valve regurgitation.  No evidence of mitral stenosis.   5. The aortic valve is tricuspid. Aortic valve regurgitation is not  visualized. No aortic stenosis is present.    EKG:  EKG is not ordered today.    Recent Labs: 01/03/2024: B Natriuretic Peptide 239.2 01/10/2024: Magnesium  2.3 04/27/2024: ALT 31; Hemoglobin 12.2;  Platelets 338; TSH 2.010 06/22/2024: BUN 17; Creatinine, Ser 0.86; Potassium 4.5; Sodium 139  Recent Lipid Panel    Component Value Date/Time   CHOL 129 04/27/2024 1226   TRIG 255 (H) 04/27/2024 1226   HDL 60 04/27/2024 1226   CHOLHDL 2.2 04/27/2024 1226   CHOLHDL 2.1 01/03/2024 2036   VLDL 17 01/03/2024 2036  LDLCALC 31 04/27/2024 1226   LDLDIRECT 32 01/03/2024 2036    PHYSICAL EXAM:    VS:  BP (!) 156/79   Pulse 65   Ht 5' 4.25 (1.632 m)   Wt 282 lb 12.8 oz (128.3 kg)   SpO2 95%   BMI 48.17 kg/m   BMI: Body mass index is 48.17 kg/m.  Physical Exam Vitals reviewed.  Constitutional:      Appearance: She is well-developed.  HENT:     Head: Normocephalic and atraumatic.  Eyes:     General:        Right eye: No discharge.        Left eye: No discharge.  Neck:     Comments: JVD difficult to assess secondary to body habitus. Cardiovascular:     Rate and Rhythm: Normal rate and regular rhythm.     Heart sounds: Normal heart sounds, S1 normal and S2 normal. Heart sounds not distant. No midsystolic click and no opening snap. No murmur heard.    No friction rub.  Pulmonary:     Effort: Pulmonary effort is normal. No respiratory distress.     Breath sounds: Normal breath sounds. No decreased breath sounds, wheezing, rhonchi or rales.  Abdominal:     General: There is distension.     Palpations: Abdomen is soft.     Tenderness: There is no abdominal tenderness.  Musculoskeletal:     Cervical back: Normal range of motion.     Right lower leg: Edema present.     Left lower leg: Edema present.     Comments: 1+ bilateral chronic woody lower extremity edema.  Skin:    General: Skin is warm and dry.     Nails: There is no clubbing.  Neurological:     Mental Status: She is alert and oriented to person, place, and time.  Psychiatric:        Speech: Speech normal.        Behavior: Behavior normal.        Thought Content: Thought content normal.        Judgment: Judgment  normal.     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  Overall, lower extremity swelling is improved today when compared to her visit last week and her weight is down 5 pounds with escalation of diuresis.  Discontinue amlodipine .  Check BMP, if renal function and electrolytes are stable, anticipate transitioning furosemide  to torsemide  given abdominal distention.  If there is limitation in outpatient diuresis in the setting of laboratory abnormalities we may need to consider R/LHC to better understand her hemodynamics and assist with pharmacotherapy.  HTN: Blood pressure is mildly elevated in the office today.  We are discontinuing amlodipine  as outlined above with titration of carvedilol  to 12.5 mg twice daily.  Diuresis as outlined above as well.  She otherwise remains on lisinopril  40 mg daily.  Low-sodium diet is recommended.  Obesity: Weight loss is encouraged throughout healthy diet and regular exercise.  Contributing to lower extremity swelling.    Disposition: F/u with Dr. Mady or an APP in 2 weeks.   Medication Adjustments/Labs and Tests Ordered: Current  medicines are reviewed at length with the patient today.  Concerns regarding medicines are outlined above. Medication changes, Labs and Tests ordered today are summarized above and listed in the Patient Instructions accessible in Encounters.   Signed, Bernardino Bring, PA-C 06/29/2024 4:52 PM     [facility] [place] [address] [place], [place] [address] [phone]

## 2024-06-29 ENCOUNTER — Ambulatory Visit: Attending: Physician Assistant | Admitting: Physician Assistant

## 2024-06-29 ENCOUNTER — Encounter: Payer: Self-pay | Admitting: Physician Assistant

## 2024-06-29 VITALS — BP 156/79 | HR 65 | Ht 64.25 in | Wt 282.8 lb

## 2024-06-29 DIAGNOSIS — I10 Essential (primary) hypertension: Secondary | ICD-10-CM

## 2024-06-29 DIAGNOSIS — Z79899 Other long term (current) drug therapy: Secondary | ICD-10-CM

## 2024-06-29 DIAGNOSIS — I5032 Chronic diastolic (congestive) heart failure: Secondary | ICD-10-CM | POA: Diagnosis not present

## 2024-06-29 DIAGNOSIS — R600 Localized edema: Secondary | ICD-10-CM

## 2024-06-29 DIAGNOSIS — E66813 Obesity, class 3: Secondary | ICD-10-CM

## 2024-06-29 DIAGNOSIS — Z6841 Body Mass Index (BMI) 40.0 and over, adult: Secondary | ICD-10-CM

## 2024-06-29 MED ORDER — CARVEDILOL 12.5 MG PO TABS: 12.5000 mg | ORAL_TABLET | Freq: Two times a day (BID) | ORAL | 3 refills | Status: DC

## 2024-06-29 NOTE — Patient Instructions (Signed)
 Medication Instructions:  Your physician recommends the following medication changes.  STOP TAKING: Amlodipine   INCREASE: Coreg  12.5 mg twice daily   *If you need a refill on your cardiac medications before your next appointment, please call your pharmacy*  Lab Work: Your provider would like for you to have following labs drawn today BMeT.   If you have labs (blood work) drawn today and your tests are completely normal, you will receive your results only by: MyChart Message (if you have MyChart) OR A paper copy in the mail If you have any lab test that is abnormal or we need to change your treatment, we will call you to review the results.  Testing/Procedures: None ordered at this time   Follow-Up: At [facility], you and your health needs are our priority.  As part of our continuing mission to provide you with exceptional heart care, our providers are all part of one team.  This team includes your primary Cardiologist (physician) and Advanced Practice Providers or APPs (Physician Assistants and Nurse Practitioners) who all work together to provide you with the care you need, when you need it.  Your next appointment:   2-3 week(s)  Provider:   You may see [name], MD or Bernardino Bring, PA-C

## 2024-06-30 ENCOUNTER — Ambulatory Visit: Payer: Self-pay | Admitting: Physician Assistant

## 2024-06-30 DIAGNOSIS — Z79899 Other long term (current) drug therapy: Secondary | ICD-10-CM

## 2024-06-30 LAB — BASIC METABOLIC PANEL WITH GFR
BUN/Creatinine Ratio: 16 (ref 12–28)
BUN: 16 mg/dL (ref 8–27)
CO2: 26 mmol/L (ref 20–29)
Calcium: 9.8 mg/dL (ref 8.7–10.3)
Chloride: 95 mmol/L — ABNORMAL LOW (ref 96–106)
Creatinine, Ser: 0.97 mg/dL (ref 0.57–1.00)
Glucose: 59 mg/dL — ABNORMAL LOW (ref 70–99)
Potassium: 4.3 mmol/L (ref 3.5–5.2)
Sodium: 137 mmol/L (ref 134–144)
eGFR: 65 mL/min/1.73 (ref >59)

## 2024-06-30 MED ORDER — TORSEMIDE 20 MG PO TABS: 20.0000 mg | ORAL_TABLET | Freq: Two times a day (BID) | ORAL | 3 refills | Status: DC

## 2024-06-30 MED ORDER — POTASSIUM CHLORIDE ER 10 MEQ PO TBCR: 10.0000 meq | EXTENDED_RELEASE_TABLET | Freq: Two times a day (BID) | ORAL | 3 refills | Status: DC

## 2024-07-14 ENCOUNTER — Other Ambulatory Visit: Payer: Self-pay | Admitting: Internal Medicine

## 2024-07-20 ENCOUNTER — Ambulatory Visit: Attending: Internal Medicine | Admitting: Internal Medicine

## 2024-07-20 ENCOUNTER — Encounter: Payer: Self-pay | Admitting: Internal Medicine

## 2024-07-20 VITALS — BP 140/65 | HR 67 | Ht 64.0 in | Wt 281.2 lb

## 2024-07-20 DIAGNOSIS — R0602 Shortness of breath: Secondary | ICD-10-CM

## 2024-07-20 DIAGNOSIS — R0600 Dyspnea, unspecified: Secondary | ICD-10-CM | POA: Diagnosis not present

## 2024-07-20 DIAGNOSIS — R0609 Other forms of dyspnea: Secondary | ICD-10-CM | POA: Diagnosis not present

## 2024-07-20 DIAGNOSIS — I10 Essential (primary) hypertension: Secondary | ICD-10-CM | POA: Diagnosis not present

## 2024-07-20 DIAGNOSIS — I5032 Chronic diastolic (congestive) heart failure: Secondary | ICD-10-CM

## 2024-07-20 MED ORDER — TORSEMIDE 40 MG PO TABS: 40.0000 mg | ORAL_TABLET | Freq: Two times a day (BID) | ORAL | 3 refills | Status: DC

## 2024-07-20 NOTE — Patient Instructions (Signed)
 Medication Instructions:  Your physician recommends the following medication changes.  INCREASE: Torsemide  to 40 mg two times daily   *If you need a refill on your cardiac medications before your next appointment, please call your pharmacy*  Lab Work: Your provider would like for you to have following labs drawn today BMP.   If you have labs (blood work) drawn today and your tests are completely normal, you will receive your results only by: MyChart Message (if you have MyChart) OR A paper copy in the mail If you have any lab test that is abnormal or we need to change your treatment, we will call you to review the results.  Testing/Procedures:    Please report to Radiology at the [facility] Main Entrance 30 minutes early for your test.  [address] [place], [place] [address]                         OR   Please report to Radiology at [facility] Main Entrance, medical mall, 30 mins prior to your test.  [address]  [place], [place]  How to Prepare for Your Cardiac PET/CT Stress Test:  Nothing to eat or drink, except water , 3 hours prior to arrival time.  NO caffeine/decaffeinated products, or chocolate 12 hours prior to arrival. (Please note decaffeinated beverages (teas/coffees) still contain caffeine).  If you have caffeine within 12 hours prior, the test will need to be rescheduled.  Medication instructions: Do not take erectile dysfunction medications for 72 hours prior to test (sildenafil, tadalafil) Do not take nitrates (isosorbide mononitrate, Ranexa) the day before or day of test Do not take tamsulosin the day before or morning of test Hold theophylline containing medications for 12 hours. Hold Dipyridamole 48 hours prior to the test.  Diabetic Preparation: If able to eat breakfast prior to 3 hour fasting, you may take all medications, including your insulin . Do not worry if you miss your breakfast dose of insulin  - start  at your next meal. If you do not eat prior to 3 hour fast-Hold all diabetes (oral and insulin ) medications. Patients who wear a continuous glucose monitor MUST remove the device prior to scanning.  You may take your remaining medications with water .  NO perfume, cologne or lotion on chest or abdomen area. FEMALES - Please avoid wearing dresses to this appointment.  Total time is 1 to 2 hours; you may want to bring reading material for the waiting time.  IF YOU THINK YOU MAY BE PREGNANT, OR ARE NURSING PLEASE INFORM THE TECHNOLOGIST.  In preparation for your appointment, medication and supplies will be purchased.  Appointment availability is limited, so if you need to cancel or reschedule, please call the Radiology Department Scheduler at [phone] 24 hours in advance to avoid a cancellation fee of $100.00  What to Expect When you Arrive:  Once you arrive and check in for your appointment, you will be taken to a preparation room within the Radiology Department.  A technologist or Nurse will obtain your medical history, verify that you are correctly prepped for the exam, and explain the procedure.  Afterwards, an IV will be started in your arm and electrodes will be placed on your skin for EKG monitoring during the stress portion of the exam. Then you will be escorted to the PET/CT scanner.  There, staff will get you positioned on the scanner and obtain a blood pressure and EKG.  During the  exam, you will continue to be connected to the EKG and blood pressure machines.  A small, safe amount of a radioactive tracer will be injected in your IV to obtain a series of pictures of your heart along with an injection of a stress agent.    After your Exam:  It is recommended that you eat a meal and drink a caffeinated beverage to counter act any effects of the stress agent.  Drink plenty of fluids for the remainder of the day and urinate frequently for the first couple of hours after the exam.  Your  doctor will inform you of your test results within 7-10 business days.  For more information and frequently asked questions, please visit our website: [email or web address]  For questions about your test or how to prepare for your test, please call: Cardiac Imaging Nurse Navigators Office: [phone]   Follow-Up: At [facility], you and your health needs are our priority.  As part of our continuing mission to provide you with exceptional heart care, our providers are all part of one team.  This team includes your primary Cardiologist (physician) and Advanced Practice Providers or APPs (Physician Assistants and Nurse Practitioners) who all work together to provide you with the care you need, when you need it.  Your next appointment:   6 week(s)  Provider:   You may see [name], MD or one of the following Advanced Practice Providers on your designated Care Team:   Lonni Meager, NP Lesley Maffucci, PA-C Bernardino Bring, PA-C Cadence Martin, PA-C Tylene Lunch, NP Barnie Hila, NP

## 2024-07-20 NOTE — Progress Notes (Unsigned)
 Cardiology Office Note:  .   Date:  07/21/2024  ID:  Jill Crosby, DOB 19-Jun-1961, MRN [id number] PCP: Glendia Shad, MD  [facility] Providers Cardiologist:  [name], MD     History of Present Illness: .   Jill Crosby is a 63 y.o. female with history of HFpEF, hypertension, hyperlipidemia, type 1 diabetes mellitus, and morbid obesity, who presents for follow-up of HFpEF.  She was last seen in our office 3 weeks ago by Bernardino Bring, PA, at which time she reported slight improvement in her lower extremity edema after successive escalation of furosemide  up to 40 mg twice daily.  She denied dyspnea and chest pain.  Amlodipine  was also discontinued at her last visit and torsemide  started in place of furosemide .  Today, Jill Crosby reports that her leg swelling has improved, which she attributes to a combination of stopping amlodipine  and switching from furosemide  to torsemide .  She has also made significant dietary changes though she still notes that her sodium intake is probably more than what it should be.  She denies chest pain and shortness of breath at rest but reports a couple episodes of paroxysmal nocturnal dyspnea.  It is reminiscent of what she experienced in the past with anxiety.  She does not have orthopnea but has some exertional dyspnea.  ROS: See HPI  Studies Reviewed: SABRA   EKG Interpretation Date/Time:  Wednesday July 20 2024 10:58:52 EDT Ventricular Rate:  67 PR Interval:  190 QRS Duration:  72 QT Interval:  384 QTC Calculation: 405 R Axis:   74  Text Interpretation: Normal sinus rhythm Low voltage QRS Borderline ECG When compared with ECG of 18-May-2024 10:50, HEART RATE has decreased Confirmed by Alvis Edgell [phone]) on 07/21/2024 8:35:34 AM    TTE (01/05/2024): Mildly dilated left ventricle with mild LVH. LVEF 55-6% with grade 2 diastolic dysfunction. Normal RV size and function. Mild left atrial enlargement. No pericardial effusion. Grossly normal  mitral valve with mild regurgitation. Aortic sclerosis without stenosis.   Risk Assessment/Calculations:        Physical Exam:   VS:  BP (!) 140/65 (BP Location: Left Arm, Patient Position: Sitting, Cuff Size: Normal)   Pulse 67   Ht 5' 4 (1.626 m)   Wt 281 lb 3.2 oz (127.6 kg)   SpO2 95%   BMI 48.27 kg/m    Wt Readings from Last 3 Encounters:  07/20/24 281 lb 3.2 oz (127.6 kg)  06/29/24 282 lb 12.8 oz (128.3 kg)  06/22/24 287 lb (130.2 kg)    General:  NAD. Neck: JVP approximately 6-8 cm, though body habitus limits evaluation. Lungs: Clear to auscultation bilaterally without wheezes or crackles. Heart: Regular rate and rhythm without murmurs, rubs, or gallops. Abdomen: Obese, firm abdomen. Extremities: 1+ nonpitting edema both lower extremities.  ASSESSMENT AND PLAN: .    Chronic HFpEF: Volume status has improved though Jill Crosby still appears volume overloaded on exam with a firm, distended abdomen and nonpitting edema in both legs.  JVP also appears to be a bit elevated though difficult to assess due to her body habitus.  We have discussed further evaluation and management options and have agreed to increase her torsemide  to 40 mg twice daily.  I will check a BMP today and likely again in 2 weeks based on the results of today's labs.  We have also discussed that diastolic heart failure can be a sign of underlying CAD.  Given her long history of type 1 diabetes mellitus,  we agreed to perform a myocardial PET/CT for further evaluation, given her morbid obesity and limited functional capacity.  Dyspnea on exertion and paroxysmal nocturnal dyspnea: I suspect this is due to a combination of HFpEF and obesity.  I think it is prudent to exclude underlying CAD given her history of type 1 diabetes mellitus and morbid obesity.  We will proceed with a myocardial PET/CT.  If this is unrevealing and dyspnea persist, right heart catheterization to better understand her filling pressures will  need to be considered.  Hypertension: Blood pressure suboptimally controlled today.  As above, we will escalate diuresis which hopefully will help with her blood pressure.  Continue current doses of carvedilol  and lisinopril .    Informed Consent   Shared Decision Making/Informed Consent The risks [chest pain, shortness of breath, cardiac arrhythmias, dizziness, blood pressure fluctuations, myocardial infarction, stroke/transient ischemic attack, nausea, vomiting, allergic reaction, radiation exposure, metallic taste sensation and life-threatening complications (estimated to be 1 in 10,000)], benefits (risk stratification, diagnosing coronary artery disease, treatment guidance) and alternatives of a cardiac PET stress test were discussed in detail with Jill Crosby and she agrees to proceed.     Dispo: Return to clinic in 6 weeks.  Signed, [name], MD

## 2024-07-21 ENCOUNTER — Encounter: Payer: Self-pay | Admitting: Internal Medicine

## 2024-07-21 DIAGNOSIS — R0600 Dyspnea, unspecified: Secondary | ICD-10-CM | POA: Insufficient documentation

## 2024-07-21 DIAGNOSIS — R0609 Other forms of dyspnea: Secondary | ICD-10-CM | POA: Insufficient documentation

## 2024-07-21 LAB — BASIC METABOLIC PANEL WITH GFR
BUN/Creatinine Ratio: 21 (ref 12–28)
BUN: 22 mg/dL (ref 8–27)
CO2: 26 mmol/L (ref 20–29)
Calcium: 10.1 mg/dL (ref 8.7–10.3)
Chloride: 94 mmol/L — ABNORMAL LOW (ref 96–106)
Creatinine, Ser: 1.06 mg/dL — ABNORMAL HIGH (ref 0.57–1.00)
Glucose: 46 mg/dL — ABNORMAL LOW (ref 70–99)
Potassium: 4.5 mmol/L (ref 3.5–5.2)
Sodium: 136 mmol/L (ref 134–144)
eGFR: 58 mL/min/1.73 — ABNORMAL LOW (ref >59)

## 2024-07-22 ENCOUNTER — Ambulatory Visit: Payer: Self-pay | Admitting: Internal Medicine

## 2024-07-22 ENCOUNTER — Telehealth: Payer: Self-pay

## 2024-07-22 DIAGNOSIS — Z79899 Other long term (current) drug therapy: Secondary | ICD-10-CM

## 2024-07-22 MED ORDER — TORSEMIDE 20 MG PO TABS: 40.0000 mg | ORAL_TABLET | Freq: Two times a day (BID) | ORAL | 3 refills | Status: DC

## 2024-07-22 NOTE — Telephone Encounter (Signed)
 I recommend that she take 2 torsemide  20 mg tablets twice daily (for total of 40 mg BID).  [name], MD [facility]

## 2024-07-22 NOTE — Telephone Encounter (Signed)
 [facility] mail order pharmacy is requesting clarification for medication tosemide 40 mg tablet. Pharmacy is stating the medication is not coverd under pt's insurance and would like an alternative that is covered. Seek in Media. Please address

## 2024-07-22 NOTE — Telephone Encounter (Signed)
Updated prescription sent to pharmacy as requested.

## 2024-07-22 NOTE — Progress Notes (Signed)
 Since fluid overload is our main concern, I recommend that Jill Crosby try to limit her fluid intake to ~2 liters/day.  [name], MD [facility]

## 2024-07-28 ENCOUNTER — Other Ambulatory Visit
Admission: RE | Admit: 2024-07-28 | Discharge: 2024-07-28 | Disposition: A | Discharge status: Home / Self Care | Source: Ambulatory Visit | Attending: Internal Medicine | Admitting: Internal Medicine

## 2024-07-28 ENCOUNTER — Other Ambulatory Visit: Payer: Self-pay | Admitting: *Deleted

## 2024-07-28 DIAGNOSIS — R600 Localized edema: Secondary | ICD-10-CM | POA: Diagnosis present

## 2024-07-28 DIAGNOSIS — I5032 Chronic diastolic (congestive) heart failure: Secondary | ICD-10-CM | POA: Diagnosis present

## 2024-07-28 LAB — BASIC METABOLIC PANEL WITH GFR
Anion gap: 14 (ref 5–15)
BUN: 28 mg/dL — ABNORMAL HIGH (ref 8–23)
CO2: 29 mmol/L (ref 22–32)
Calcium: 9.5 mg/dL (ref 8.9–10.3)
Chloride: 91 mmol/L — ABNORMAL LOW (ref 98–111)
Creatinine, Ser: 1.01 mg/dL — ABNORMAL HIGH (ref 0.44–1.00)
GFR, Estimated: >60 mL/min (ref >60)
Glucose, Bld: 172 mg/dL — ABNORMAL HIGH (ref 70–99)
Potassium: 3.6 mmol/L (ref 3.5–5.1)
Sodium: 134 mmol/L — ABNORMAL LOW (ref 135–145)

## 2024-07-28 NOTE — Telephone Encounter (Signed)
 Pt came in for repeat labs and our lab was closed. Reentered order for labs to be done over at the [facility]. No further needs.

## 2024-07-29 ENCOUNTER — Ambulatory Visit: Payer: Self-pay | Admitting: Internal Medicine

## 2024-07-29 DIAGNOSIS — R600 Localized edema: Secondary | ICD-10-CM

## 2024-07-29 MED ORDER — TORSEMIDE 20 MG PO TABS: 20.0000 mg | ORAL_TABLET | Freq: Two times a day (BID) | ORAL | 0 refills | Status: DC

## 2024-08-07 ENCOUNTER — Other Ambulatory Visit: Payer: Self-pay | Admitting: Internal Medicine

## 2024-08-18 LAB — BASIC METABOLIC PANEL WITH GFR
BUN/Creatinine Ratio: 28 (ref 12–28)
BUN: 39 mg/dL — ABNORMAL HIGH (ref 8–27)
CO2: 24 mmol/L (ref 20–29)
Calcium: 10.1 mg/dL (ref 8.7–10.3)
Chloride: 95 mmol/L — ABNORMAL LOW (ref 96–106)
Creatinine, Ser: 1.37 mg/dL — ABNORMAL HIGH (ref 0.57–1.00)
Glucose: 149 mg/dL — ABNORMAL HIGH (ref 70–99)
Potassium: 4.8 mmol/L (ref 3.5–5.2)
Sodium: 136 mmol/L (ref 134–144)
eGFR: 43 mL/min/1.73 — ABNORMAL LOW (ref >59)

## 2024-08-18 MED ORDER — TORSEMIDE 20 MG PO TABS: 20.0000 mg | ORAL_TABLET | Freq: Every day | ORAL | 0 refills | Status: DC

## 2024-08-18 NOTE — Progress Notes (Signed)
 Yes, she can reduce potassium chloride  to 10 mEq daily.  [name], MD [facility]

## 2024-08-23 ENCOUNTER — Encounter (HOSPITAL_COMMUNITY): Payer: Self-pay

## 2024-08-25 ENCOUNTER — Ambulatory Visit
Admission: RE | Admit: 2024-08-25 | Discharge: 2024-08-25 | Disposition: A | Discharge status: Home / Self Care | Source: Ambulatory Visit | Attending: Internal Medicine | Admitting: Internal Medicine

## 2024-08-25 DIAGNOSIS — K760 Fatty (change of) liver, not elsewhere classified: Secondary | ICD-10-CM | POA: Insufficient documentation

## 2024-08-25 DIAGNOSIS — R0609 Other forms of dyspnea: Secondary | ICD-10-CM | POA: Insufficient documentation

## 2024-08-25 DIAGNOSIS — I2510 Atherosclerotic heart disease of native coronary artery without angina pectoris: Secondary | ICD-10-CM | POA: Diagnosis not present

## 2024-08-25 MED ORDER — RUBIDIUM RB82 GENERATOR (RUBYFILL)
25.0000 | PACK | Freq: Once | INTRAVENOUS | Status: AC
  Administered 2024-08-25: 24.92 via INTRAVENOUS

## 2024-08-25 MED ORDER — REGADENOSON 0.4 MG/5ML IV SOLN
INTRAVENOUS | Status: AC
  Filled 2024-08-25: qty 5

## 2024-08-25 MED ORDER — RUBIDIUM RB82 GENERATOR (RUBYFILL)
25.0000 | PACK | Freq: Once | INTRAVENOUS | Status: AC
  Administered 2024-08-25: 24.97 via INTRAVENOUS

## 2024-08-25 MED ORDER — REGADENOSON 0.4 MG/5ML IV SOLN
0.4000 mg | Freq: Once | INTRAVENOUS | Status: AC
  Administered 2024-08-25: 0.4 mg via INTRAVENOUS
  Filled 2024-08-25: qty 5

## 2024-08-27 LAB — NM PET CT CARDIAC PERFUSION MULTI W/ABSOLUTE BLOODFLOW
MBFR: 1.7
Nuc Rest EF: 57 %
Nuc Stress EF: 62 %
Peak HR: 89 {beats}/min
Rest HR: 68 {beats}/min
Rest MBF: 0.71 ml/g/min
Rest Nuclear Isotope Dose: 24.9 mCi
SRS: 0
SSS: 7
ST Depression (mm): 0 mm
Stress MBF: 1.21 ml/g/min
Stress Nuclear Isotope Dose: 25 mCi
TID: 1.1

## 2024-09-01 LAB — OPHTHALMOLOGY REPORT-SCANNED

## 2024-09-05 ENCOUNTER — Encounter: Payer: Self-pay | Admitting: Internal Medicine

## 2024-09-05 ENCOUNTER — Encounter: Payer: Self-pay | Admitting: Physician Assistant

## 2024-09-05 ENCOUNTER — Ambulatory Visit: Attending: Physician Assistant | Admitting: Physician Assistant

## 2024-09-05 VITALS — BP 137/50 | HR 70 | Ht 64.0 in | Wt 271.6 lb

## 2024-09-05 DIAGNOSIS — I5032 Chronic diastolic (congestive) heart failure: Secondary | ICD-10-CM

## 2024-09-05 DIAGNOSIS — R0609 Other forms of dyspnea: Secondary | ICD-10-CM

## 2024-09-05 DIAGNOSIS — I25118 Atherosclerotic heart disease of native coronary artery with other forms of angina pectoris: Secondary | ICD-10-CM | POA: Diagnosis not present

## 2024-09-05 DIAGNOSIS — I2510 Atherosclerotic heart disease of native coronary artery without angina pectoris: Secondary | ICD-10-CM | POA: Insufficient documentation

## 2024-09-05 DIAGNOSIS — I10 Essential (primary) hypertension: Secondary | ICD-10-CM

## 2024-09-05 DIAGNOSIS — R600 Localized edema: Secondary | ICD-10-CM | POA: Diagnosis not present

## 2024-09-05 DIAGNOSIS — R0600 Dyspnea, unspecified: Secondary | ICD-10-CM

## 2024-09-05 DIAGNOSIS — I2089 Other forms of angina pectoris: Secondary | ICD-10-CM

## 2024-09-05 DIAGNOSIS — Z79899 Other long term (current) drug therapy: Secondary | ICD-10-CM

## 2024-09-05 MED ORDER — CARVEDILOL 12.5 MG PO TABS: 12.5000 mg | ORAL_TABLET | Freq: Two times a day (BID) | ORAL | 3 refills | Status: AC

## 2024-09-05 MED ORDER — TORSEMIDE 20 MG PO TABS: 20.0000 mg | ORAL_TABLET | Freq: Every day | ORAL | 3 refills | Status: DC

## 2024-09-05 NOTE — Progress Notes (Signed)
 Cardiology Office Note    Date:  09/05/2024   ID:  DONN WILMOT, DOB 03/11/1961, MRN [id number]  PCP:  Glendia Shad, MD  Cardiologist:  [name], MD  Electrophysiologist:  None   Chief Complaint: Follow-up  History of Present Illness:   SHALI VESEY is a 63 y.o. female with history of coronary artery calcification, HFpEF, type 1 diabetes, HTN, HLD, and morbid obesity who presents for follow-up of myocardial PET/CT.   She was evaluated as a new patient by Dr. Hanson on 05/18/2024 for evaluation of progressive weight gain and swelling.  She had previously undergone an echo in 01/2024 during a hospital admission for altered mental status in the setting of severe hyponatremia and influenza that showed an EF of 55 to 60%, mildly dilated LV internal cavity size, mild LVH, grade 2 diastolic dysfunction, normal RV systolic function and ventricular cavity size, mildly dilated left atrium, mild mitral regurgitation, and a tricuspid aortic valve.  At the time of her visit in 05/2024, she reported a 17 pound weight gain over the preceding 6 weeks and was using compression stockings with modest improvement.  She had also been started on furosemide  20 mg daily as needed earlier that month that she was taking on a regular basis.  She reported having previously been on hydrochlorothiazide  leading up to her admission in 01/2024, though this had not been restarted given concern that it may have contributed to her significant hyponatremia (of note she also had a severe GI illness shortly before her admission and was only drinking water ).  It was felt her lower extremity edema was likely multifactorial with a degree of HFpEF possibly contributing given diastolic dysfunction noted on echo in 01/2024.  Furosemide  was titrated to 40 mg daily with recommendation to restrict excess fluid and sodium.  She followed up in the office on 06/22/2024 with her weight being up another 13 pounds today when compared to her  visit in 05/2024.  She continued to note lower extremity swelling that was typically improved early in the morning and 1 progressive throughout the day.  She reported good urine output with furosemide  for 2 to 3 hours.  She also noted her lower extremity swelling became more pronounced after resuming work (works from home with her feet hanging down from the chair most of the day).  She did report wearing compression socks.  Labs obtained at that time showed stable renal function and electrolytes with recommendation for her to titrate furosemide  to 40 mg twice daily.  At follow-up in 05/2024, amlodipine  was discontinued and furosemide  was transitioned to torsemide .  She was last seen in the office on 07/20/2024 with improvement in lower extremity swelling following the above changes.  She was without chest pain or shortness of breath, though did report a couple episodes of paroxysmal nocturnal dyspnea.  Subsequent Myocardial PET/CT on 08/25/2024 was intermediate risk with a medium defect with moderate reduction in uptake present in the apical anterior and apex location that was reversible with abnormal wall motion in the defect area consistent with ischemia.  LVEF normal.  Coronary artery calcification present in the LAD and LCx distributions.  She comes in accompanied by her sister today and is doing well from a cardiac perspective, currently without symptoms of angina or cardiac decompensation.  She has been without further episodes of PND.  No frank angina.  She does report some exertional shortness of breath, though has attributed this to bilateral knee discomfort/increased pain.  No significant lower  extremity swelling.  She does continue to report some intermittent abdominal fullness without early satiety.  No progressive orthopnea.  With labs last month showing a slight increase in her renal function, she was advised to take torsemide  20 mg daily with an extra dose as needed for weight gain.  She reports that  she has struggled with weighing herself daily and typically takes an extra dose of torsemide  based on abdominal fullness, she has taken an additional dose the past 3 days.  No falls or symptoms concerning for bleeding.   Labs independently reviewed: 08/2024 - BUN 39, serum creatinine 1.37, potassium 4.8 03/2024 - TSH normal, TC 129, TG 255, HDL 60, LDL 31, albumin 4.6, AST/ALT normal, A1c 7.4, Hgb 12.2, PLT 338   Past Medical History:  Diagnosis Date   Anxiety    Depression    Elevated BP    Hypercholesterolemia    Increased BMI    Menopause    Morbid obesity (HCC)    Type I diabetes mellitus (HCC)     Past Surgical History:  Procedure Laterality Date   BREAST BIOPSY Left 05/09/2020   ribbon clip, stereo bx, HEMANGIOMA, BENIGN. - MAMMARY EPITHELIUM IS NOT PRESENT.   CATARACT EXTRACTION W/PHACO Left 11/08/2020   Procedure: CATARACT EXTRACTION PHACO AND INTRAOCULAR LENS PLACEMENT (IOC) LEFT DIABETIC 4.14 00:52.1 ;  Surgeon: Jaye Fallow, MD;  Location: [facility];  Service: Ophthalmology;  Laterality: Left;   CATARACT EXTRACTION W/PHACO Right 12/04/2020   Procedure: CATARACT EXTRACTION PHACO AND INTRAOCULAR LENS PLACEMENT (IOC) RIGHT DIABETIC 6.11 00:46.1;  Surgeon: Jaye Fallow, MD;  Location: [facility];  Service: Ophthalmology;  Laterality: Right;  Diabetic - insulin    CESAREAN SECTION  1997   COLONOSCOPY WITH PROPOFOL  N/A 01/18/2021   Procedure: COLONOSCOPY WITH PROPOFOL ;  Surgeon: Jinny Carmine, MD;  Location: [facility];  Service: Endoscopy;  Laterality: N/A;  priority 4    Current Medications: Current Meds  Medication Sig   BAYER MICROLET LANCETS lancets USE AS DIRECTED TO CHECK  SUGARS 1 TO 2 TIMES A DAY   cholecalciferol (VITAMIN D3) 25 MCG (1000 UT) tablet Take 1,000 Units by mouth daily.   CONTOUR NEXT TEST test strip USE TO CHECK BLOOD GLUCOSE 6 TO  7 TIMES DAILY   Cyanocobalamin  (B-12 PO) Take by mouth.   insulin  lispro (HUMALOG   KWIKPEN) 100 UNIT/ML KwikPen INJECT SUBCUTANEOUSLY 65 UNITS  DAILY (DISTRIBUTED OVER THREE  MEALS COUNTING CARBS)   Insulin  Pen Needle 32G X 4 MM MISC Use as directed with insulin  4 x daily.   LANTUS  SOLOSTAR 100 UNIT/ML Solostar Pen INJECT SUBCUTANEOUSLY 80 UNITS  AT NIGHT   levocetirizine (XYZAL ) 5 MG tablet TAKE 1 TABLET BY MOUTH IN THE  EVENING   lisinopril  (ZESTRIL ) 40 MG tablet TAKE 1 TABLET BY MOUTH DAILY   potassium chloride  (KLOR-CON ) 10 MEQ tablet Take 1 tablet (10 mEq total) by mouth 2 (two) times daily.   REPATHA  SURECLICK 140 MG/ML SOAJ INJECT 1 PEN SUBCUTANEOUSLY  EVERY 2 WEEKS   sertraline  (ZOLOFT ) 50 MG tablet TAKE 1 TABLET BY MOUTH DAILY   [DISCONTINUED] carvedilol  (COREG ) 12.5 MG tablet Take 1 tablet (12.5 mg total) by mouth 2 (two) times daily.   [DISCONTINUED] torsemide  (DEMADEX ) 20 MG tablet Take 1 tablet (20 mg total) by mouth daily. can take an extra dose if needed for significant weight gain (2 pounds in 24 hours or 5 pounds in 1 week)    Allergies:   Codeine   Social History  Socioeconomic History   Marital status: Single    Spouse name: Not on file   Number of children: Not on file   Years of education: Not on file   Highest education level: 12th grade  Occupational History   Not on file  Tobacco Use   Smoking status: Former    Current packs/day: 0.50    Average packs/day: 0.5 packs/day for 20.0 years (10.0 ttl pk-yrs)    Types: Cigarettes   Smokeless tobacco: Never  Substance and Sexual Activity   Alcohol use: No    Alcohol/week: 0.0 standard drinks of alcohol   Drug use: No   Sexual activity: Not Currently    Birth control/protection: Post-menopausal  Other Topics Concern   Not on file  Social History Narrative   Not on file   Social Drivers of Health   Financial Resource Strain: Low Risk  (06/08/2024)   Received from [facility]   Overall Financial Resource Strain (CARDIA)    Difficulty of Paying Living Expenses: Not hard at  all  Food Insecurity: No Food Insecurity (06/08/2024)   Received from [facility]   Hunger Vital Sign    Within the past 12 months, you worried that your food would run out before you got the money to buy more.: Never true    Within the past 12 months, the food you bought just didn't last and you didn't have money to get more.: Never true  Transportation Needs: No Transportation Needs (06/08/2024)   Received from [facility] - Transportation    In the past 12 months, has lack of transportation kept you from medical appointments or from getting medications?: No    Lack of Transportation (Non-Medical): No  Physical Activity: Inactive (05/12/2024)   Exercise Vital Sign    Days of Exercise per Week: 0 days    Minutes of Exercise per Session: Not on file  Stress: Stress Concern Present (05/12/2024)   [employer] of Occupational Health - Occupational Stress Questionnaire    Feeling of Stress: To some extent  Social Connections: Moderately Integrated (05/12/2024)   Social Connection and Isolation Panel    Frequency of Communication with Friends and Family: More than three times a week    Frequency of Social Gatherings with Friends and Family: Once a week    Attends Religious Services: More than 4 times per year    Active Member of [employer] or Organizations: Yes    Attends Engineer, structural: More than 4 times per year    Marital Status: Divorced     Family History:  The patient's family history includes Breast cancer in her maternal aunt and maternal grandmother; Cancer in her father; Colon cancer in her maternal grandfather and paternal grandfather; Colon polyps in her mother; Diabetes in her maternal grandfather; Hyperlipidemia in her mother; Hypertension in her mother. There is no history of Heart disease or Ovarian cancer.  ROS:   12-point review of systems is negative unless otherwise noted in the HPI.   EKGs/Labs/Other Studies  Reviewed:    Studies reviewed were summarized above. The additional studies were reviewed today:  Myocardial PET/CT 08/25/2024:   Findings are consistent with apical ischemia. The study is intermediate risk due to medium size area of ischemia, and mildly reduced MBFR.   LV perfusion is abnormal. There is evidence of ischemia. There is no evidence of infarction. Defect 1: There is a medium defect with moderate reduction in uptake present in  the apical anterior and apex location(s) that is reversible. There is abnormal wall motion in the defect area. Consistent with ischemia.   Rest left ventricular function is normal. Rest EF: 57%. Stress left ventricular function is normal. Stress EF: 62%. End diastolic cavity size is normal. End systolic cavity size is normal. No evidence of transient ischemic dilation (TID) noted.   Myocardial blood flow was computed to be 0.71ml/g/min at rest and 1.21ml/g/min at stress. Global myocardial blood flow reserve was 1.70 and was mildly abnormal.   Coronary calcium  was present on the attenuation correction CT images. Mild coronary calcifications were present. Coronary calcifications were present in the left anterior descending artery and left circumflex artery distribution(s). __________  2D echo 01/05/2024: 1. Left ventricular ejection fraction, by estimation, is 55 to 60%. The  left ventricle has normal function. Left ventricular endocardial border  not optimally defined to evaluate regional wall motion. The left  ventricular internal cavity size was mildly  dilated. There is mild left ventricular hypertrophy. Left ventricular  diastolic parameters are consistent with Grade II diastolic dysfunction  (pseudonormalization).   2. Right ventricular systolic function is normal. The right ventricular  size is normal.   3. Left atrial size was mildly dilated.   4. The mitral valve is grossly normal. Mild mitral valve regurgitation.  No evidence of mitral stenosis.   5.  The aortic valve is tricuspid. Aortic valve regurgitation is not  visualized. No aortic stenosis is present.    EKG:  EKG is ordered today.  The EKG ordered today demonstrates NSR, 70 bpm, low voltage QRS, baseline wandering and artifact, nonspecific st/t changes  Recent Labs: 01/03/2024: B Natriuretic Peptide 239.2 01/10/2024: Magnesium  2.3 04/27/2024: ALT 31; Hemoglobin 12.2; Platelets 338; TSH 2.010 08/17/2024: BUN 39; Creatinine, Ser 1.37; Potassium 4.8; Sodium 136  Recent Lipid Panel    Component Value Date/Time   CHOL 129 04/27/2024 1226   TRIG 255 (H) 04/27/2024 1226   HDL 60 04/27/2024 1226   CHOLHDL 2.2 04/27/2024 1226   CHOLHDL 2.1 01/03/2024 2036   VLDL 17 01/03/2024 2036   LDLCALC 31 04/27/2024 1226   LDLDIRECT 32 01/03/2024 2036    PHYSICAL EXAM:    VS:  BP (!) 137/50 (BP Location: Right Arm, Patient Position: Sitting, Cuff Size: Large)   Pulse 70   Ht 5' 4 (1.626 m)   Wt 271 lb 9.6 oz (123.2 kg)   SpO2 95%   BMI 46.62 kg/m   BMI: Body mass index is 46.62 kg/m.  Physical Exam Vitals reviewed.  Constitutional:      Appearance: She is well-developed.  HENT:     Head: Normocephalic and atraumatic.  Eyes:     General:        Right eye: No discharge.        Left eye: No discharge.  Cardiovascular:     Rate and Rhythm: Normal rate and regular rhythm.     Heart sounds: Normal heart sounds, S1 normal and S2 normal. Heart sounds not distant. No midsystolic click and no opening snap. No murmur heard.    No friction rub.  Pulmonary:     Effort: Pulmonary effort is normal. No respiratory distress.     Breath sounds: Normal breath sounds. No decreased breath sounds, wheezing, rhonchi or rales.  Musculoskeletal:     Cervical back: Normal range of motion.     Comments: Trivial bilateral pretibial edema.  Skin:    General: Skin is warm and dry.  Nails: There is no clubbing.  Neurological:     Mental Status: She is alert and oriented to person, place, and time.   Psychiatric:        Speech: Speech normal.        Behavior: Behavior normal.        Thought Content: Thought content normal.        Judgment: Judgment normal.     Wt Readings from Last 3 Encounters:  09/05/24 271 lb 9.6 oz (123.2 kg)  08/25/24 269 lb (122 kg)  07/20/24 281 lb 3.2 oz (127.6 kg)     ASSESSMENT & PLAN:   CAD involving the native coronary arteries with exertional dyspnea and abnormal myocardial PET/CT: Currently without symptoms of angina or cardiac decompensation.  Recent myocardial PET/CT abnormal with reversible perfusion defect involving the apical anterior and apex locations with wall motion abnormality in the defect area.  Normal LV systolic function.  We will proceed with diagnostic R/LHC to evaluate for obstructive CAD and to better assess her hemodynamics to help guide pharmacotherapy.  Precath, start aspirin  81 mg.  Continue Repatha .  LDL 31 in 03/2024.  HFpEF and lower extremity edema: Appears largely euvolemic.  We will proceed with RHC to better assess hemodynamic status as outlined above.  For now, continue torsemide  20 mg daily with an additional 20 mg as needed for shortness of breath or weight gain.  Defer addition of SGLT2 inhibitor given type I diabetic.  Consider addition of MRA post cath.  Updated BMP with recent mild renal dysfunction.  HTN: Blood pressure is reasonably controlled in the office today.  She remains on carvedilol  12.5 mg twice daily and lisinopril  40 mg.  Low-sodium diet is recommended.  Obesity: Weight loss is encouraged through heart healthy diet and regular exercise which is currently limited by bilateral knee pain.   Informed Consent   Shared Decision Making/Informed Consent{  The risks [stroke (1 in 1000), death (1 in 1000), kidney failure [usually temporary] (1 in 500), bleeding (1 in 200), allergic reaction [possibly serious] (1 in 200)], benefits (diagnostic support and management of coronary artery disease) and alternatives of a  cardiac catheterization were discussed in detail with Ms. Mione and she is willing to proceed.        Disposition: F/u with Dr. Mady or an APP in 2-3 weeks.   Medication Adjustments/Labs and Tests Ordered: Current medicines are reviewed at length with the patient today.  Concerns regarding medicines are outlined above. Medication changes, Labs and Tests ordered today are summarized above and listed in the Patient Instructions accessible in Encounters.   Signed, Bernardino Bring, PA-C 09/05/2024 12:55 PM     [facility] -  [address] [place], [place] [address] [phone]

## 2024-09-05 NOTE — H&P (View-Only) (Signed)
 Cardiology Office Note    Date:  09/05/2024   ID:  DONN WILMOT, DOB 03/11/1961, MRN [id number]  PCP:  Glendia Shad, MD  Cardiologist:  [name], MD  Electrophysiologist:  None   Chief Complaint: Follow-up  History of Present Illness:   Jill Crosby is a 63 y.o. female with history of coronary artery calcification, HFpEF, type 1 diabetes, HTN, HLD, and morbid obesity who presents for follow-up of myocardial PET/CT.   She was evaluated as a new patient by Dr. Hanson on 05/18/2024 for evaluation of progressive weight gain and swelling.  She had previously undergone an echo in 01/2024 during a hospital admission for altered mental status in the setting of severe hyponatremia and influenza that showed an EF of 55 to 60%, mildly dilated LV internal cavity size, mild LVH, grade 2 diastolic dysfunction, normal RV systolic function and ventricular cavity size, mildly dilated left atrium, mild mitral regurgitation, and a tricuspid aortic valve.  At the time of her visit in 05/2024, she reported a 17 pound weight gain over the preceding 6 weeks and was using compression stockings with modest improvement.  She had also been started on furosemide  20 mg daily as needed earlier that month that she was taking on a regular basis.  She reported having previously been on hydrochlorothiazide  leading up to her admission in 01/2024, though this had not been restarted given concern that it may have contributed to her significant hyponatremia (of note she also had a severe GI illness shortly before her admission and was only drinking water ).  It was felt her lower extremity edema was likely multifactorial with a degree of HFpEF possibly contributing given diastolic dysfunction noted on echo in 01/2024.  Furosemide  was titrated to 40 mg daily with recommendation to restrict excess fluid and sodium.  She followed up in the office on 06/22/2024 with her weight being up another 13 pounds today when compared to her  visit in 05/2024.  She continued to note lower extremity swelling that was typically improved early in the morning and 1 progressive throughout the day.  She reported good urine output with furosemide  for 2 to 3 hours.  She also noted her lower extremity swelling became more pronounced after resuming work (works from home with her feet hanging down from the chair most of the day).  She did report wearing compression socks.  Labs obtained at that time showed stable renal function and electrolytes with recommendation for her to titrate furosemide  to 40 mg twice daily.  At follow-up in 05/2024, amlodipine  was discontinued and furosemide  was transitioned to torsemide .  She was last seen in the office on 07/20/2024 with improvement in lower extremity swelling following the above changes.  She was without chest pain or shortness of breath, though did report a couple episodes of paroxysmal nocturnal dyspnea.  Subsequent Myocardial PET/CT on 08/25/2024 was intermediate risk with a medium defect with moderate reduction in uptake present in the apical anterior and apex location that was reversible with abnormal wall motion in the defect area consistent with ischemia.  LVEF normal.  Coronary artery calcification present in the LAD and LCx distributions.  She comes in accompanied by her sister today and is doing well from a cardiac perspective, currently without symptoms of angina or cardiac decompensation.  She has been without further episodes of PND.  No frank angina.  She does report some exertional shortness of breath, though has attributed this to bilateral knee discomfort/increased pain.  No significant lower  extremity swelling.  She does continue to report some intermittent abdominal fullness without early satiety.  No progressive orthopnea.  With labs last month showing a slight increase in her renal function, she was advised to take torsemide  20 mg daily with an extra dose as needed for weight gain.  She reports that  she has struggled with weighing herself daily and typically takes an extra dose of torsemide  based on abdominal fullness, she has taken an additional dose the past 3 days.  No falls or symptoms concerning for bleeding.   Labs independently reviewed: 08/2024 - BUN 39, serum creatinine 1.37, potassium 4.8 03/2024 - TSH normal, TC 129, TG 255, HDL 60, LDL 31, albumin 4.6, AST/ALT normal, A1c 7.4, Hgb 12.2, PLT 338   Past Medical History:  Diagnosis Date   Anxiety    Depression    Elevated BP    Hypercholesterolemia    Increased BMI    Menopause    Morbid obesity (HCC)    Type I diabetes mellitus (HCC)     Past Surgical History:  Procedure Laterality Date   BREAST BIOPSY Left 05/09/2020   ribbon clip, stereo bx, HEMANGIOMA, BENIGN. - MAMMARY EPITHELIUM IS NOT PRESENT.   CATARACT EXTRACTION W/PHACO Left 11/08/2020   Procedure: CATARACT EXTRACTION PHACO AND INTRAOCULAR LENS PLACEMENT (IOC) LEFT DIABETIC 4.14 00:52.1 ;  Surgeon: Jaye Fallow, MD;  Location: [facility];  Service: Ophthalmology;  Laterality: Left;   CATARACT EXTRACTION W/PHACO Right 12/04/2020   Procedure: CATARACT EXTRACTION PHACO AND INTRAOCULAR LENS PLACEMENT (IOC) RIGHT DIABETIC 6.11 00:46.1;  Surgeon: Jaye Fallow, MD;  Location: [facility];  Service: Ophthalmology;  Laterality: Right;  Diabetic - insulin    CESAREAN SECTION  1997   COLONOSCOPY WITH PROPOFOL  N/A 01/18/2021   Procedure: COLONOSCOPY WITH PROPOFOL ;  Surgeon: Jinny Carmine, MD;  Location: [facility];  Service: Endoscopy;  Laterality: N/A;  priority 4    Current Medications: Current Meds  Medication Sig   BAYER MICROLET LANCETS lancets USE AS DIRECTED TO CHECK  SUGARS 1 TO 2 TIMES A DAY   cholecalciferol (VITAMIN D3) 25 MCG (1000 UT) tablet Take 1,000 Units by mouth daily.   CONTOUR NEXT TEST test strip USE TO CHECK BLOOD GLUCOSE 6 TO  7 TIMES DAILY   Cyanocobalamin  (B-12 PO) Take by mouth.   insulin  lispro (HUMALOG   KWIKPEN) 100 UNIT/ML KwikPen INJECT SUBCUTANEOUSLY 65 UNITS  DAILY (DISTRIBUTED OVER THREE  MEALS COUNTING CARBS)   Insulin  Pen Needle 32G X 4 MM MISC Use as directed with insulin  4 x daily.   LANTUS  SOLOSTAR 100 UNIT/ML Solostar Pen INJECT SUBCUTANEOUSLY 80 UNITS  AT NIGHT   levocetirizine (XYZAL ) 5 MG tablet TAKE 1 TABLET BY MOUTH IN THE  EVENING   lisinopril  (ZESTRIL ) 40 MG tablet TAKE 1 TABLET BY MOUTH DAILY   potassium chloride  (KLOR-CON ) 10 MEQ tablet Take 1 tablet (10 mEq total) by mouth 2 (two) times daily.   REPATHA  SURECLICK 140 MG/ML SOAJ INJECT 1 PEN SUBCUTANEOUSLY  EVERY 2 WEEKS   sertraline  (ZOLOFT ) 50 MG tablet TAKE 1 TABLET BY MOUTH DAILY   [DISCONTINUED] carvedilol  (COREG ) 12.5 MG tablet Take 1 tablet (12.5 mg total) by mouth 2 (two) times daily.   [DISCONTINUED] torsemide  (DEMADEX ) 20 MG tablet Take 1 tablet (20 mg total) by mouth daily. can take an extra dose if needed for significant weight gain (2 pounds in 24 hours or 5 pounds in 1 week)    Allergies:   Codeine   Social History  Socioeconomic History   Marital status: Single    Spouse name: Not on file   Number of children: Not on file   Years of education: Not on file   Highest education level: 12th grade  Occupational History   Not on file  Tobacco Use   Smoking status: Former    Current packs/day: 0.50    Average packs/day: 0.5 packs/day for 20.0 years (10.0 ttl pk-yrs)    Types: Cigarettes   Smokeless tobacco: Never  Substance and Sexual Activity   Alcohol use: No    Alcohol/week: 0.0 standard drinks of alcohol   Drug use: No   Sexual activity: Not Currently    Birth control/protection: Post-menopausal  Other Topics Concern   Not on file  Social History Narrative   Not on file   Social Drivers of Health   Financial Resource Strain: Low Risk  (06/08/2024)   Received from [facility]   Overall Financial Resource Strain (CARDIA)    Difficulty of Paying Living Expenses: Not hard at  all  Food Insecurity: No Food Insecurity (06/08/2024)   Received from [facility]   Hunger Vital Sign    Within the past 12 months, you worried that your food would run out before you got the money to buy more.: Never true    Within the past 12 months, the food you bought just didn't last and you didn't have money to get more.: Never true  Transportation Needs: No Transportation Needs (06/08/2024)   Received from [facility] - Transportation    In the past 12 months, has lack of transportation kept you from medical appointments or from getting medications?: No    Lack of Transportation (Non-Medical): No  Physical Activity: Inactive (05/12/2024)   Exercise Vital Sign    Days of Exercise per Week: 0 days    Minutes of Exercise per Session: Not on file  Stress: Stress Concern Present (05/12/2024)   [employer] of Occupational Health - Occupational Stress Questionnaire    Feeling of Stress: To some extent  Social Connections: Moderately Integrated (05/12/2024)   Social Connection and Isolation Panel    Frequency of Communication with Friends and Family: More than three times a week    Frequency of Social Gatherings with Friends and Family: Once a week    Attends Religious Services: More than 4 times per year    Active Member of [employer] or Organizations: Yes    Attends Engineer, structural: More than 4 times per year    Marital Status: Divorced     Family History:  The patient's family history includes Breast cancer in her maternal aunt and maternal grandmother; Cancer in her father; Colon cancer in her maternal grandfather and paternal grandfather; Colon polyps in her mother; Diabetes in her maternal grandfather; Hyperlipidemia in her mother; Hypertension in her mother. There is no history of Heart disease or Ovarian cancer.  ROS:   12-point review of systems is negative unless otherwise noted in the HPI.   EKGs/Labs/Other Studies  Reviewed:    Studies reviewed were summarized above. The additional studies were reviewed today:  Myocardial PET/CT 08/25/2024:   Findings are consistent with apical ischemia. The study is intermediate risk due to medium size area of ischemia, and mildly reduced MBFR.   LV perfusion is abnormal. There is evidence of ischemia. There is no evidence of infarction. Defect 1: There is a medium defect with moderate reduction in uptake present in  the apical anterior and apex location(s) that is reversible. There is abnormal wall motion in the defect area. Consistent with ischemia.   Rest left ventricular function is normal. Rest EF: 57%. Stress left ventricular function is normal. Stress EF: 62%. End diastolic cavity size is normal. End systolic cavity size is normal. No evidence of transient ischemic dilation (TID) noted.   Myocardial blood flow was computed to be 0.71ml/g/min at rest and 1.21ml/g/min at stress. Global myocardial blood flow reserve was 1.70 and was mildly abnormal.   Coronary calcium  was present on the attenuation correction CT images. Mild coronary calcifications were present. Coronary calcifications were present in the left anterior descending artery and left circumflex artery distribution(s). __________  2D echo 01/05/2024: 1. Left ventricular ejection fraction, by estimation, is 55 to 60%. The  left ventricle has normal function. Left ventricular endocardial border  not optimally defined to evaluate regional wall motion. The left  ventricular internal cavity size was mildly  dilated. There is mild left ventricular hypertrophy. Left ventricular  diastolic parameters are consistent with Grade II diastolic dysfunction  (pseudonormalization).   2. Right ventricular systolic function is normal. The right ventricular  size is normal.   3. Left atrial size was mildly dilated.   4. The mitral valve is grossly normal. Mild mitral valve regurgitation.  No evidence of mitral stenosis.   5.  The aortic valve is tricuspid. Aortic valve regurgitation is not  visualized. No aortic stenosis is present.    EKG:  EKG is ordered today.  The EKG ordered today demonstrates NSR, 70 bpm, low voltage QRS, baseline wandering and artifact, nonspecific st/t changes  Recent Labs: 01/03/2024: B Natriuretic Peptide 239.2 01/10/2024: Magnesium  2.3 04/27/2024: ALT 31; Hemoglobin 12.2; Platelets 338; TSH 2.010 08/17/2024: BUN 39; Creatinine, Ser 1.37; Potassium 4.8; Sodium 136  Recent Lipid Panel    Component Value Date/Time   CHOL 129 04/27/2024 1226   TRIG 255 (H) 04/27/2024 1226   HDL 60 04/27/2024 1226   CHOLHDL 2.2 04/27/2024 1226   CHOLHDL 2.1 01/03/2024 2036   VLDL 17 01/03/2024 2036   LDLCALC 31 04/27/2024 1226   LDLDIRECT 32 01/03/2024 2036    PHYSICAL EXAM:    VS:  BP (!) 137/50 (BP Location: Right Arm, Patient Position: Sitting, Cuff Size: Large)   Pulse 70   Ht 5' 4 (1.626 m)   Wt 271 lb 9.6 oz (123.2 kg)   SpO2 95%   BMI 46.62 kg/m   BMI: Body mass index is 46.62 kg/m.  Physical Exam Vitals reviewed.  Constitutional:      Appearance: She is well-developed.  HENT:     Head: Normocephalic and atraumatic.  Eyes:     General:        Right eye: No discharge.        Left eye: No discharge.  Cardiovascular:     Rate and Rhythm: Normal rate and regular rhythm.     Heart sounds: Normal heart sounds, S1 normal and S2 normal. Heart sounds not distant. No midsystolic click and no opening snap. No murmur heard.    No friction rub.  Pulmonary:     Effort: Pulmonary effort is normal. No respiratory distress.     Breath sounds: Normal breath sounds. No decreased breath sounds, wheezing, rhonchi or rales.  Musculoskeletal:     Cervical back: Normal range of motion.     Comments: Trivial bilateral pretibial edema.  Skin:    General: Skin is warm and dry.  Nails: There is no clubbing.  Neurological:     Mental Status: She is alert and oriented to person, place, and time.   Psychiatric:        Speech: Speech normal.        Behavior: Behavior normal.        Thought Content: Thought content normal.        Judgment: Judgment normal.     Wt Readings from Last 3 Encounters:  09/05/24 271 lb 9.6 oz (123.2 kg)  08/25/24 269 lb (122 kg)  07/20/24 281 lb 3.2 oz (127.6 kg)     ASSESSMENT & PLAN:   CAD involving the native coronary arteries with exertional dyspnea and abnormal myocardial PET/CT: Currently without symptoms of angina or cardiac decompensation.  Recent myocardial PET/CT abnormal with reversible perfusion defect involving the apical anterior and apex locations with wall motion abnormality in the defect area.  Normal LV systolic function.  We will proceed with diagnostic R/LHC to evaluate for obstructive CAD and to better assess her hemodynamics to help guide pharmacotherapy.  Precath, start aspirin  81 mg.  Continue Repatha .  LDL 31 in 03/2024.  HFpEF and lower extremity edema: Appears largely euvolemic.  We will proceed with RHC to better assess hemodynamic status as outlined above.  For now, continue torsemide  20 mg daily with an additional 20 mg as needed for shortness of breath or weight gain.  Defer addition of SGLT2 inhibitor given type I diabetic.  Consider addition of MRA post cath.  Updated BMP with recent mild renal dysfunction.  HTN: Blood pressure is reasonably controlled in the office today.  She remains on carvedilol  12.5 mg twice daily and lisinopril  40 mg.  Low-sodium diet is recommended.  Obesity: Weight loss is encouraged through heart healthy diet and regular exercise which is currently limited by bilateral knee pain.   Informed Consent   Shared Decision Making/Informed Consent{  The risks [stroke (1 in 1000), death (1 in 1000), kidney failure [usually temporary] (1 in 500), bleeding (1 in 200), allergic reaction [possibly serious] (1 in 200)], benefits (diagnostic support and management of coronary artery disease) and alternatives of a  cardiac catheterization were discussed in detail with Ms. Mione and she is willing to proceed.        Disposition: F/u with Dr. Mady or an APP in 2-3 weeks.   Medication Adjustments/Labs and Tests Ordered: Current medicines are reviewed at length with the patient today.  Concerns regarding medicines are outlined above. Medication changes, Labs and Tests ordered today are summarized above and listed in the Patient Instructions accessible in Encounters.   Signed, Bernardino Bring, PA-C 09/05/2024 12:55 PM     [facility] -  [address] [place], [place] [address] [phone]

## 2024-09-05 NOTE — Patient Instructions (Signed)
 Medication Instructions:  Your physician recommends that you continue on your current medications as directed. Please refer to the Current Medication list given to you today.   *If you need a refill on your cardiac medications before your next appointment, please call your pharmacy*  Lab Work: Your provider would like for you to have following labs drawn today CBC and BMeT.   If you have labs (blood work) drawn today and your tests are completely normal, you will receive your results only by: MyChart Message (if you have MyChart) OR A paper copy in the mail If you have any lab test that is abnormal or we need to change your treatment, we will call you to review the results.  Testing/Procedures:  [place] A DEPT OF [place]. [facility] [place] [address] [place] [address] Dept: [phone] Loc: [phone]  Jill Crosby  09/05/2024  You are scheduled for a Cardiac Catheterization on Tuesday, October 14 with Dr. Lonni End.  1. Please arrive at the [facility] Entrance of [facility], [address], [place] [address] at 8:30 AM (This is 1 hour(s) prior to your procedure time).  Proceed to the Check-In Desk directly inside the entrance.  Procedure Parking: Use the entrance off of the [address] side of the hospital. Turn right upon entering and follow the driveway to parking that is directly in front of the [facility] Vascular Center. There is no valet parking available at this entrance, however there is an awning directly in front of the Heart & Vascular Center for drop off/ pick up for patients.  Special note: Every effort is made to have your procedure done on time. Please understand that emergencies sometimes delay scheduled procedures.  2. Diet: Nothing to eat after midnight.   3. Hydration: You need to be well hydrated before your procedure. On October 14, you may drink  approved liquids (see below) until 2 hours before the procedure, with 16 oz of water  as your last intake.   List of approved liquids water , clear juice, clear tea, black coffee, fruit juices, non-citric and without pulp, carbonated beverages, Gatorade, Kool -Aid, plain Jello-O and plain ice popsicles.  4. Labs: You will need to have blood drawn today.  5. Medication instructions in preparation for your procedure:   Contrast Allergy: No  Stop taking, Torsemide  (Demadex ) Tuesday, October 14,  Take only 40 units of insulin  the night before your procedure. Do not take any insulin  on the day of the procedure.  6. Plan to go home the same day, you will only stay overnight if medically necessary. 7. Bring a current list of your medications and current insurance cards. 8. You MUST have a responsible person to drive you home. 9. Someone MUST be with you the first 24 hours after you arrive home or your discharge will be delayed. 10. Please wear clothes that are easy to get on and off and wear slip-on shoes.  Thank you for allowing us  to care for you!   --  Invasive Cardiovascular services   Follow-Up: At [facility], you and your health needs are our priority.  As part of our continuing mission to provide you with exceptional heart care, our providers are all part of one team.  This team includes your primary Cardiologist (physician) and Advanced Practice Providers or APPs (Physician Assistants and Nurse Practitioners) who all work together to provide you with the care  you need, when you need it.  Your next appointment:   2 week(s)  Provider:   You may see [name], MD or Bernardino Bring, PA-C

## 2024-09-06 ENCOUNTER — Ambulatory Visit: Payer: Self-pay | Admitting: Physician Assistant

## 2024-09-06 LAB — BASIC METABOLIC PANEL WITH GFR
BUN/Creatinine Ratio: 15 (ref 12–28)
BUN: 15 mg/dL (ref 8–27)
CO2: 28 mmol/L (ref 20–29)
Calcium: 9.6 mg/dL (ref 8.7–10.3)
Chloride: 97 mmol/L (ref 96–106)
Creatinine, Ser: 1.02 mg/dL — ABNORMAL HIGH (ref 0.57–1.00)
Glucose: 137 mg/dL — ABNORMAL HIGH (ref 70–99)
Potassium: 3.7 mmol/L (ref 3.5–5.2)
Sodium: 138 mmol/L (ref 134–144)
eGFR: 61 mL/min/1.73 (ref >59)

## 2024-09-06 LAB — CBC
Hematocrit: 38.2 % (ref 34.0–46.6)
Hemoglobin: 11.7 g/dL (ref 11.1–15.9)
MCH: 27.5 pg (ref 26.6–33.0)
MCHC: 30.6 g/dL — ABNORMAL LOW (ref 31.5–35.7)
MCV: 90 fL (ref 79–97)
Platelets: 286 x10E3/uL (ref 150–450)
RBC: 4.25 x10E6/uL (ref 3.77–5.28)
RDW: 14.4 % (ref 11.7–15.4)
WBC: 10.3 x10E3/uL (ref 3.4–10.8)

## 2024-09-09 ENCOUNTER — Telehealth: Payer: Self-pay | Admitting: Internal Medicine

## 2024-09-09 NOTE — Telephone Encounter (Signed)
 Call pt , that we have received the papers fro Short Term Disability and asked her to come and file them out. Bring $29 in cash too.

## 2024-09-13 ENCOUNTER — Other Ambulatory Visit: Payer: Self-pay

## 2024-09-13 ENCOUNTER — Encounter: Admission: RE | Disposition: A | Payer: Self-pay | Source: Home / Self Care | Attending: Internal Medicine

## 2024-09-13 ENCOUNTER — Observation Stay
Admission: RE | Admit: 2024-09-13 | Discharge: 2024-09-14 | Disposition: A | Discharge status: Home / Self Care | Attending: Internal Medicine | Admitting: Internal Medicine

## 2024-09-13 ENCOUNTER — Encounter: Payer: Self-pay | Admitting: Internal Medicine

## 2024-09-13 DIAGNOSIS — Z23 Encounter for immunization: Secondary | ICD-10-CM | POA: Diagnosis not present

## 2024-09-13 DIAGNOSIS — Z6841 Body Mass Index (BMI) 40.0 and over, adult: Secondary | ICD-10-CM | POA: Insufficient documentation

## 2024-09-13 DIAGNOSIS — I2510 Atherosclerotic heart disease of native coronary artery without angina pectoris: Secondary | ICD-10-CM | POA: Diagnosis present

## 2024-09-13 DIAGNOSIS — I25118 Atherosclerotic heart disease of native coronary artery with other forms of angina pectoris: Secondary | ICD-10-CM

## 2024-09-13 DIAGNOSIS — Z87891 Personal history of nicotine dependence: Secondary | ICD-10-CM | POA: Insufficient documentation

## 2024-09-13 DIAGNOSIS — Z794 Long term (current) use of insulin: Secondary | ICD-10-CM | POA: Insufficient documentation

## 2024-09-13 DIAGNOSIS — I110 Hypertensive heart disease with heart failure: Secondary | ICD-10-CM | POA: Insufficient documentation

## 2024-09-13 DIAGNOSIS — R9439 Abnormal result of other cardiovascular function study: Secondary | ICD-10-CM | POA: Diagnosis not present

## 2024-09-13 DIAGNOSIS — Z955 Presence of coronary angioplasty implant and graft: Secondary | ICD-10-CM

## 2024-09-13 DIAGNOSIS — E109 Type 1 diabetes mellitus without complications: Secondary | ICD-10-CM | POA: Diagnosis not present

## 2024-09-13 DIAGNOSIS — Z780 Asymptomatic menopausal state: Secondary | ICD-10-CM | POA: Insufficient documentation

## 2024-09-13 DIAGNOSIS — Z7982 Long term (current) use of aspirin: Secondary | ICD-10-CM | POA: Insufficient documentation

## 2024-09-13 DIAGNOSIS — I5032 Chronic diastolic (congestive) heart failure: Secondary | ICD-10-CM | POA: Insufficient documentation

## 2024-09-13 DIAGNOSIS — R9430 Abnormal result of cardiovascular function study, unspecified: Secondary | ICD-10-CM | POA: Insufficient documentation

## 2024-09-13 DIAGNOSIS — R600 Localized edema: Secondary | ICD-10-CM | POA: Diagnosis not present

## 2024-09-13 DIAGNOSIS — Z7902 Long term (current) use of antithrombotics/antiplatelets: Secondary | ICD-10-CM | POA: Insufficient documentation

## 2024-09-13 HISTORY — PX: CORONARY STENT INTERVENTION: CATH118234

## 2024-09-13 HISTORY — PX: RIGHT/LEFT HEART CATH AND CORONARY ANGIOGRAPHY: CATH118266

## 2024-09-13 LAB — POCT I-STAT 7, (LYTES, BLD GAS, ICA,H+H)
Acid-Base Excess: 3 mmol/L — ABNORMAL HIGH (ref 0.0–2.0)
Bicarbonate: 28.1 mmol/L — ABNORMAL HIGH (ref 20.0–28.0)
Calcium, Ion: 1.23 mmol/L (ref 1.15–1.40)
HCT: 36 % (ref 36.0–46.0)
Hemoglobin: 12.2 g/dL (ref 12.0–15.0)
O2 Saturation: 96 %
Potassium: 4 mmol/L (ref 3.5–5.1)
Sodium: 136 mmol/L (ref 135–145)
TCO2: 29 mmol/L (ref 22–32)
pCO2 arterial: 42.4 mmHg (ref 32–48)
pH, Arterial: 7.43 (ref 7.35–7.45)
pO2, Arterial: 81 mmHg — ABNORMAL LOW (ref 83–108)

## 2024-09-13 LAB — GLUCOSE, CAPILLARY
Glucose-Capillary: 152 mg/dL — ABNORMAL HIGH (ref 70–99)
Glucose-Capillary: 185 mg/dL — ABNORMAL HIGH (ref 70–99)
Glucose-Capillary: 305 mg/dL — ABNORMAL HIGH (ref 70–99)
Glucose-Capillary: 325 mg/dL — ABNORMAL HIGH (ref 70–99)

## 2024-09-13 LAB — POCT I-STAT EG7
Acid-Base Excess: 4 mmol/L — ABNORMAL HIGH (ref 0.0–2.0)
Bicarbonate: 29.6 mmol/L — ABNORMAL HIGH (ref 20.0–28.0)
Calcium, Ion: 1.21 mmol/L (ref 1.15–1.40)
HCT: 35 % — ABNORMAL LOW (ref 36.0–46.0)
Hemoglobin: 11.9 g/dL — ABNORMAL LOW (ref 12.0–15.0)
O2 Saturation: 65 %
Potassium: 3.9 mmol/L (ref 3.5–5.1)
Sodium: 138 mmol/L (ref 135–145)
TCO2: 31 mmol/L (ref 22–32)
pCO2, Ven: 48 mmHg (ref 44–60)
pH, Ven: 7.399 (ref 7.25–7.43)
pO2, Ven: 34 mmHg (ref 32–45)

## 2024-09-13 LAB — POCT ACTIVATED CLOTTING TIME
Activated Clotting Time: 250 s
Activated Clotting Time: 262 s
Activated Clotting Time: 285 s

## 2024-09-13 SURGERY — RIGHT/LEFT HEART CATH AND CORONARY ANGIOGRAPHY
Anesthesia: Moderate Sedation

## 2024-09-13 MED ORDER — INSULIN ASPART 100 UNIT/ML IJ SOLN
0.0000 [IU] | Freq: Three times a day (TID) | INTRAMUSCULAR | Status: DC
  Administered 2024-09-13: 15 [IU] via SUBCUTANEOUS
  Administered 2024-09-13: 4 [IU] via SUBCUTANEOUS
  Administered 2024-09-14: 7 [IU] via SUBCUTANEOUS
  Filled 2024-09-13 (×3): qty 1

## 2024-09-13 MED ORDER — LIDOCAINE HCL 1 % IJ SOLN
INTRAMUSCULAR | Status: AC
  Filled 2024-09-13: qty 20

## 2024-09-13 MED ORDER — HEPARIN SODIUM (PORCINE) 1000 UNIT/ML IJ SOLN
INTRAMUSCULAR | Status: DC | PRN
  Administered 2024-09-13: 7000 [IU] via INTRAVENOUS
  Administered 2024-09-13: 5000 [IU] via INTRAVENOUS
  Administered 2024-09-13 (×2): 3000 [IU] via INTRAVENOUS

## 2024-09-13 MED ORDER — HYDRALAZINE HCL 20 MG/ML IJ SOLN: 10.0000 mg | INTRAMUSCULAR | Status: AC | PRN

## 2024-09-13 MED ORDER — SODIUM CHLORIDE 0.9% FLUSH: 3.0000 mL | INTRAVENOUS | Status: DC | PRN

## 2024-09-13 MED ORDER — MIDAZOLAM HCL 2 MG/2ML IJ SOLN
INTRAMUSCULAR | Status: DC | PRN
  Administered 2024-09-13 (×2): .5 mg via INTRAVENOUS

## 2024-09-13 MED ORDER — LISINOPRIL 20 MG PO TABS
40.0000 mg | ORAL_TABLET | Freq: Every day | ORAL | Status: DC
  Administered 2024-09-14: 40 mg via ORAL
  Filled 2024-09-13: qty 2

## 2024-09-13 MED ORDER — FUROSEMIDE 10 MG/ML IJ SOLN
40.0000 mg | Freq: Once | INTRAMUSCULAR | Status: AC
  Administered 2024-09-13: 40 mg via INTRAVENOUS

## 2024-09-13 MED ORDER — ASPIRIN 81 MG PO CHEW
81.0000 mg | CHEWABLE_TABLET | ORAL | Status: AC
  Administered 2024-09-13: 81 mg via ORAL

## 2024-09-13 MED ORDER — NITROGLYCERIN 1 MG/10 ML FOR IR/CATH LAB
INTRA_ARTERIAL | Status: DC | PRN
  Administered 2024-09-13 (×2): 200 ug via INTRACORONARY

## 2024-09-13 MED ORDER — ACETAMINOPHEN 325 MG PO TABS
ORAL_TABLET | ORAL | Status: AC
  Administered 2024-09-13: 650 mg via ORAL
  Filled 2024-09-13: qty 2

## 2024-09-13 MED ORDER — HEPARIN (PORCINE) IN NACL 1000-0.9 UT/500ML-% IV SOLN
INTRAVENOUS | Status: AC
  Filled 2024-09-13: qty 1000

## 2024-09-13 MED ORDER — FENTANYL CITRATE (PF) 100 MCG/2ML IJ SOLN
INTRAMUSCULAR | Status: AC
  Filled 2024-09-13: qty 2

## 2024-09-13 MED ORDER — LABETALOL HCL 5 MG/ML IV SOLN: 10.0000 mg | INTRAVENOUS | Status: AC | PRN

## 2024-09-13 MED ORDER — SODIUM CHLORIDE 0.9 % IV SOLN: 250.0000 mL | INTRAVENOUS | Status: AC | PRN

## 2024-09-13 MED ORDER — IOHEXOL 300 MG/ML  SOLN
INTRAMUSCULAR | Status: DC | PRN
  Administered 2024-09-13: 94 mL

## 2024-09-13 MED ORDER — SERTRALINE HCL 50 MG PO TABS
50.0000 mg | ORAL_TABLET | Freq: Every day | ORAL | Status: DC
  Administered 2024-09-14: 50 mg via ORAL
  Filled 2024-09-13: qty 1

## 2024-09-13 MED ORDER — INSULIN ASPART 100 UNIT/ML IJ SOLN
INTRAMUSCULAR | Status: AC
  Filled 2024-09-13: qty 1

## 2024-09-13 MED ORDER — PNEUMOCOCCAL 20-VAL CONJ VACC 0.5 ML IM SUSY
0.5000 mL | PREFILLED_SYRINGE | INTRAMUSCULAR | Status: AC
  Administered 2024-09-14: 0.5 mL via INTRAMUSCULAR
  Filled 2024-09-13: qty 0.5

## 2024-09-13 MED ORDER — CLOPIDOGREL BISULFATE 75 MG PO TABS
ORAL_TABLET | ORAL | Status: DC | PRN
  Administered 2024-09-13: 600 mg via ORAL

## 2024-09-13 MED ORDER — HEPARIN SODIUM (PORCINE) 1000 UNIT/ML IJ SOLN
INTRAMUSCULAR | Status: AC
  Filled 2024-09-13: qty 10

## 2024-09-13 MED ORDER — ACETAMINOPHEN 325 MG PO TABS: 650.0000 mg | ORAL_TABLET | ORAL | Status: DC | PRN

## 2024-09-13 MED ORDER — INSULIN GLARGINE-YFGN 100 UNIT/ML ~~LOC~~ SOLN: 60.0000 [IU] | Freq: Every day | SUBCUTANEOUS | Status: DC

## 2024-09-13 MED ORDER — INFLUENZA VAC SPLIT HIGH-DOSE 0.5 ML IM SUSY
0.5000 mL | PREFILLED_SYRINGE | INTRAMUSCULAR | Status: AC
Start: 2024-09-14 — End: 2024-09-14
  Administered 2024-09-14: 0.5 mL via INTRAMUSCULAR
  Filled 2024-09-13: qty 0.5

## 2024-09-13 MED ORDER — ONDANSETRON HCL 4 MG/2ML IJ SOLN: 4.0000 mg | Freq: Four times a day (QID) | INTRAMUSCULAR | Status: DC | PRN

## 2024-09-13 MED ORDER — VERAPAMIL HCL 2.5 MG/ML IV SOLN
INTRAVENOUS | Status: AC
Start: 2024-09-13 — End: 2024-09-13
  Filled 2024-09-13: qty 2

## 2024-09-13 MED ORDER — FENTANYL CITRATE (PF) 100 MCG/2ML IJ SOLN
INTRAMUSCULAR | Status: DC | PRN
  Administered 2024-09-13 (×2): 12.5 ug via INTRAVENOUS

## 2024-09-13 MED ORDER — MIDAZOLAM HCL 2 MG/2ML IJ SOLN
INTRAMUSCULAR | Status: AC
  Filled 2024-09-13: qty 2

## 2024-09-13 MED ORDER — INSULIN GLARGINE 100 UNIT/ML ~~LOC~~ SOLN
60.0000 [IU] | Freq: Every day | SUBCUTANEOUS | Status: DC
  Administered 2024-09-13: 60 [IU] via SUBCUTANEOUS
  Filled 2024-09-13 (×2): qty 0.6

## 2024-09-13 MED ORDER — SODIUM CHLORIDE 0.9% FLUSH
3.0000 mL | Freq: Two times a day (BID) | INTRAVENOUS | Status: DC
  Administered 2024-09-13 – 2024-09-14 (×2): 3 mL via INTRAVENOUS

## 2024-09-13 MED ORDER — HEPARIN (PORCINE) IN NACL 1000-0.9 UT/500ML-% IV SOLN
INTRAVENOUS | Status: DC | PRN
  Administered 2024-09-13: 1000 mL

## 2024-09-13 MED ORDER — CLOPIDOGREL BISULFATE 75 MG PO TABS
75.0000 mg | ORAL_TABLET | Freq: Every day | ORAL | Status: DC
  Administered 2024-09-14: 75 mg via ORAL
  Filled 2024-09-13: qty 1

## 2024-09-13 MED ORDER — ASPIRIN 81 MG PO CHEW
CHEWABLE_TABLET | ORAL | Status: AC
  Filled 2024-09-13: qty 1

## 2024-09-13 MED ORDER — NITROGLYCERIN 1 MG/10 ML FOR IR/CATH LAB
INTRA_ARTERIAL | Status: AC
  Filled 2024-09-13: qty 10

## 2024-09-13 MED ORDER — ASPIRIN 81 MG PO CHEW
81.0000 mg | CHEWABLE_TABLET | Freq: Every day | ORAL | Status: DC
  Administered 2024-09-14: 81 mg via ORAL
  Filled 2024-09-13: qty 1

## 2024-09-13 MED ORDER — SODIUM CHLORIDE 0.9% FLUSH: 3.0000 mL | Freq: Two times a day (BID) | INTRAVENOUS | Status: DC

## 2024-09-13 MED ORDER — LIDOCAINE HCL (PF) 1 % IJ SOLN
INTRAMUSCULAR | Status: DC | PRN
  Administered 2024-09-13: 2 mL
  Administered 2024-09-13: 5 mL

## 2024-09-13 MED ORDER — VERAPAMIL HCL 2.5 MG/ML IV SOLN
INTRAVENOUS | Status: DC | PRN
  Administered 2024-09-13 (×2): 2.5 mg via INTRA_ARTERIAL

## 2024-09-13 MED ORDER — FREE WATER: 500.0000 mL | Freq: Once | Status: DC

## 2024-09-13 MED ORDER — CARVEDILOL 12.5 MG PO TABS
12.5000 mg | ORAL_TABLET | Freq: Two times a day (BID) | ORAL | Status: DC
  Administered 2024-09-13 – 2024-09-14 (×2): 12.5 mg via ORAL
  Filled 2024-09-13 (×2): qty 1

## 2024-09-13 MED ORDER — CLOPIDOGREL BISULFATE 75 MG PO TABS
ORAL_TABLET | ORAL | Status: AC
  Filled 2024-09-13: qty 8

## 2024-09-13 MED ORDER — SODIUM CHLORIDE 0.9 % IV SOLN
250.0000 mL | INTRAVENOUS | Status: DC | PRN
  Administered 2024-09-13: 250 mL via INTRAVENOUS

## 2024-09-13 MED ORDER — FUROSEMIDE 10 MG/ML IJ SOLN
INTRAMUSCULAR | Status: AC
  Filled 2024-09-13: qty 4

## 2024-09-13 SURGICAL SUPPLY — 17 items
BALLOON MINITREK RX 2.0X12 (BALLOONS) IMPLANT
BALLOON ~~LOC~~ TREK NEO RX 2.25X12 (BALLOONS) IMPLANT
CATH 5F 110X4 TIG (CATHETERS) IMPLANT
CATH BALLN WEDGE 5F 110CM (CATHETERS) IMPLANT
CATH LAUNCHER 5F EBU3.5 (CATHETERS) IMPLANT
CATH VISTA GUIDE 6FR XBLD 3.5 (CATHETERS) IMPLANT
DEVICE RAD TR BAND REGULAR (VASCULAR PRODUCTS) IMPLANT
DRAPE BRACHIAL (DRAPES) IMPLANT
GLIDESHEATH SLEND SS 6F .021 (SHEATH) IMPLANT
GUIDEWIRE INQWIRE 1.5J.035X260 (WIRE) IMPLANT
KIT ENCORE 26 ADVANTAGE (KITS) IMPLANT
PACK CARDIAC CATH (CUSTOM PROCEDURE TRAY) ×2 IMPLANT
SET ATX-X65L (MISCELLANEOUS) IMPLANT
SHEATH GLIDE SLENDER 4/5FR (SHEATH) IMPLANT
STATION PROTECTION PRESSURIZED (MISCELLANEOUS) IMPLANT
STENT ONYX FRONTIER 2.25X18 (Permanent Stent) IMPLANT
WIRE RUNTHROUGH .014X180CM (WIRE) IMPLANT

## 2024-09-13 NOTE — Interval H&P Note (Signed)
 History and Physical Interval Note:  09/13/2024 10:12 AM  Jill Crosby  has presented today for surgery, with the diagnosis of chronic HFpEF and abnormal myocardial PET/CT.  The various methods of treatment have been discussed with the patient and family. After consideration of risks, benefits and other options for treatment, the patient has consented to  Procedure(s): RIGHT/LEFT HEART CATH AND CORONARY ANGIOGRAPHY (Bilateral) as a surgical intervention.  The patient's history has been reviewed, patient examined, no change in status, stable for surgery.  I have reviewed the patient's chart and labs.  Questions were answered to the patient's satisfaction.  Cath Lab Visit (complete for each Cath Lab visit)  Clinical Evaluation Leading to the Procedure:   ACS: No.  Non-ACS:    Anginal/Heart Failure Classification: NYHA class II-III  Anti-ischemic medical therapy: Minimal Therapy (1 class of medications)  Non-Invasive Test Results: Intermediate-risk stress test findings: cardiac mortality 1-3%/year  Prior CABG: No previous CABG  Graci Hulce

## 2024-09-13 NOTE — Brief Op Note (Signed)
 BRIEF CARDIAC CATHETERIZATION NOTE  09/13/2024  11:28 AM  PATIENT:  Jill Crosby  63 y.o. female  PRE-OPERATIVE DIAGNOSIS:  Chronic HFpEF and abnormal myocardial PET/CT  POST-OPERATIVE DIAGNOSIS:  Same  PROCEDURE:  Procedure(s): RIGHT/LEFT HEART CATH AND CORONARY ANGIOGRAPHY (Bilateral) CORONARY STENT INTERVENTION (N/A)  SURGEON:  Surgeons and Role:    * Eugene Zeiders, MD - Primary  FINDINGS: Severe single-vessel CAD with 95% mid LAD stenosis. Moderately elevated left heart and right heart filling pressures. Normal Fick CO/CI. Successful PCI to mid LAD using Onyx Frontier 2.25 x 18 mm DES with 0% residual stenosis and TIMI-3 flow.  RECOMMENDATIONS: Overnight observation. Escalate diuresis. DAPT with aspirin  and clopidogrel for at least 6 months.  [name], MD [facility]

## 2024-09-14 ENCOUNTER — Encounter: Payer: Self-pay | Admitting: Internal Medicine

## 2024-09-14 DIAGNOSIS — R9430 Abnormal result of cardiovascular function study, unspecified: Secondary | ICD-10-CM | POA: Diagnosis not present

## 2024-09-14 DIAGNOSIS — I5032 Chronic diastolic (congestive) heart failure: Secondary | ICD-10-CM | POA: Diagnosis not present

## 2024-09-14 DIAGNOSIS — R600 Localized edema: Secondary | ICD-10-CM

## 2024-09-14 DIAGNOSIS — I25118 Atherosclerotic heart disease of native coronary artery with other forms of angina pectoris: Secondary | ICD-10-CM

## 2024-09-14 DIAGNOSIS — I2510 Atherosclerotic heart disease of native coronary artery without angina pectoris: Secondary | ICD-10-CM | POA: Diagnosis not present

## 2024-09-14 LAB — CBC
HCT: 33.6 % — ABNORMAL LOW (ref 36.0–46.0)
Hemoglobin: 10.9 g/dL — ABNORMAL LOW (ref 12.0–15.0)
MCH: 27.9 pg (ref 26.0–34.0)
MCHC: 32.4 g/dL (ref 30.0–36.0)
MCV: 85.9 fL (ref 80.0–100.0)
Platelets: 256 K/uL (ref 150–400)
RBC: 3.91 MIL/uL (ref 3.87–5.11)
RDW: 14.5 % (ref 11.5–15.5)
WBC: 9.3 K/uL (ref 4.0–10.5)
nRBC: 0 % (ref 0.0–0.2)

## 2024-09-14 LAB — BASIC METABOLIC PANEL WITH GFR
Anion gap: 10 (ref 5–15)
BUN: 19 mg/dL (ref 8–23)
CO2: 25 mmol/L (ref 22–32)
Calcium: 9 mg/dL (ref 8.9–10.3)
Chloride: 97 mmol/L — ABNORMAL LOW (ref 98–111)
Creatinine, Ser: 0.89 mg/dL (ref 0.44–1.00)
GFR, Estimated: >60 mL/min (ref >60)
Glucose, Bld: 113 mg/dL — ABNORMAL HIGH (ref 70–99)
Potassium: 3.6 mmol/L (ref 3.5–5.1)
Sodium: 132 mmol/L — ABNORMAL LOW (ref 135–145)

## 2024-09-14 LAB — GLUCOSE, CAPILLARY
Glucose-Capillary: 103 mg/dL — ABNORMAL HIGH (ref 70–99)
Glucose-Capillary: 212 mg/dL — ABNORMAL HIGH (ref 70–99)

## 2024-09-14 MED ORDER — ASPIRIN 81 MG PO CHEW: 81.0000 mg | CHEWABLE_TABLET | Freq: Every day | ORAL | 3 refills | Status: AC

## 2024-09-14 MED ORDER — TORSEMIDE 20 MG PO TABS: ORAL_TABLET | ORAL | 3 refills | Status: DC

## 2024-09-14 MED ORDER — POTASSIUM CHLORIDE ER 10 MEQ PO TBCR: EXTENDED_RELEASE_TABLET | ORAL | 3 refills | Status: DC

## 2024-09-14 MED ORDER — FUROSEMIDE 10 MG/ML IJ SOLN
40.0000 mg | Freq: Once | INTRAMUSCULAR | Status: AC
  Administered 2024-09-14: 40 mg via INTRAVENOUS
  Filled 2024-09-14: qty 4

## 2024-09-14 MED ORDER — POTASSIUM CHLORIDE CRYS ER 20 MEQ PO TBCR
40.0000 meq | EXTENDED_RELEASE_TABLET | Freq: Once | ORAL | Status: AC
Start: 2024-09-14 — End: 2024-09-14
  Administered 2024-09-14: 40 meq via ORAL
  Filled 2024-09-14: qty 2

## 2024-09-14 MED ORDER — CLOPIDOGREL BISULFATE 75 MG PO TABS: 75.0000 mg | ORAL_TABLET | Freq: Every day | ORAL | 3 refills | Status: DC

## 2024-09-14 NOTE — Discharge Summary (Signed)
 Discharge Summary   Patient ID: Jill Crosby MRN: [id number]; DOB: 1961-08-12  Admit date: 09/13/2024 Discharge date: 09/14/2024  PCP:  Glendia Shad, MD   [facility] Providers Cardiologist:  [name], MD       Discharge Diagnoses  Principal Problem:   Coronary artery disease Active Problems:   Chronic heart failure with preserved ejection fraction (HFpEF) (HCC)   Lower extremity edema   CAD (coronary artery disease)   Abnormal stress test   Diagnostic Studies/Procedures   09/13/2024 R/LHC Severe single-vessel coronary artery disease with sequential 30% proximal and 95% mid LAD stenoses with TIMI-2 flow.  Minimal luminal irregularities noted in the LCx and RCA. Mildly elevated left heart filling pressures (PCWP 23, LVEDP 24 mmHg). Moderately elevated pulmonary artery pressure (PA 50/24, mean 33 mmHg; PVR 1.7 WU). Severely elevated right heart filling pressure mean RA 17, RV EDP 19 mmHg. Normal Fick cardiac output/index (CO 5.8 L/min, CI 2.6 L/min/m). Successful PCI to mid LAD using Onyx frontier 2.25 x 18 mm drug-eluting stent with 0% residual stenosis and TIMI-3 flow.   Recommendations: Dual antiplatelet therapy with aspirin  and clopidogrel for at least 6 months. Escalate diuresis. Consider outpatient sleep study for evaluation of obstructive sleep apnea as well as advanced heart failure consultation. Intervention   _____________   History of Present Illness   Jill Crosby is a 63 y.o. female with history of coronary artery calcification with abnormal myocardial PET/CT, HFpEF, type 1 diabetes, hypertension, hyperlipidemia, and morbid obesity who presented 10/14 for outpatient cardiac catheterization.  Hospital Course    Patient presented 10/14 for outpatient right and left left heart catheterization in the setting of recent abnormal myocardial PET/CT.  Catheterization revealed severe single-vessel CAD with 95% mid LAD stenosis which was  treated with successful PCI/DES of the mid LAD.  She had moderately elevated left and right heart filling pressures.    She was started on DAPT with aspirin  and clopidogrel to be continued for at least 6 months. She was treated with IV Lasix  40 mg x 2 with improvement in volume status. We will escalate her home diuretic therapy to torsemide  20 mg alternating with 40 mg every other day with an addition 10 mEq dose of potassium on days which she takes higher dose of diuretic.      Did the patient have an acute coronary syndrome (MI, NSTEMI, STEMI, etc) this admission?:  No                               Did the patient have a percutaneous coronary intervention (stent / angioplasty)?:  Yes.     Cath/PCI Registry Performance & Quality Measures: Aspirin  prescribed? - Yes ADP Receptor Inhibitor (Plavix/Clopidogrel, Brilinta/Ticagrelor or Effient/Prasugrel) prescribed (includes medically managed patients)? - Yes High Intensity Statin (Lipitor 40-80mg  or Crestor 20-40mg ) prescribed? - Yes For EF <40%, was ACEI/ARB prescribed? - Not Applicable (EF >/= 40%) For EF <40%, Aldosterone Antagonist (Spironolactone or Eplerenone) prescribed? - Not Applicable (EF >/= 40%) Cardiac Rehab Phase II ordered? - Yes      GEN: No acute distress.   Neck: Difficult to assess JVD Cardiac: RRR, no murmurs, rubs, or gallops.  Respiratory: Clear to auscultation bilaterally. GI: Mild abdominal distension; Nontender MS: Trace LE edema; No deformity. Neuro:  Nonfocal  Psych: Normal affect    _____________  Discharge Vitals Blood pressure (!) 146/76, pulse 73, temperature 98.1 F (36.7 C), resp. rate 18, height 5'  4 (1.626 m), weight 121.9 kg, SpO2 94%.  Filed Weights   09/13/24 0904 09/14/24 1300  Weight: 122.5 kg 121.9 kg    Labs & Radiologic Studies  CBC Recent Labs    09/13/24 1034 09/14/24 0343  WBC  --  9.3  HGB 12.2 10.9*  HCT 36.0 33.6*  MCV  --  85.9  PLT  --  256   Basic Metabolic Panel Recent  Labs    09/13/24 1034 09/14/24 0343  NA 136 132*  K 4.0 3.6  CL  --  97*  CO2  --  25  GLUCOSE  --  113*  BUN  --  19  CREATININE  --  0.89  CALCIUM   --  9.0   Liver Function Tests No results for input(s): AST, ALT, ALKPHOS, BILITOT, PROT, ALBUMIN in the last 72 hours. No results for input(s): LIPASE, AMYLASE in the last 72 hours. High Sensitivity Troponin:   No results for input(s): TROPONINIHS in the last 720 hours.  No results for input(s): TRNPT in the last 720 hours.  BNP Invalid input(s): POCBNP No results for input(s): PROBNP in the last 72 hours.  No results for input(s): BNP in the last 72 hours.  D-Dimer No results for input(s): DDIMER in the last 72 hours. Hemoglobin A1C No results for input(s): HGBA1C in the last 72 hours. Fasting Lipid Panel No results for input(s): CHOL, HDL, LDLCALC, TRIG, CHOLHDL, LDLDIRECT in the last 72 hours. No results found for: LIPOA  Thyroid Function Tests No results for input(s): TSH, T4TOTAL, T3FREE, THYROIDAB in the last 72 hours.  Invalid input(s): FREET3 _____________   Disposition Patient reports improvement in lower extremity swelling and abdominal fullness.  She is being discharged home today in good condition.  Cath site is stable without sign of bleeding or hematoma.  Follow-up Plans & Appointments  Discharge Instructions     (HEART FAILURE PATIENTS) Call MD:  Anytime you have any of the following symptoms: 1) 3 pound weight gain in 24 hours or 5 pounds in 1 week 2) shortness of breath, with or without a dry hacking cough 3) swelling in the hands, feet or stomach 4) if you have to sleep on extra pillows at night in order to breathe.   Complete by: As directed    AMB Referral to Cardiac Rehabilitation - Phase II   Complete by: As directed    Diagnosis: Coronary Stents   After initial evaluation and assessments completed: Virtual Based Care may be provided alone or in  conjunction with Phase 2 Cardiac Rehab based on patient barriers.: Yes   Intensive Cardiac Rehabilitation (ICR) [facility] location only OR Traditional Cardiac Rehabilitation (TCR) *If criteria for ICR are not met will enroll in TCR [facility] only): Yes   Call MD for:  difficulty breathing, headache or visual disturbances   Complete by: As directed    Call MD for:  hives   Complete by: As directed    Call MD for:  persistant nausea and vomiting   Complete by: As directed    Call MD for:  redness, tenderness, or signs of infection (pain, swelling, redness, odor or green/yellow discharge around incision site)   Complete by: As directed    Call MD for:  severe uncontrolled pain   Complete by: As directed    Call MD for:  temperature >100.4   Complete by: As directed    Diet - low sodium heart healthy   Complete by: As directed    Increase  activity slowly   Complete by: As directed        Discharge Medications Allergies as of 09/14/2024       Reactions   Codeine Nausea And Vomiting        Medication List     TAKE these medications    aspirin  81 MG chewable tablet Chew 1 tablet (81 mg total) by mouth daily. Start taking on: September 15, 2024   B-12 PO Take 2,000 mcg by mouth daily.   Bayer Microlet Lancets lancets USE AS DIRECTED TO CHECK  SUGARS 1 TO 2 TIMES A DAY   carvedilol  12.5 MG tablet Commonly known as: COREG  Take 1 tablet (12.5 mg total) by mouth 2 (two) times daily.   cholecalciferol 25 MCG (1000 UNIT) tablet Commonly known as: VITAMIN D3 Take 1,000 Units by mouth daily.   clopidogrel 75 MG tablet Commonly known as: PLAVIX Take 1 tablet (75 mg total) by mouth daily with breakfast. Start taking on: September 15, 2024   Contour Next Test test strip Generic drug: glucose blood USE TO CHECK BLOOD GLUCOSE 6 TO  7 TIMES DAILY   insulin  lispro 100 UNIT/ML KwikPen Commonly known as: HumaLOG  KwikPen INJECT SUBCUTANEOUSLY 65 UNITS  DAILY (DISTRIBUTED OVER THREE  MEALS  COUNTING CARBS)   Insulin  Pen Needle 32G X 4 MM Misc Use as directed with insulin  4 x daily.   Lantus  SoloStar 100 UNIT/ML Solostar Pen Generic drug: insulin  glargine INJECT SUBCUTANEOUSLY 80 UNITS  AT NIGHT What changed: See the new instructions.   levocetirizine 5 MG tablet Commonly known as: XYZAL  TAKE 1 TABLET BY MOUTH IN THE  EVENING What changed:  when to take this reasons to take this   lisinopril  40 MG tablet Commonly known as: ZESTRIL  TAKE 1 TABLET BY MOUTH DAILY   potassium chloride  10 MEQ tablet Commonly known as: KLOR-CON  One tablet daily alternating with two tablets every other day, coinciding with torsemide  dosing What changed:  how much to take how to take this when to take this additional instructions   Repatha  SureClick 140 MG/ML Soaj Generic drug: Evolocumab  INJECT 1 PEN SUBCUTANEOUSLY  EVERY 2 WEEKS   sertraline  50 MG tablet Commonly known as: ZOLOFT  TAKE 1 TABLET BY MOUTH DAILY   torsemide  20 MG tablet Commonly known as: DEMADEX  One tablet daily alternating with 2 tablets every other day What changed:  how much to take how to take this when to take this additional instructions         Duration of Discharge Encounter: APP Time: 25 minutes   Signed, Lesley LITTIE Maffucci, PA-C 09/14/2024, 1:13 PM

## 2024-09-14 NOTE — Plan of Care (Signed)
  Problem: Activity: Goal: Ability to return to baseline activity level will improve Outcome: Progressing   Problem: Cardiovascular: Goal: Ability to achieve and maintain adequate cardiovascular perfusion will improve Outcome: Progressing Goal: Vascular access site(s) Level 0-1 will be maintained Outcome: Progressing   

## 2024-09-14 NOTE — TOC CM/SW Note (Signed)
 Transition of Care [facility]) - Inpatient Brief Assessment   Patient Details  Name: Jill Crosby MRN: [id number] Date of Birth: 1961/05/28  Transition of Care [facility]) CM/SW Contact:    Lauraine JAYSON Carpen, LCSW Phone Number: 09/14/2024, 12:57 PM   Clinical Narrative: Patient has orders to discharge home today. Chart reviewed. No TOC needs identified. CSW signing off.  Transition of Care Asessment: Insurance and Status: Insurance coverage has been reviewed Patient has primary care physician: Yes Home environment has been reviewed: Single family home Prior level of function:: Not documented Prior/Current Home Services: No current home services Social Drivers of Health Review: SDOH reviewed no interventions necessary Readmission risk has been reviewed: Yes Transition of care needs: no transition of care needs at this time

## 2024-09-15 ENCOUNTER — Telehealth: Payer: Self-pay | Admitting: Internal Medicine

## 2024-09-15 LAB — LIPOPROTEIN A (LPA): Lipoprotein (a): <8.4 nmol/L (ref <75.0)

## 2024-09-15 NOTE — Telephone Encounter (Signed)
 Patient called requesting a return to work note. Patient was informed that a follow up appointment is typically required prior to release. However, the nurse will forward the message to the PA for clarification.

## 2024-09-15 NOTE — Telephone Encounter (Signed)
 Pt needs work note to return to work. She would like a call back to know when this can be done.

## 2024-09-15 NOTE — Telephone Encounter (Signed)
 Pt called see [facility] note and letter created

## 2024-09-20 ENCOUNTER — Other Ambulatory Visit: Payer: Self-pay | Admitting: Internal Medicine

## 2024-09-21 NOTE — Telephone Encounter (Signed)
 Talked to Pt and she will bring in the $29.00 in cash and fill out papers when she comes in on her next visit Oct 28.

## 2024-09-21 NOTE — Telephone Encounter (Signed)
 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 

## 2024-09-26 ENCOUNTER — Encounter: Payer: Self-pay | Admitting: Internal Medicine

## 2024-09-26 DIAGNOSIS — E1065 Type 1 diabetes mellitus with hyperglycemia: Secondary | ICD-10-CM

## 2024-09-26 NOTE — Progress Notes (Unsigned)
 Cardiology Office Note    Date:  09/27/2024   ID:  Jill Crosby, DOB December 24, 1960, MRN [id number]  PCP:  Glendia Shad, MD  Cardiologist:  [name], MD  Electrophysiologist:  None   Chief Complaint: Follow-up  History of Present Illness:   Jill Crosby is a 63 y.o. female with history of CAD status post PCI/DES to the mid LAD on 09/13/2024, HFpEF/pulmonary hypertension, type 1 diabetes, HTN, HLD, and morbid obesity who presents for follow-up of R/LHC.  She was evaluated as a new patient by Dr. Hanson on 05/18/2024 for evaluation of progressive weight gain and swelling.  She had previously undergone an echo in 01/2024 during a hospital admission for altered mental status in the setting of severe hyponatremia and influenza that showed an EF of 55 to 60%, mildly dilated LV internal cavity size, mild LVH, grade 2 diastolic dysfunction, normal RV systolic function and ventricular cavity size, mildly dilated left atrium, mild mitral regurgitation, and a tricuspid aortic valve.  At the time of her visit in 05/2024, she reported a 17 pound weight gain over the preceding 6 weeks and was using compression stockings with modest improvement.  She had also been started on furosemide  20 mg daily as needed earlier that month that she was taking on a regular basis.  She reported having previously been on hydrochlorothiazide  leading up to her admission in 01/2024, though this had not been restarted given concern that it may have contributed to her significant hyponatremia (of note she also had a severe GI illness shortly before her admission and was only drinking water ).  It was felt her lower extremity edema was likely multifactorial with a degree of HFpEF possibly contributing given diastolic dysfunction noted on echo in 01/2024.  Furosemide  was titrated to 40 mg daily with recommendation to restrict excess fluid and sodium.  She followed up in the office on 06/22/2024 with her weight being up another 13  pounds today when compared to her visit in 05/2024.  She continued to note lower extremity swelling that was typically improved early in the morning and 1 progressive throughout the day.  She reported good urine output with furosemide  for 2 to 3 hours.  She also noted her lower extremity swelling became more pronounced after resuming work (works from home with her feet hanging down from the chair most of the day).  She did report wearing compression socks.  Labs obtained at that time showed stable renal function and electrolytes with recommendation for her to titrate furosemide  to 40 mg twice daily.  At follow-up in 05/2024, amlodipine  was discontinued and furosemide  was transitioned to torsemide , which was subsequently transitioned to 20 mg daily with mild renal dysfunction noted on follow-up lab.   She was seen in the office on 07/20/2024 with improvement in lower extremity swelling following the above changes.  She was without chest pain or shortness of breath, though did report a couple episodes of paroxysmal nocturnal dyspnea.  Subsequent Myocardial PET/CT on 08/25/2024 was intermediate risk with a medium defect with moderate reduction in uptake present in the apical anterior and apex location that was reversible with abnormal wall motion in the defect area consistent with ischemia.  LVEF normal.  Coronary artery calcification present in the LAD and LCx distributions.  In this setting, she underwent R/LHC on 09/13/2024 that showed severe single-vessel CAD with sequential 30% proximal and 95% mid LAD stenoses with minimal luminal irregularities noted in the LCx and RCA.  Mildly elevated left heart  filling pressure, moderately elevated pulmonary artery pressure, severely elevated right heart filling pressure, and normal cardiac output/index.  She underwent successful PCI/DES to the mid LAD.  She underwent IV diuresis while admitted.  At time of discharge torsemide  was increased to 40 mg alternating with 20 mg every  other day.  She comes in accompanied by her sister today and is without symptoms of angina or cardiac decompensation.  Overall, dyspnea, lower extremity swelling, and abdominal distention are improved following PCI and escalation of diuresis.  Her weight is down 8 pounds today when compared to her visit on 09/05/2024.  She does note ongoing daytime fatigue and reports poor sleep hygiene.  No falls, hematochezia, or melena.  No dizziness, presyncope, or syncope.  Largely sedentary due to significant osteoarthritis involving the knees  Adherent to DAPT.  No right radial arteriotomy site complications.   Labs independently reviewed: 08/2024 - LP(a) less than 8.4, Hgb 10.9, PLT 256, potassium 3.6, BUN 19, serum creatinine 0.89 03/2024 - TSH normal, TC 129, TG 255, HDL 60, LDL 31, albumin 4.6, AST/ALT normal, A1c 7.4  Past Medical History:  Diagnosis Date   Anxiety    Depression    Elevated BP    Hypercholesterolemia    Increased BMI    Menopause    Morbid obesity (HCC)    Type I diabetes mellitus (HCC)     Past Surgical History:  Procedure Laterality Date   BREAST BIOPSY Left 05/09/2020   ribbon clip, stereo bx, HEMANGIOMA, BENIGN. - MAMMARY EPITHELIUM IS NOT PRESENT.   CATARACT EXTRACTION W/PHACO Left 11/08/2020   Procedure: CATARACT EXTRACTION PHACO AND INTRAOCULAR LENS PLACEMENT (IOC) LEFT DIABETIC 4.14 00:52.1 ;  Surgeon: Jaye Fallow, MD;  Location: [facility];  Service: Ophthalmology;  Laterality: Left;   CATARACT EXTRACTION W/PHACO Right 12/04/2020   Procedure: CATARACT EXTRACTION PHACO AND INTRAOCULAR LENS PLACEMENT (IOC) RIGHT DIABETIC 6.11 00:46.1;  Surgeon: Jaye Fallow, MD;  Location: [facility];  Service: Ophthalmology;  Laterality: Right;  Diabetic - insulin    CESAREAN SECTION  1997   COLONOSCOPY WITH PROPOFOL  N/A 01/18/2021   Procedure: COLONOSCOPY WITH PROPOFOL ;  Surgeon: Jinny Carmine, MD;  Location: [facility];  Service: Endoscopy;   Laterality: N/A;  priority 4   CORONARY STENT INTERVENTION N/A 09/13/2024   Procedure: CORONARY STENT INTERVENTION;  Surgeon: Mady Bruckner, MD;  Location: [facility] IN[facility]AS[facility];  Service: Cardiovascular;  Laterality: N/A;   RIGHT/LEFT HEART CATH AND CORONARY ANGIOGRAPHY Bilateral 09/13/2024   Procedure: RIGHT/LEFT HEART CATH AND CORONARY ANGIOGRAPHY;  Surgeon: Mady Bruckner, MD;  Location: [facility] IN[facility]AS[facility] CV LAB;  Service: Cardiovascular;  Laterality: Bilateral;    Current Medications: Current Meds  Medication Sig   aspirin  81 MG chewable tablet Chew 1 tablet (81 mg total) by mouth daily.   BAYER MICROLET LANCETS lancets USE AS DIRECTED TO CHECK  SUGARS 1 TO 2 TIMES A DAY   carvedilol  (COREG ) 12.5 MG tablet Take 1 tablet (12.5 mg total) by mouth 2 (two) times daily.   cholecalciferol (VITAMIN D3) 25 MCG (1000 UT) tablet Take 1,000 Units by mouth daily.   CONTOUR NEXT TEST test strip USE TO CHECK BLOOD GLUCOSE 6 TO  7 TIMES DAILY   Cyanocobalamin  (B-12 PO) Take 2,000 mcg by mouth daily.   insulin  lispro (HUMALOG  KWIKPEN) 100 UNIT/ML KwikPen INJECT SUBCUTANEOUSLY 65 UNITS  DAILY (DISTRIBUTED OVER THREE  MEALS COUNTING CARBS)   Insulin  Pen Needle (BD PEN NEEDLE NANO ULTRAFINE) 32G X 4 MM MISC USE AS DIRECTED WITH  INSULIN  4  TIMES DAILY   LANTUS  SOLOSTAR 100 UNIT/ML Solostar Pen INJECT SUBCUTANEOUSLY 80 UNITS  AT NIGHT (Patient taking differently: Inject 20-60 Units into the skin See admin instructions. Take 60 units at 10 pm and 20 units a 6 am)   levocetirizine (XYZAL ) 5 MG tablet TAKE 1 TABLET BY MOUTH IN THE  EVENING (Patient taking differently: Take 5 mg by mouth daily as needed for allergies.)   lisinopril  (ZESTRIL ) 40 MG tablet TAKE 1 TABLET BY MOUTH DAILY   potassium chloride  (KLOR-CON ) 10 MEQ tablet One tablet daily alternating with two tablets every other day, coinciding with torsemide  dosing   REPATHA  SURECLICK 140 MG/ML SOAJ INJECT 1 PEN SUBCUTANEOUSLY  EVERY 2 WEEKS   sertraline   (ZOLOFT ) 50 MG tablet TAKE 1 TABLET BY MOUTH DAILY   torsemide  (DEMADEX ) 20 MG tablet One tablet daily alternating with 2 tablets every other day   [DISCONTINUED] clopidogrel (PLAVIX) 75 MG tablet Take 1 tablet (75 mg total) by mouth daily with breakfast.    Allergies:   Codeine   Social History   Socioeconomic History   Marital status: Single    Spouse name: Not on file   Number of children: Not on file   Years of education: Not on file   Highest education level: 12th grade  Occupational History   Not on file  Tobacco Use   Smoking status: Former    Current packs/day: 0.50    Average packs/day: 0.5 packs/day for 20.0 years (10.0 ttl pk-yrs)    Types: Cigarettes   Smokeless tobacco: Never  Substance and Sexual Activity   Alcohol use: No    Alcohol/week: 0.0 standard drinks of alcohol   Drug use: No   Sexual activity: Not Currently    Birth control/protection: Post-menopausal  Other Topics Concern   Not on file  Social History Narrative   Not on file   Social Drivers of Health   Financial Resource Strain: Low Risk  (06/08/2024)   Received from [facility]   Overall Financial Resource Strain (CARDIA)    Difficulty of Paying Living Expenses: Not hard at all  Food Insecurity: No Food Insecurity (09/13/2024)   Hunger Vital Sign    Worried About Running Out of Food in the Last Year: Never true    Ran Out of Food in the Last Year: Never true  Transportation Needs: No Transportation Needs (09/13/2024)   PRAPARE - Administrator, Civil Service (Medical): No    Lack of Transportation (Non-Medical): No  Physical Activity: Inactive (05/12/2024)   Exercise Vital Sign    Days of Exercise per Week: 0 days    Minutes of Exercise per Session: Not on file  Stress: Stress Concern Present (05/12/2024)   [employer] of Occupational Health - Occupational Stress Questionnaire    Feeling of Stress: To some extent  Social Connections: Moderately Integrated  (09/13/2024)   Social Connection and Isolation Panel    Frequency of Communication with Friends and Family: More than three times a week    Frequency of Social Gatherings with Friends and Family: Three times a week    Attends Religious Services: More than 4 times per year    Active Member of Clubs or Organizations: Yes    Attends Banker Meetings: 1 to 4 times per year    Marital Status: Divorced     Family History:  The patient's family history includes Breast cancer in her maternal aunt and maternal grandmother; Cancer in  her father; Colon cancer in her maternal grandfather and paternal grandfather; Colon polyps in her mother; Diabetes in her maternal grandfather; Hyperlipidemia in her mother; Hypertension in her mother. There is no history of Heart disease or Ovarian cancer.  ROS:   12-point review of systems is negative unless otherwise noted in the HPI.   EKGs/Labs/Other Studies Reviewed:    Studies reviewed were summarized above. The additional studies were reviewed today:  [facility] 09/13/2024: Conclusions: Severe single-vessel coronary artery disease with sequential 30% proximal and 95% mid LAD stenoses with TIMI-2 flow.  Minimal luminal irregularities noted in the LCx and RCA. Mildly elevated left heart filling pressures (PCWP 23, LVEDP 24 mmHg). Moderately elevated pulmonary artery pressure (PA 50/24, mean 33 mmHg; PVR 1.7 WU). Severely elevated right heart filling pressure mean RA 17, RV EDP 19 mmHg. Normal Fick cardiac output/index (CO 5.8 L/min, CI 2.6 L/min/m). Successful PCI to mid LAD using Onyx frontier 2.25 x 18 mm drug-eluting stent with 0% residual stenosis and TIMI-3 flow.   Recommendations: Dual antiplatelet therapy with aspirin  and clopidogrel for at least 6 months. Escalate diuresis. Consider outpatient sleep study for evaluation of obstructive sleep apnea as well as advanced heart failure consultation. __________  Myocardial PET/CT 08/25/2024:    Findings are consistent with apical ischemia. The study is intermediate risk due to medium size area of ischemia, and mildly reduced MBFR.   LV perfusion is abnormal. There is evidence of ischemia. There is no evidence of infarction. Defect 1: There is a medium defect with moderate reduction in uptake present in the apical anterior and apex location(s) that is reversible. There is abnormal wall motion in the defect area. Consistent with ischemia.   Rest left ventricular function is normal. Rest EF: 57%. Stress left ventricular function is normal. Stress EF: 62%. End diastolic cavity size is normal. End systolic cavity size is normal. No evidence of transient ischemic dilation (TID) noted.   Myocardial blood flow was computed to be 0.71ml/g/min at rest and 1.21ml/g/min at stress. Global myocardial blood flow reserve was 1.70 and was mildly abnormal.   Coronary calcium  was present on the attenuation correction CT images. Mild coronary calcifications were present. Coronary calcifications were present in the left anterior descending artery and left circumflex artery distribution(s). __________   2D echo 01/05/2024: 1. Left ventricular ejection fraction, by estimation, is 55 to 60%. The  left ventricle has normal function. Left ventricular endocardial border  not optimally defined to evaluate regional wall motion. The left  ventricular internal cavity size was mildly  dilated. There is mild left ventricular hypertrophy. Left ventricular  diastolic parameters are consistent with Grade II diastolic dysfunction  (pseudonormalization).   2. Right ventricular systolic function is normal. The right ventricular  size is normal.   3. Left atrial size was mildly dilated.   4. The mitral valve is grossly normal. Mild mitral valve regurgitation.  No evidence of mitral stenosis.   5. The aortic valve is tricuspid. Aortic valve regurgitation is not  visualized. No aortic stenosis is present.    EKG:  EKG is  ordered today.  The EKG ordered today demonstrates sinus rhythm with significant baseline artifact, 97 bpm  Recent Labs: 01/03/2024: B Natriuretic Peptide 239.2 01/10/2024: Magnesium  2.3 04/27/2024: ALT 31; TSH 2.010 09/14/2024: BUN 19; Creatinine, Ser 0.89; Hemoglobin 10.9; Platelets 256; Potassium 3.6; Sodium 132  Recent Lipid Panel    Component Value Date/Time   CHOL 129 04/27/2024 1226   TRIG 255 (H) 04/27/2024 1226   HDL  60 04/27/2024 1226   CHOLHDL 2.2 04/27/2024 1226   CHOLHDL 2.1 01/03/2024 2036   VLDL 17 01/03/2024 2036   LDLCALC 31 04/27/2024 1226   LDLDIRECT 32 01/03/2024 2036    PHYSICAL EXAM:    VS:  BP 138/77 (BP Location: Left Wrist, Patient Position: Sitting, Cuff Size: Normal)   Pulse 97 Comment: 95 oximeter  Ht 5' 4 (1.626 m)   Wt 263 lb (119.3 kg)   SpO2 96%   BMI 45.14 kg/m   BMI: Body mass index is 45.14 kg/m.  Physical Exam Vitals reviewed.  Constitutional:      Appearance: She is well-developed.  HENT:     Head: Normocephalic and atraumatic.  Eyes:     General:        Right eye: No discharge.        Left eye: No discharge.  Cardiovascular:     Rate and Rhythm: Normal rate and regular rhythm.     Heart sounds: Normal heart sounds, S1 normal and S2 normal. Heart sounds not distant. No midsystolic click and no opening snap. No murmur heard.    No friction rub.     Comments: Right radial arteriotomy site without active bleeding, swelling, warmth, erythema, bruising, or TTP.  Radial pulse 2+ proximal and distal to the arteriotomy site.  Pulmonary:     Effort: Pulmonary effort is normal. No respiratory distress.     Breath sounds: Normal breath sounds. No decreased breath sounds, wheezing, rhonchi or rales.  Musculoskeletal:     Cervical back: Normal range of motion.     Right lower leg: No edema.     Left lower leg: No edema.  Skin:    General: Skin is warm and dry.     Nails: There is no clubbing.  Neurological:     Mental Status: She is alert  and oriented to person, place, and time.  Psychiatric:        Speech: Speech normal.        Behavior: Behavior normal.        Thought Content: Thought content normal.        Judgment: Judgment normal.     Wt Readings from Last 3 Encounters:  09/27/24 263 lb (119.3 kg)  09/14/24 268 lb 12.8 oz (121.9 kg)  09/05/24 271 lb 9.6 oz (123.2 kg)     ASSESSMENT & PLAN:   CAD involving the native coronary arteries without angina: She is doing well and without symptoms concerning for angina.  Continue DAPT with aspirin  81 mg and clopidogrel 75 mg daily without interruption for a minimum of 6 months from date of PCI (09/13/2024).  She will otherwise remain on Repatha  as outlined below.  Post-cath instructions.  Has been referred to cardiac rehab.  No indication for further ischemic testing.  May return to work on 10/03/2024.  Check CBC.  HFpEF/pulmonary hypertension and lower extremity edema: Volume status is improving.  RHC earlier this month showed mildly elevated left heart filling pressure, moderately elevated artery pressure, and severely elevated right heart filling pressure.  At time of discharge torsemide  was titrated to 40 mg alternating with 20 mg every other day.  Check BMP to ensure stable electrolytes and renal function.  In follow-up, consider escalation of GDMT including SGLT2 inhibitor and MRA.  If there is difficulty in diuresing or right side pressures complicated by AKI, may need to consider advanced heart failure referral.  She has been referred to pulmonology for sleep study as outlined below as  well.  HTN: Blood pressure is reasonably controlled in the office today on carvedilol  12.5 mg twice daily and lisinopril  40 mg.  Ongoing diuresis as above.  HLD: LDL 31 in 03/2024 with normal AST/ALT at that time.  She remains on Repatha  140 mg injected every 2 weeks.  Obesity/sleep disordered breathing: Weight loss is encouraged through heart healthy diet and regular exercise (limited by knee  pain).  Referred to pulmonology for sleep study.     Disposition: F/u with Dr. Mady or an APP in 1 month.   Medication Adjustments/Labs and Tests Ordered: Current medicines are reviewed at length with the patient today.  Concerns regarding medicines are outlined above. Medication changes, Labs and Tests ordered today are summarized above and listed in the Patient Instructions accessible in Encounters.   Signed, Bernardino Bring, PA-C 09/27/2024 1:00 PM     [facility] [address] [place], [place] [address] [phone]

## 2024-09-27 ENCOUNTER — Encounter: Payer: Self-pay | Admitting: Internal Medicine

## 2024-09-27 ENCOUNTER — Ambulatory Visit: Attending: Physician Assistant | Admitting: Physician Assistant

## 2024-09-27 ENCOUNTER — Encounter: Payer: Self-pay | Admitting: Physician Assistant

## 2024-09-27 VITALS — BP 138/77 | HR 97 | Ht 64.0 in | Wt 263.0 lb

## 2024-09-27 DIAGNOSIS — I5032 Chronic diastolic (congestive) heart failure: Secondary | ICD-10-CM

## 2024-09-27 DIAGNOSIS — I10 Essential (primary) hypertension: Secondary | ICD-10-CM | POA: Diagnosis not present

## 2024-09-27 DIAGNOSIS — G4730 Sleep apnea, unspecified: Secondary | ICD-10-CM

## 2024-09-27 DIAGNOSIS — Z6841 Body Mass Index (BMI) 40.0 and over, adult: Secondary | ICD-10-CM

## 2024-09-27 DIAGNOSIS — I2510 Atherosclerotic heart disease of native coronary artery without angina pectoris: Secondary | ICD-10-CM

## 2024-09-27 DIAGNOSIS — E66813 Obesity, class 3: Secondary | ICD-10-CM

## 2024-09-27 DIAGNOSIS — R600 Localized edema: Secondary | ICD-10-CM | POA: Diagnosis not present

## 2024-09-27 DIAGNOSIS — Z79899 Other long term (current) drug therapy: Secondary | ICD-10-CM

## 2024-09-27 DIAGNOSIS — E785 Hyperlipidemia, unspecified: Secondary | ICD-10-CM

## 2024-09-27 DIAGNOSIS — I25118 Atherosclerotic heart disease of native coronary artery with other forms of angina pectoris: Secondary | ICD-10-CM

## 2024-09-27 DIAGNOSIS — I272 Pulmonary hypertension, unspecified: Secondary | ICD-10-CM | POA: Insufficient documentation

## 2024-09-27 DIAGNOSIS — Z0279 Encounter for issue of other medical certificate: Secondary | ICD-10-CM

## 2024-09-27 MED ORDER — CLOPIDOGREL BISULFATE 75 MG PO TABS: 75.0000 mg | ORAL_TABLET | Freq: Every day | ORAL | 3 refills | Status: AC

## 2024-09-27 MED ORDER — INSULIN LISPRO (1 UNIT DIAL) 100 UNIT/ML (KWIKPEN): PEN_INJECTOR | SUBCUTANEOUS | 0 refills | Status: DC

## 2024-09-27 NOTE — Telephone Encounter (Signed)
 Patient filled out [facility] forms and paid $29.00 cash fee Forms placed in nurse box to be completed

## 2024-09-27 NOTE — Telephone Encounter (Signed)
 Spoke to pt. Pt stated that she had been doing 80 units total. Informed pt we only have her doing 65 units. Scheduled pt an appt due to A1c needing to be done and pt hasn't been seen recently. [facility]

## 2024-09-27 NOTE — Patient Instructions (Signed)
 Medication Instructions:  Your physician recommends that you continue on your current medications as directed. Please refer to the Current Medication list given to you today.   *If you need a refill on your cardiac medications before your next appointment, please call your pharmacy*  Lab Work:CBC  Your provider would like for you to have following labs drawn today CBC and BMeT.   If you have labs (blood work) drawn today and your tests are completely normal, you will receive your results only by: MyChart Message (if you have MyChart) OR A paper copy in the mail If you have any lab test that is abnormal or we need to change your treatment, we will call you to review the results.  Follow-Up: At [facility], you and your health needs are our priority.  As part of our continuing mission to provide you with exceptional heart care, our providers are all part of one team.  This team includes your primary Cardiologist (physician) and Advanced Practice Providers or APPs (Physician Assistants and Nurse Practitioners) who all work together to provide you with the care you need, when you need it.  Your next appointment:   1 month(s)  Provider:   You may see [name], MD or Bernardino Bring, PA-C

## 2024-09-27 NOTE — Telephone Encounter (Signed)
 FMLA/Short-Term Disability paperwork received from [employer] [place], given to [place], paperwork returned to me from [employer] and upon his direction I've added to it the printed out office visit notes from 10/28 and the catheterization notes from 10/14  Paperwork returned to front office

## 2024-09-28 ENCOUNTER — Other Ambulatory Visit: Payer: Self-pay | Admitting: Emergency Medicine

## 2024-09-28 ENCOUNTER — Ambulatory Visit: Payer: Self-pay | Admitting: Physician Assistant

## 2024-09-28 DIAGNOSIS — Z79899 Other long term (current) drug therapy: Secondary | ICD-10-CM

## 2024-09-28 LAB — CBC
Hematocrit: 40.5 % (ref 34.0–46.6)
Hemoglobin: 12.8 g/dL (ref 11.1–15.9)
MCH: 28.2 pg (ref 26.6–33.0)
MCHC: 31.6 g/dL (ref 31.5–35.7)
MCV: 89 fL (ref 79–97)
Platelets: 289 x10E3/uL (ref 150–450)
RBC: 4.54 x10E6/uL (ref 3.77–5.28)
RDW: 14.3 % (ref 11.7–15.4)
WBC: 9.6 x10E3/uL (ref 3.4–10.8)

## 2024-09-28 LAB — BASIC METABOLIC PANEL WITH GFR
BUN/Creatinine Ratio: 25 (ref 12–28)
BUN: 30 mg/dL — ABNORMAL HIGH (ref 8–27)
CO2: 25 mmol/L (ref 20–29)
Calcium: 10.5 mg/dL — ABNORMAL HIGH (ref 8.7–10.3)
Chloride: 92 mmol/L — ABNORMAL LOW (ref 96–106)
Creatinine, Ser: 1.22 mg/dL — ABNORMAL HIGH (ref 0.57–1.00)
Glucose: 180 mg/dL — ABNORMAL HIGH (ref 70–99)
Potassium: 4.7 mmol/L (ref 3.5–5.2)
Sodium: 132 mmol/L — ABNORMAL LOW (ref 134–144)
eGFR: 49 mL/min/1.73 — ABNORMAL LOW (ref >59)

## 2024-09-28 MED ORDER — POTASSIUM CHLORIDE ER 10 MEQ PO TBCR: EXTENDED_RELEASE_TABLET | ORAL | 3 refills | Status: AC

## 2024-09-28 MED ORDER — TORSEMIDE 20 MG PO TABS: ORAL_TABLET | ORAL | 3 refills | Status: DC

## 2024-09-28 NOTE — Telephone Encounter (Signed)
 Faxed forms to [employer] patient and informed that her copy with be in pick up bin at the front desk

## 2024-10-04 ENCOUNTER — Encounter: Payer: Self-pay | Admitting: Sleep Medicine

## 2024-10-04 ENCOUNTER — Ambulatory Visit: Admitting: Sleep Medicine

## 2024-10-04 VITALS — BP 100/66 | HR 74 | Temp 98.3°F | Ht 64.25 in | Wt 271.0 lb

## 2024-10-04 DIAGNOSIS — G4733 Obstructive sleep apnea (adult) (pediatric): Secondary | ICD-10-CM

## 2024-10-04 DIAGNOSIS — I509 Heart failure, unspecified: Secondary | ICD-10-CM | POA: Diagnosis not present

## 2024-10-04 DIAGNOSIS — Z87891 Personal history of nicotine dependence: Secondary | ICD-10-CM | POA: Diagnosis not present

## 2024-10-04 DIAGNOSIS — I5032 Chronic diastolic (congestive) heart failure: Secondary | ICD-10-CM

## 2024-10-04 DIAGNOSIS — R0683 Snoring: Secondary | ICD-10-CM | POA: Diagnosis not present

## 2024-10-04 NOTE — Patient Instructions (Signed)
 Jill Crosby

## 2024-10-04 NOTE — Progress Notes (Signed)
 Name:Jill Crosby MRN: [id number] DOB: March 04, 1961   CHIEF COMPLAINT:  EXCESSIVE DAYTIME SLEEPINESS   HISTORY OF PRESENT ILLNESS: Ms. Hokenson is a 63 y.o. w/ a h/o HTN, CAD, CHF and morbid obesity who presents for c/o loud snoring which has been present for several years. Reports nocturnal awakenings due to unclear reasons, however does not have difficulty falling back to sleep. Denies any significant weight changes. Admits to dry mouth. Denies morning headaches, RLS symptoms, dream enactment, cataplexy, hypnagogic or hypnapompic hallucinations. Denies a family history of sleep apnea. Denies drowsy driving. Drinks 1-2 coffee daily and soda occasionally, denies alcohol, tobacco or illicit drug use.   Bedtime 10 pm Sleep onset 30 mins Rise time 5:30 am   EPWORTH SLEEP SCORE 7    10/04/2024    1:00 PM  Results of the Epworth flowsheet  Sitting and reading 2  Watching TV 3  Sitting, inactive in a public place (e.g. a theatre or a meeting) 0  As a passenger in a car for an hour without a break 0  Lying down to rest in the afternoon when circumstances permit 2  Sitting and talking to someone 0  Sitting quietly after a lunch without alcohol 0  In a car, while stopped for a few minutes in traffic 0  Total score 7    PAST MEDICAL HISTORY :   has a past medical history of Anxiety, Arthritis, Arthritis of knee, Depression, Elevated BP, Heart murmur, Hypercholesterolemia, Hypertension, Increased BMI, Menopause, Morbid obesity (HCC), Oxygen deficiency, and Type I diabetes mellitus (HCC).  has a past surgical history that includes Cesarean section (1997); Breast biopsy (Left, 05/09/2020); Cataract extraction w/PHACO (Left, 11/08/2020); Cataract extraction w/PHACO (Right, 12/04/2020); Colonoscopy with propofol  (N/A, 01/18/2021); RIGHT/LEFT HEART CATH AND CORONARY ANGIOGRAPHY (Bilateral, 09/13/2024); CORONARY STENT INTERVENTION (N/A, 09/13/2024); and Eye surgery. Prior to Admission  medications   Medication Sig Start Date End Date Taking? Authorizing Provider  aspirin  81 MG chewable tablet Chew 1 tablet (81 mg total) by mouth daily. 09/15/24  Yes Carey, Mariah L, PA-C  BAYER MICROLET LANCETS lancets USE AS DIRECTED TO CHECK  SUGARS 1 TO 2 TIMES A DAY 01/15/18  Yes Glendia Shad, MD  carvedilol  (COREG ) 12.5 MG tablet Take 1 tablet (12.5 mg total) by mouth 2 (two) times daily. 09/05/24  Yes Dunn, Bernardino HERO, PA-C  cholecalciferol (VITAMIN D3) 25 MCG (1000 UT) tablet Take 1,000 Units by mouth daily.   Yes [provider]  clopidogrel (PLAVIX) 75 MG tablet Take 1 tablet (75 mg total) by mouth daily with breakfast. 09/27/24  Yes Dunn, Bernardino HERO, PA-C  CONTOUR NEXT TEST test strip USE TO CHECK BLOOD GLUCOSE 6 TO  7 TIMES DAILY 05/13/24  Yes Glendia Shad, MD  Cyanocobalamin  (B-12 PO) Take 2,000 mcg by mouth daily.   Yes [provider]  insulin  lispro (HUMALOG  KWIKPEN) 100 UNIT/ML KwikPen INJECT SUBCUTANEOUSLY 65 UNITS  DAILY (DISTRIBUTED OVER THREE  MEALS COUNTING CARBS) 09/27/24  Yes Glendia Shad, MD  Insulin  Pen Needle (BD PEN NEEDLE NANO ULTRAFINE) 32G X 4 MM MISC USE AS DIRECTED WITH INSULIN  4  TIMES DAILY 09/21/24  Yes Glendia Shad, MD  LANTUS  SOLOSTAR 100 UNIT/ML Solostar Pen INJECT SUBCUTANEOUSLY 80 UNITS  AT NIGHT Patient taking differently: Inject 20-60 Units into the skin See admin instructions. Take 60 units at 10 pm and 20 units a 6 am 02/17/24  Yes Glendia Shad, MD  levocetirizine (XYZAL ) 5 MG tablet TAKE 1 TABLET BY MOUTH  IN THE  EVENING Patient taking differently: Take 5 mg by mouth daily as needed for allergies. 05/13/24  Yes Glendia Shad, MD  lisinopril  (ZESTRIL ) 40 MG tablet TAKE 1 TABLET BY MOUTH DAILY 08/08/24  Yes Glendia Shad, MD  potassium chloride  (KLOR-CON  M) 10 MEQ tablet Take 10 mEq by mouth. 09/14/24  Yes [provider]  potassium chloride  (KLOR-CON ) 10 MEQ tablet One tablet daily, coinciding with torsemide  dosing 09/28/24   Yes Dunn, Ryan M, PA-C  REPATHA  SURECLICK 140 MG/ML SOAJ INJECT 1 PEN SUBCUTANEOUSLY  EVERY 2 WEEKS 12/30/23  Yes Glendia Shad, MD  sertraline  (ZOLOFT ) 50 MG tablet TAKE 1 TABLET BY MOUTH DAILY 01/22/24  Yes Glendia Shad, MD  torsemide  (DEMADEX ) 20 MG tablet One tablet daily 09/28/24  Yes Dunn, Bernardino HERO, PA-C   Allergies  Allergen Reactions   Codeine Nausea And Vomiting    FAMILY HISTORY:  family history includes Breast cancer in her maternal aunt and maternal grandmother; Cancer in her father; Colon cancer in her maternal grandfather and paternal grandfather; Colon polyps in her mother; Diabetes in her maternal grandfather; Hyperlipidemia in her mother; Hypertension in her mother. SOCIAL HISTORY:  reports that she has quit smoking. Her smoking use included cigarettes. She has a 10 pack-year smoking history. She has never used smokeless tobacco. She reports that she does not drink alcohol and does not use drugs.   Review of Systems:  Gen:  Denies  fever, sweats, chills weight loss  HEENT: Denies blurred vision, double vision, ear pain, eye pain, hearing loss, nose bleeds, sore throat Cardiac:  No dizziness, chest pain or heaviness, chest tightness,edema, No JVD Resp:   No cough, -sputum production, -shortness of breath,-wheezing, -hemoptysis,  Gi: Denies swallowing difficulty, stomach pain, nausea or vomiting, diarrhea, constipation, bowel incontinence Gu:  Denies bladder incontinence, burning urine Ext:   Denies Joint pain, stiffness or swelling Skin: Denies  skin rash, easy bruising or bleeding or hives Endoc:  Denies polyuria, polydipsia , polyphagia or weight change Psych:   Denies depression, insomnia or hallucinations  Other:  All other systems negative  VITAL SIGNS: BP 100/66   Pulse 74   Temp 98.3 F (36.8 C)   Ht 5' 4.25 (1.632 m)   Wt 271 lb (122.9 kg)   SpO2 96%   BMI 46.16 kg/m    Physical Examination:   General Appearance: No distress  EYES PERRLA, EOM  intact.   NECK Supple, No JVD Pulmonary: normal breath sounds, No wheezing.  CardiovascularNormal S1,S2.  No m/r/g.   Abdomen: Benign, Soft, non-tender. Skin:   warm, no rashes, no ecchymosis  Extremities: normal, no cyanosis, clubbing. Neuro:without focal findings,  speech normal  PSYCHIATRIC: Mood, affect within normal limits.   ASSESSMENT AND PLAN  OSA I suspect that OSA is likely present due to clinical presentation. Discussed the consequences of untreated sleep apnea. Advised not to drive drowsy for safety of patient and others. Will complete further evaluation with a home sleep study and follow up to review results.    CHF Stable, on current management. Following with cardiology.    MEDICATION ADJUSTMENTS/LABS AND TESTS ORDERED: Recommend Sleep Study   Patient  satisfied with Plan of action and management. All questions answered  Follow up to review HST results and treatment plan.   I spent a total of 34 minutes reviewing chart data, face-to-face evaluation with the patient, counseling and coordination of care as detailed above.    Chayil Gantt, M.D.  Sleep Medicine [facility]

## 2024-10-07 ENCOUNTER — Encounter: Payer: Self-pay | Admitting: Internal Medicine

## 2024-10-07 ENCOUNTER — Ambulatory Visit (INDEPENDENT_AMBULATORY_CARE_PROVIDER_SITE_OTHER): Admitting: Internal Medicine

## 2024-10-07 VITALS — BP 138/78 | HR 67 | Temp 98.0°F | Ht 64.25 in | Wt 267.8 lb

## 2024-10-07 DIAGNOSIS — D649 Anemia, unspecified: Secondary | ICD-10-CM | POA: Diagnosis not present

## 2024-10-07 DIAGNOSIS — Z1231 Encounter for screening mammogram for malignant neoplasm of breast: Secondary | ICD-10-CM

## 2024-10-07 DIAGNOSIS — I5032 Chronic diastolic (congestive) heart failure: Secondary | ICD-10-CM

## 2024-10-07 DIAGNOSIS — I272 Pulmonary hypertension, unspecified: Secondary | ICD-10-CM

## 2024-10-07 DIAGNOSIS — R944 Abnormal results of kidney function studies: Secondary | ICD-10-CM

## 2024-10-07 DIAGNOSIS — I2510 Atherosclerotic heart disease of native coronary artery without angina pectoris: Secondary | ICD-10-CM

## 2024-10-07 DIAGNOSIS — I10 Essential (primary) hypertension: Secondary | ICD-10-CM

## 2024-10-07 DIAGNOSIS — E1065 Type 1 diabetes mellitus with hyperglycemia: Secondary | ICD-10-CM | POA: Diagnosis not present

## 2024-10-07 DIAGNOSIS — M25561 Pain in right knee: Secondary | ICD-10-CM

## 2024-10-07 DIAGNOSIS — M25562 Pain in left knee: Secondary | ICD-10-CM

## 2024-10-07 DIAGNOSIS — E7800 Pure hypercholesterolemia, unspecified: Secondary | ICD-10-CM | POA: Diagnosis not present

## 2024-10-07 DIAGNOSIS — Z860100 Personal history of colon polyps, unspecified: Secondary | ICD-10-CM

## 2024-10-07 DIAGNOSIS — R600 Localized edema: Secondary | ICD-10-CM

## 2024-10-07 MED ORDER — LANTUS SOLOSTAR 100 UNIT/ML ~~LOC~~ SOPN: PEN_INJECTOR | SUBCUTANEOUS | 3 refills | Status: AC

## 2024-10-07 MED ORDER — INSULIN LISPRO (1 UNIT DIAL) 100 UNIT/ML (KWIKPEN): PEN_INJECTOR | SUBCUTANEOUS | 0 refills | Status: DC

## 2024-10-07 NOTE — Progress Notes (Signed)
 Subjective:    Patient ID: Jill Crosby, female    DOB: 1961-11-30, 63 y.o.   MRN: [id number]  Patient here for  Chief Complaint  Patient presents with   Diabetes Management Plan    Discuss A1C & insulin     HPI Here for a scheduled follow up. Follow up with pulmonary 10/04/24 - recommended sleep study. Planning home sleep study. Had f/u with cardiology 09/27/24 - Continue DAPT with aspirin  81 mg and clopidogrel 75 mg daily without interruption for a minimum of 6 months from date of PCI (09/13/2024). Continue repatha . Referred to cardiac rehab. Unable to attend rehab at this time due to her knee. Saw Dr Mardee. Has bone on bone. Recommended no increased walking. Is ambulating with walker. Has history of HFpEF/pulmonary hypertension and lower extremity edema: Volume status is improving.  RHC earlier this month showed mildly elevated left heart filling pressure, moderately elevated artery pressure, and severely elevated right heart filling pressure.  At time of discharge torsemide  was titrated to 40 mg alternating with 20 mg every other day. She recently had medication adjusted per cardiology - now on torsemide  20mg  q day. Reports sugars previously elevated - 200-300. She changed site where she was administering her insulin  - over the last couple of weeks.  Reports sugars recently - 120-130 in am and 130-140 in pm. She is currently taking 60 units lantus  in pm and 30 units in am. Using short acting insulin  with meals - approximately 15 units in am and 20 units with lunch and supper - may vary some. No significant problems with low sugars.    Past Medical History:  Diagnosis Date   Anxiety    Arthritis    Arthritis of knee    left knee   Depression    Elevated BP    Heart murmur    Hypercholesterolemia    Hypertension    Increased BMI    Menopause    Morbid obesity (HCC)    Oxygen deficiency    Type I diabetes mellitus (HCC)    Past Surgical History:  Procedure Laterality Date    BREAST BIOPSY Left 05/09/2020   ribbon clip, stereo bx, HEMANGIOMA, BENIGN. - MAMMARY EPITHELIUM IS NOT PRESENT.   CATARACT EXTRACTION W/PHACO Left 11/08/2020   Procedure: CATARACT EXTRACTION PHACO AND INTRAOCULAR LENS PLACEMENT (IOC) LEFT DIABETIC 4.14 00:52.1 ;  Surgeon: Jaye Fallow, MD;  Location: [facility];  Service: Ophthalmology;  Laterality: Left;   CATARACT EXTRACTION W/PHACO Right 12/04/2020   Procedure: CATARACT EXTRACTION PHACO AND INTRAOCULAR LENS PLACEMENT (IOC) RIGHT DIABETIC 6.11 00:46.1;  Surgeon: Jaye Fallow, MD;  Location: [facility];  Service: Ophthalmology;  Laterality: Right;  Diabetic - insulin    CESAREAN SECTION  1997   COLONOSCOPY WITH PROPOFOL  N/A 01/18/2021   Procedure: COLONOSCOPY WITH PROPOFOL ;  Surgeon: Jinny Carmine, MD;  Location: [facility];  Service: Endoscopy;  Laterality: N/A;  priority 4   CORONARY STENT INTERVENTION N/A 09/13/2024   Procedure: CORONARY STENT INTERVENTION;  Surgeon: Mady Bruckner, MD;  Location: [facility];  Service: Cardiovascular;  Laterality: N/A;   EYE SURGERY     RIGHT/LEFT HEART CATH AND CORONARY ANGIOGRAPHY Bilateral 09/13/2024   Procedure: RIGHT/LEFT HEART CATH AND CORONARY ANGIOGRAPHY;  Surgeon: Mady Bruckner, MD;  Location: [facility];  Service: Cardiovascular;  Laterality: Bilateral;   Family History  Problem Relation Age of Onset   Hypertension Mother    Hyperlipidemia Mother    Colon polyps Mother  Cancer Father        lung   Colon cancer Maternal Grandfather    Diabetes Maternal Grandfather    Breast cancer Maternal Grandmother    Colon cancer Paternal Grandfather    Breast cancer Maternal Aunt    Heart disease Neg Hx    Ovarian cancer Neg Hx    Social History   Socioeconomic History   Marital status: Single    Spouse name: Not on file   Number of children: Not on file   Years of education: Not on file   Highest education level: 12th grade   Occupational History   Not on file  Tobacco Use   Smoking status: Former    Current packs/day: 0.50    Average packs/day: 0.5 packs/day for 20.0 years (10.0 ttl pk-yrs)    Types: Cigarettes   Smokeless tobacco: Never  Substance and Sexual Activity   Alcohol use: No    Alcohol/week: 0.0 standard drinks of alcohol   Drug use: No   Sexual activity: Not Currently    Birth control/protection: Post-menopausal  Other Topics Concern   Not on file  Social History Narrative   Not on file   Social Drivers of Health   Financial Resource Strain: Low Risk  (10/03/2024)   Overall Financial Resource Strain (CARDIA)    Difficulty of Paying Living Expenses: Not very hard  Food Insecurity: No Food Insecurity (10/03/2024)   Hunger Vital Sign    Worried About Running Out of Food in the Last Year: Never true    Ran Out of Food in the Last Year: Never true  Transportation Needs: No Transportation Needs (10/03/2024)   PRAPARE - Administrator, Civil Service (Medical): No    Lack of Transportation (Non-Medical): No  Physical Activity: Inactive (10/03/2024)   Exercise Vital Sign    Days of Exercise per Week: 0 days    Minutes of Exercise per Session: Not on file  Stress: Stress Concern Present (10/03/2024)   [employer] of Occupational Health - Occupational Stress Questionnaire    Feeling of Stress: To some extent  Social Connections: Moderately Integrated (10/03/2024)   Social Connection and Isolation Panel    Frequency of Communication with Friends and Family: More than three times a week    Frequency of Social Gatherings with Friends and Family: Twice a week    Attends Religious Services: 1 to 4 times per year    Active Member of [employer] or Organizations: Yes    Attends Banker Meetings: 1 to 4 times per year    Marital Status: Divorced     Review of Systems  Constitutional:  Negative for appetite change and unexpected weight change.  HENT:  Negative for  congestion and sinus pressure.   Respiratory:  Negative for cough and chest tightness.        Breathing overall improved.   Cardiovascular:  Negative for chest pain and palpitations.       Leg swelling improved.   Gastrointestinal:  Negative for abdominal pain, diarrhea, nausea and vomiting.  Genitourinary:  Negative for difficulty urinating and dysuria.  Musculoskeletal:  Negative for myalgias.       Knee pain as outlined.   Skin:  Negative for color change and rash.  Neurological:  Negative for dizziness and headaches.  Psychiatric/Behavioral:  Negative for agitation and dysphoric mood.        Objective:     BP 138/78   Pulse 67   Temp 98 F (36.7  C) (Oral)   Ht 5' 4.25 (1.632 m)   Wt 267 lb 12 oz (121.5 kg)   SpO2 94%   BMI 45.60 kg/m  Wt Readings from Last 3 Encounters:  10/07/24 267 lb 12 oz (121.5 kg)  10/04/24 271 lb (122.9 kg)  09/27/24 263 lb (119.3 kg)    Physical Exam Vitals reviewed.  Constitutional:      General: She is not in acute distress.    Appearance: Normal appearance.  HENT:     Head: Normocephalic and atraumatic.     Right Ear: External ear normal.     Left Ear: External ear normal.     Mouth/Throat:     Pharynx: No oropharyngeal exudate or posterior oropharyngeal erythema.  Eyes:     General: No scleral icterus.       Right eye: No discharge.        Left eye: No discharge.     Conjunctiva/sclera: Conjunctivae normal.  Neck:     Thyroid: No thyromegaly.  Cardiovascular:     Rate and Rhythm: Normal rate and regular rhythm.  Pulmonary:     Effort: No respiratory distress.     Breath sounds: Normal breath sounds. No wheezing.  Abdominal:     General: Bowel sounds are normal.     Palpations: Abdomen is soft.     Tenderness: There is no abdominal tenderness.  Musculoskeletal:        General: No tenderness.     Cervical back: Neck supple. No tenderness.     Comments: Improved pedal and lower extremity swelling.   Lymphadenopathy:      Cervical: No cervical adenopathy.  Skin:    Findings: No erythema or rash.  Neurological:     Mental Status: She is alert.  Psychiatric:        Mood and Affect: Mood normal.        Behavior: Behavior normal.         Outpatient Encounter Medications as of 10/07/2024  Medication Sig   aspirin  81 MG chewable tablet Chew 1 tablet (81 mg total) by mouth daily.   BAYER MICROLET LANCETS lancets USE AS DIRECTED TO CHECK  SUGARS 1 TO 2 TIMES A DAY   carvedilol  (COREG ) 12.5 MG tablet Take 1 tablet (12.5 mg total) by mouth 2 (two) times daily.   cholecalciferol (VITAMIN D3) 25 MCG (1000 UT) tablet Take 1,000 Units by mouth daily.   clopidogrel (PLAVIX) 75 MG tablet Take 1 tablet (75 mg total) by mouth daily with breakfast.   CONTOUR NEXT TEST test strip USE TO CHECK BLOOD GLUCOSE 6 TO  7 TIMES DAILY   Cyanocobalamin  (B-12 PO) Take 2,000 mcg by mouth daily.   Insulin  Pen Needle (BD PEN NEEDLE NANO ULTRAFINE) 32G X 4 MM MISC USE AS DIRECTED WITH INSULIN  4  TIMES DAILY   levocetirizine (XYZAL ) 5 MG tablet TAKE 1 TABLET BY MOUTH IN THE  EVENING (Patient taking differently: Take 5 mg by mouth daily as needed for allergies.)   lisinopril  (ZESTRIL ) 40 MG tablet TAKE 1 TABLET BY MOUTH DAILY   potassium chloride  (KLOR-CON ) 10 MEQ tablet One tablet daily, coinciding with torsemide  dosing   REPATHA  SURECLICK 140 MG/ML SOAJ INJECT 1 PEN SUBCUTANEOUSLY  EVERY 2 WEEKS   sertraline  (ZOLOFT ) 50 MG tablet TAKE 1 TABLET BY MOUTH DAILY   torsemide  (DEMADEX ) 20 MG tablet One tablet daily   [DISCONTINUED] insulin  lispro (HUMALOG  KWIKPEN) 100 UNIT/ML KwikPen INJECT SUBCUTANEOUSLY 65 UNITS  DAILY (DISTRIBUTED OVER THREE  MEALS COUNTING CARBS)   [DISCONTINUED] LANTUS  SOLOSTAR 100 UNIT/ML Solostar Pen INJECT SUBCUTANEOUSLY 80 UNITS  AT NIGHT (Patient taking differently: Inject 20-60 Units into the skin See admin instructions. Take 60 units at 10 pm and 20 units a 6 am)   insulin  glargine (LANTUS  SOLOSTAR) 100 UNIT/ML  Solostar Pen INJECT SUBCUTANEOUSLY 60 UNITS AT NIGHT AND 30  UNITS IN MORNING   insulin  lispro (HUMALOG  KWIKPEN) 100 UNIT/ML KwikPen INJECT SUBCUTANEOUSLY 75 UNITS  DAILY (DISTRIBUTED OVER THREE  MEALS COUNTING CARBS)   [DISCONTINUED] potassium chloride  (KLOR-CON  M) 10 MEQ tablet Take 10 mEq by mouth.   No facility-administered encounter medications on file as of 10/07/2024.     Lab Results  Component Value Date   WBC 9.6 09/27/2024   HGB 12.8 09/27/2024   HCT 40.5 09/27/2024   PLT 289 09/27/2024   GLUCOSE 96 10/07/2024   CHOL 156 10/07/2024   TRIG 301 (H) 10/07/2024   HDL 50 10/07/2024   LDLDIRECT 32 01/03/2024   LDLCALC 59 10/07/2024   ALT 24 10/07/2024   AST 29 10/07/2024   NA 137 10/07/2024   K 4.4 10/07/2024   CL 95 (L) 10/07/2024   CREATININE 0.93 10/07/2024   BUN 18 10/07/2024   CO2 28 10/07/2024   TSH 2.010 04/27/2024   HGBA1C 8.3 (H) 10/07/2024    CARDIAC CATHETERIZATION Result Date: 09/13/2024 Conclusions: Severe single-vessel coronary artery disease with sequential 30% proximal and 95% mid LAD stenoses with TIMI-2 flow.  Minimal luminal irregularities noted in the LCx and RCA. Mildly elevated left heart filling pressures (PCWP 23, LVEDP 24 mmHg). Moderately elevated pulmonary artery pressure (PA 50/24, mean 33 mmHg; PVR 1.7 WU). Severely elevated right heart filling pressure mean RA 17, RV EDP 19 mmHg. Normal Fick cardiac output/index (CO 5.8 L/min, CI 2.6 L/min/m). Successful PCI to mid LAD using Onyx frontier 2.25 x 18 mm drug-eluting stent with 0% residual stenosis and TIMI-3 flow. Recommendations: Dual antiplatelet therapy with aspirin  and clopidogrel for at least 6 months. Escalate diuresis. Consider outpatient sleep study for evaluation of obstructive sleep apnea as well as advanced heart failure consultation. [name], MD [facility]      Assessment & Plan:  Encounter for screening mammogram for malignant neoplasm of breast -     3D Screening  Mammogram, Left and Right; Future  Decreased GFR -     Basic metabolic panel with GFR; Future  Hypercholesterolemia Assessment & Plan: On repatha .  Low cholesterol diet and exercise.  Check lipd panel today.  Lab Results  Component Value Date   CHOL 156 10/07/2024   HDL 50 10/07/2024   LDLCALC 59 10/07/2024   LDLDIRECT 32 01/03/2024   TRIG 301 (H) 10/07/2024   CHOLHDL 3.1 10/07/2024     Orders: -     Lipid panel; Future -     Hepatic function panel; Future  Type 1 diabetes mellitus with hyperglycemia (HCC) Assessment & Plan: Insulin  regiemen as outlined. Discussed CGM. No recorded sugar readings. Reports sugars as outlined. Reports over the last two weeks, improvement, since changing her site where she administers her insulin . Discussed low carb diet. Overdue labs. Check met b and A1c. Also discussed pharmacy referral to help with diabetes management and medication adjustment.   Orders: -     Hemoglobin A1c; Future -     Insulin  Lispro (1 Unit Dial ); INJECT SUBCUTANEOUSLY 75 UNITS  DAILY (DISTRIBUTED OVER THREE  MEALS COUNTING CARBS)  Dispense: 60 mL; Refill: 0 -  Microalbumin / creatinine urine ratio; Future -     AMB Referral [facility] Care Management  Anemia, unspecified type Assessment & Plan: Hgb 09/27/24 - wnl.    Chronic heart failure with preserved ejection fraction (HFpEF) (HCC) Assessment & Plan: Has history of HFpEF/pulmonary hypertension and lower extremity edema: Volume status is improving.  RHC earlier this month showed mildly elevated left heart filling pressure, moderately elevated artery pressure, and severely elevated right heart filling pressure.  At time of discharge torsemide  was titrated to 40 mg alternating with 20 mg every other day. She recently had medication adjusted per cardiology - now on torsemide  20mg  q day. Check metabolic panel today.    Coronary artery disease involving native coronary artery of native heart without angina pectoris Assessment  & Plan: S/p stent to mid LAD 09/13/24. Continue f/u with cardiology. Continue statin medication and DAPT with aspirin  and plavix.    Pulmonary hypertension (HCC) Assessment & Plan:  RHC earlier this month showed mildly elevated left heart filling pressure, moderately elevated artery pressure, and severely elevated right heart filling pressure.  At time of discharge torsemide  was titrated to 40 mg alternating with 20 mg every other day. She recently had medication adjusted per cardiology - now on torsemide  20mg  q day.  Saw pulmonary 10/04/24 - recommended sleep study. Planning HST.    Morbid obesity (HCC) Assessment & Plan: With associated diabetes and hypertension. Discussed low carb diet. Follow.    Lower extremity edema Assessment & Plan: Swelling much improved. Currently on torsemide  20mg  q day. Check metabolic panel today.    Pain in both knees, unspecified chronicity Assessment & Plan: Recently evaluated by Dr Mardee. Has bone on bone. Instructed to limit walking. Using a walker. Continue f/u with ortho. Unable to attend cardiac rehab at this time.    History of colonic polyps Assessment & Plan: Colonoscopy 01/2021 - normal (Dr Jinny).  Recommended f/u colonoscopy in 5 years.    Essential hypertension Assessment & Plan: Continue carvedilol , lisinopril  and toresemide 20mg  q day. Follow pressures. Follow metabolic panel. Check today.    Other orders -     Lantus  SoloStar; INJECT SUBCUTANEOUSLY 60 UNITS AT NIGHT AND 30  UNITS IN MORNING  Dispense: 75 mL; Refill: 3   I spent 45 minutes with the patient .  Time spent discussing her current concerns and symptoms. Specifically time spent discussed her blood sugars, insulin  doseing, cardiology evaluation and follow up  Time also spent discussing further w/up, evaluation and treatment.    Allena Hamilton, MD

## 2024-10-08 ENCOUNTER — Encounter: Payer: Self-pay | Admitting: Internal Medicine

## 2024-10-08 ENCOUNTER — Ambulatory Visit: Payer: Self-pay | Admitting: Internal Medicine

## 2024-10-08 LAB — BASIC METABOLIC PANEL WITH GFR
BUN/Creatinine Ratio: 19 (ref 12–28)
BUN: 18 mg/dL (ref 8–27)
CO2: 28 mmol/L (ref 20–29)
Calcium: 9.9 mg/dL (ref 8.7–10.3)
Chloride: 95 mmol/L — ABNORMAL LOW (ref 96–106)
Creatinine, Ser: 0.93 mg/dL (ref 0.57–1.00)
Glucose: 96 mg/dL (ref 70–99)
Potassium: 4.4 mmol/L (ref 3.5–5.2)
Sodium: 137 mmol/L (ref 134–144)
eGFR: 68 mL/min/1.73 (ref >59)

## 2024-10-08 LAB — HEPATIC FUNCTION PANEL
ALT: 24 IU/L (ref 0–32)
AST: 29 IU/L (ref 0–40)
Albumin: 4.2 g/dL (ref 3.9–4.9)
Alkaline Phosphatase: 60 IU/L (ref 49–135)
Bilirubin Total: 0.4 mg/dL (ref 0.0–1.2)
Bilirubin, Direct: 0.14 mg/dL (ref 0.00–0.40)
Total Protein: 7.4 g/dL (ref 6.0–8.5)

## 2024-10-08 LAB — HEMOGLOBIN A1C
Est. average glucose Bld gHb Est-mCnc: 192 mg/dL
Hgb A1c MFr Bld: 8.3 % — ABNORMAL HIGH (ref 4.8–5.6)

## 2024-10-08 LAB — LIPID PANEL
Chol/HDL Ratio: 3.1 ratio (ref 0.0–4.4)
Cholesterol, Total: 156 mg/dL (ref 100–199)
HDL: 50 mg/dL (ref >39)
LDL Chol Calc (NIH): 59 mg/dL (ref 0–99)
Triglycerides: 301 mg/dL — ABNORMAL HIGH (ref 0–149)
VLDL Cholesterol Cal: 47 mg/dL — ABNORMAL HIGH (ref 5–40)

## 2024-10-08 NOTE — Assessment & Plan Note (Signed)
 Continue carvedilol , lisinopril  and toresemide 20mg  q day. Follow pressures. Follow metabolic panel. Check today.

## 2024-10-08 NOTE — Assessment & Plan Note (Signed)
 S/p stent to mid LAD 09/13/24. Continue f/u with cardiology. Continue statin medication and DAPT with aspirin  and plavix.

## 2024-10-08 NOTE — Assessment & Plan Note (Signed)
 Swelling much improved. Currently on torsemide  20mg  q day. Check metabolic panel today.

## 2024-10-08 NOTE — Assessment & Plan Note (Signed)
 Recently evaluated by Dr Mardee. Has bone on bone. Instructed to limit walking. Using a walker. Continue f/u with ortho. Unable to attend cardiac rehab at this time.

## 2024-10-08 NOTE — Assessment & Plan Note (Signed)
 Insulin  regiemen as outlined. Discussed CGM. No recorded sugar readings. Reports sugars as outlined. Reports over the last two weeks, improvement, since changing her site where she administers her insulin . Discussed low carb diet. Overdue labs. Check met b and A1c. Also discussed pharmacy referral to help with diabetes management and medication adjustment.

## 2024-10-08 NOTE — Assessment & Plan Note (Signed)
Colonoscopy 01/2021 - normal (Dr Allen Norris).  Recommended f/u colonoscopy in 5 years.

## 2024-10-08 NOTE — Assessment & Plan Note (Signed)
 On repatha .  Low cholesterol diet and exercise.  Check lipd panel today.  Lab Results  Component Value Date   CHOL 156 10/07/2024   HDL 50 10/07/2024   LDLCALC 59 10/07/2024   LDLDIRECT 32 01/03/2024   TRIG 301 (H) 10/07/2024   CHOLHDL 3.1 10/07/2024

## 2024-10-08 NOTE — Assessment & Plan Note (Signed)
 Hgb 09/27/24 - wnl.

## 2024-10-08 NOTE — Assessment & Plan Note (Signed)
 RHC earlier this month showed mildly elevated left heart filling pressure, moderately elevated artery pressure, and severely elevated right heart filling pressure.  At time of discharge torsemide  was titrated to 40 mg alternating with 20 mg every other day. She recently had medication adjusted per cardiology - now on torsemide  20mg  q day.  Saw pulmonary 10/04/24 - recommended sleep study. Planning HST.

## 2024-10-08 NOTE — Assessment & Plan Note (Signed)
 With associated diabetes and hypertension. Discussed low carb diet. Follow.

## 2024-10-08 NOTE — Assessment & Plan Note (Signed)
 Has history of HFpEF/pulmonary hypertension and lower extremity edema: Volume status is improving.  RHC earlier this month showed mildly elevated left heart filling pressure, moderately elevated artery pressure, and severely elevated right heart filling pressure.  At time of discharge torsemide  was titrated to 40 mg alternating with 20 mg every other day. She recently had medication adjusted per cardiology - now on torsemide  20mg  q day. Check metabolic panel today.

## 2024-10-11 MED ORDER — FREESTYLE LIBRE 3 PLUS SENSOR MISC: 3 refills | Status: DC

## 2024-10-11 NOTE — Telephone Encounter (Signed)
 Rx sent in for CGM sensors - local pharmacy.

## 2024-10-14 ENCOUNTER — Telehealth: Payer: Self-pay

## 2024-10-14 NOTE — Progress Notes (Signed)
 Complex Care Management Note  Care Guide Note 10/14/2024 Name: Jill Crosby MRN: [id number] DOB: 12-24-1960  Jill Crosby is a 63 y.o. year old female who sees Glendia Shad, MD for primary care. I reached out to Jill Crosby by phone today to offer complex care management services.  Jill Crosby was given information about Complex Care Management services today including:   The Complex Care Management services include support from the care team which includes your Nurse Care Manager, Clinical Social Worker, or Pharmacist.  The Complex Care Management team is here to help remove barriers to the health concerns and goals most important to you. Complex Care Management services are voluntary, and the patient may decline or stop services at any time by request to their care team member.   Complex Care Management Consent Status: Patient agreed to services and verbal consent obtained.   Follow up plan:  Telephone appointment with complex care management team member scheduled for:  10/24/24 at 3:00 p.m.   Encounter Outcome:  Patient Scheduled  Jill Crosby [facility]  [facility], [facility] VBCI Assistant Direct Dial : [phone]  Fax: [phone]

## 2024-10-24 ENCOUNTER — Encounter

## 2024-10-24 ENCOUNTER — Other Ambulatory Visit

## 2024-10-24 DIAGNOSIS — E1065 Type 1 diabetes mellitus with hyperglycemia: Secondary | ICD-10-CM

## 2024-10-24 DIAGNOSIS — G4733 Obstructive sleep apnea (adult) (pediatric): Secondary | ICD-10-CM

## 2024-10-24 NOTE — Progress Notes (Unsigned)
 10/26/2024 Name: Jill Crosby MRN: [id number] DOB: 01/03/1961  Subjective  Chief Complaint  Patient presents with   Diabetes    Reason for visit: ?  Jill Crosby is a 63 y.o. female with a history of diabetes (type 1), who presents today for an initial diabetes pharmacotherapy visit. They were referred by their PCP for management of diabetes.   Pertinent PMH also includes HTN, HFpEF, CAD s/p stent placement, PAH, T1D, HLD, Anx hx tobacco use.  Care Team: Primary Care Provider: Glendia Shad, MD   Date of Last Diabetes Related Visit: with PCP on 10/07/24   Medication Access/Adherence: Prescription drug coverage: Payor: CIG[employer] / Plan: CIGNA MANAGED / Product Type: *No Product type* /  - Reports that all medications are affordable.       - $60/month for brand (Repatha )      - All testing supplies covered through work diabetes program  - Medication Adherence: Patient denies missing doses of their medication.     HPI:  Diagnosed at age 39. Has seen a few endocrinologists. Historically has tried a few different pumps. Notes she initially got her first pump when trying to conceive her first child. Did well with this. Had another child 7 years later with another successful pump experience. However, notes every time after her pump warrantly was up, her pump would stop working. Got fed up with pumps after the ~4th pump and has been managing with injections since. Injections remain her preference at present time.     Since Last visit / History of Present Illness: ?  Early in 2025, contracted Norovirus/Flu resulting in hyponatremia/ICU stay. Was out of work for several months. Ongoing issues with fluid accumulation and referred to cardiology. Blockage with stenting October 2025. She feels that all of this may have impacted her insulin  absorption (notes 30lb fluid accumulation).   Since this point, has been injecting insulin  in back of arms and feels her sugars are doing much better  with getting most of the excess fluid off.   Notes she is very comfortable with carb counting.  Has been through several nutrition classes and carb-counting classes.   She believes she is euvolemic in terms of how she feels, visual appearance of legs, though does not weigh at home. Notes she did buy a new scale.    Reported DM Regimen: ?  Lantus  30 units AM, 60 units HS Humalog  prior to snacks and meals (ICR1:3 + ISF1:15 down to 160)  Tests before meal. If elevated (~>160) gives enough to cover the high blood sugar and for the servings of carbs. Reports 5 units per 15 grams (ICR 1:3) ISF 1:15 (e.g. if 200 before lunch, will give enough to bring it down to 160)  SMBG: glucometer = Contour Next (checking 8 times daily) Freestyle Libre prescribed by PCP though [facility] told her it would cost $224.  Called insurance. Preference = Dexcom G7   Hypo/Hyperglycemia: Symptoms of hypoglycemia since last visit:? no  Symptoms of hyperglycemia since last visit:? no  Reported Diet: Patient typically eats regular meals with snacks between. Denies skipped meals.   DM Prevention:  Statin: Not taking History of chronic kidney disease? no History of albuminuria? yes, last UACR on 03/24/23 = 11 mg/g (improved from 40mg ) ACE/ARB - Taking lisinopril  40mg /d; Urine MA/CR Ratio - <30 mg/g.  Last eye exam: October 2025; No retinopathy present Last foot exam: 04/27/2024 Tobacco Use: Former smoker  Immunizations:? Flu: Up to date (Last: 09/14/2024); Pneumococcal: PCV20 (09/14/2024); Shingrix: No  Record - DUE  Cardiovascular Risk Reduction History of clinical ASCVD? yes History of heart failure? yes History of hyperlipidemia? yes Current BMI: 45.8 kg/m2 (Ht 5' 4.25, Wt 121.5 kg) Taking statin? no (Intolerant, on PCSK9i) Taking aspirin ? indicated (secondary prevention); Taking   Taking SGLT-2i? no Taking GLP- 1 RA? no      _______________________________________________  Objective    Review of  Systems:? Limited in the setting of virtual visit  GI:? No nausea, vomiting, constipation, diarrhea, abdominal pain, dyspepsia, change in bowel habits  Endocrine:? No polyuria, polyphagia or blurred vision    Physical Examination:  Vitals:  Wt Readings from Last 3 Encounters:  10/07/24 267 lb 12 oz (121.5 kg)  10/04/24 271 lb (122.9 kg)  09/27/24 263 lb (119.3 kg)   BP Readings from Last 3 Encounters:  10/07/24 138/78  10/04/24 100/66  09/27/24 138/77   Pulse Readings from Last 3 Encounters:  10/07/24 67  10/04/24 74  09/27/24 97   Labs:?  Lab Results  Component Value Date   HGBA1C 8.3 (H) 10/07/2024   HGBA1C 7.4 (H) 04/27/2024   HGBA1C 6.9 (H) 01/04/2024   GLUCOSE 96 10/07/2024   MICRALBCREAT 11 03/24/2023   MICRALBCREAT 40 (H) 03/26/2020   MICRALBCREAT 10.5 09/15/2018   CREATININE 0.93 10/07/2024   CREATININE 1.22 (H) 09/27/2024   CREATININE 0.89 09/14/2024   Lab Results  Component Value Date   CHOL 156 10/07/2024   LDLCALC 59 10/07/2024   LDLCALC 31 04/27/2024   LDLCALC 36 01/03/2024   LDLDIRECT 32 01/03/2024   HDL 50 10/07/2024   TRIG 301 (H) 10/07/2024   TRIG 255 (H) 04/27/2024   TRIG 82 01/03/2024   ALT 24 10/07/2024   ALT 31 04/27/2024   AST 29 10/07/2024   AST 30 04/27/2024     Chemistry      Component Value Date/Time   NA 137 10/07/2024 1214   K 4.4 10/07/2024 1214   CL 95 (L) 10/07/2024 1214   CO2 28 10/07/2024 1214   BUN 18 10/07/2024 1214   CREATININE 0.93 10/07/2024 1214      Component Value Date/Time   CALCIUM  9.9 10/07/2024 1214   ALKPHOS 60 10/07/2024 1214   AST 29 10/07/2024 1214   ALT 24 10/07/2024 1214   BILITOT 0.4 10/07/2024 1214     The 10-year ASCVD risk score (Arnett DK, et al., 2019) is: 14.7%  Assessment and Plan:   1. Diabetes, type 1: uncontrolled per last A1c of 8.3% (11/7), increased from previous, 7.4%. Goal <7% without hypoglycemia. Patient feels her sugars are back on track. Attributes increase in A1c in part to  recent concerns with significant fluid accumulation in the setting of HFpEF (now s/p PCI/stenting and stable on torsemide ).  Would like to try CGM though Herlene was not covered, insurance preference = Dexcom which she would like to try. CGM will also allow fine-tuning of her ICR/ISF. No changes to current regimen today, will re-evaluate after first week of CGM use.  TDD: 90 basal, 65-75 prandial = ~160 u/d on average ISF: 1800/160, 1700/160 = 10-11 (1 unit of insulin  ??BG 10-11 mg/dL) -- currently following ISF15 though only correcting down to 160. Re-evaluate if tighter PP control needed on CGM report.  ICR: 500/160 = 3.125 (1 unit insulin  per every ~3g carb)  Current Regimen: Lantus  30am, 60pm. Humalog  prior to meals/snacks (using ISF 15 to correct down to 160 before meals/snacks + ICR 1:3) HCM: UACR due. Eye exam not in chart though up to date  Oct 2025.  Continue medications today without changes Start Dexcom G7 (has smart phone) x88mo supply to [employer] Phone visit in ~2 weeks once delivered for more detailed CGM training.   Considerations:  Previously had >1 month extra Humalog  supply at home though pharmacy autofill missed a shipment recently. Now feels she is cutting it very close with each refill and does not feel comfortable not having that extra cushion at home. [employer] savings card may work to receive 30 day supply for [employer] Immediate supply may potentially work for a 1-time 58-month supply though this would only work for novolog , not for Humalog  (clinically/pharmacokinetically no difference with this, but something to keep in mind to avoid confusion).      2. HTN: uncontrolled based on last clinic BP of 138/78 mmHg (11/7), goal <130/80 mmHg, though previous visit 100/66. Denies lightheadedness, dizziness, SOB, CP, vision changes. Follows closely with cardiology.  Current Regimen: carvedilol  12.5 mg BID, lisinopril  40 mg/d, torsemide  20 mg/d Continue medications without changes.     3. ASCVD (secondary prevention, s/p DES Oct 2025): Relatively well-controlled on last lipid panel with LDL close to goal at 59 mg/dL, TG elevated at 698 mg/dL (88/2/74). LDL goal <55 mg/dL (secondary prevention). Repatha  affordable, still commercially insured. Unclear if using savings card already. Still on DAPT s/p recent stending. Denies c/f s/sx bleeding/bruising.  Key risk factors: diabetes, hypertension, hyperlipidemia (well-controlled), former smoker, BMI >30 kg/m2, and Known CAD (significant occlusion requiring DES Oct2025) Continue medications today without changes.  Repatha  Savings Card? May not be eligible since technically medicare-eligible per card criteria   4. Healthcare Maintenance:  Pneumococcal - Current status: Up to date, PCV20 08/2024 Shingles - Current status: No record. Discussed with patient today. She will check price with pharmacy Influenza - Current status: Up to date.   Due to receive the following vaccines: Tdap, Shingrix, and Covid Booster   Follow Up Scheduling Notes: Standard work hours 6am - 2:30 pm (free any day after 2:30pm, virtual/phone preferred) Follow up with clinical pharmacist via phone in 2 weeks for CGM training  Patient given direct line for questions regarding medication therapy  Future Appointments  Date Time Provider Department Center  11/07/2024  9:15 AM Abigail Bernardino HERO, PA-C [facility] None  11/08/2024  3:00 PM [facility]- PHARMACIST [facility] [address]  12/07/2024 11:00 AM [facility] [address]  12/08/2024 11:00 AM Glendia Shad, MD [facility] [address] FABIENE Geronimo, PharmD Clinical Pharmacist [facility] [phone]

## 2024-10-24 NOTE — Patient Instructions (Signed)
 Ms. Jill Crosby,   It was a pleasure to speak with you today! As we discussed:?   Continue all medications as you are currently taking them We will send a prescription for the Dexcom G7 sensors to [facility].  Regarding an emergency/backup supply of insulin , we discussed a couple of potential options: Humalog  may be available with a savings card for $35 x1 month supply of pens (billed without your insurance) Novolog  is the other rapid-acting insulin . Some patients are eligible for a 1-time emergency supply of Novolog  through a voucher/coupon.     ______________________________________________   Once your Dexcom sensors are delivered, we can touch base on the phone to walk through set up/training and to address any questions that you have about the sensors.  Below is general information regarding the Dexcom and how it works.  We will review all of this again once your sensors are delivered to don't feel the need to review in detial.   The App - DEXCOM Download the Dexcom G7 in the [employer])  Load the app and create a username and password (you will need to remember these, write down your login information somewhere you will find it)  How To Share Your Readings With Us  Once in the Dexcom app, go to Connections tab -> tap [facility] > Enter Clinic Code > [email or web address]     Sensor Application Apply Sensors only on the back of your upper arm. If placed in other areas, the sensor may not function properly and could give you inaccurate readings.  Avoid areas with scars, moles, stretch marks, or lumps.  Select an area of skin that generally stays flat during your normal daily activities (no bending or folding). To prevent discomfort or skin irritation, you should select a different site other than the one most recently used.   Wash application site using a plain soap or clean with an alcohol wipe. This will help remove any oily residue that may prevent  the sensor from sticking properly. Allow site to air dry before proceeding.  Note: The area MUST be clean and dry, or the Sensor may fall off too soon.   Unscrew the cap from the Sensor Applicator and set the cap aside. Place the Sensor Applicator over the prepared site and push down firmly against the skin. Then, press the button on the side of the application to apply the Sensor to your body. You will hear a click when the sensor is placed.  Gently pull the Sensor Applicator away from your body. The Sensor should now be attached to your skin. Make sure the Sensor is secure by lightly pressing the sensor against your skin.   Locate the adhesive patch (overpatch) tucked away in the instruction pamphlet. It has a bright green plastic which makes it easier to locate.  Remove the clear plastic to expose the sticky side of the patch, and place the over your sensor. This will help keep the sensor in place for the full 10-day period. Once placed, peel back the green plastic and discard. The patch should look like a white flexible fabric surrounding the sensor.     Each time you place a new sensor, you will be prompted to enter the 4-digit pairing code on the applicator you just used.  There will be a 30-minute warm-up period until the app will start to display your blood sugar readings.    What If My Sensor Falls Off or What If My Sensor Isn't  Working? Dexcom Technical Support Line: [phone] Or submit a request via the online form: [email or web address]  >  Submit product support request   How to Prevent Your Sensor From Falling Off Early? Make sure you don't have lotion on your arms/abdomen before applying the sensor.  Use alcohol to clean the area of oils. Make sure your skin is clean and dry before applying a new sensor. You can go to your local pharmacy and ask for Tegaderm, or something similar.  It is a clear, waterproof patch that you can lay over the sensor to help it to stay  on longer. Or you can order patches from amazon (or elsewhere online). Try searching Dexcom patch on [employer]. We also have some samples in clinic that you may try.       At any time, you may reach me via MyChart, or via my direct desk line, [phone].  You will most likely reach my voicemail, please leave at least your name and I will get back to you quickly.   Thank you!   Future Appointments  Date Time Provider Department Center  11/07/2024  9:15 AM Abigail Bernardino HERO, PA-C [facility] None  11/08/2024  3:00 PM [facility] PHARMACIST [facility] [address]  12/07/2024 11:00 AM [facility]  12/08/2024 11:00 AM Glendia Shad, MD [facility] [address] FABIENE Geronimo, PharmD Clinical Pharmacist [facility] [phone]

## 2024-10-25 MED ORDER — DEXCOM G7 SENSOR MISC: 3 refills | Status: AC

## 2024-10-26 ENCOUNTER — Encounter: Payer: Self-pay | Admitting: Pharmacist

## 2024-10-26 NOTE — Progress Notes (Signed)
 Chart Review Reason: Medication Access  Summary: Dexcom G7 sensor prescribed at pharmacy visit earlier this week.   Called [facility] to confirm cost. Representative confirms they have received the prescription and are showing a $0 copay.

## 2024-11-02 DIAGNOSIS — R069 Unspecified abnormalities of breathing: Secondary | ICD-10-CM | POA: Diagnosis not present

## 2024-11-03 ENCOUNTER — Ambulatory Visit: Payer: Self-pay

## 2024-11-03 DIAGNOSIS — G4733 Obstructive sleep apnea (adult) (pediatric): Secondary | ICD-10-CM

## 2024-11-06 NOTE — Progress Notes (Deleted)
 Cardiology Office Note    Date:  11/06/2024   ID:  Jill Crosby, DOB 09-02-1961, MRN [id number]  PCP:  Glendia Shad, MD  Cardiologist:  [name], MD  Electrophysiologist:  None   Chief Complaint: Follow up  History of Present Illness:   Jill Crosby is a 63 y.o. female with history of CAD status post PCI/DES to the mid LAD on 09/13/2024, HFpEF/pulmonary hypertension, type 1 diabetes, HTN, HLD, morbid obesity, and recently diagnosed OSA who presents for CAD and HFpEF.  She was evaluated as a new patient by Dr. Hanson on 05/18/2024 for progressive weight gain and swelling.  She had previously undergone an echo in 01/2024 during a hospital admission for altered mental status in the setting of severe hyponatremia and influenza that showed an EF of 55 to 60%, mildly dilated LV internal cavity size, mild LVH, grade 2 diastolic dysfunction, normal RV systolic function and ventricular cavity size, mildly dilated left atrium, mild mitral regurgitation, and a tricuspid aortic valve.  At the time of her visit in 05/2024, she reported a 17 pound weight gain over the preceding 6 weeks and was using compression stockings with modest improvement.  She had also been started on furosemide  20 mg daily as needed earlier that month that she was taking on a regular basis.  She reported having previously been on hydrochlorothiazide  leading up to her admission in 01/2024, though this had not been restarted given concern that it may have contributed to her significant hyponatremia (of note she also had a severe GI illness shortly before her admission and was only drinking water ).  It was felt her lower extremity edema was likely multifactorial with a degree of HFpEF possibly contributing given diastolic dysfunction noted on echo in 01/2024.  Furosemide  was titrated to 40 mg daily with recommendation to restrict excess fluid and sodium.  She followed up in the office on 06/22/2024 with her weight being up another  13 pounds today when compared to her visit in 05/2024.  She continued to note lower extremity swelling that was typically improved early in the morning and 1 progressive throughout the day.  She reported good urine output with furosemide  for 2 to 3 hours.  She also noted her lower extremity swelling became more pronounced after resuming work (works from home with her feet hanging down from the chair most of the day).  She did report wearing compression socks.  Labs obtained at that time showed stable renal function and electrolytes with recommendation for her to titrate furosemide  to 40 mg twice daily.  At follow-up in 05/2024, amlodipine  was discontinued and furosemide  was transitioned to torsemide , which was subsequently transitioned to 20 mg daily with mild renal dysfunction noted on follow-up lab.   She was seen in the office on 07/20/2024 with improvement in lower extremity swelling following the above changes.  She was without chest pain or shortness of breath, though did report a couple episodes of paroxysmal nocturnal dyspnea.  Subsequent Myocardial PET/CT on 08/25/2024 was intermediate risk with a medium defect with moderate reduction in uptake present in the apical anterior and apex location that was reversible with abnormal wall motion in the defect area consistent with ischemia.  LVEF normal.  Coronary artery calcification present in the LAD and LCx distributions.  In this setting, she underwent R/LHC on 09/13/2024 that showed severe single-vessel CAD with sequential 30% proximal and 95% mid LAD stenoses with minimal luminal irregularities noted in the LCx and RCA.  Mildly elevated  left heart filling pressure, moderately elevated pulmonary artery pressure, severely elevated right heart filling pressure, and normal cardiac output/index.  She underwent successful PCI/DES to the mid LAD.  She underwent IV diuresis while admitted.  At time of discharge torsemide  was increased to 40 mg alternating with 20 mg  every other day.  She followed up in the office in 08/2024 noting an improvement in dyspnea, lower extremity swelling, and abdominal distention following PCI and diuresis.  Follow-up labs at that time showed mild AKI with recommendation to reduce torsemide  back to 20 mg daily.  She was referred to sleep medicine as well with subsequent sleep study showing OSA..  ***   Labs independently reviewed: 10/2024 - BUN 18, serum creatinine 0.93, potassium 4.4, TC 156, TG 301, HDL 50, LDL 59, albumin 4.2, AST/ALT normal, A1c 8.3 08/2024 - Hgb 12.8, PLT 289, LP(a) < 8.4 03/2024 - TSH normal  Past Medical History:  Diagnosis Date   Anxiety    Arthritis    Arthritis of knee    left knee   Depression    Elevated BP    Heart murmur    Hypercholesterolemia    Hypertension    Increased BMI    Menopause    Morbid obesity (HCC)    Oxygen deficiency    Type I diabetes mellitus (HCC)     Past Surgical History:  Procedure Laterality Date   BREAST BIOPSY Left 05/09/2020   ribbon clip, stereo bx, HEMANGIOMA, BENIGN. - MAMMARY EPITHELIUM IS NOT PRESENT.   CATARACT EXTRACTION W/PHACO Left 11/08/2020   Procedure: CATARACT EXTRACTION PHACO AND INTRAOCULAR LENS PLACEMENT (IOC) LEFT DIABETIC 4.14 00:52.1 ;  Surgeon: Jaye Fallow, MD;  Location: [facility];  Service: Ophthalmology;  Laterality: Left;   CATARACT EXTRACTION W/PHACO Right 12/04/2020   Procedure: CATARACT EXTRACTION PHACO AND INTRAOCULAR LENS PLACEMENT (IOC) RIGHT DIABETIC 6.11 00:46.1;  Surgeon: Jaye Fallow, MD;  Location: [facility];  Service: Ophthalmology;  Laterality: Right;  Diabetic - insulin    CESAREAN SECTION  1997   COLONOSCOPY WITH PROPOFOL  N/A 01/18/2021   Procedure: COLONOSCOPY WITH PROPOFOL ;  Surgeon: Jinny Carmine, MD;  Location: [facility];  Service: Endoscopy;  Laterality: N/A;  priority 4   CORONARY STENT INTERVENTION N/A 09/13/2024   Procedure: CORONARY STENT INTERVENTION;  Surgeon: Mady Bruckner, MD;  Location: [facility] INVAS[facility] CV [facility];  Service: Cardiovascular;  Laterality: N/A;   EYE SURGERY     RIGHT/LEFT HEART CATH AND CORONARY ANGIOGRAPHY Bilateral 09/13/2024   Procedure: RIGHT/LEFT HEART CATH AND CORONARY ANGIOGRAPHY;  Surgeon: Mady Bruckner, MD;  Location: [facility] INVAS[facility] CV [facility];  Service: Cardiovascular;  Laterality: Bilateral;    Current Medications: No outpatient medications have been marked as taking for the 11/07/24 encounter (Appointment) with Jill Bernardino HERO, PA-C.    Allergies:   Codeine   Social History   Socioeconomic History   Marital status: Single    Spouse name: Not on file   Number of children: Not on file   Years of education: Not on file   Highest education level: 12th grade  Occupational History   Not on file  Tobacco Use   Smoking status: Former    Current packs/day: 0.50    Average packs/day: 0.5 packs/day for 20.0 years (10.0 ttl pk-yrs)    Types: Cigarettes   Smokeless tobacco: Never  Substance and Sexual Activity   Alcohol use: No    Alcohol/week: 0.0 standard drinks of alcohol   Drug use: No   Sexual activity: Not  Currently    Birth control/protection: Post-menopausal  Other Topics Concern   Not on file  Social History Narrative   Not on file   Social Drivers of Health   Financial Resource Strain: Low Risk  (10/03/2024)   Overall Financial Resource Strain (CARDIA)    Difficulty of Paying Living Expenses: Not very hard  Food Insecurity: No Food Insecurity (10/03/2024)   Hunger Vital Sign    Worried About Running Out of Food in the Last Year: Never true    Ran Out of Food in the Last Year: Never true  Transportation Needs: No Transportation Needs (10/03/2024)   PRAPARE - Administrator, Civil Service (Medical): No    Lack of Transportation (Non-Medical): No  Physical Activity: Inactive (10/03/2024)   Exercise Vital Sign    Days of Exercise per Week: 0 days    Minutes of Exercise per Session: Not on file  Stress:  Stress Concern Present (10/03/2024)   [employer] of Occupational Health - Occupational Stress Questionnaire    Feeling of Stress: To some extent  Social Connections: Moderately Integrated (10/03/2024)   Social Connection and Isolation Panel    Frequency of Communication with Friends and Family: More than three times a week    Frequency of Social Gatherings with Friends and Family: Twice a week    Attends Religious Services: 1 to 4 times per year    Active Member of [employer] or Organizations: Yes    Attends Engineer, Structural: 1 to 4 times per year    Marital Status: Divorced     Family History:  The patient's family history includes Breast cancer in her maternal aunt and maternal grandmother; Cancer in her father; Colon cancer in her maternal grandfather and paternal grandfather; Colon polyps in her mother; Diabetes in her maternal grandfather; Hyperlipidemia in her mother; Hypertension in her mother. There is no history of Heart disease or Ovarian cancer.  ROS:   12-point review of systems is negative unless otherwise noted in the HPI.   EKGs/Labs/Other Studies Reviewed:    Studies reviewed were summarized above. The additional studies were reviewed today:  [facility] 09/13/2024: Conclusions: Severe single-vessel coronary artery disease with sequential 30% proximal and 95% mid LAD stenoses with TIMI-2 flow.  Minimal luminal irregularities noted in the LCx and RCA. Mildly elevated left heart filling pressures (PCWP 23, LVEDP 24 mmHg). Moderately elevated pulmonary artery pressure (PA 50/24, mean 33 mmHg; PVR 1.7 WU). Severely elevated right heart filling pressure mean RA 17, RV EDP 19 mmHg. Normal Fick cardiac output/index (CO 5.8 L/min, CI 2.6 L/min/m). Successful PCI to mid LAD using Onyx frontier 2.25 x 18 mm drug-eluting stent with 0% residual stenosis and TIMI-3 flow.   Recommendations: Dual antiplatelet therapy with aspirin  and clopidogrel  for at least 6  months. Escalate diuresis. Consider outpatient sleep study for evaluation of obstructive sleep apnea as well as advanced heart failure consultation. __________   Myocardial PET/CT 08/25/2024:   Findings are consistent with apical ischemia. The study is intermediate risk due to medium size area of ischemia, and mildly reduced MBFR.   LV perfusion is abnormal. There is evidence of ischemia. There is no evidence of infarction. Defect 1: There is a medium defect with moderate reduction in uptake present in the apical anterior and apex location(s) that is reversible. There is abnormal wall motion in the defect area. Consistent with ischemia.   Rest left ventricular function is normal. Rest EF: 57%. Stress left ventricular function is normal. Stress  EF: 62%. End diastolic cavity size is normal. End systolic cavity size is normal. No evidence of transient ischemic dilation (TID) noted.   Myocardial blood flow was computed to be 0.71ml/g/min at rest and 1.21ml/g/min at stress. Global myocardial blood flow reserve was 1.70 and was mildly abnormal.   Coronary calcium  was present on the attenuation correction CT images. Mild coronary calcifications were present. Coronary calcifications were present in the left anterior descending artery and left circumflex artery distribution(s). __________   2D echo 01/05/2024: 1. Left ventricular ejection fraction, by estimation, is 55 to 60%. The  left ventricle has normal function. Left ventricular endocardial border  not optimally defined to evaluate regional wall motion. The left  ventricular internal cavity size was mildly  dilated. There is mild left ventricular hypertrophy. Left ventricular  diastolic parameters are consistent with Grade II diastolic dysfunction  (pseudonormalization).   2. Right ventricular systolic function is normal. The right ventricular  size is normal.   3. Left atrial size was mildly dilated.   4. The mitral valve is grossly normal. Mild  mitral valve regurgitation.  No evidence of mitral stenosis.   5. The aortic valve is tricuspid. Aortic valve regurgitation is not  visualized. No aortic stenosis is present.     EKG:  EKG is ordered today.  The EKG ordered today demonstrates ***  Recent Labs: 01/03/2024: B Natriuretic Peptide 239.2 01/10/2024: Magnesium  2.3 04/27/2024: TSH 2.010 09/27/2024: Hemoglobin 12.8; Platelets 289 10/07/2024: ALT 24; BUN 18; Creatinine, Ser 0.93; Potassium 4.4; Sodium 137  Recent Lipid Panel    Component Value Date/Time   CHOL 156 10/07/2024 1214   TRIG 301 (H) 10/07/2024 1214   HDL 50 10/07/2024 1214   CHOLHDL 3.1 10/07/2024 1214   CHOLHDL 2.1 01/03/2024 2036   VLDL 17 01/03/2024 2036   LDLCALC 59 10/07/2024 1214   LDLDIRECT 32 01/03/2024 2036    PHYSICAL EXAM:    VS:  There were no vitals taken for this visit.  BMI: There is no height or weight on file to calculate BMI.  Physical Exam  Wt Readings from Last 3 Encounters:  10/07/24 267 lb 12 oz (121.5 kg)  10/04/24 271 lb (122.9 kg)  09/27/24 263 lb (119.3 kg)     ASSESSMENT & PLAN:   CAD involving the native coronary arteries without angina:   HFpEF/pulmonary hypertension and lower extremity edema:   HTN: Blood pressure   HLD: LDL 59 with normal AST/ALT in 10/2024.   Obesity/OSA:    {Are you ordering a CV Procedure (e.g. stress test, cath, DCCV, TEE, etc)?   Press F2        :[id number]}     Disposition: F/u with Dr. Mady or an APP in ***.   Medication Adjustments/Labs and Tests Ordered: Current medicines are reviewed at length with the patient today.  Concerns regarding medicines are outlined above. Medication changes, Labs and Tests ordered today are summarized above and listed in the Patient Instructions accessible in Encounters.   Signed, Bernardino Bring, PA-C 11/06/2024 8:48 AM     [facility] [place] [address] [place], [place] [address] [phone]

## 2024-11-07 ENCOUNTER — Ambulatory Visit: Admitting: Physician Assistant

## 2024-11-07 DIAGNOSIS — R600 Localized edema: Secondary | ICD-10-CM

## 2024-11-07 DIAGNOSIS — Z6841 Body Mass Index (BMI) 40.0 and over, adult: Secondary | ICD-10-CM

## 2024-11-07 DIAGNOSIS — I5032 Chronic diastolic (congestive) heart failure: Secondary | ICD-10-CM

## 2024-11-07 DIAGNOSIS — I10 Essential (primary) hypertension: Secondary | ICD-10-CM

## 2024-11-07 DIAGNOSIS — G4730 Sleep apnea, unspecified: Secondary | ICD-10-CM

## 2024-11-07 DIAGNOSIS — E785 Hyperlipidemia, unspecified: Secondary | ICD-10-CM

## 2024-11-07 DIAGNOSIS — I272 Pulmonary hypertension, unspecified: Secondary | ICD-10-CM

## 2024-11-07 DIAGNOSIS — I2510 Atherosclerotic heart disease of native coronary artery without angina pectoris: Secondary | ICD-10-CM

## 2024-11-07 NOTE — Progress Notes (Unsigned)
 Cardiology Office Note    Date:  11/17/2024   ID:  SHEKINA CORDELL, DOB 1961-10-05, MRN [id number]  PCP:  Glendia Shad, MD  Cardiologist:  [name], MD  Electrophysiologist:  None   Chief Complaint: Follow up  History of Present Illness:   Jill Crosby is a 63 y.o. female with history of CAD status post PCI/DES to the mid LAD on 09/13/2024, HFpEF/pulmonary hypertension, type 1 diabetes, HTN, HLD, morbid obesity, and recently diagnosed OSA who presents for CAD and HFpEF.  She was evaluated as a new patient by Dr. Hanson on 05/18/2024 for progressive weight gain and swelling.  She had previously undergone an echo in 01/2024 during a hospital admission for altered mental status in the setting of severe hyponatremia and influenza that showed an EF of 55 to 60%, mildly dilated LV internal cavity size, mild LVH, grade 2 diastolic dysfunction, normal RV systolic function and ventricular cavity size, mildly dilated left atrium, mild mitral regurgitation, and a tricuspid aortic valve.  At the time of her visit in 05/2024, she reported a 17 pound weight gain over the preceding 6 weeks and was using compression stockings with modest improvement.  She had also been started on furosemide  20 mg daily as needed earlier that month that she was taking on a regular basis.  She reported having previously been on hydrochlorothiazide  leading up to her admission in 01/2024, though this had not been restarted given concern that it may have contributed to her significant hyponatremia (she also had a severe GI illness shortly before her admission and was only drinking water ).  It was felt her lower extremity edema was likely multifactorial with a degree of HFpEF possibly contributing given diastolic dysfunction noted on echo in 01/2024.  Furosemide  was titrated to 40 mg daily with recommendation to restrict excess fluid and sodium.  She followed up in the office on 06/22/2024 with her weight being up another 13  pounds today when compared to her visit in 05/2024.  She continued to note lower extremity swelling that was typically improved early in the morning and progressive throughout the day.  She reported good urine output with furosemide  for 2 to 3 hours.  She also noted her lower extremity swelling became more pronounced after resuming work (works from home with her feet hanging down from the chair most of the day).  She did report wearing compression socks.  Labs obtained at that time showed stable renal function and electrolytes with recommendation for her to titrate furosemide  to 40 mg twice daily.  At follow-up in 05/2024, amlodipine  was discontinued and furosemide  was transitioned to torsemide , which was subsequently transitioned to 20 mg daily with mild renal dysfunction noted on follow-up lab.   She was seen in the office on 07/20/2024 with improvement in lower extremity swelling following the above changes.  She was without chest pain or shortness of breath, though did report a couple episodes of paroxysmal nocturnal dyspnea.  Subsequent Myocardial PET/CT on 08/25/2024 was intermediate risk with a medium defect with moderate reduction in uptake present in the apical anterior and apex location that was reversible with abnormal wall motion in the defect area consistent with ischemia.  LVEF normal.  Coronary artery calcification present in the LAD and LCx distributions.  In this setting, she underwent R/LHC on 09/13/2024 that showed severe single-vessel CAD with sequential 30% proximal and 95% mid LAD stenoses with minimal luminal irregularities noted in the LCx and RCA.  Mildly elevated left heart filling  pressure, moderately elevated pulmonary artery pressure, severely elevated right heart filling pressure, and normal cardiac output/index.  She underwent successful PCI/DES to the mid LAD.  She underwent IV diuresis while admitted.  At time of discharge torsemide  was increased to 40 mg alternating with 20 mg every  other day.  She followed up in the office in 08/2024 noting an improvement in dyspnea, lower extremity swelling, and abdominal distention following PCI and diuresis.  Follow-up labs at that time showed mild AKI with recommendation to reduce torsemide  back to 20 mg daily.  She was referred to sleep medicine as well with subsequent sleep study showing OSA.  She comes in accompanied by her sister today and is doing well from a cardiac perspective, without symptoms of angina or cardiac decompensation.  Continues to feel like her breathing is improved when compared to prior.  She does relate she is holding onto a little bit of fluid in her abdomen.  Her weight is up 10 pounds today when compared to her visit in 08/2024.  No lower extremity swelling or progressive orthopnea.  She has taken an additional 20 mg of torsemide  a couple of times since she was last seen.  No falls or symptoms concerning for bleeding.  No dizziness, presyncope, or syncope.  Adherent to DAPT.  She reports developing a superficial abscess along the left axilla with spontaneous rupture on 12/17 that she would like evaluated today.  No fevers or chills.  Main limiting factor at this time is bilateral knee pain, for which she is hopeful to have surgery in the upcoming year.   Labs independently reviewed: 10/2024 - BUN 18, serum creatinine 0.93, potassium 4.4, TC 156, TG 301, HDL 50, LDL 59, albumin 4.2, AST/ALT normal, A1c 8.3 08/2024 - Hgb 12.8, PLT 289, LP(a) < 8.4 03/2024 - TSH normal  Past Medical History:  Diagnosis Date   Anxiety    Arthritis    Arthritis of knee    left knee   Depression    Elevated BP    Heart murmur    Hypercholesterolemia    Hypertension    Increased BMI    Menopause    Morbid obesity (HCC)    Oxygen deficiency    Type I diabetes mellitus (HCC)     Past Surgical History:  Procedure Laterality Date   BREAST BIOPSY Left 05/09/2020   ribbon clip, stereo bx, HEMANGIOMA, BENIGN. - MAMMARY EPITHELIUM  IS NOT PRESENT.   CATARACT EXTRACTION W/PHACO Left 11/08/2020   Procedure: CATARACT EXTRACTION PHACO AND INTRAOCULAR LENS PLACEMENT (IOC) LEFT DIABETIC 4.14 00:52.1 ;  Surgeon: Jaye Fallow, MD;  Location: [facility];  Service: Ophthalmology;  Laterality: Left;   CATARACT EXTRACTION W/PHACO Right 12/04/2020   Procedure: CATARACT EXTRACTION PHACO AND INTRAOCULAR LENS PLACEMENT (IOC) RIGHT DIABETIC 6.11 00:46.1;  Surgeon: Jaye Fallow, MD;  Location: [facility];  Service: Ophthalmology;  Laterality: Right;  Diabetic - insulin    CESAREAN SECTION  1997   COLONOSCOPY WITH PROPOFOL  N/A 01/18/2021   Procedure: COLONOSCOPY WITH PROPOFOL ;  Surgeon: Jinny Carmine, MD;  Location: [facility];  Service: Endoscopy;  Laterality: N/A;  priority 4   CORONARY STENT INTERVENTION N/A 09/13/2024   Procedure: CORONARY STENT INTERVENTION;  Surgeon: Mady Bruckner, MD;  Location: [facility] INVASIVE CV [facility];  Service: Cardiovascular;  Laterality: N/A;   EYE SURGERY     RIGHT/LEFT HEART CATH AND CORONARY ANGIOGRAPHY Bilateral 09/13/2024   Procedure: RIGHT/LEFT HEART CATH AND CORONARY ANGIOGRAPHY;  Surgeon: Mady Bruckner, MD;  Location:  [facility] INVAS[facility] CV [facility];  Service: Cardiovascular;  Laterality: Bilateral;    Current Medications: Current Meds  Medication Sig   aspirin  81 MG chewable tablet Chew 1 tablet (81 mg total) by mouth daily.   BAYER MICROLET LANCETS lancets USE AS DIRECTED TO CHECK  SUGARS 1 TO 2 TIMES A DAY   carvedilol  (COREG ) 12.5 MG tablet Take 1 tablet (12.5 mg total) by mouth 2 (two) times daily.   cholecalciferol (VITAMIN D3) 25 MCG (1000 UT) tablet Take 1,000 Units by mouth daily.   clopidogrel  (PLAVIX ) 75 MG tablet Take 1 tablet (75 mg total) by mouth daily with breakfast.   Continuous Glucose Sensor (DEXCOM G7 SENSOR) MISC Place new sensor every 10 days   CONTOUR NEXT TEST test strip USE TO CHECK BLOOD GLUCOSE 6 TO  7 TIMES DAILY   Cyanocobalamin  (B-12 PO) Take  2,000 mcg by mouth daily.   doxycycline  (VIBRA -TABS) 100 MG tablet Take 1 tablet (100 mg total) by mouth 2 (two) times daily for 7 days.   fluconazole  (DIFLUCAN ) 150 MG tablet Take 1 tablet (150 mg total) by mouth once for 1 dose. After you have completed doxycycline    insulin  glargine (LANTUS  SOLOSTAR) 100 UNIT/ML Solostar Pen INJECT SUBCUTANEOUSLY 60 UNITS AT NIGHT AND 30  UNITS IN MORNING   insulin  lispro (HUMALOG  KWIKPEN) 100 UNIT/ML KwikPen INJECT SUBCUTANEOUSLY 75 UNITS  DAILY (DISTRIBUTED OVER THREE  MEALS COUNTING CARBS)   Insulin  Pen Needle (BD PEN NEEDLE NANO ULTRAFINE) 32G X 4 MM MISC USE AS DIRECTED WITH INSULIN  4  TIMES DAILY   levocetirizine (XYZAL ) 5 MG tablet TAKE 1 TABLET BY MOUTH IN THE  EVENING (Patient taking differently: Take 5 mg by mouth daily as needed for allergies.)   lisinopril  (ZESTRIL ) 40 MG tablet TAKE 1 TABLET BY MOUTH DAILY   potassium chloride  (KLOR-CON ) 10 MEQ tablet One tablet daily, coinciding with torsemide  dosing   sertraline  (ZOLOFT ) 50 MG tablet TAKE 1 TABLET BY MOUTH DAILY   [DISCONTINUED] REPATHA  SURECLICK 140 MG/ML SOAJ INJECT 1 PEN SUBCUTANEOUSLY  EVERY 2 WEEKS   [DISCONTINUED] torsemide  (DEMADEX ) 20 MG tablet One tablet daily    Allergies:   Codeine   Social History   Socioeconomic History   Marital status: Single    Spouse name: Not on file   Number of children: Not on file   Years of education: Not on file   Highest education level: 12th grade  Occupational History   Not on file  Tobacco Use   Smoking status: Former    Current packs/day: 0.50    Average packs/day: 0.5 packs/day for 20.0 years (10.0 ttl pk-yrs)    Types: Cigarettes   Smokeless tobacco: Never  Substance and Sexual Activity   Alcohol use: No    Alcohol/week: 0.0 standard drinks of alcohol   Drug use: No   Sexual activity: Not Currently    Birth control/protection: Post-menopausal  Other Topics Concern   Not on file  Social History Narrative   Not on file   Social  Drivers of Health   Tobacco Use: Medium Risk (11/17/2024)   Patient History    Smoking Tobacco Use: Former    Smokeless Tobacco Use: Never    Passive Exposure: Not on file  Financial Resource Strain: Low Risk (10/03/2024)   Overall Financial Resource Strain (CARDIA)    Difficulty of Paying Living Expenses: Not very hard  Food Insecurity: No Food Insecurity (10/03/2024)   Epic    Worried About Radiation Protection Practitioner of Food in the  Last Year: Never true    Ran Out of Food in the Last Year: Never true  Transportation Needs: No Transportation Needs (10/03/2024)   Epic    Lack of Transportation (Medical): No    Lack of Transportation (Non-Medical): No  Physical Activity: Inactive (10/03/2024)   Exercise Vital Sign    Days of Exercise per Week: 0 days    Minutes of Exercise per Session: Not on file  Stress: Stress Concern Present (10/03/2024)   [employer] of Occupational Health - Occupational Stress Questionnaire    Feeling of Stress: To some extent  Social Connections: Moderately Integrated (10/03/2024)   Social Connection and Isolation Panel    Frequency of Communication with Friends and Family: More than three times a week    Frequency of Social Gatherings with Friends and Family: Twice a week    Attends Religious Services: 1 to 4 times per year    Active Member of Clubs or Organizations: Yes    Attends Banker Meetings: 1 to 4 times per year    Marital Status: Divorced  Depression (PHQ2-9): Low Risk (10/07/2024)   Depression (PHQ2-9)    PHQ-2 Score: 1  Alcohol Screen: Low Risk (05/12/2024)   Alcohol Screen    Last Alcohol Screening Score (AUDIT): 0  Housing: Low Risk (10/03/2024)   Epic    Unable to Pay for Housing in the Last Year: No    Number of Times Moved in the Last Year: 0    Homeless in the Last Year: No  Utilities: Not At Risk (09/13/2024)   Epic    Threatened with loss of utilities: No  Health Literacy: Not on file     Family History:  The patient's family  history includes Breast cancer in her maternal aunt and maternal grandmother; Cancer in her father; Colon cancer in her maternal grandfather and paternal grandfather; Colon polyps in her mother; Diabetes in her maternal grandfather; Hyperlipidemia in her mother; Hypertension in her mother. There is no history of Heart disease or Ovarian cancer.  ROS:   12-point review of systems is negative unless otherwise noted in the HPI.   EKGs/Labs/Other Studies Reviewed:    Studies reviewed were summarized above. The additional studies were reviewed today:  [facility] 09/13/2024: Conclusions: Severe single-vessel coronary artery disease with sequential 30% proximal and 95% mid LAD stenoses with TIMI-2 flow.  Minimal luminal irregularities noted in the LCx and RCA. Mildly elevated left heart filling pressures (PCWP 23, LVEDP 24 mmHg). Moderately elevated pulmonary artery pressure (PA 50/24, mean 33 mmHg; PVR 1.7 WU). Severely elevated right heart filling pressure mean RA 17, RV EDP 19 mmHg. Normal Fick cardiac output/index (CO 5.8 L/min, CI 2.6 L/min/m). Successful PCI to mid LAD using Onyx frontier 2.25 x 18 mm drug-eluting stent with 0% residual stenosis and TIMI-3 flow.   Recommendations: Dual antiplatelet therapy with aspirin  and clopidogrel  for at least 6 months. Escalate diuresis. Consider outpatient sleep study for evaluation of obstructive sleep apnea as well as advanced heart failure consultation. __________   Myocardial PET/CT 08/25/2024:   Findings are consistent with apical ischemia. The study is intermediate risk due to medium size area of ischemia, and mildly reduced MBFR.   LV perfusion is abnormal. There is evidence of ischemia. There is no evidence of infarction. Defect 1: There is a medium defect with moderate reduction in uptake present in the apical anterior and apex location(s) that is reversible. There is abnormal wall motion in the defect area. Consistent with ischemia.  Rest left  ventricular function is normal. Rest EF: 57%. Stress left ventricular function is normal. Stress EF: 62%. End diastolic cavity size is normal. End systolic cavity size is normal. No evidence of transient ischemic dilation (TID) noted.   Myocardial blood flow was computed to be 0.71ml/g/min at rest and 1.21ml/g/min at stress. Global myocardial blood flow reserve was 1.70 and was mildly abnormal.   Coronary calcium  was present on the attenuation correction CT images. Mild coronary calcifications were present. Coronary calcifications were present in the left anterior descending artery and left circumflex artery distribution(s). __________   2D echo 01/05/2024: 1. Left ventricular ejection fraction, by estimation, is 55 to 60%. The  left ventricle has normal function. Left ventricular endocardial border  not optimally defined to evaluate regional wall motion. The left  ventricular internal cavity size was mildly  dilated. There is mild left ventricular hypertrophy. Left ventricular  diastolic parameters are consistent with Grade II diastolic dysfunction  (pseudonormalization).   2. Right ventricular systolic function is normal. The right ventricular  size is normal.   3. Left atrial size was mildly dilated.   4. The mitral valve is grossly normal. Mild mitral valve regurgitation.  No evidence of mitral stenosis.   5. The aortic valve is tricuspid. Aortic valve regurgitation is not  visualized. No aortic stenosis is present.     EKG:  EKG is ordered today.  The EKG ordered today demonstrates NSR, 60 bpm, low voltage QRS, nonspecific ST-T changes, consistent with prior tracing  Recent Labs: 01/03/2024: B Natriuretic Peptide 239.2 01/10/2024: Magnesium  2.3 04/27/2024: TSH 2.010 09/27/2024: Hemoglobin 12.8; Platelets 289 10/07/2024: ALT 24; BUN 18; Creatinine, Ser 0.93; Potassium 4.4; Sodium 137  Recent Lipid Panel    Component Value Date/Time   CHOL 156 10/07/2024 1214   TRIG 301 (H) 10/07/2024  1214   HDL 50 10/07/2024 1214   CHOLHDL 3.1 10/07/2024 1214   CHOLHDL 2.1 01/03/2024 2036   VLDL 17 01/03/2024 2036   LDLCALC 59 10/07/2024 1214   LDLDIRECT 32 01/03/2024 2036    PHYSICAL EXAM:    VS:  BP 116/62 (BP Location: Left Arm, Patient Position: Sitting, Cuff Size: Large)   Pulse 63   Ht 5' 4.25 (1.632 m)   Wt 273 lb (123.8 kg)   SpO2 97%   BMI 46.50 kg/m   BMI: Body mass index is 46.5 kg/m.  Physical Exam Vitals reviewed.  Constitutional:      Appearance: She is well-developed.  HENT:     Head: Normocephalic and atraumatic.  Eyes:     General:        Right eye: No discharge.        Left eye: No discharge.  Cardiovascular:     Rate and Rhythm: Normal rate and regular rhythm.     Heart sounds: Normal heart sounds, S1 normal and S2 normal. Heart sounds not distant. No midsystolic click and no opening snap. No murmur heard.    No friction rub.  Pulmonary:     Effort: Pulmonary effort is normal. No respiratory distress.     Breath sounds: Normal breath sounds. No decreased breath sounds, wheezing, rhonchi or rales.  Chest:     Chest wall: No tenderness.  Musculoskeletal:     Cervical back: Normal range of motion.     Right lower leg: No edema.     Left lower leg: No edema.  Skin:    General: Skin is warm and dry.     Nails: There is  no clubbing.     Comments: Superficial 2 cm erythematous lesion along the left axilla without active draining.  Neurological:     Mental Status: She is alert and oriented to person, place, and time.  Psychiatric:        Speech: Speech normal.        Behavior: Behavior normal.        Thought Content: Thought content normal.        Judgment: Judgment normal.     Wt Readings from Last 3 Encounters:  11/17/24 273 lb (123.8 kg)  10/07/24 267 lb 12 oz (121.5 kg)  10/04/24 271 lb (122.9 kg)     ASSESSMENT & PLAN:   CAD involving the native coronary arteries without angina: She is doing well and without symptoms concerning for  angina.  Continue DAPT with aspirin  81 mg and clopidogrel  75 mg daily without interruption for a minimum of 6 months from date of PCI (09/13/2024).  She will otherwise remain on Repatha .  No indication for further ischemic testing at this time.  Has been referred to cardiac rehab.  HFpEF/pulmonary hypertension and lower extremity edema: Volume status continues to be improved from prior to cardiac cath.  However, her weight is up 10 pounds today when compared to her visit in 08/2024.  She does continue to appear volume up with recommendation to continue torsemide  20 mg daily with an additional 20 mg as needed.  Check BMP today.  Defer addition of SGLT2 inhibitor given concern for off target effect.  In follow-up, if labs allow would look to add spironolactone.  Recommend she follow-up with sleep medicine for fitting of CPAP as outlined below.  HTN: Blood pressure is well-controlled in the office today.  Remains on carvedilol  12.5 mg twice daily and lisinopril  40 mg.  Check BMP.  HLD: LDL 59 with normal AST/ALT in 10/2024.  Remains on Repatha  140 mg injected subcutaneously every 2 weeks.  Obesity/OSA: Weight loss is recommended throughout healthy diet and regular exercise.  Awaiting CPAP fitting.  Management with sleep apnea with CPAP will assist with treating right heart pressures.  Left axillary abscess: Status post spontaneous rupture on 12/17.  No fevers or chills.  Will give a 7-day course of doxycycline  100 mg twice daily followed by fluconazole  150 mg x 1.  Check CBC.  If lesion is not improving recommend she follow-up with PCP.      Disposition: F/u with Dr. Mady or an APP in 2 months.   Medication Adjustments/Labs and Tests Ordered: Current medicines are reviewed at length with the patient today.  Concerns regarding medicines are outlined above. Medication changes, Labs and Tests ordered today are summarized above and listed in the Patient Instructions accessible in Encounters.    Signed, Bernardino Bring, PA-C 11/17/2024 9:53 AM     [facility] [place] [address] [place], [place] [address] [phone]

## 2024-11-08 ENCOUNTER — Other Ambulatory Visit: Admitting: Pharmacist

## 2024-11-08 DIAGNOSIS — E1065 Type 1 diabetes mellitus with hyperglycemia: Secondary | ICD-10-CM

## 2024-11-08 NOTE — Progress Notes (Signed)
 11/08/2024 Name: Jill Crosby MRN: [id number] DOB: 09-27-1961  Subjective  Chief Complaint  Patient presents with   Diabetes   Reason for visit: ?  Jill Crosby is a 63 y.o. female with a history of diabetes (type 1), who presents today for a follow up diabetes visit to review Dexcom/CGM training.?  Care Team: Primary Care Provider: Glendia Shad, MD  Since Last visit / History of Present Illness: ?  Patient was able to pick up new Dexcom G7 which was covered by her insurance for $0.   Patient does have a compatible smart phone (iPhone)    _______________________________________________  Objective     Physical Examination: Vitals: Wt Readings from Last 3 Encounters:  10/07/24 267 lb 12 oz (121.5 kg)  10/04/24 271 lb (122.9 kg)  09/27/24 263 lb (119.3 kg)   BP Readings from Last 3 Encounters:  10/07/24 138/78  10/04/24 100/66  09/27/24 138/77   Pulse Readings from Last 3 Encounters:  10/07/24 67  10/04/24 74  09/27/24 97   Labs:?  Lab Results  Component Value Date   HGBA1C 8.3 (H) 10/07/2024   HGBA1C 7.4 (H) 04/27/2024   HGBA1C 6.9 (H) 01/04/2024   GLUCOSE 96 10/07/2024   MICRALBCREAT 11 03/24/2023   MICRALBCREAT 40 (H) 03/26/2020   MICRALBCREAT 10.5 09/15/2018   CREATININE 0.93 10/07/2024   CREATININE 1.22 (H) 09/27/2024   CREATININE 0.89 09/14/2024   Lab Results  Component Value Date   CHOL 156 10/07/2024   LDLCALC 59 10/07/2024   LDLCALC 31 04/27/2024   LDLCALC 36 01/03/2024   LDLDIRECT 32 01/03/2024   HDL 50 10/07/2024   TRIG 301 (H) 10/07/2024   TRIG 255 (H) 04/27/2024   TRIG 82 01/03/2024   ALT 24 10/07/2024   ALT 31 04/27/2024   AST 29 10/07/2024   AST 30 04/27/2024      Chemistry      Component Value Date/Time   NA 137 10/07/2024 1214   K 4.4 10/07/2024 1214   CL 95 (L) 10/07/2024 1214   CO2 28 10/07/2024 1214   BUN 18 10/07/2024 1214   CREATININE 0.93 10/07/2024 1214      Component Value Date/Time   CALCIUM  9.9  10/07/2024 1214   ALKPHOS 60 10/07/2024 1214   AST 29 10/07/2024 1214   ALT 24 10/07/2024 1214   BILITOT 0.4 10/07/2024 1214     The 10-year ASCVD risk score (Arnett DK, et al., 2019) is: 14.7%  Assessment and Plan:   1. Diabetes, type 1: Continuous glucose monitoring   Dexcom CGM training provided: System overview of Dexcom G7 given included 10 day sensor wear and appropriate site placements Reviewed trend arrows and treament decisions, along with noting a 5-10 minute delay in readings and to always confirm low glucose alarms with glucometer if no symptoms.  Reviewed possibility of false compression lows Reviewed troubleshoot guides and to call [facility] Customer Support for technical issues or difficulties. Dexcom App installed on patient's phone and set up. Connected to Cl[employer] portal.    Patient successfully applied sensor under supervision of PharmD. Mobile app/Receiver linked with Dexcom Clarity.   Follow Up Patient given direct line for questions/concerns.  Follow up in 1-2 week for CGM review/interpretation   Future Appointments  Date Time Provider Department Center  11/16/2024  3:00 PM [facility]- PHARMACIST [facility] [id number]  11/17/2024  8:50 AM Abigail Bernardino HERO, PA-C [facility] None  12/07/2024 11:00 AM [facility]  12/08/2024 11:00 AM Glendia Shad,  MD [facility] [id number] FABIENE Geronimo, PharmD Clinical Pharmacist [facility] [phone]

## 2024-11-08 NOTE — Patient Instructions (Signed)
 Ms. Jill Crosby,   It was a pleasure to speak with you today! As we discussed:?  The App - DEXCOM Dexcom G7 App   Sensor Application Apply Sensors only on the back of your upper arm. If placed in other areas, the sensor may not function properly and could give you inaccurate readings.  Avoid areas with scars, moles, stretch marks, or lumps.  Select an area of skin that generally stays flat during your normal daily activities (no bending or folding). To prevent discomfort or skin irritation, you should select a different site other than the one most recently used.   Wash application site using a plain soap or clean with an alcohol wipe. This will help remove any oily residue that may prevent the sensor from sticking properly. Allow site to air dry before proceeding.  Note: The area MUST be clean and dry, or the Sensor may fall off too soon.   Unscrew the cap from the Sensor Applicator and set the cap aside. Place the Sensor Applicator over the prepared site and push down firmly against the skin. Then, press the button on the side of the application to apply the Sensor to your body. You will hear a click when the sensor is placed.  Gently pull the Sensor Applicator away from your body. The Sensor should now be attached to your skin. Make sure the Sensor is secure by lightly pressing the sensor against your skin.   Locate the adhesive patch (overpatch) tucked away in the instruction pamphlet. It has a bright green plastic which makes it easier to locate.  Remove the clear plastic to expose the sticky side of the patch, and place the over your sensor. This will help keep the sensor in place for the full 10-day period. Once placed, peel back the green plastic and discard. The patch should look like a white flexible fabric surrounding the sensor.     Each time you place a new sensor, you will be prompted to enter the 4-digit pairing code on the applicator you just used.  There will be a  30-minute warm-up period until the app will start to display your blood sugar readings.    What If My Sensor Falls Off or What If My Sensor Isn't Working? Dexcom Technical Support Line: [phone] Or submit a request via the online form: [email or web address]  >  Submit product support request   How to Prevent Your Sensor From Falling Off Early? Make sure you don't have lotion on your arms/abdomen before applying the sensor.  Use alcohol to clean the area of oils. Make sure your skin is clean and dry before applying a new sensor. You can go to your local pharmacy and ask for Tegaderm, or something similar.  It is a clear, waterproof patch that you can lay over the sensor to help it to stay on longer. Or you can order patches from amazon (or elsewhere online). Try searching Dexcom patch on Amazon. We also have some samples in clinic that you may try.    We will plan to follow up in ~1 week to see how things are going with the new sensor, and to review your first week of blood sugar readings.   In the meantime, if you have any questions/concerns, You may respond directly to this message, or leave me a voicemail at [phone] and I will get back to you shortly.   Future Appointments  Date Time Provider Department Center  11/16/2024  3:00 PM [facility] PHARMACIST [facility]  [address]  11/17/2024  8:50 AM Abigail Bernardino HERO, PA-C [facility] None  12/07/2024 11:00 AM [facility]  12/08/2024 11:00 AM Glendia Shad, MD [facility] [address]

## 2024-11-16 ENCOUNTER — Other Ambulatory Visit

## 2024-11-16 DIAGNOSIS — E1065 Type 1 diabetes mellitus with hyperglycemia: Secondary | ICD-10-CM

## 2024-11-16 NOTE — Progress Notes (Signed)
 11/16/2024 Name: Jill Crosby MRN: [id number] DOB: 27-Dec-1960  Subjective  Chief Complaint  Patient presents with   Diabetes   Reason for visit: ?  Jill Crosby is a 63 y.o. female with a history of diabetes (type 1), who presents today for a follow up diabetes pharmacotherapy visit to review first week of data with new CGM device. They were referred by their PCP for management of diabetes.   Pertinent PMH also includes HTN, HFpEF, CAD s/p stent placement, PAH, T1D, HLD, Anxiety, hx tobacco use.  Care Team: Primary Care Provider: Glendia Shad, MD   Date of Last Diabetes Related Visit: with PCP on 10/07/24; With PharmD 12/9 Recent summary of change 12/9: New CGM. Glucometer > Dexcom G7. linked to [facility]  Medication Access/Adherence: Prescription drug coverage: Payor: CIG[employer] / Plan: CIGNA MANAGED / Product Type: *No Product type* /  - Reports that all medications are affordable.       - $60/month for brand (Repatha )      - All testing supplies covered through work diabetes program for $0 - Medication Adherence: Patient denies missing doses of their medication.    HPI:  Diagnosed at age 14. Has seen a few endocrinologists. Historically has tried a few different pumps. Notes she initially got her first pump when trying to conceive her first child. Did well with this. Had another child 7 years later with another successful pump experience. However, notes every time after her pump warrantly was up, her pump would stop working. Got fed up with pumps after the ~4th pump and has been managing with injections since. Injections remain her preference at present time.   Since Last visit: ?  Patient reports doing well with the Dexcom over the past week. Has been very pleasantly surprised and has been enjoying the sensor. Changed her low alarm to 80. Likes to treat around 59 which has been useful for her. High alarms remain off. Only issue so far = no signal notification  occasionally even when in the same room.   **Notes her employer has changed insurance carrier from [employer] for 2026. Is unsure of specific plan, though states insurer is now [employer]   Reported DM Regimen: ?  Lantus  30 units AM, 60 units HS Humalog  prior to snacks and meals (ICR1:3 + ISF1:15 down to 160)  Tests before meal. If elevated (~>160) gives enough to cover the high blood sugar and for the servings of carbs. Reports 5 units per 15 grams (ICR 1:3) ISF 1:15 (e.g. if 200 before lunch, will give enough to bring it down to 160)  SMBG: Dexcom G7 (Linked to [facility]) CGM Report (Date Range 12/4 - 12/17) ABG 172 mg/dL; GMI 2.5%; Variability 29.5%; TIR 59%, high 33%, very high 8%, low 0%, very low 0%. Time active 87%    Hypo/Hyperglycemia: Symptoms of hypoglycemia since last visit:? no (though 2 instances of high 60s on Dexcom reports 2/2 correctional insulin  w/o eating. Sometimes forgets to give insulin  prior to meal and corrects for high sugars later on).  Symptoms of hyperglycemia since last visit:? no  Reported Diet: Patient typically eats regular meals with snacks between. Denies skipped meals.  Goal:  Sometimes eats without using insulin , especially since transition to work from home   DM Prevention:  Statin: Not taking History of chronic kidney disease? no History of albuminuria? yes, last UACR on 03/24/23 = 11 mg/g (improved from 40mg ) ACE/ARB - Taking lisinopril  40mg /d; Urine MA/CR Ratio - <30 mg/g.  Last  eye exam: October 2025; No retinopathy present Last foot exam: 04/27/2024 Tobacco Use: Former smoker  Immunizations:? Flu: Up to date (Last: 09/14/2024); Pneumococcal: PCV20 (09/14/2024); Shingrix: No Record - DUE  Cardiovascular Risk Reduction History of clinical ASCVD? yes History of heart failure? yes History of hyperlipidemia? yes Current BMI: 45.8 kg/m2 (Ht 5' 4.25, Wt 121.5 kg) Taking statin? no (Intolerant, on PCSK9i) Taking aspirin ? indicated (secondary  prevention); Taking   Taking SGLT-2i? no Taking GLP- 1 RA? no      _______________________________________________  Objective    Review of Systems:? Limited in the setting of virtual visit  GI:? No nausea, vomiting, constipation, diarrhea, abdominal pain, dyspepsia, change in bowel habits  Endocrine:? No polyuria, polyphagia or blurred vision    Physical Examination:  Vitals:  Wt Readings from Last 3 Encounters:  10/07/24 267 lb 12 oz (121.5 kg)  10/04/24 271 lb (122.9 kg)  09/27/24 263 lb (119.3 kg)   BP Readings from Last 3 Encounters:  10/07/24 138/78  10/04/24 100/66  09/27/24 138/77   Pulse Readings from Last 3 Encounters:  10/07/24 67  10/04/24 74  09/27/24 97   Labs:?  Lab Results  Component Value Date   HGBA1C 8.3 (H) 10/07/2024   HGBA1C 7.4 (H) 04/27/2024   HGBA1C 6.9 (H) 01/04/2024   GLUCOSE 96 10/07/2024   MICRALBCREAT 11 03/24/2023   MICRALBCREAT 40 (H) 03/26/2020   MICRALBCREAT 10.5 09/15/2018   CREATININE 0.93 10/07/2024   CREATININE 1.22 (H) 09/27/2024   CREATININE 0.89 09/14/2024   Lab Results  Component Value Date   CHOL 156 10/07/2024   LDLCALC 59 10/07/2024   LDLCALC 31 04/27/2024   LDLCALC 36 01/03/2024   LDLDIRECT 32 01/03/2024   HDL 50 10/07/2024   TRIG 301 (H) 10/07/2024   TRIG 255 (H) 04/27/2024   TRIG 82 01/03/2024   ALT 24 10/07/2024   ALT 31 04/27/2024   AST 29 10/07/2024   AST 30 04/27/2024     Chemistry      Component Value Date/Time   NA 137 10/07/2024 1214   K 4.4 10/07/2024 1214   CL 95 (L) 10/07/2024 1214   CO2 28 10/07/2024 1214   BUN 18 10/07/2024 1214   CREATININE 0.93 10/07/2024 1214      Component Value Date/Time   CALCIUM  9.9 10/07/2024 1214   ALKPHOS 60 10/07/2024 1214   AST 29 10/07/2024 1214   ALT 24 10/07/2024 1214   BILITOT 0.4 10/07/2024 1214     The 10-year ASCVD risk score (Arnett DK, et al., 2019) is: 14.7%  Assessment and Plan:   1. Diabetes, type 1: uncontrolled per last A1c of 8.3%  (11/7), increased from previous, 7.4%. Goal <7% without hypoglycemia. Patient feels her sugars are back on track which is consistent with CGM report today showing GMI 7.4%. Is interested in once-daily basal which was discussed as below (can evaluate new insurance coverage in the new year). A1c seemingly driven by PP hyperglycemia, primarily late-night (1am an on), as well as afternoon meal intermittently. 2 lows noted though not a basal issue, related to correctional dosing after meal.   TDD: 90 basal, 65-75 prandial = ~160 u/d on average ISF: 1800/160, 1700/160 = 10-11 (1 unit of insulin  ??BG 10-11 mg/dL) -- currently following ISF15 though only correcting down to 160. Re-evaluate if tighter PP control needed on CGM report.  ICR: 500/160 = 3.125 (1 unit insulin  per every ~3g carb)  Current Regimen: Lantus  30am, 60pm. Humalog  prior to meals/snacks (using ISF 15 to  correct down to 160 before meals/snacks + ICR 1:3) HCM: UACR due. Eye exam not in chart though up to date Oct 2025.  Continue medications today without changes Main goal today = continue working on mealtime insulin  before meals rather than chasing the highs after meals as to prevent risk of lows.  Determined no signal alarm is 2/2 closing out of Dexcom app (must remain running in the background).   Jan 2026 Considerations: Basal Consideration: Asks about other basal insulin  types as she has friend who is on once-daily basal injection.  Reasonable to consider longer-acting per patient's preference in the new year. Discussed differences of once-daily vs Lantus . Injection would be once-daily and would no longer be split. Currently with larger dose in evening though hyperglycemia overnight and into morning tends to be isolated to late-night snacking which can be addressed with lifestyle and bolus adjustments.  With her total daily dose of basal, would benefit from concentrated which would improve absorption but also would allow each pen to  last longer (e.g Tresiba U200 or Toujeo  U300). Defer until insurance formulary 2026 is known.     2. Medication Access: New health plan = [place] [employer] Cou[employer] plan through her employer. Does not have plan details yet. Will reach out via Mychart once she has name of healthplan for next year.  PharmD f/u 2 weeks to ensure all meds are covered for 2026  Dexcom may require prior-auth    3. Healthcare Maintenance:  Pneumococcal - Current status: Up to date, PCV20 08/2024 Shingles - Current status: No record. Discussed with patient. She will check price with pharmacy Influenza - Current status: Up to date.   Due to receive the following vaccines: Tdap, Shingrix, and Covid Booster   Follow Up Scheduling Notes: Standard work hours 6am - 2:30 pm (free any day after 2:30pm, virtual/phone preferred) Follow up with clinical pharmacist via phone in 2 weeks for insurance benefit review Patient given direct line for questions regarding medication therapy  Future Appointments  Date Time Provider Department Center  11/17/2024  8:50 AM Abigail, Bernardino HERO, PA-C [facility] None  11/30/2024  3:00 PM [facility] PHARMACIST [facility] [address]  12/07/2024 11:00 AM [facility]  12/08/2024 11:00 AM Glendia Shad, MD [facility] [address] Manuelita FABIENE Geronimo, PharmD Clinical Pharmacist [facility] [phone]

## 2024-11-16 NOTE — Patient Instructions (Signed)
 Ms. Jill Crosby,   It was a pleasure to speak with you today! As we discussed:?   Medications Continue insulins as you have been taking them.  Continue to work on your goal of insulin  prior to meals. Chasing the highs/treating with correctional can increase risk of low sugars where this risk is less when given prior to meals.   We also discussed different types of long-acting (basal) insulin .  Lantus  is a good tried-and-true insulin . It can tend to wear off before the next dose is due which is why many patients use it twice daily.  There are several long-acting insulin  options that do work for a full 24+ hours per day and are therefore given once daily.  Some also come in the option of being concentrated which can help your body absorb the medicine better, especially when your doses are over 20-30 units, and tends to help with not using up pens so quickly (as the concentrated forms have more insulin  in each pen, they will last twice as long!)  Any long-acting insulin  option is very reasonable as they all do the same thing ultimately.  We can certainly look at your new insurance formulary to see which insulin  options are preferred in 2026.    Dexcom We discussed the Dexcom signal loss alarm is often triggered when the app is closed. Dexcom should be running in the background to avoid these notifications.    Insurance Once you have your new insurance card (or the full name of the insurance plan), feel free to send my way either via MyChart or you may leave me a voicemail. We will double check your medication benefits for 2026, and also can work on getting the Dexcom covered.    Please reach out prior to your next scheduled appointment should you have any questions or concerns.  You may reach me via MyChart Message, or you may leave me a voicemail at [phone] and I will get back to you shortly.   Thank you!   Future Appointments  Date Time Provider Department Center   11/17/2024  8:50 AM Abigail Bernardino HERO, PA-C [facility] None  11/30/2024  3:00 PM [facility] PHARMACIST [facility] [address]  12/07/2024 11:00 AM [facility]  12/08/2024 11:00 AM Glendia Shad, MD [facility] [address] Manuelita FABIENE Geronimo, PharmD Clinical Pharmacist [facility]    [place] [place]  [phone]

## 2024-11-17 ENCOUNTER — Ambulatory Visit: Admitting: Physician Assistant

## 2024-11-17 ENCOUNTER — Other Ambulatory Visit: Payer: Self-pay | Admitting: Internal Medicine

## 2024-11-17 ENCOUNTER — Encounter: Payer: Self-pay | Admitting: Physician Assistant

## 2024-11-17 VITALS — BP 116/62 | HR 63 | Ht 64.25 in | Wt 273.0 lb

## 2024-11-17 DIAGNOSIS — Z6841 Body Mass Index (BMI) 40.0 and over, adult: Secondary | ICD-10-CM | POA: Diagnosis not present

## 2024-11-17 DIAGNOSIS — I272 Pulmonary hypertension, unspecified: Secondary | ICD-10-CM | POA: Diagnosis not present

## 2024-11-17 DIAGNOSIS — E66813 Obesity, class 3: Secondary | ICD-10-CM

## 2024-11-17 DIAGNOSIS — G4733 Obstructive sleep apnea (adult) (pediatric): Secondary | ICD-10-CM

## 2024-11-17 DIAGNOSIS — L02419 Cutaneous abscess of limb, unspecified: Secondary | ICD-10-CM

## 2024-11-17 DIAGNOSIS — I2510 Atherosclerotic heart disease of native coronary artery without angina pectoris: Secondary | ICD-10-CM

## 2024-11-17 DIAGNOSIS — Z79899 Other long term (current) drug therapy: Secondary | ICD-10-CM

## 2024-11-17 DIAGNOSIS — E785 Hyperlipidemia, unspecified: Secondary | ICD-10-CM | POA: Diagnosis not present

## 2024-11-17 DIAGNOSIS — I10 Essential (primary) hypertension: Secondary | ICD-10-CM

## 2024-11-17 DIAGNOSIS — I5032 Chronic diastolic (congestive) heart failure: Secondary | ICD-10-CM

## 2024-11-17 MED ORDER — DOXYCYCLINE HYCLATE 100 MG PO TABS: 100.0000 mg | ORAL_TABLET | Freq: Two times a day (BID) | ORAL | 0 refills | Status: AC

## 2024-11-17 NOTE — Patient Instructions (Signed)
 Medication Instructions:  Your physician recommends the following medication changes.  START TAKING: Doxycycline  100 mg twice daily for 7 days Diflucan  150 mg by mouth for one dose (after you complete doxycycline )   You may take an additional Torsemide  20 mg as needed for swelling   *If you need a refill on your cardiac medications before your next appointment, please call your pharmacy*  Lab Work: Your provider would like for you to have following labs drawn today CBC, BMP.     Testing/Procedures: No test ordered today   Follow-Up: At [facility], you and your health needs are our priority.  As part of our continuing mission to provide you with exceptional heart care, our providers are all part of one team.  This team includes your primary Cardiologist (physician) and Advanced Practice Providers or APPs (Physician Assistants and Nurse Practitioners) who all work together to provide you with the care you need, when you need it.  Your next appointment:   2 month(s)  Provider:   Bernardino Bring, PA-C

## 2024-11-18 ENCOUNTER — Ambulatory Visit: Payer: Self-pay | Admitting: Physician Assistant

## 2024-11-18 LAB — BASIC METABOLIC PANEL WITH GFR
BUN/Creatinine Ratio: 19 (ref 12–28)
BUN: 19 mg/dL (ref 8–27)
CO2: 24 mmol/L (ref 20–29)
Calcium: 10.4 mg/dL — ABNORMAL HIGH (ref 8.7–10.3)
Chloride: 99 mmol/L (ref 96–106)
Creatinine, Ser: 0.98 mg/dL (ref 0.57–1.00)
Glucose: 61 mg/dL — ABNORMAL LOW (ref 70–99)
Potassium: 4.3 mmol/L (ref 3.5–5.2)
Sodium: 143 mmol/L (ref 134–144)
eGFR: 64 mL/min/1.73

## 2024-11-18 LAB — CBC
Hematocrit: 37.1 % (ref 34.0–46.6)
Hemoglobin: 11.3 g/dL (ref 11.1–15.9)
MCH: 28.5 pg (ref 26.6–33.0)
MCHC: 30.5 g/dL — ABNORMAL LOW (ref 31.5–35.7)
MCV: 94 fL (ref 79–97)
Platelets: 282 x10E3/uL (ref 150–450)
RBC: 3.97 x10E6/uL (ref 3.77–5.28)
RDW: 14.4 % (ref 11.7–15.4)
WBC: 11 x10E3/uL — ABNORMAL HIGH (ref 3.4–10.8)

## 2024-11-18 NOTE — Progress Notes (Addendum)
 error

## 2024-11-18 NOTE — Progress Notes (Addendum)
 Last read by Barnie DELENA Marny Marval at 10:52AM on 11/18/2024.

## 2024-11-22 ENCOUNTER — Other Ambulatory Visit: Payer: Self-pay | Admitting: Internal Medicine

## 2024-11-22 DIAGNOSIS — E1065 Type 1 diabetes mellitus with hyperglycemia: Secondary | ICD-10-CM

## 2024-11-28 ENCOUNTER — Telehealth: Payer: Self-pay | Admitting: Physician Assistant

## 2024-11-28 NOTE — Telephone Encounter (Signed)
 [employer] called needs the [employer] forms that were faxed on 10/28 refaxed again. They are be faxed to [phone].

## 2024-11-28 NOTE — Telephone Encounter (Signed)
 Re-faxed forms to [employer].

## 2024-11-30 ENCOUNTER — Other Ambulatory Visit (INDEPENDENT_AMBULATORY_CARE_PROVIDER_SITE_OTHER): Admitting: Pharmacist

## 2024-11-30 DIAGNOSIS — E1065 Type 1 diabetes mellitus with hyperglycemia: Secondary | ICD-10-CM

## 2024-11-30 NOTE — Patient Instructions (Signed)
 Ms. Jill Crosby,   It was a pleasure to speak with you today! As we discussed:?   Continue your current insulin  regimen as you have been taking.  However, we discussed trying 1 to 2 less units of Humalog  at meals you notice a pattern of dropping low after. From your Dexcom report, it seems like most commonly, the lows occur after your sugar is higher than usual, in the 200s or so (so maybe just slightly over-correcting or slightly over-estimating the carb content with the larger late-night snacks or christmas sweets for example). When your after-meal sugar is in a more normal range, I am not seeing the lows occur as often, so generally speaking, the Humalog  doses you are using are great! Just start to note when you do get low alarms, how much Humalog  did you use at the prior meal.      Please reach out prior to your next scheduled appointment should you have any questions or concerns.  You may reach me via MyChart Message, or you may leave me a voicemail at [phone] and I will get back to you shortly.   Thank you!   Future Appointments  Date Time Provider Department Center  12/07/2024 11:00 AM [facility]  12/08/2024 11:00 AM Glendia Shad, MD [facility] [address]  12/20/2024  3:00 PM [facility] PHARMACIST [facility] [address]  01/18/2025 10:55 AM Dunn, Bernardino HERO, PA-C [facility] None    Manuelita FABIENE Kobs, PharmD, BCACP, CPP Clinical Pharmacist Practitioner [facility] Ph: [phone]

## 2024-11-30 NOTE — Progress Notes (Signed)
 "  11/30/2024 Name: Jill Crosby MRN: [id number] DOB: 12/10/1960  Reason for Call: Confirm Insurance benefit 2026  [employer] [place] (commercial insurance through employer, Chief Operating Officer) Has a separate prescription card through [employer]: OptumRxBETHA ALLY: [id number] PCN: [id number] Rx ID: [id number] Issuer ID ([id number]): [id number] GRP: [id number]    Reported DM Regimen: ?  Lantus  30 units AM, 60 units HS Humalog  prior to snacks and meals (ICR1:3 + ISF1:15 down to 160)  Since last visit, notes she has significantly improved the timing of her prandial insulin  (giving prior to meals rather than after meals). Notes sugars have been generally well controlled aside from isolated excursions around the [place].  SMBG: Dexcom G7 (Linked to clarity) CGM Report (Date Range 12/18 - 12/31) ABG 167 mg/dL; GMI 2.6%; Variability 30.5%; Time Active: 97%; TIR 62%, high 30%, very high 7%, low 1%, very low 0%      Assessment and Plan:   1. Diabetes, type 1: uncontrolled per last A1c of 8.3% (11/7) with ongoing movement in GMI 7.3%.  Notes improvement in prandial insulin  timing over the last couple of weeks (all given prior to meals rather than after meals). Still with occasional lows (isolated post-prandial, primarily only after meals with very high spikes in the 200s). Overestimating prandial insulin  requirement with larger-than-usual meals/snacks. Most days, appropriate PP response though when sugars >200, tend to see lows several hours after.   TDD: 90 basal, 65-75 prandial = ~160 u/d on average ISF: 1800/160, 1700/160 = 10-11 (1 unit of insulin  ??BG 10-11 mg/dL) -- currently following ISF15 though only correcting down to 160. Re-evaluate if tighter PP control needed on CGM report.  ICR: 500/160 = 3.125 (1 unit insulin  per every ~3g carb)  Current Regimen: Lantus  30am, 60pm. Humalog  prior to meals/snacks (using ISF 15 to correct down to 160 before meals/snacks + ICR 1:3) HCM: UACR due. Eye exam not in chart though up  to date Oct 2025.  Continue medications today without changes Main goal today =  Overestimating carbs for larger meals/excursions. Trial 1 to 2 less units than currently using with late-night snacking (should lower sugar less by 11-22 mg/dL per ISF). Keep low alarm set to 80.  Ongoing goal: continue focus on prandial injections before meals.   Jan 2026 Considerations: Basal Consideration: Asks about other basal insulin  types as she has friend who is on once-daily basal injection.  Reasonable to consider longer-acting per patient's preference in the new year. Discussed differences of once-daily vs Lantus . Injection would be once-daily and would no longer be split. Currently with larger dose in evening though hyperglycemia overnight and into morning tends to be isolated to late-night snacking which can be addressed with lifestyle and bolus adjustments.  With her total daily dose of basal, would benefit from concentrated which would improve absorption but also would allow each pen to last longer (e.g Tresiba U200 or Toujeo  U300). Defer until insurance formulary 2026 is known.     Follow Up Scheduling Notes: Standard work hours 6am - 2:30 pm (free any day after 2:30pm, virtual/phone preferred) Follow up with clinical pharmacist via phone in 2 weeks for insurance benefit review Patient given direct line for questions regarding medication therapy  Future Appointments  Date Time Provider Department Center  11/30/2024  3:00 PM [facility] [place] PHARMACIST [facility] [address]  12/07/2024 11:00 AM [facility]  12/08/2024 11:00 AM Glendia Shad, MD [facility] [address]  01/18/2025 10:55 AM Abigail, Bernardino HERO, PA-C [facility] None   Manuelita SAUNDERS.  Geronimo, PharmD Clinical Pharmacist [facility] [phone]  "

## 2024-12-07 ENCOUNTER — Ambulatory Visit
Admission: RE | Admit: 2024-12-07 | Discharge: 2024-12-07 | Disposition: A | Discharge status: Home / Self Care | Source: Ambulatory Visit | Attending: Internal Medicine | Admitting: Internal Medicine

## 2024-12-07 DIAGNOSIS — Z1231 Encounter for screening mammogram for malignant neoplasm of breast: Secondary | ICD-10-CM | POA: Diagnosis present

## 2024-12-08 ENCOUNTER — Ambulatory Visit: Admitting: Internal Medicine

## 2024-12-08 ENCOUNTER — Encounter: Payer: Self-pay | Admitting: Internal Medicine

## 2024-12-08 VITALS — BP 140/80 | HR 65 | Temp 97.9°F | Ht 64.0 in | Wt 271.6 lb

## 2024-12-08 DIAGNOSIS — I10 Essential (primary) hypertension: Secondary | ICD-10-CM

## 2024-12-08 DIAGNOSIS — Z860100 Personal history of colon polyps, unspecified: Secondary | ICD-10-CM

## 2024-12-08 DIAGNOSIS — R600 Localized edema: Secondary | ICD-10-CM | POA: Diagnosis not present

## 2024-12-08 DIAGNOSIS — E1065 Type 1 diabetes mellitus with hyperglycemia: Secondary | ICD-10-CM

## 2024-12-08 DIAGNOSIS — I272 Pulmonary hypertension, unspecified: Secondary | ICD-10-CM | POA: Diagnosis not present

## 2024-12-08 DIAGNOSIS — I5032 Chronic diastolic (congestive) heart failure: Secondary | ICD-10-CM

## 2024-12-08 DIAGNOSIS — F419 Anxiety disorder, unspecified: Secondary | ICD-10-CM

## 2024-12-08 DIAGNOSIS — I2510 Atherosclerotic heart disease of native coronary artery without angina pectoris: Secondary | ICD-10-CM | POA: Diagnosis not present

## 2024-12-08 DIAGNOSIS — D649 Anemia, unspecified: Secondary | ICD-10-CM

## 2024-12-08 DIAGNOSIS — E7800 Pure hypercholesterolemia, unspecified: Secondary | ICD-10-CM

## 2024-12-08 DIAGNOSIS — Z0000 Encounter for general adult medical examination without abnormal findings: Secondary | ICD-10-CM

## 2024-12-08 LAB — HM DIABETES FOOT EXAM

## 2024-12-08 MED ORDER — LISINOPRIL 40 MG PO TABS: 40.0000 mg | ORAL_TABLET | Freq: Every day | ORAL | 1 refills | Status: AC

## 2024-12-08 MED ORDER — SERTRALINE HCL 50 MG PO TABS: 50.0000 mg | ORAL_TABLET | Freq: Every day | ORAL | 1 refills | Status: AC

## 2024-12-08 MED ORDER — BD PEN NEEDLE NANO ULTRAFINE 32G X 4 MM MISC: 0 refills | Status: AC

## 2024-12-08 NOTE — Assessment & Plan Note (Signed)
 Physical today 12/08/24.  Had mammogram yesterday. Results pending.  Colonoscopy 01/2021 (Dr Jinny).  Recommended f/u colonoscopy in 5 years   PAP ?

## 2024-12-08 NOTE — Progress Notes (Unsigned)
 "  Subjective:    Patient ID: Jill Crosby, female    DOB: 04/08/1961, 64 y.o.   MRN: [id number]  Patient here for  Chief Complaint  Patient presents with   Medical Management of Chronic Issues    HPI Here for a physical exam. Saw cardiology - 11/17/24 - stable. Continue aspirin  and plavix  for minimum of 6 months (date of PCI 09/13/24). Continue torsemide  20mg  daily. Saw Dr Mardee. Has bone on bone. Recommended no increased walking. Is ambulating with walker. Has history of HFpEF/pulmonary hypertension and lower extremity edema: Volume status is improving. Treatment for sleep apnea.    Past Medical History:  Diagnosis Date   Anxiety    Arthritis    Arthritis of knee    left knee   Depression    Elevated BP    Heart murmur    Hypercholesterolemia    Hypertension    Increased BMI    Menopause    Morbid obesity (HCC)    Oxygen deficiency    Sleep apnea    Type I diabetes mellitus (HCC)    Past Surgical History:  Procedure Laterality Date   BREAST BIOPSY Left 05/09/2020   ribbon clip, stereo bx, HEMANGIOMA, BENIGN. - MAMMARY EPITHELIUM IS NOT PRESENT.   CATARACT EXTRACTION W/PHACO Left 11/08/2020   Procedure: CATARACT EXTRACTION PHACO AND INTRAOCULAR LENS PLACEMENT (IOC) LEFT DIABETIC 4.14 00:52.1 ;  Surgeon: Jaye Fallow, MD;  Location: [facility];  Service: Ophthalmology;  Laterality: Left;   CATARACT EXTRACTION W/PHACO Right 12/04/2020   Procedure: CATARACT EXTRACTION PHACO AND INTRAOCULAR LENS PLACEMENT (IOC) RIGHT DIABETIC 6.11 00:46.1;  Surgeon: Jaye Fallow, MD;  Location: [facility];  Service: Ophthalmology;  Laterality: Right;  Diabetic - insulin    CESAREAN SECTION  1997   COLONOSCOPY WITH PROPOFOL  N/A 01/18/2021   Procedure: COLONOSCOPY WITH PROPOFOL ;  Surgeon: Jinny Carmine, MD;  Location: [facility];  Service: Endoscopy;  Laterality: N/A;  priority 4   CORONARY STENT INTERVENTION N/A 09/13/2024   Procedure: CORONARY STENT  INTERVENTION;  Surgeon: Mady Bruckner, MD;  Location: [facility];  Service: Cardiovascular;  Laterality: N/A;   EYE SURGERY     RIGHT/LEFT HEART CATH AND CORONARY ANGIOGRAPHY Bilateral 09/13/2024   Procedure: RIGHT/LEFT HEART CATH AND CORONARY ANGIOGRAPHY;  Surgeon: Mady Bruckner, MD;  Location: [facility];  Service: Cardiovascular;  Laterality: Bilateral;   Family History  Problem Relation Age of Onset   Hypertension Mother    Hyperlipidemia Mother    Colon polyps Mother    Cancer Father        lung   Colon cancer Maternal Grandfather    Diabetes Maternal Grandfather    Breast cancer Maternal Grandmother    Colon cancer Paternal Grandfather    Breast cancer Maternal Aunt    Heart disease Neg Hx    Ovarian cancer Neg Hx    Social History   Socioeconomic History   Marital status: Single    Spouse name: Not on file   Number of children: Not on file   Years of education: Not on file   Highest education level: 12th grade  Occupational History   Not on file  Tobacco Use   Smoking status: Former    Current packs/day: 0.00    Average packs/day: 0.5 packs/day for 20.0 years (10.0 ttl pk-yrs)    Types: Cigarettes    Quit date: 01/03/2024    Years since quitting: 0.9   Smokeless tobacco: Never  Substance and Sexual  Activity   Alcohol use: Never   Drug use: Never   Sexual activity: Not Currently    Birth control/protection: Abstinence, Post-menopausal  Other Topics Concern   Not on file  Social History Narrative   Not on file   Social Drivers of Health   Tobacco Use: Medium Risk (12/08/2024)   Patient History    Smoking Tobacco Use: Former    Smokeless Tobacco Use: Never    Passive Exposure: Not on file  Financial Resource Strain: Low Risk (10/03/2024)   Overall Financial Resource Strain (CARDIA)    Difficulty of Paying Living Expenses: Not very hard  Food Insecurity: No Food Insecurity (10/03/2024)   Epic    Worried About Programme Researcher, Broadcasting/film/video in  the Last Year: Never true    Ran Out of Food in the Last Year: Never true  Transportation Needs: No Transportation Needs (10/03/2024)   Epic    Lack of Transportation (Medical): No    Lack of Transportation (Non-Medical): No  Physical Activity: Inactive (10/03/2024)   Exercise Vital Sign    Days of Exercise per Week: 0 days    Minutes of Exercise per Session: Not on file  Stress: Stress Concern Present (10/03/2024)   [employer] of Occupational Health - Occupational Stress Questionnaire    Feeling of Stress: To some extent  Social Connections: Moderately Integrated (10/03/2024)   Social Connection and Isolation Panel    Frequency of Communication with Friends and Family: More than three times a week    Frequency of Social Gatherings with Friends and Family: Twice a week    Attends Religious Services: 1 to 4 times per year    Active Member of Clubs or Organizations: Yes    Attends Banker Meetings: 1 to 4 times per year    Marital Status: Divorced  Depression (PHQ2-9): Low Risk (10/07/2024)   Depression (PHQ2-9)    PHQ-2 Score: 1  Alcohol Screen: Low Risk (05/12/2024)   Alcohol Screen    Last Alcohol Screening Score (AUDIT): 0  Housing: Low Risk (10/03/2024)   Epic    Unable to Pay for Housing in the Last Year: No    Number of Times Moved in the Last Year: 0    Homeless in the Last Year: No  Utilities: Not At Risk (09/13/2024)   Epic    Threatened with loss of utilities: No  Health Literacy: Not on file     Review of Systems     Objective:     BP (!) 140/80   Pulse 65   Temp 97.9 F (36.6 C) (Oral)   Ht 5' 4 (1.626 m)   Wt 271 lb 9.6 oz (123.2 kg)   SpO2 95%   BMI 46.62 kg/m  Wt Readings from Last 3 Encounters:  12/08/24 271 lb 9.6 oz (123.2 kg)  11/17/24 273 lb (123.8 kg)  10/07/24 267 lb 12 oz (121.5 kg)    Physical Exam  {Perform Simple Foot Exam  Perform Detailed exam:1} {Insert foot Exam (Optional):30965}   Outpatient Encounter  Medications as of 12/08/2024  Medication Sig   aspirin  81 MG chewable tablet Chew 1 tablet (81 mg total) by mouth daily.   BAYER MICROLET LANCETS lancets USE AS DIRECTED TO CHECK  SUGARS 1 TO 2 TIMES A DAY   carvedilol  (COREG ) 12.5 MG tablet Take 1 tablet (12.5 mg total) by mouth 2 (two) times daily.   cholecalciferol (VITAMIN D3) 25 MCG (1000 UT) tablet Take 1,000 Units by mouth daily.  clopidogrel  (PLAVIX ) 75 MG tablet Take 1 tablet (75 mg total) by mouth daily with breakfast.   Continuous Glucose Sensor (DEXCOM G7 SENSOR) MISC Place new sensor every 10 days   CONTOUR NEXT TEST test strip USE TO CHECK BLOOD GLUCOSE 6 TO  7 TIMES DAILY   Cyanocobalamin  (B-12 PO) Take 2,000 mcg by mouth daily.   insulin  glargine (LANTUS  SOLOSTAR) 100 UNIT/ML Solostar Pen INJECT SUBCUTANEOUSLY 60 UNITS AT NIGHT AND 30  UNITS IN MORNING   insulin  lispro (HUMALOG  KWIKPEN) 100 UNIT/ML KwikPen INJECT SUBCUTANEOUSLY 75 UNITS  DAILY (DISTRIBUTED OVER THREE  MEALS COUNTING CARBS)   Insulin  Pen Needle (BD PEN NEEDLE NANO ULTRAFINE) 32G X 4 MM MISC USE AS DIRECTED WITH INSULIN  4  TIMES DAILY   levocetirizine (XYZAL ) 5 MG tablet TAKE 1 TABLET BY MOUTH IN THE  EVENING (Patient taking differently: Take 5 mg by mouth daily as needed for allergies.)   lisinopril  (ZESTRIL ) 40 MG tablet TAKE 1 TABLET BY MOUTH DAILY   potassium chloride  (KLOR-CON ) 10 MEQ tablet One tablet daily, coinciding with torsemide  dosing   REPATHA  SURECLICK 140 MG/ML SOAJ INJECT 1 PEN SUBCUTANEOUSLY  EVERY 2 WEEKS   sertraline  (ZOLOFT ) 50 MG tablet TAKE 1 TABLET BY MOUTH DAILY   torsemide  (DEMADEX ) 20 MG tablet Take 1 tablet (20 mg total) by mouth daily. May take an additional 20 mg as needed for swelling   No facility-administered encounter medications on file as of 12/08/2024.     Lab Results  Component Value Date   WBC 11.0 (H) 11/17/2024   HGB 11.3 11/17/2024   HCT 37.1 11/17/2024   PLT 282 11/17/2024   GLUCOSE 61 (L) 11/17/2024   CHOL 156  10/07/2024   TRIG 301 (H) 10/07/2024   HDL 50 10/07/2024   LDLDIRECT 32 01/03/2024   LDLCALC 59 10/07/2024   ALT 24 10/07/2024   AST 29 10/07/2024   NA 143 11/17/2024   K 4.3 11/17/2024   CL 99 11/17/2024   CREATININE 0.98 11/17/2024   BUN 19 11/17/2024   CO2 24 11/17/2024   TSH 2.010 04/27/2024   HGBA1C 8.3 (H) 10/07/2024    No results found.     Assessment & Plan:  Health care maintenance Assessment & Plan: Physical today 12/08/24.  Had mammogram yesterday. Results pending.  Colonoscopy 01/2021 (Dr Jinny).  Recommended f/u colonoscopy in 5 years   PAP ?   Type 1 diabetes mellitus with hyperglycemia (HCC)     Allena Hamilton, MD "

## 2024-12-11 ENCOUNTER — Encounter: Payer: Self-pay | Admitting: Internal Medicine

## 2024-12-11 NOTE — Assessment & Plan Note (Signed)
 Continue zoloft.  Stable.

## 2024-12-11 NOTE — Assessment & Plan Note (Signed)
 Swelling improved. Continue f/u with cardiology.

## 2024-12-11 NOTE — Assessment & Plan Note (Signed)
 With associated diabetes and hypertension - low carb diet and exercise. Jill Crosby

## 2024-12-11 NOTE — Assessment & Plan Note (Signed)
 Seeing cardiology. Volume status is improving. Treat sleep apnea.

## 2024-12-11 NOTE — Assessment & Plan Note (Signed)
Colonoscopy 01/2021 - normal (Dr Allen Norris).  Recommended f/u colonoscopy in 5 years.

## 2024-12-11 NOTE — Assessment & Plan Note (Signed)
 Continue carvedilol , lisinopril  and toresemide 20mg  q day. Follow pressures. Follow metabolic panel.

## 2024-12-11 NOTE — Assessment & Plan Note (Signed)
 S/p stent to mid LAD 09/13/24. Continue f/u with cardiology. Continue statin medication and DAPT with aspirin  and plavix . Continue risk factor modification. Stable.

## 2024-12-11 NOTE — Assessment & Plan Note (Signed)
 Has history of HFpEF/pulmonary hypertension and lower extremity edema: RHC earlier this month showed mildly elevated left heart filling pressure, moderately elevated artery pressure, and severely elevated right heart filling pressure. Volume status has improved. Taking tosemide. Follow.

## 2024-12-11 NOTE — Assessment & Plan Note (Signed)
 Hgb 09/27/24 - wnl.

## 2024-12-11 NOTE — Assessment & Plan Note (Signed)
 On repatha .  Low cholesterol diet and exercise.  Follow lipid panel.  Lab Results  Component Value Date   CHOL 156 10/07/2024   HDL 50 10/07/2024   LDLCALC 59 10/07/2024   LDLDIRECT 32 01/03/2024   TRIG 301 (H) 10/07/2024   CHOLHDL 3.1 10/07/2024

## 2024-12-11 NOTE — Assessment & Plan Note (Addendum)
 Reviewed CGM - 11/25/24 - 12/08/24 - time active 96% with 60% target, 33% high, 6% very high, 1% ow and <1% very low. Continues insulin  regimen as outlined monitor carb intake. Continue to monitor sugars.

## 2024-12-13 ENCOUNTER — Other Ambulatory Visit: Payer: Self-pay | Admitting: Internal Medicine

## 2024-12-20 ENCOUNTER — Other Ambulatory Visit

## 2024-12-20 DIAGNOSIS — E1065 Type 1 diabetes mellitus with hyperglycemia: Secondary | ICD-10-CM

## 2024-12-20 NOTE — Progress Notes (Unsigned)
 "  12/22/2024 Name: Jill Crosby MRN: [id number] DOB: 09-26-1961  Subjective  Chief Complaint  Patient presents with   Diabetes   Reason for visit: ?  Jill Crosby is a 64 y.o. female with a history of diabetes (type 1), who presents today for a follow up diabetes pharmacotherapy visit to review first week of data with new CGM device. They were referred by their PCP for management of diabetes.   Pertinent PMH also includes HTN, HFpEF, CAD s/p stent placement, PAH, T1D, HLD, Anxiety, hx tobacco use.  Care Team: Primary Care Provider: Glendia Shad, MD   Date of Last Diabetes Related Visit: with PCP on 10/07/24; With PharmD 12/9 Recent summary of change 12/9: New CGM. Glucometer > Dexcom G7. [facility] to [facility]  Medication Access/Adherence: Prescription drug coverage: YES - Cigna 2025 > BCBS [place] 2026 Hopes to work for 2 more years, to age ~35.  - Reports that all medications are affordable.       - $60/month for brand (Repatha )      - All testing supplies covered through work diabetes program for $0 - Medication Adherence: Patient denies missing doses of their medication.    HPI:  Diagnosed at age 50. Has seen a few endocrinologists. Historically has tried a few different pumps. Notes she initially got her first pump when trying to conceive her first child. Did well with this. Had another child 7 years later with another successful pump experience. However, notes every time after her pump warrantly was up, her pump would stop working. Got fed up with pumps after the ~4th pump and has been managing with injections since. Injections remain her preference at present time.   Since Last visit: ?  Patient reports doing well over the past couple of weeks.  Changed her low alarm back to 70 from 80. Likes to treat around 71 which has been useful for her. High alarms remain off. No longer having issues with signal loss alarm (has stopped closing out of apps).   Has been more  mindful of carb estimates, per c/f occasional post-meal hypoglycemia reported last visit. No longer experience post-meal alarms/lows.   Unrelated, patient questioning legitimacy of email through her employer regarding insurance benefits. Sees [facility]. BCBS requiring [employer] for joint replacement through new insurance company. No OOP expenses with use of [employer]um (insurance pays for transportation/hotels/meals, etc.).    Reported DM Regimen: ?  Lantus  30 units AM, 60 units HS Humalog  prior to snacks and meals (ICR1:3 + ISF1:15 down to 160)  Tests before meal. If elevated (~>160) gives enough to cover the high blood sugar and for the servings of carbs. Reports 5 units per 15 grams (ICR 1:3) ISF 1:15 (e.g. if 200 before lunch, will give enough to bring it down to 160)  SMBG: Dexcom G7 (Linked to [facility]) CGM Report (Date Range 1/7 - 1/20) ABG 173 mg/dL; GMI 2.5%; Variability 30.8%; Time Active: 100%; TIR 57%, high 34%, very high 8%, low 1%, very low <1% (reduced frequency over the past 7 days)     Hypo/Hyperglycemia: Symptoms of hypoglycemia since last visit:? x1 in the past week.   Symptoms of hyperglycemia since last visit:? no  Reported Diet: Patient typically eats regular meals with snacks between. Denies skipped meals.  Goal:  Sometimes eats without using insulin , especially since transition to work from home though feels this has improved since last visit.  Current area of difficulty: Football games / snacking. Hard to predict how much will  be eaten leading to larger PP peaks on 1-2 days of the week.   DM Prevention:  Statin: Not taking History of chronic kidney disease? no History of albuminuria? yes, last UACR on 03/24/23 = 11 mg/g (improved from 40mg ) ACE/ARB - Taking lisinopril  40mg /d; Urine MA/CR Ratio - <30 mg/g.  Last eye exam: October 2025; No retinopathy present Last foot exam: 12/08/2024 Tobacco Use: Former smoker  Immunizations:? Flu: Up to date (Last: 09/14/2024);  Pneumococcal: PCV20 (09/14/2024); Shingrix: No Record - DUE  Cardiovascular Risk Reduction History of clinical ASCVD? yes History of heart failure? yes History of hyperlipidemia? yes Current BMI: 45.8 kg/m2 (Ht 5' 4.25, Wt 121.5 kg) Taking statin? no (Intolerant, on PCSK9i) Taking aspirin ? indicated (secondary prevention); Taking   Taking SGLT-2i? no Taking GLP- 1 RA? no      _______________________________________________  Objective    Review of Systems:? Limited in the setting of virtual visit  GI:? No nausea, vomiting, constipation, diarrhea, abdominal pain, dyspepsia, change in bowel habits  Endocrine:? No polyuria, polyphagia or blurred vision   Physical Examination:  Vitals:  Wt Readings from Last 3 Encounters:  12/08/24 271 lb 9.6 oz (123.2 kg)  11/17/24 273 lb (123.8 kg)  10/07/24 267 lb 12 oz (121.5 kg)   BP Readings from Last 3 Encounters:  12/08/24 128/78  11/17/24 116/62  10/07/24 138/78   Pulse Readings from Last 3 Encounters:  12/08/24 65  11/17/24 63  10/07/24 67   Labs:?  Lab Results  Component Value Date   HGBA1C 8.3 (H) 10/07/2024   HGBA1C 7.4 (H) 04/27/2024   HGBA1C 6.9 (H) 01/04/2024   GLUCOSE 61 (L) 11/17/2024   MICRALBCREAT 11 03/24/2023   MICRALBCREAT 40 (H) 03/26/2020   MICRALBCREAT 10.5 09/15/2018   CREATININE 0.98 11/17/2024   CREATININE 0.93 10/07/2024   CREATININE 1.22 (H) 09/27/2024   Lab Results  Component Value Date   CHOL 156 10/07/2024   LDLCALC 59 10/07/2024   LDLCALC 31 04/27/2024   LDLCALC 36 01/03/2024   LDLDIRECT 32 01/03/2024   HDL 50 10/07/2024   TRIG 301 (H) 10/07/2024   TRIG 255 (H) 04/27/2024   TRIG 82 01/03/2024   ALT 24 10/07/2024   ALT 31 04/27/2024   AST 29 10/07/2024   AST 30 04/27/2024     Chemistry      Component Value Date/Time   NA 143 11/17/2024 0948   K 4.3 11/17/2024 0948   CL 99 11/17/2024 0948   CO2 24 11/17/2024 0948   BUN 19 11/17/2024 0948   CREATININE 0.98 11/17/2024 0948       Component Value Date/Time   CALCIUM  10.4 (H) 11/17/2024 0948   ALKPHOS 60 10/07/2024 1214   AST 29 10/07/2024 1214   ALT 24 10/07/2024 1214   BILITOT 0.4 10/07/2024 1214     The 10-year ASCVD risk score (Arnett DK, et al., 2019) is: 12.8%  Assessment and Plan:   1. Diabetes, type 1: reasonably controlled per CGM report today with GMI 7.3% over the last 14 days (notably, the past 7 days are improved from prior 7 days suggesting GMI should continue to improve if sustained).  A1c goal <7% if able to maintain w/o hypoglycemia. PP hyperglycema, primarily late-night (1am an on), though frequency improved compared to last visit. Feels she has been doing better with snacking. No further post-meal lows.  Overall, control is more stable today from both hyper- and hypoglycemia standpoint.  Remains interested in once-daily basal though recent was sent a Lantus  4-month refill  and is okay with continuing. No concerns with this given fair glycemic control with adequate basal coverage.  TDD: 90 basal, 65-75 prandial = ~160 u/d on average ISF: 1800/160, 1700/160 = 10-11 (1 unit of insulin  ??BG 10-11 mg/dL) -- currently following ISF15 though only correcting down to 160 which remains appropriate for now based on PPBG (may slightly overestimate carbs, so this balances out) ICR: 500/160 = 3.125 (1 unit insulin  per every 3g carb)  Current Regimen: Lantus  30am, 60pm. Humalog  prior to meals/snacks (using ISF 15 to correct down to 160 before meals/snacks + ICR 1:3) HCM: UACR due. Eye exam not in chart though up to date Oct 2025.  Continue medications today without changes  Continuing to learn effects of specific foods with her CGM. Goal to make mental note of snacks/foods while watching football. Likely would benefit from some insulin  prior to these snacks given excursions >300.. Will continue to discuss at next follow up once patterns established.    2. Healthcare Maintenance:  Pneumococcal - Current status:  Up to date, PCV20 08/2024 Shingles - Current status: No record. Discussed with patient. She will check price with pharmacy Influenza - Current status: Up to date.   Due to receive the following vaccines: Tdap, Shingrix, and Covid Booster   Follow Up Scheduling Notes: Standard work hours 6am - 2:30 pm (free any day after 2:30pm, virtual/phone preferred) Follow up with clinical pharmacist via phone in 4 weeks Patient given direct line for questions regarding medication therapy or blood sugars prior to next visit.   Future Appointments  Date Time Provider Department Center  01/18/2025 10:55 AM Abigail Bernardino HERO, PA-C [facility] None  01/24/2025  3:00 PM [facility] PHARMACIST [facility] [id number]  03/13/2025  1:30 PM Glendia Shad, MD [facility] [address] FABIENE Geronimo, PharmD Clinical Pharmacist [facility] [phone] "

## 2025-01-04 ENCOUNTER — Encounter: Payer: Self-pay | Admitting: Sleep Medicine

## 2025-01-18 ENCOUNTER — Ambulatory Visit: Payer: Self-pay | Admitting: Physician Assistant

## 2025-01-24 ENCOUNTER — Other Ambulatory Visit

## 2025-03-09 ENCOUNTER — Other Ambulatory Visit

## 2025-03-13 ENCOUNTER — Encounter: Admitting: Internal Medicine
